# Patient Record
Sex: Female | Born: 1935 | Race: White | Hispanic: No | Smoking: Never smoker
Health system: Southern US, Community
[De-identification: ages and names within clinical notes are randomized; demographics above are authoritative.]

## PROBLEM LIST (undated history)

## (undated) DIAGNOSIS — I341 Nonrheumatic mitral (valve) prolapse: Secondary | ICD-10-CM

## (undated) DIAGNOSIS — F419 Anxiety disorder, unspecified: Secondary | ICD-10-CM

## (undated) DIAGNOSIS — G629 Polyneuropathy, unspecified: Secondary | ICD-10-CM

## (undated) DIAGNOSIS — R918 Other nonspecific abnormal finding of lung field: Secondary | ICD-10-CM

## (undated) DIAGNOSIS — K219 Gastro-esophageal reflux disease without esophagitis: Secondary | ICD-10-CM

## (undated) DIAGNOSIS — R002 Palpitations: Secondary | ICD-10-CM

## (undated) DIAGNOSIS — I10 Essential (primary) hypertension: Secondary | ICD-10-CM

## (undated) DIAGNOSIS — K649 Unspecified hemorrhoids: Secondary | ICD-10-CM

## (undated) DIAGNOSIS — M722 Plantar fascial fibromatosis: Secondary | ICD-10-CM

## (undated) DIAGNOSIS — M1990 Unspecified osteoarthritis, unspecified site: Secondary | ICD-10-CM

## (undated) DIAGNOSIS — R739 Hyperglycemia, unspecified: Secondary | ICD-10-CM

## (undated) DIAGNOSIS — Z8719 Personal history of other diseases of the digestive system: Secondary | ICD-10-CM

## (undated) DIAGNOSIS — J301 Allergic rhinitis due to pollen: Secondary | ICD-10-CM

## (undated) DIAGNOSIS — H9190 Unspecified hearing loss, unspecified ear: Secondary | ICD-10-CM

## (undated) DIAGNOSIS — G709 Myoneural disorder, unspecified: Secondary | ICD-10-CM

## (undated) HISTORY — PX: FOOT SURGERY: SHX648

## (undated) HISTORY — DX: Unspecified hemorrhoids: K64.9

## (undated) HISTORY — DX: Myoneural disorder, unspecified: G70.9

## (undated) HISTORY — PX: CATARACT EXTRACTION, BILATERAL: SHX1313

## (undated) HISTORY — DX: Personal history of other diseases of the digestive system: Z87.19

## (undated) HISTORY — DX: Gastro-esophageal reflux disease without esophagitis: K21.9

## (undated) HISTORY — DX: Essential (primary) hypertension: I10

## (undated) HISTORY — DX: Allergic rhinitis due to pollen: J30.1

## (undated) HISTORY — DX: Hyperglycemia, unspecified: R73.9

## (undated) HISTORY — DX: Unspecified osteoarthritis, unspecified site: M19.90

## (undated) HISTORY — DX: Plantar fascial fibromatosis: M72.2

## (undated) HISTORY — PX: VESICOVAGINAL FISTULA CLOSURE W/ TAH: SUR271

## (undated) HISTORY — DX: Palpitations: R00.2

## (undated) HISTORY — DX: Nonrheumatic mitral (valve) prolapse: I34.1

---
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
## 1996-03-31 HISTORY — PX: ABDOMINAL HYSTERECTOMY: SHX81

## 1998-07-26 ENCOUNTER — Other Ambulatory Visit: Admission: RE | Admit: 1998-07-26 | Discharge: 1998-07-26 | Payer: Self-pay | Admitting: Podiatry

## 1998-08-16 ENCOUNTER — Other Ambulatory Visit: Admission: RE | Admit: 1998-08-16 | Discharge: 1998-08-16 | Payer: Self-pay | Admitting: Podiatry

## 2002-12-08 ENCOUNTER — Ambulatory Visit (HOSPITAL_COMMUNITY): Admission: RE | Admit: 2002-12-08 | Discharge: 2002-12-08 | Payer: Self-pay | Admitting: *Deleted

## 2003-01-05 ENCOUNTER — Encounter: Payer: Self-pay | Admitting: *Deleted

## 2003-01-05 ENCOUNTER — Ambulatory Visit (HOSPITAL_COMMUNITY): Admission: RE | Admit: 2003-01-05 | Discharge: 2003-01-05 | Payer: Self-pay | Admitting: *Deleted

## 2004-02-06 ENCOUNTER — Ambulatory Visit: Payer: Self-pay | Admitting: *Deleted

## 2004-02-06 ENCOUNTER — Ambulatory Visit: Payer: Self-pay

## 2004-04-02 ENCOUNTER — Ambulatory Visit: Payer: Self-pay | Admitting: *Deleted

## 2004-04-23 ENCOUNTER — Ambulatory Visit: Payer: Self-pay | Admitting: *Deleted

## 2004-06-19 ENCOUNTER — Ambulatory Visit: Payer: Self-pay | Admitting: *Deleted

## 2004-07-03 ENCOUNTER — Ambulatory Visit: Payer: Self-pay | Admitting: *Deleted

## 2004-09-04 ENCOUNTER — Ambulatory Visit: Payer: Self-pay | Admitting: *Deleted

## 2004-11-15 ENCOUNTER — Emergency Department (HOSPITAL_COMMUNITY): Admission: EM | Admit: 2004-11-15 | Discharge: 2004-11-15 | Payer: Self-pay | Admitting: Emergency Medicine

## 2004-11-21 ENCOUNTER — Encounter: Admission: RE | Admit: 2004-11-21 | Discharge: 2004-11-21 | Payer: Self-pay | Admitting: Orthopaedic Surgery

## 2005-03-12 ENCOUNTER — Ambulatory Visit: Payer: Self-pay | Admitting: *Deleted

## 2005-03-13 ENCOUNTER — Ambulatory Visit: Payer: Self-pay | Admitting: *Deleted

## 2005-05-21 ENCOUNTER — Ambulatory Visit: Payer: Self-pay | Admitting: *Deleted

## 2005-09-16 ENCOUNTER — Ambulatory Visit: Payer: Self-pay | Admitting: *Deleted

## 2005-09-17 ENCOUNTER — Ambulatory Visit: Payer: Self-pay | Admitting: *Deleted

## 2006-04-20 ENCOUNTER — Ambulatory Visit: Payer: Self-pay | Admitting: *Deleted

## 2006-04-21 ENCOUNTER — Ambulatory Visit: Payer: Self-pay | Admitting: *Deleted

## 2006-04-21 LAB — CONVERTED CEMR LAB
AST: 19 units/L (ref 0–37)
Albumin: 3.7 g/dL (ref 3.5–5.2)
Alkaline Phosphatase: 60 units/L (ref 39–117)
CO2: 31 meq/L (ref 19–32)
Creatinine, Ser: 0.8 mg/dL (ref 0.4–1.2)
GFR calc Af Amer: 87 mL/min
Sodium: 141 meq/L (ref 135–145)
Total Bilirubin: 0.9 mg/dL (ref 0.3–1.2)
Triglycerides: 67 mg/dL (ref 0–149)
VLDL: 13 mg/dL (ref 0–40)

## 2006-07-08 ENCOUNTER — Ambulatory Visit: Payer: Self-pay | Admitting: Internal Medicine

## 2006-08-15 ENCOUNTER — Ambulatory Visit: Payer: Self-pay | Admitting: Family Medicine

## 2006-08-19 ENCOUNTER — Ambulatory Visit: Payer: Self-pay | Admitting: Internal Medicine

## 2006-08-25 ENCOUNTER — Ambulatory Visit: Payer: Self-pay | Admitting: Internal Medicine

## 2006-08-26 ENCOUNTER — Encounter: Admission: RE | Admit: 2006-08-26 | Discharge: 2006-08-26 | Payer: Self-pay | Admitting: Internal Medicine

## 2006-09-09 ENCOUNTER — Ambulatory Visit: Payer: Self-pay | Admitting: *Deleted

## 2006-09-10 ENCOUNTER — Ambulatory Visit: Payer: Self-pay | Admitting: *Deleted

## 2006-09-10 LAB — CONVERTED CEMR LAB
Albumin: 3.6 g/dL (ref 3.5–5.2)
Alkaline Phosphatase: 56 units/L (ref 39–117)
BUN: 17 mg/dL (ref 6–23)
GFR calc Af Amer: 121 mL/min
Potassium: 3.9 meq/L (ref 3.5–5.1)
Total CHOL/HDL Ratio: 3.3
Triglycerides: 111 mg/dL (ref 0–149)
VLDL: 22 mg/dL (ref 0–40)

## 2007-01-13 ENCOUNTER — Encounter: Payer: Self-pay | Admitting: *Deleted

## 2007-01-13 DIAGNOSIS — Z9889 Other specified postprocedural states: Secondary | ICD-10-CM | POA: Insufficient documentation

## 2007-01-13 DIAGNOSIS — Z9089 Acquired absence of other organs: Secondary | ICD-10-CM | POA: Insufficient documentation

## 2007-01-13 DIAGNOSIS — Z9079 Acquired absence of other genital organ(s): Secondary | ICD-10-CM | POA: Insufficient documentation

## 2007-01-13 DIAGNOSIS — E785 Hyperlipidemia, unspecified: Secondary | ICD-10-CM

## 2007-01-13 DIAGNOSIS — J301 Allergic rhinitis due to pollen: Secondary | ICD-10-CM | POA: Insufficient documentation

## 2007-01-13 DIAGNOSIS — Z9189 Other specified personal risk factors, not elsewhere classified: Secondary | ICD-10-CM

## 2007-01-13 DIAGNOSIS — Z8679 Personal history of other diseases of the circulatory system: Secondary | ICD-10-CM | POA: Insufficient documentation

## 2007-01-13 DIAGNOSIS — I10 Essential (primary) hypertension: Secondary | ICD-10-CM

## 2007-01-13 DIAGNOSIS — Z8719 Personal history of other diseases of the digestive system: Secondary | ICD-10-CM | POA: Insufficient documentation

## 2007-01-13 DIAGNOSIS — M129 Arthropathy, unspecified: Secondary | ICD-10-CM | POA: Insufficient documentation

## 2007-01-13 DIAGNOSIS — M722 Plantar fascial fibromatosis: Secondary | ICD-10-CM

## 2007-01-13 DIAGNOSIS — K219 Gastro-esophageal reflux disease without esophagitis: Secondary | ICD-10-CM

## 2007-01-19 ENCOUNTER — Emergency Department (HOSPITAL_COMMUNITY): Admission: EM | Admit: 2007-01-19 | Discharge: 2007-01-19 | Payer: Self-pay | Admitting: Emergency Medicine

## 2007-03-16 ENCOUNTER — Ambulatory Visit: Payer: Self-pay | Admitting: Internal Medicine

## 2007-03-16 DIAGNOSIS — S42209A Unspecified fracture of upper end of unspecified humerus, initial encounter for closed fracture: Secondary | ICD-10-CM | POA: Insufficient documentation

## 2007-09-16 ENCOUNTER — Ambulatory Visit: Payer: Self-pay | Admitting: Internal Medicine

## 2007-09-16 LAB — CONVERTED CEMR LAB
ALT: 21 units/L (ref 0–35)
Calcium: 9.5 mg/dL (ref 8.4–10.5)
Creatinine, Ser: 0.6 mg/dL (ref 0.4–1.2)
GFR calc non Af Amer: 100 mL/min
HDL: 59 mg/dL (ref >39.0)
LDL Cholesterol: 71 mg/dL (ref 0–99)
Total Bilirubin: 0.8 mg/dL (ref 0.3–1.2)
Total CHOL/HDL Ratio: 2.5
Triglycerides: 84 mg/dL (ref 0–149)

## 2007-09-17 ENCOUNTER — Encounter: Payer: Self-pay | Admitting: Internal Medicine

## 2007-09-17 ENCOUNTER — Telehealth (INDEPENDENT_AMBULATORY_CARE_PROVIDER_SITE_OTHER): Payer: Self-pay | Admitting: *Deleted

## 2007-10-06 ENCOUNTER — Telehealth: Payer: Self-pay | Admitting: Internal Medicine

## 2007-12-08 ENCOUNTER — Ambulatory Visit: Payer: Self-pay | Admitting: Internal Medicine

## 2007-12-08 DIAGNOSIS — S139XXA Sprain of joints and ligaments of unspecified parts of neck, initial encounter: Secondary | ICD-10-CM

## 2007-12-08 DIAGNOSIS — M5030 Other cervical disc degeneration, unspecified cervical region: Secondary | ICD-10-CM

## 2008-03-02 ENCOUNTER — Ambulatory Visit: Payer: Self-pay | Admitting: Internal Medicine

## 2008-03-02 LAB — CONVERTED CEMR LAB
CO2: 29 meq/L (ref 19–32)
GFR calc Af Amer: 121 mL/min
Glucose, Bld: 101 mg/dL — ABNORMAL HIGH (ref 70–99)
Potassium: 4.2 meq/L (ref 3.5–5.1)
Sodium: 136 meq/L (ref 135–145)

## 2008-03-09 ENCOUNTER — Telehealth: Payer: Self-pay | Admitting: Internal Medicine

## 2009-02-19 ENCOUNTER — Ambulatory Visit: Payer: Self-pay | Admitting: Internal Medicine

## 2009-02-19 LAB — CONVERTED CEMR LAB
ALT: 20 units/L (ref 0–35)
AST: 19 units/L (ref 0–37)
Albumin: 4.3 g/dL (ref 3.5–5.2)
CO2: 31 meq/L (ref 19–32)
Calcium: 9.2 mg/dL (ref 8.4–10.5)
Cholesterol: 151 mg/dL (ref 0–200)
GFR calc non Af Amer: 99.6 mL/min (ref >60)
HDL: 72 mg/dL (ref >39.00)
Sodium: 134 meq/L — ABNORMAL LOW (ref 135–145)
Total CHOL/HDL Ratio: 2
Total Protein: 7 g/dL (ref 6.0–8.3)
Triglycerides: 41 mg/dL (ref 0.0–149.0)

## 2009-08-23 ENCOUNTER — Encounter: Payer: Self-pay | Admitting: Internal Medicine

## 2009-09-04 ENCOUNTER — Ambulatory Visit: Payer: Self-pay | Admitting: Endocrinology

## 2009-09-04 DIAGNOSIS — N390 Urinary tract infection, site not specified: Secondary | ICD-10-CM

## 2009-09-04 LAB — CONVERTED CEMR LAB
Glucose, Urine, Semiquant: NEGATIVE
Ketones, urine, test strip: NEGATIVE
Urobilinogen, UA: 0.2
pH: 5

## 2010-02-14 ENCOUNTER — Ambulatory Visit: Payer: Self-pay | Admitting: Internal Medicine

## 2010-02-14 DIAGNOSIS — M25569 Pain in unspecified knee: Secondary | ICD-10-CM

## 2010-02-27 ENCOUNTER — Ambulatory Visit: Payer: Self-pay | Admitting: Internal Medicine

## 2010-03-04 ENCOUNTER — Telehealth: Payer: Self-pay | Admitting: Internal Medicine

## 2010-03-07 ENCOUNTER — Telehealth (INDEPENDENT_AMBULATORY_CARE_PROVIDER_SITE_OTHER): Payer: Self-pay | Admitting: *Deleted

## 2010-03-11 ENCOUNTER — Encounter: Payer: Self-pay | Admitting: Internal Medicine

## 2010-04-08 ENCOUNTER — Encounter: Payer: Self-pay | Admitting: Internal Medicine

## 2010-04-30 NOTE — Letter (Signed)
Summary: BP note from patient  BP note from patient   Imported By: Lester [place] 08/29/2009 10:34:04  _____________________________________________________________________  External Attachment:    Type:   Image     Comment:   External Document

## 2010-04-30 NOTE — Assessment & Plan Note (Signed)
Summary: severe pain in both knees/norins/lb   Vital Signs:  Patient profile:   77 year old female Height:      64 inches Weight:      172 pounds BMI:     29.63 Temp:     98.6 degrees F oral Pulse rate:   96 / minute Pulse rhythm:   regular Resp:     16 per minute BP sitting:   140 / 84  (left arm) Cuff size:   regular  Vitals Entered By: Lanier Prude, CMA(AAMA) (February 14, 2010 10:04 AM) CC: bilateral knee pain X 2 wks Is Patient Diabetic? No Comments pt is requesting Prednisone    Primary Care Ximenna Fonseca:  Norins  CC:  bilateral knee pain X 2 wks.  History of Present Illness: C/o B knee pain x 3 wks after exercising her legs for a while Asking for Prednisone Rx - it  helped her before  Current Medications (verified): 1)  Toprol Xl 100 Mg  Tb24 (Metoprolol Succinate) .... Take One Tablet Once Daily 2)  Hydrochlorothiazide 25 Mg  Tabs (Hydrochlorothiazide) .Marland Kitchen.. 1 By Mouth Once Daily 3)  Klor-Con M20 20 Meq  Tbcr (Potassium Chloride Crys Cr) .... Take 1 Tablet Once Daily 4)  Flax Seed Oil 1000 Mg  Caps (Flaxseed (Linseed)) .... Take One Tablet Once Daily 5)  Lipitor 40 Mg  Tabs (Atorvastatin Calcium) .... Take One Tablet Once Daily 6)  Centrum Silver   Tabs (Multiple Vitamins-Minerals) .... Take One Tablet Once Daily 7)  Unisom 25 Mg  Tabs (Doxylamine Succinate (Sleep)) .... Take One Tablet At Bedtime 8)  Prilosec Otc 20 Mg  Tbec (Omeprazole Magnesium) .... Take As Needed 9)  Nasacort Aq 55 Mcg/act  Aers (Triamcinolone Acetonide(Nasal)) .... Use As Needed 10)  Triflora Arthritis Gel .... Use As Needed 11)  Zetia 10 Mg Tabs (Ezetimibe) .... Take 1 Tablet By Mouth Once A Day  Allergies (verified): No Known Drug Allergies  Past History:  Past Medical History: Last updated: 01/13/2007 HIATAL HERNIA, HX OF (ICD-V12.79) MITRAL VALVE PROLAPSE, HX OF (ICD-V12.50) ARTHRITIS (ICD-716.90) HYPERLIPIDEMIA (ICD-272.4) PLANTAR FASCIITIS (ICD-728.71) HAY FEVER  (ICD-477.0) GERD (ICD-530.81) PALPITATIONS, HX OF (ICD-V12.50) HYPERTENSION (ICD-401.9)    Social History: Last updated: 03/16/2007 married 47 years- widowed 1996 1 son, 1 daughter 2 grandsons.  Lives in her own home, son lives with her. Tries to walk every day. Starting to get out more after ortho injury. I-ADLs. End-of-life issues: DNR and no heroic or extraordinary measures.  Review of Systems  The patient denies fever.    Physical Exam  General:  alert, well-developed, well-nourished, well-hydrated, and healthy-appearing.   Lungs:  normal respiratory effort, normal breath sounds, no crackles, and no wheezes.   Heart:  normal rate, regular rhythm, no murmur, and no JVD.   Abdomen:  soft and normal bowel sounds.   Msk:  Mild B knee joint tenderness, no joint swelling, no joint warmth, no redness over joints, and no joint deformities.   Neurologic:  alert & oriented X3, cranial nerves II-XII intact, and strength normal in all extremities.  Patient is a little stiff in her movements without tremor.    Impression & Recommendations:  Problem # 1:  KNEE PAIN (ICD-719.46) OA and MSK strain B Assessment New Take Prednisone 30mg  qd for 2 days, then 20 mg qd for 3 days, then 10mg  qd for 6 days, then stop. Take pc.   Problem # 2:  GERD (ICD-530.81) Assessment: Comment Only  Her updated medication list for  this problem includes:    Prilosec Otc 20 Mg Tbec (Omeprazole magnesium) .Marland Kitchen... Take as needed  Complete Medication List: 1)  Toprol Xl 100 Mg Tb24 (Metoprolol succinate) .... Take one tablet once daily 2)  Hydrochlorothiazide 25 Mg Tabs (Hydrochlorothiazide) .Marland Kitchen.. 1 by mouth once daily 3)  Klor-con M20 20 Meq Tbcr (Potassium chloride crys cr) .... Take 1 tablet once daily 4)  Flax Seed Oil 1000 Mg Caps (Flaxseed (linseed)) .... Take one tablet once daily 5)  Lipitor 40 Mg Tabs (Atorvastatin calcium) .... Take one tablet once daily 6)  Centrum Silver Tabs (Multiple  vitamins-minerals) .... Take one tablet once daily 7)  Unisom 25 Mg Tabs (Doxylamine succinate (sleep)) .... Take one tablet at bedtime 8)  Prilosec Otc 20 Mg Tbec (Omeprazole magnesium) .... Take as needed 9)  Nasacort Aq 55 Mcg/act Aers (Triamcinolone acetonide(nasal)) .... Use as needed 10)  Triflora Arthritis Gel  .... Use as needed 11)  Zetia 10 Mg Tabs (Ezetimibe) .... Take 1 tablet by mouth once a day 12)  Prednisone 10 Mg Tabs (Prednisone) .... Take 30mg  qd for 3 days, then 20 mg qd for 3 days, then 10mg  qd for 6 days, then stop. take pc.  Patient Instructions: 1)  Take Prednisone with Prilosec 2)  Please schedule a follow-up appointment in 2 weeks Dr Debby Bud. Prescriptions: PREDNISONE 10 MG TABS (PREDNISONE) Take 30mg  qd for 3 days, then 20 mg qd for 3 days, then 10mg  qd for 6 days, then stop. Take pc.  #21 x 0   Entered and Authorized by:   Tresa Garter MD   Signed by:   Tresa Garter MD on 02/14/2010   Method used:   [facility]        [facility] [address] [phone][facility] (retail)       [address]       [place], [place]  [address]       Ph: [phone] or [phone]       Fax: [phone]   RxID:   [id number]    Orders Added: 1)  Est. Patient Level III [26712]

## 2010-04-30 NOTE — Assessment & Plan Note (Signed)
Summary: UTI /NWS   MEN'S PT   Vital Signs:  Patient profile:   77 year old female Height:      64 inches (162.56 cm) Weight:      173.8 pounds (79 kg) BMI:     29.94 O2 Sat:      96 % on Room air Temp:     98.2 degrees F (36.78 degrees C) oral Pulse rate:   85 / minute BP sitting:   140 / 86  (left arm) Cuff size:   regular  Vitals Entered By: Orlan Leavens (September 04, 2009 1:32 PM)  O2 Flow:  Room air CC: ? UTI Is Patient Diabetic? No   Primary Provider:  Norins  CC:  ? UTI.  History of Present Illness: pt states few days of slight dysuria sensation at the urethra.  denies assoc low-back pain.  Current Medications (verified): 1)  Toprol Xl 100 Mg  Tb24 (Metoprolol Succinate) .... Take One Tablet Once Daily 2)  Hydrochlorothiazide 25 Mg  Tabs (Hydrochlorothiazide) .Marland Kitchen.. 1 By Mouth Once Daily 3)  Klor-Con M20 20 Meq  Tbcr (Potassium Chloride Crys Cr) .... Take 1 Tablet Once Daily 4)  Flax Seed Oil 1000 Mg  Caps (Flaxseed (Linseed)) .... Take One Tablet Once Daily 5)  Lipitor 40 Mg  Tabs (Atorvastatin Calcium) .... Take One Tablet Once Daily 6)  Centrum Silver   Tabs (Multiple Vitamins-Minerals) .... Take One Tablet Once Daily 7)  Unisom 25 Mg  Tabs (Doxylamine Succinate (Sleep)) .... Take One Tablet At Bedtime 8)  Prilosec Otc 20 Mg  Tbec (Omeprazole Magnesium) .... Take As Needed 9)  Nasacort Aq 55 Mcg/act  Aers (Triamcinolone Acetonide(Nasal)) .... Use As Needed 10)  Triflora Arthritis Gel .... Use As Needed 11)  Zetia 10 Mg Tabs (Ezetimibe) .... Take 1 Tablet By Mouth Once A Day  Allergies (verified): No Known Drug Allergies  Past History:  Past Medical History: Last updated: 01/13/2007 HIATAL HERNIA, HX OF (ICD-V12.79) MITRAL VALVE PROLAPSE, HX OF (ICD-V12.50) ARTHRITIS (ICD-716.90) HYPERLIPIDEMIA (ICD-272.4) PLANTAR FASCIITIS (ICD-728.71) HAY FEVER (ICD-477.0) GERD (ICD-530.81) PALPITATIONS, HX OF (ICD-V12.50) HYPERTENSION (ICD-401.9)    Review of  Systems  The patient denies fever.    Physical Exam  General:  no distress elderly, no distress Abdomen:  no suprapubic tenderness.   Impression & Recommendations:  Problem # 1:  UTI (ICD-599.0) Assessment New  Medications Added to Medication List This Visit: 1)  Ciprofloxacin Hcl 250 Mg Tabs (Ciprofloxacin hcl) .Marland Kitchen.. 1 tab two times a day  Other Orders: UA Dipstick w/o Micro (manual) (81191) T-Urine Culture (Spectrum Order) [id number]) Est. Patient Level III (08657)  Patient Instructions: 1)  cipro 250 mg two times a day 2)  return next week if not better Prescriptions: CIPROFLOXACIN HCL 250 MG TABS (CIPROFLOXACIN HCL) 1 tab two times a day  #14 x 0   Entered and Authorized by:   Minus Breeding MD   Signed by:   Minus Breeding MD on 09/04/2009   Method used:   [facility] to        [facility] [address] [phone]* (retail)       [address]       [place], [place]  [address]       Ph: [phone] or [phone]       Fax: [phone]   RxID:   [id number]   Laboratory Results   Urine Tests    Routine Urinalysis  Color: colorless Appearance: Clear Glucose: negative   (Normal Range: Negative) Bilirubin: negative   (Normal Range: Negative) Ketone: negative   (Normal Range: Negative) Spec. Gravity: <1.005   (Normal Range: 1.003-1.035) Blood: moderate   (Normal Range: Negative) pH: 5.0   (Normal Range: 5.0-8.0) Protein: negative   (Normal Range: Negative) Urobilinogen: 0.2   (Normal Range: 0-1) Nitrite: negative   (Normal Range: Negative) Leukocyte Esterace: large   (Normal Range: Negative)

## 2010-04-30 NOTE — Progress Notes (Signed)
  Phone Note Call from Patient   Summary of Call: Meloxicam has given little relief and pt would like referral to ortho.  Initial call taken by: Lamar Sprinkles, CMA,  March 04, 2010 3:12 PM  Follow-up for Phone Call        will refer to Dr. Madelon Lips. [facility] notified Follow-up by: Jacques Navy MD,  March 04, 2010 4:26 PM  Additional Follow-up for Phone Call Additional follow up Details #1::        Left vm that referral was in process Additional Follow-up by: Lamar Sprinkles, CMA,  March 04, 2010 5:55 PM

## 2010-04-30 NOTE — Assessment & Plan Note (Signed)
Summary: knee pain/cd   Vital Signs:  Patient profile:   77 year old female Height:      64 inches Weight:      172 pounds BMI:     29.63 O2 Sat:      98 % on Room air Temp:     98.2 degrees F oral Pulse rate:   80 / minute BP sitting:   132 / 80  (left arm) Cuff size:   regular  Vitals Entered By: Bill Salinas CMA (February 27, 2010 9:50 AM)  O2 Flow:  Room air CC: pt here with c/o pain and soreness in her left knee. Pt states symptoms were getting better but shortly after finished prednisone her symptoms came back/ ab   Primary Care Provider:  Norins  CC:  pt here with c/o pain and soreness in her left knee. Pt states symptoms were getting better but shortly after finished prednisone her symptoms came back/ ab.  History of Present Illness: Patient was seen by Dr. Roena Malady for a painful left knee. She was placed on a prednisone urst and taper. This did reduce the pain. she reports that having finished the prednisone she is having some recurrent pain. She also has swelling in the popliteal fossa. She is able to ambulate without difficulty.  Current Medications (verified): 1)  Toprol Xl 100 Mg  Tb24 (Metoprolol Succinate) .... Take One Tablet Once Daily 2)  Hydrochlorothiazide 25 Mg  Tabs (Hydrochlorothiazide) .Marland Kitchen.. 1 By Mouth Once Daily 3)  Klor-Con M20 20 Meq  Tbcr (Potassium Chloride Crys Cr) .... Take 1 Tablet Once Daily 4)  Flax Seed Oil 1000 Mg  Caps (Flaxseed (Linseed)) .... Take One Tablet Once Daily 5)  Lipitor 40 Mg  Tabs (Atorvastatin Calcium) .... Take One Tablet Once Daily 6)  Centrum Silver   Tabs (Multiple Vitamins-Minerals) .... Take One Tablet Once Daily 7)  Unisom 25 Mg  Tabs (Doxylamine Succinate (Sleep)) .... Take One Tablet At Bedtime 8)  Prilosec Otc 20 Mg  Tbec (Omeprazole Magnesium) .... Take As Needed 9)  Nasacort Aq 55 Mcg/act  Aers (Triamcinolone Acetonide(Nasal)) .... Use As Needed 10)  Triflora Arthritis Gel .... Use As Needed 11)  Zetia 10 Mg Tabs  (Ezetimibe) .... Take 1 Tablet By Mouth Once A Day 12)  Prednisone 10 Mg Tabs (Prednisone) .... Take 30mg  Qd For 3 Days, Then 20 Mg Qd For 3 Days, Then 10mg  Qd For 6 Days, Then Stop. Take Pc.  Allergies (verified): No Known Drug Allergies PMH-FH-SH reviewed-no changes except otherwise noted  Review of Systems       The patient complains of decreased hearing.  The patient denies weight loss, abdominal pain, muscle weakness, difficulty walking, and enlarged lymph nodes.    Physical Exam  General:  Well-developed,well-nourished,in no acute distress; alert,appropriate and cooperative throughout examination Eyes:  pupils equal and pupils round.   Lungs:  normal respiratory effort.   Heart:  normal rate and regular rhythm.   Msk:  left knee with normal ROM. No tenderness along the joint line. Mild crepitus noted. There is a soft bulge in the popliteal fossa that is not tender. Pulses:  2+ radial   Impression & Recommendations:  Problem # 1:  KNEE PAIN (ICD-719.46) Probable mild OA left knee now with a Baker's cyst on exam.   Plan - CareNotes handout on Baker's cyst.           may use meloxicam 15mg  once daily for pain  if worse - refer to ortho.  Her updated medication list for this problem includes:    Meloxicam 15 Mg Tabs (Meloxicam) .Marland Kitchen... 1 by mouth once daily as needed knee pain  Complete Medication List: 1)  Toprol Xl 100 Mg Tb24 (Metoprolol succinate) .... Take one tablet once daily 2)  Hydrochlorothiazide 25 Mg Tabs (Hydrochlorothiazide) .Marland Kitchen.. 1 by mouth once daily 3)  Klor-con M20 20 Meq Tbcr (Potassium chloride crys cr) .... Take 1 tablet once daily 4)  Flax Seed Oil 1000 Mg Caps (Flaxseed (linseed)) .... Take one tablet once daily 5)  Lipitor 40 Mg Tabs (Atorvastatin calcium) .... Take one tablet once daily 6)  Centrum Silver Tabs (Multiple vitamins-minerals) .... Take one tablet once daily 7)  Unisom 25 Mg Tabs (Doxylamine succinate (sleep)) .... Take one tablet  at bedtime 8)  Prilosec Otc 20 Mg Tbec (Omeprazole magnesium) .... Take as needed 9)  Nasacort Aq 55 Mcg/act Aers (Triamcinolone acetonide(nasal)) .... Use as needed 10)  Triflora Arthritis Gel  .... Use as needed 11)  Zetia 10 Mg Tabs (Ezetimibe) .... Take 1 tablet by mouth once a day 12)  Meloxicam 15 Mg Tabs (Meloxicam) .Marland Kitchen.. 1 by mouth once daily as needed knee pain  Patient Instructions: 1)  knee pain - mild arthritis with some accumulation of fluid in the bursa surrounding the joint -= Baker's cyst. Plan - may take meloxicam 15mg  once a day as needed for knee pain. IF the pain increases or the cyst enlarges will refer to orthopedics.  Prescriptions: MELOXICAM 15 MG TABS (MELOXICAM) 1 by mouth once daily as needed knee pain  #30 x 2   Entered and Authorized by:   Jacques Navy MD   Signed by:   Jacques Navy MD on 02/27/2010   Method used:   Electronically to        [facility] [address] [phone]* (retail)       [address]       [place], [place]  [address]       Ph: [phone] or [phone]       Fax: [phone]   RxID:   [id number]    Orders Added: 1)  Est. Patient Level III [51884]

## 2010-05-02 NOTE — Letter (Signed)
Summary: [facility]  [facility]   Imported By: Lennie Odor 03/18/2010 12:04:03  _____________________________________________________________________  External Attachment:    Type:   Image     Comment:   External Document

## 2010-05-02 NOTE — Progress Notes (Signed)
  Phone Note Call from Patient Call back at Home Phone [phone]   Caller: Son Reason for Call: Talk to Nurse Summary of Call: Requsting status on the referral to see orthopedic Initial call taken by: Orlan Leavens RMA,  March 07, 2010 9:18 AM  Follow-up for Phone Call        Appt Scheduled Phone Call Completed- [facility] -Dr. Kristeen Miss  dec 12,2011@10 :00 -pt husband  informed Shelbie Proctor  March 07, 2010 10:38 AM  Follow-up by: Shelbie Proctor,  March 07, 2010 10:37 AM

## 2010-05-02 NOTE — Letter (Signed)
Summary: [facility]  [facility]   Imported By: Sherian Rein 04/25/2010 11:13:40  _____________________________________________________________________  External Attachment:    Type:   Image     Comment:   External Document

## 2010-05-22 ENCOUNTER — Other Ambulatory Visit: Payer: Self-pay | Admitting: Orthopedic Surgery

## 2010-05-22 DIAGNOSIS — M25569 Pain in unspecified knee: Secondary | ICD-10-CM

## 2010-05-28 ENCOUNTER — Ambulatory Visit
Admission: RE | Admit: 2010-05-28 | Discharge: 2010-05-28 | Disposition: A | Discharge status: Home / Self Care | Payer: Medicare Other | Source: Ambulatory Visit | Attending: Orthopedic Surgery | Admitting: Orthopedic Surgery

## 2010-05-28 DIAGNOSIS — M25569 Pain in unspecified knee: Secondary | ICD-10-CM

## 2010-06-05 ENCOUNTER — Telehealth: Payer: Self-pay | Admitting: Internal Medicine

## 2010-06-11 ENCOUNTER — Ambulatory Visit (INDEPENDENT_AMBULATORY_CARE_PROVIDER_SITE_OTHER): Payer: Medicare Other | Admitting: Internal Medicine

## 2010-06-11 ENCOUNTER — Encounter: Payer: Self-pay | Admitting: Internal Medicine

## 2010-06-11 ENCOUNTER — Other Ambulatory Visit: Payer: Medicare Other

## 2010-06-11 ENCOUNTER — Other Ambulatory Visit: Payer: Self-pay | Admitting: Internal Medicine

## 2010-06-11 DIAGNOSIS — T887XXA Unspecified adverse effect of drug or medicament, initial encounter: Secondary | ICD-10-CM

## 2010-06-11 DIAGNOSIS — M25569 Pain in unspecified knee: Secondary | ICD-10-CM

## 2010-06-11 DIAGNOSIS — Z8679 Personal history of other diseases of the circulatory system: Secondary | ICD-10-CM

## 2010-06-11 DIAGNOSIS — I10 Essential (primary) hypertension: Secondary | ICD-10-CM

## 2010-06-11 LAB — CBC WITH DIFFERENTIAL/PLATELET
Eosinophils Relative: 3 % (ref 0.0–5.0)
HCT: 41.6 % (ref 36.0–46.0)
Hemoglobin: 14.2 g/dL (ref 12.0–15.0)
Lymphocytes Relative: 23 % (ref 12.0–46.0)
Lymphs Abs: 1.7 10*3/uL (ref 0.7–4.0)
Monocytes Relative: 9.7 % (ref 3.0–12.0)
Neutro Abs: 4.6 10*3/uL (ref 1.4–7.7)
WBC: 7.3 10*3/uL (ref 4.5–10.5)

## 2010-06-11 LAB — BASIC METABOLIC PANEL
Calcium: 9.5 mg/dL (ref 8.4–10.5)
GFR: 83.12 mL/min (ref >60.00)
Potassium: 4.3 mEq/L (ref 3.5–5.1)
Sodium: 134 mEq/L — ABNORMAL LOW (ref 135–145)

## 2010-06-11 NOTE — Progress Notes (Signed)
Summary: FYI Pain med  Phone Note Refill Request Call back at Home Phone [phone]   Refills Requested: Medication #1:  Hydrocodone-Acetaminophen   Supply Requested: 1 month   Notes: SIG: Take (1) Q6hrs as needed for knee pain #90x0 [facility] [phone]   Method Requested: Telephone to Pharmacy Initial call taken by: Burnard Leigh [facility]),  June 05, 2010 2:25 PM Caller: Son/Clyde [phone] Summary of Call: Caller (son) states that Pt is having great amount of knee pain and would like medication to be prescribed. Initial call taken by: Burnard Leigh [facility]),  June 05, 2010 2:12 PM  Follow-up for Phone Call        Per VO Dr Debby Bud, after reviewing Pt's chart; 3 options to relay to caller: 1)Make appt to see Ortho Surgeon for arthroscopic debridement; too soon for steroid injection at this time 2)Continue using Meloxicam for knee pain 3)Call in Rx for Hydrocodone-Acetaminophen 500-325mg  (1)Q6hrs Prn for knee pain #90x0 Follow-up by: Burnard Leigh [facility]),  June 05, 2010 2:15 PM  Additional Follow-up for Phone Call Additional follow up Details #1::        Informed Pt's son (and attempted to speak w/Pt via telephone but she could not understand due to hearing loss) of options given by Dr Debby Bud. Pt will try Hydrocodone-Acetaminophen ([facility] @[phone]) and F/U w/our office on status. Informed Pt's son of standard narcotic protocol/precautions and informed to purchase laxative and begin use to avoid constipation and/or bowel issues. Also reiterated to pharmacy to go over protocol/precautions when med is picked up.  patient's son had called: she is having terrible knee pain. Reviewed notes from Dr. Carola Frost - she needs arthroscopy to help relieve her knee pain and is advised to have this done.  For pain will Rx- hydrocodone/APAP 5/325 1 q6  Additional Follow-up by: Burnard Leigh [facility]),  June 05, 2010 2:23 PM      New/Updated Medications: HYDROCODONE-ACETAMINOPHEN 5-325 MG TABS (HYDROCODONE-ACETAMINOPHEN) 1 by mouth every six hours as needed for pain Prescriptions: HYDROCODONE-ACETAMINOPHEN 5-325 MG TABS (HYDROCODONE-ACETAMINOPHEN) 1 by mouth every six hours as needed for pain  #90 x 0   Entered by:   Burnard Leigh CMA(AAMA)   Authorized by:   Jacques Navy MD   Signed by:   Burnard Leigh CMA(AAMA) on 06/05/2010   Method used:   Telephoned to ...       [facility] [address] [phone]* (retail)       [address]       [place], [place]  [address]       Ph: [phone] or [phone]       Fax: [phone]   RxID:   [id number]

## 2010-06-18 NOTE — Assessment & Plan Note (Signed)
Summary: SURGICAL CLEARANCE /NWS   Vital Signs:  Patient profile:   77 year old female Height:      64 inches Weight:      161 pounds BMI:     27.74 O2 Sat:      97 % on Room air Temp:     98.5 degrees F oral Pulse rate:   77 / minute BP sitting:   158 / 88  (right arm) Cuff size:   regular  Vitals Entered By: Bill Salinas CMA (June 11, 2010 9:00 AM)  O2 Flow:  Room air CC: pt here for yearly and surgical clearence/ ab  Vision Screening:      Vision Comments: pt due for eye exam   Primary Care Provider:  Norins  CC:  pt here for yearly and surgical clearence/ ab.  History of Present Illness: [name] presents for preopeerative clearance. She has had chronic pain in the left knee and has had several steroid injections. Dr. Magdalene Patricia notes reviewed. She at this time is a candidate for arthroscopic surgery left knee. She does c/o pain in the knee and difficulty with walking, although she is not using a cane or other assist.  Her health has been generally good. She has had no acute medical illness or cardiac illness for 6months or more.   Preventive Screening-Counseling & Management  Alcohol-Tobacco     Alcohol drinks/day: 0     Smoking Status: never  Caffeine-Diet-Exercise     Caffeine use/day: 2 cups per day     Does Patient Exercise: no  Hep-HIV-STD-Contraception     Dental Visit-last 6 months yes     Sun Exposure-Excessive: no  Safety-Violence-Falls     Seat Belt Use: yes     Helmet Use: n/a     Firearms in the Home: no firearms in the home     Smoke Detectors: no     Violence in the Home: no risk noted     Sexual Abuse: no     Fall Risk: slight fall risk      Blood Transfusions:  no.    Current Medications (verified): 1)  Toprol Xl 100 Mg  Tb24 (Metoprolol Succinate) .... Take One Tablet Once Daily 2)  Hydrochlorothiazide 25 Mg  Tabs (Hydrochlorothiazide) .Marland Kitchen.. 1 By Mouth Once Daily 3)  Klor-Con M20 20 Meq  Tbcr (Potassium Chloride Crys Cr) ....  Take 1 Tablet Once Daily 4)  Flax Seed Oil 1000 Mg  Caps (Flaxseed (Linseed)) .... Take One Tablet Once Daily 5)  Lipitor 40 Mg  Tabs (Atorvastatin Calcium) .... Take One Tablet Once Daily 6)  Centrum Silver   Tabs (Multiple Vitamins-Minerals) .... Take One Tablet Once Daily 7)  Unisom 25 Mg  Tabs (Doxylamine Succinate (Sleep)) .... Take One Tablet At Bedtime 8)  Prilosec Otc 20 Mg  Tbec (Omeprazole Magnesium) .... Take As Needed 9)  Nasacort Aq 55 Mcg/act  Aers (Triamcinolone Acetonide(Nasal)) .... Use As Needed 10)  Triflora Arthritis Gel .... Use As Needed 11)  Zetia 10 Mg Tabs (Ezetimibe) .... Take 1 Tablet By Mouth Once A Day 12)  Meloxicam 15 Mg Tabs (Meloxicam) .Marland Kitchen.. 1 By Mouth Once Daily As Needed Knee Pain 13)  Hydrocodone-Acetaminophen 5-325 Mg Tabs (Hydrocodone-Acetaminophen) .Marland Kitchen.. 1 By Mouth Every Six Hours As Needed For Pain  Allergies (verified): No Known Drug Allergies  Past History:  Past Medical History: Last updated: 01/13/2007 HIATAL HERNIA, HX OF (ICD-V12.79) MITRAL VALVE PROLAPSE, HX OF (ICD-V12.50) ARTHRITIS (ICD-716.90) HYPERLIPIDEMIA (ICD-272.4) PLANTAR FASCIITIS (ICD-728.71)  HAY FEVER (ICD-477.0) GERD (ICD-530.81) PALPITATIONS, HX OF (ICD-V12.50) HYPERTENSION (ICD-401.9)    Past Surgical History: Last updated: 01/13/2007 BLADDER SUSPENSION, HX OF (ICD-V45.89) FOOT SURGERY, HX OF (ICD-V15.89) TONSILLECTOMY, HX OF (ICD-V45.79) HYSTERECTOMY, HX OF (ICD-V45.77)  Family History: Last updated: 03/16/2007 non-contributory in nanogenarian  Social History: Last updated: 03/16/2007 married 47 years- widowed 1996 1 son, 1 daughter 2 grandsons.  Lives in her own home, son lives with her. Tries to walk every day. Starting to get out more after ortho injury. I-ADLs. End-of-life issues: DNR and no heroic or extraordinary measures.  Social History: Caffeine use/day:  2 cups per day Does Patient Exercise:  no Dental Care w/in 6 mos.:  yes Sun  Exposure-Excessive:  no Seat Belt Use:  yes Fall Risk:  slight fall risk Blood Transfusions:  no  Review of Systems       The patient complains of difficulty walking.  The patient denies anorexia, fever, weight loss, weight gain, vision loss, decreased hearing, chest pain, syncope, dyspnea on exertion, prolonged cough, abdominal pain, severe indigestion/heartburn, incontinence, suspicious skin lesions, depression, abnormal bleeding, and enlarged lymph nodes.    Physical Exam  General:  A very pleasant white woman who looks younger than her stated age is HOH but is otherwise dong well Head:  Normocephalic and atraumatic without obvious abnormalities. No apparent alopecia or balding. Eyes:  vision grossly intact, pupils equal, and pupils round.   Ears:  External ear exam shows no significant lesions or deformities.  Otoscopic examination reveals clear canals, tympanic membranes are intact bilaterally without bulging, retraction, inflammation or discharge. She is very hard of hearing Nose:  no external deformity and no external erythema.   Mouth:  no oral lesions, throat is clear Neck:  supple and full ROM.   Breasts:  deferred Lungs:  normal respiratory effort, normal breath sounds, no crackles, and no wheezes.   Heart:  normal rate and regular rhythm.   Abdomen:  soft and non-tender.   Msk:  Left knee is enlarged but without erythema, effusion, heat. she does have pain with movement and mild crepitis. I did not perceive a click. Pulses:  2+ radial pulses Extremities:  No clubbing, cyanosis, edema, or deformity noted with normal full range of motion of all joints.   Neurologic:  alert & oriented X3, cranial nerves II-XII intact except for hearing loss, strength normal in all extremities, and gait normal.   Skin:  turgor normal, color normal, and no suspicious lesions.   Cervical Nodes:  no anterior cervical adenopathy and no posterior cervical adenopathy.   Psych:  Oriented X3, memory  intact for recent and remote, normally interactive, good eye contact, and not anxious appearing.     Impression & Recommendations:  Problem # 1:  MITRAL VALVE PROLAPSE, HX OF (ICD-V12.50) [name] generally appears fit. She does have a h/o MVP.  Plan - 2 D echo to assess MVP as well as LV function.  Problem # 2:  HYPERTENSION (ICD-401.9)  Her updated medication list for this problem includes:    Toprol Xl 100 Mg Tb24 (Metoprolol succinate) .Marland Kitchen... Take one tablet once daily    Hydrochlorothiazide 25 Mg Tabs (Hydrochlorothiazide) .Marland Kitchen... 1 by mouth once daily  Orders: TLB-CBC Platelet - w/Differential (85025-CBCD)  BP today: 158/88 Prior BP: 132/80 (02/27/2010)  Prior 10 Yr Risk Heart Disease: 9 % (03/02/2008)  Labs Reviewed: K+: 4.1 (02/19/2009) Creat: : 0.6 (02/19/2009)   Chol: 151 (02/19/2009)   HDL: 72.00 (02/19/2009)   LDL: 71 (02/19/2009)  TG: 41.0 (02/19/2009)  Good control of BP most of the time. She will continue her present medications.  Problem # 3:  KNEE PAIN (ICD-719.46) Chronic knee pain. she is not a candidate for TKR given her advanced age. However, she is a candidate for arthorscopic intervention. She has the risk of age but otherwise is medically stable.   As above will check 2 D echo to assess MVP as well as LV function.  If this is a normal study she is cleared for surgery and anesthesia. will not repeat labs but defer to usual preop labs.  Her updated medication list for this problem includes:    Meloxicam 15 Mg Tabs (Meloxicam) .Marland Kitchen... 1 by mouth once daily as needed knee pain    Hydrocodone-acetaminophen 5-325 Mg Tabs (Hydrocodone-acetaminophen) .Marland Kitchen... 1 by mouth every six hours as needed for pain  Complete Medication List: 1)  Toprol Xl 100 Mg Tb24 (Metoprolol succinate) .... Take one tablet once daily 2)  Hydrochlorothiazide 25 Mg Tabs (Hydrochlorothiazide) .Marland Kitchen.. 1 by mouth once daily 3)  Klor-con M20 20 Meq Tbcr (Potassium chloride crys cr) .... Take  1 tablet once daily 4)  Flax Seed Oil 1000 Mg Caps (Flaxseed (linseed)) .... Take one tablet once daily 5)  Lipitor 40 Mg Tabs (Atorvastatin calcium) .... Take one tablet once daily 6)  Centrum Silver Tabs (Multiple vitamins-minerals) .... Take one tablet once daily 7)  Unisom 25 Mg Tabs (Doxylamine succinate (sleep)) .... Take one tablet at bedtime 8)  Prilosec Otc 20 Mg Tbec (Omeprazole magnesium) .... Take as needed 9)  Nasacort Aq 55 Mcg/act Aers (Triamcinolone acetonide(nasal)) .... Use as needed 10)  Triflora Arthritis Gel  .... Use as needed 11)  Zetia 10 Mg Tabs (Ezetimibe) .... Take 1 tablet by mouth once a day 12)  Meloxicam 15 Mg Tabs (Meloxicam) .Marland Kitchen.. 1 by mouth once daily as needed knee pain 13)  Hydrocodone-acetaminophen 5-325 Mg Tabs (Hydrocodone-acetaminophen) .Marland Kitchen.. 1 by mouth every six hours as needed for pain  Other Orders: TLB-BMP (Basic Metabolic Panel-BMET) (80048-METABOL) Cardiology Referral (Cardiology)   Orders Added: 1)  TLB-BMP (Basic Metabolic Panel-BMET) [80048-METABOL] 2)  TLB-CBC Platelet - w/Differential [85025-CBCD] 3)  Cardiology Referral [Cardiology] 4)  Est. Patient Level IV [54098]

## 2010-06-21 ENCOUNTER — Other Ambulatory Visit (HOSPITAL_COMMUNITY): Payer: Self-pay | Admitting: Internal Medicine

## 2010-06-21 DIAGNOSIS — I341 Nonrheumatic mitral (valve) prolapse: Secondary | ICD-10-CM

## 2010-06-24 ENCOUNTER — Ambulatory Visit (HOSPITAL_COMMUNITY): Payer: Medicare Other | Attending: Internal Medicine | Admitting: Radiology

## 2010-06-24 DIAGNOSIS — Z01810 Encounter for preprocedural cardiovascular examination: Secondary | ICD-10-CM | POA: Insufficient documentation

## 2010-06-24 DIAGNOSIS — I341 Nonrheumatic mitral (valve) prolapse: Secondary | ICD-10-CM

## 2010-06-24 DIAGNOSIS — I059 Rheumatic mitral valve disease, unspecified: Secondary | ICD-10-CM | POA: Insufficient documentation

## 2010-06-24 DIAGNOSIS — I10 Essential (primary) hypertension: Secondary | ICD-10-CM | POA: Insufficient documentation

## 2010-06-24 DIAGNOSIS — E785 Hyperlipidemia, unspecified: Secondary | ICD-10-CM | POA: Insufficient documentation

## 2010-06-25 ENCOUNTER — Telehealth: Payer: Self-pay | Admitting: Internal Medicine

## 2010-06-25 NOTE — Telephone Encounter (Signed)
Please notify patient that her 2 D echo was normal: good heart function and no significant mitral valve disease.  Thanks

## 2010-06-25 NOTE — Telephone Encounter (Signed)
Informed pt of results.

## 2010-06-26 ENCOUNTER — Telehealth: Payer: Self-pay | Admitting: *Deleted

## 2010-06-26 NOTE — Telephone Encounter (Signed)
Gwyn with Dr Magdalene Patricia office called and wants to know if pt is surgically clear for surg. She had 2-D Echo on June 24, 2010. Please Advise thank you

## 2010-06-26 NOTE — Telephone Encounter (Signed)
Reviewed note for pre-op clearance. 2 d echo was done and was fine. She is cleared for surgery.

## 2010-06-27 NOTE — Telephone Encounter (Signed)
Informed [name] at Dr Magdalene Patricia office that 2-D echo was reviewed and pt is cleared for surg.

## 2010-07-08 ENCOUNTER — Telehealth: Payer: Self-pay | Admitting: *Deleted

## 2010-07-08 MED ORDER — HYDROCODONE-ACETAMINOPHEN 5-325 MG PO TABS: 1.0000 | ORAL_TABLET | Freq: Four times a day (QID) | ORAL | Status: DC | PRN

## 2010-07-08 NOTE — Telephone Encounter (Addendum)
Patient requesting refill of hydrocodone 5/325 q 6 hrs prn #90 - [facility]. OK?   - Also needs copy of recent labs.

## 2010-07-08 NOTE — Telephone Encounter (Signed)
Patient aware of RF, labs mailed to pt's address

## 2010-07-08 NOTE — Telephone Encounter (Signed)
OK for refill on hydrocodone for 1 month with 2 refill. Last labs from 3/13 - ok to provide a copy if she wants it.

## 2010-07-10 ENCOUNTER — Encounter (HOSPITAL_COMMUNITY)
Admission: RE | Admit: 2010-07-10 | Discharge: 2010-07-10 | Disposition: A | Discharge status: Home / Self Care | Payer: Medicare Other | Source: Ambulatory Visit | Attending: Orthopedic Surgery | Admitting: Orthopedic Surgery

## 2010-07-10 ENCOUNTER — Other Ambulatory Visit (HOSPITAL_COMMUNITY): Payer: Self-pay | Admitting: Orthopedic Surgery

## 2010-07-10 ENCOUNTER — Ambulatory Visit (HOSPITAL_COMMUNITY)
Admission: RE | Admit: 2010-07-10 | Discharge: 2010-07-10 | Disposition: A | Discharge status: Home / Self Care | Payer: Medicare Other | Source: Ambulatory Visit | Attending: Orthopedic Surgery | Admitting: Orthopedic Surgery

## 2010-07-10 DIAGNOSIS — S83289A Other tear of lateral meniscus, current injury, unspecified knee, initial encounter: Secondary | ICD-10-CM

## 2010-07-10 DIAGNOSIS — Z01818 Encounter for other preprocedural examination: Secondary | ICD-10-CM | POA: Insufficient documentation

## 2010-07-10 DIAGNOSIS — I517 Cardiomegaly: Secondary | ICD-10-CM | POA: Insufficient documentation

## 2010-07-10 DIAGNOSIS — Z01812 Encounter for preprocedural laboratory examination: Secondary | ICD-10-CM | POA: Insufficient documentation

## 2010-07-10 DIAGNOSIS — I10 Essential (primary) hypertension: Secondary | ICD-10-CM | POA: Insufficient documentation

## 2010-07-10 DIAGNOSIS — Z01810 Encounter for preprocedural cardiovascular examination: Secondary | ICD-10-CM | POA: Insufficient documentation

## 2010-07-10 LAB — CBC
HCT: 42.2 % (ref 36.0–46.0)
Hemoglobin: 14.4 g/dL (ref 12.0–15.0)
MCHC: 34.1 g/dL (ref 30.0–36.0)
MCV: 91.5 fL (ref 78.0–100.0)
RDW: 13 % (ref 11.5–15.5)
WBC: 8.6 10*3/uL (ref 4.0–10.5)

## 2010-07-10 LAB — BASIC METABOLIC PANEL
BUN: 18 mg/dL (ref 6–23)
CO2: 27 mEq/L (ref 19–32)
GFR calc non Af Amer: >60 mL/min (ref >60)
Glucose, Bld: 104 mg/dL — ABNORMAL HIGH (ref 70–99)
Potassium: 4.6 mEq/L (ref 3.5–5.1)
Sodium: 129 mEq/L — ABNORMAL LOW (ref 135–145)

## 2010-07-15 ENCOUNTER — Ambulatory Visit (HOSPITAL_COMMUNITY)
Admission: RE | Admit: 2010-07-15 | Discharge: 2010-07-15 | Disposition: A | Discharge status: Home / Self Care | Payer: Medicare Other | Source: Ambulatory Visit | Attending: Orthopedic Surgery | Admitting: Orthopedic Surgery

## 2010-07-15 DIAGNOSIS — I10 Essential (primary) hypertension: Secondary | ICD-10-CM | POA: Insufficient documentation

## 2010-07-15 DIAGNOSIS — M659 Unspecified synovitis and tenosynovitis, unspecified site: Secondary | ICD-10-CM | POA: Insufficient documentation

## 2010-07-15 DIAGNOSIS — H9190 Unspecified hearing loss, unspecified ear: Secondary | ICD-10-CM | POA: Insufficient documentation

## 2010-07-15 DIAGNOSIS — M23329 Other meniscus derangements, posterior horn of medial meniscus, unspecified knee: Secondary | ICD-10-CM | POA: Insufficient documentation

## 2010-07-15 DIAGNOSIS — M23302 Other meniscus derangements, unspecified lateral meniscus, unspecified knee: Secondary | ICD-10-CM | POA: Insufficient documentation

## 2010-07-15 DIAGNOSIS — M2240 Chondromalacia patellae, unspecified knee: Secondary | ICD-10-CM | POA: Insufficient documentation

## 2010-07-15 DIAGNOSIS — Z01812 Encounter for preprocedural laboratory examination: Secondary | ICD-10-CM | POA: Insufficient documentation

## 2010-07-15 HISTORY — PX: KNEE ARTHROSCOPY W/ MENISCECTOMY: SHX1879

## 2010-07-15 LAB — BASIC METABOLIC PANEL
BUN: 14 mg/dL (ref 6–23)
CO2: 28 mEq/L (ref 19–32)
Chloride: 100 mEq/L (ref 96–112)
Creatinine, Ser: 0.66 mg/dL (ref 0.4–1.2)
GFR calc Af Amer: >60 mL/min (ref >60)
GFR calc non Af Amer: >60 mL/min (ref >60)
Glucose, Bld: 109 mg/dL — ABNORMAL HIGH (ref 70–99)
Potassium: 4.1 mEq/L (ref 3.5–5.1)
Sodium: 134 mEq/L — ABNORMAL LOW (ref 135–145)

## 2010-07-30 ENCOUNTER — Telehealth: Payer: Self-pay | Admitting: *Deleted

## 2010-07-30 MED ORDER — ONDANSETRON HCL 4 MG PO TABS: 4.0000 mg | ORAL_TABLET | Freq: Every day | ORAL | Status: DC | PRN

## 2010-07-30 NOTE — Telephone Encounter (Signed)
zofran 4mg  po q4 prn listed and submitted to pharmacy

## 2010-07-30 NOTE — Telephone Encounter (Signed)
Patient requesting rx to help with nausea. She is has stopped the vicodin but still has nausea.

## 2010-07-30 NOTE — Telephone Encounter (Signed)
Patient/husband notified.

## 2010-08-02 NOTE — Op Note (Signed)
NAME:  [name], [name]             ACCOUNT NO.:  [id number]  MEDICAL RECORD NO.:  [id number]           PATIENT TYPE:  O  LOCATION:  [facility]                         FACILITY:  [facility]  PHYSICIAN:  Doralee Albino. Carola Frost, M.D. DATE OF BIRTH:  02-02-1916  DATE OF PROCEDURE:  07/15/2010 DATE OF DISCHARGE:  07/15/2010                              OPERATIVE REPORT   PREOPERATIVE DIAGNOSES: 1. Left knee medial and lateral meniscal tears. 2. Left knee synovitis. 3. Left knee chondromalacia, medial and lateral patellofemoral     compartments.  PROCEDURES: 1. Left knee arthroscopic partial medial and lateral meniscectomies. 2. Chondroplasty, medial patellofemoral compartment. 3. Extensive synovectomy.  SURGEON:  Doralee Albino. Carola Frost, MD  ASSISTANT:  None.  ANESTHESIA:  General.  COMPLICATIONS:  None.  TOTAL TOURNIQUET TIME:  32 minutes.  DISPOSITION:  To PACU.  CONDITION:  Stable.  BRIEF SUMMARY/INDICATIONS FOR PROCEDURE:  [name] is a very pleasant 77 year old female who has had progressive mechanical symptoms interfering with her activities of daily living.  She was deemed too elderly for a total knee arthroplasty and wished for less extreme interventions to reduce her mechanical symptoms, reduce her probability of falling from the locking, catching, and giving way that she was experiencing and to improve her daily activity level.  She understood the risks to include recurrence or progression of symptoms, failure to eliminate all her symptoms, need for further surgery, DVT, PE, heart attack, stroke, infection, and multiple others and she did wish to proceed.  She underwent preoperative evaluation by both her primary care physician and cardiologist including an additional echocardiogram which showed ejection fraction between 60-65%.  BRIEF SUMMARY OF PROCEDURE:  [name] was taken to operating room where general anesthesia was induced.  Her left lower extremity was prepped  and draped in usual sterile fashion.  A tourniquet was placed and not initially inflated during the procedure.  I began with arthroscopic evaluation, finding extensive synovitis in the anterior compartment of the knee such that the synovium clearly impinged.  There was very little working room in the patellofemoral joint.  This was established carefully with use of the arthroscopic shaver introduced through a medial working portal and then the suprapatellar pouch examined, removing some loose chondral flaps off the patella and patellar surface.  There were grade 3 changes that were extensive but fairly well preserved and stable over the trochlea and this area was shaved back to a stable surface.  The medial compartment was then examined, finding a degenerative meniscal tear involving almost the entire posterior horn as well as up to the mid body.  This was debrided with the basket and then shaver back to a stable base.  It could not be displaced into the joint.  Both the femoral and tibial surfaces were shaved arthroscopically as well along the chondral surface.  I then decided to debride some of the very excessive and prominent synovium along the medial edge of the patellofemoral joint where it was clearly impinging.  This resulted in some bleeding which was controlled with the use of the tourniquet as well as the arthroscopic wand for coagulation. I then examined the medial  compartment, finding an anterior and mid body tear that was quite substantial and this was debrided back with the shaver and arthroscopic basket as well.  Again, some more chondral shaving was performed of the femur and tibial surfaces where there were some grade 2 and 3 changes, but on the whole her knee was very well- preserved for a 77 year old.  The instruments were then removed from the knee and the knee injected with 40 mg of Kenalog given the extensive synovitis and also a 7 cubic centimeter mixture of 0.25%  Marcaine with epi and morphine.  Sterile gently compressive dressing was applied.  The tourniquet was deflated.  The patient was taken to the PACU in stable condition.  PROGNOSIS:  [name] will be weightbearing as tolerated with unrestricted range of motion.  I expect that this will relieve most of her biomechanical symptoms and improve her activities of daily living. I will plan to see her back at the office in 10 days for removal of sutures and further evaluation.     Doralee Albino. Carola Frost, M.D.     MHH/MEDQ  D:  07/15/2010  T:  07/16/2010  Job:  [id number]  Electronically Signed by Myrene Galas M.D. on 08/02/2010 07:57:39 AM

## 2010-08-08 ENCOUNTER — Ambulatory Visit: Payer: Medicare Other | Attending: Orthopedic Surgery | Admitting: Physical Therapy

## 2010-08-08 DIAGNOSIS — IMO0001 Reserved for inherently not codable concepts without codable children: Secondary | ICD-10-CM | POA: Insufficient documentation

## 2010-08-08 DIAGNOSIS — R262 Difficulty in walking, not elsewhere classified: Secondary | ICD-10-CM | POA: Insufficient documentation

## 2010-08-08 DIAGNOSIS — M25569 Pain in unspecified knee: Secondary | ICD-10-CM | POA: Insufficient documentation

## 2010-08-08 DIAGNOSIS — M25669 Stiffness of unspecified knee, not elsewhere classified: Secondary | ICD-10-CM | POA: Insufficient documentation

## 2010-08-10 ENCOUNTER — Other Ambulatory Visit: Payer: Self-pay | Admitting: Internal Medicine

## 2010-08-13 ENCOUNTER — Ambulatory Visit: Payer: Medicare Other | Admitting: Physical Therapy

## 2010-08-13 NOTE — Assessment & Plan Note (Signed)
[facility]                            CARDIOLOGY OFFICE NOTE   NAME:[name], [name]                    MRN:          [id number]  DATE:09/09/2006                            DOB:          Jan 09, 1916    [name] is a very pleasant 77 year old white female with a  history of significant hypertension, probable mitral valve prolapse and  a history of palpitations.  The patient has been getting along quite  well with no cardiac symptoms.   She had an echo in November, 2005 revealing a normal LV and EF.  She has  mild concentric LVH and trace mitral regurgitation with no prolapse.   The patient has no cardiac symptoms.  She is now under the care of Dr.  Debby Bud.   She is on Toprol XL 100, HCTZ 12.5, K-Dur 20, Lipitor 40, Zetia 10.   PHYSICAL EXAMINATION:  VITAL SIGNS:  Blood pressure 144/81, pulse 71,  normal sinus rhythm.  GENERAL APPEARANCE:  Patient appears much younger than her stated age.  NECK:  JVP is not elevated.  Carotid pulses are bilaterally equal  without bruits.  LUNGS:  Clear.  CARDIAC:  Normal.  ABDOMEN:  Normal.  EXTREMITIES:  Normal.   EKG is normal.   IMPRESSION:  As above, the patient continues to get along quite well.  We plan on checking her lipids and BMP, and she will follow up with Dr.  Debby Bud in November.  I have not made a definite return appointment for  cardiology but will leave this up to Dr. Debby Bud.     Cecil Cranker, MD, [facility]  Electronically Signed    EJL/MedQ  DD: 09/09/2006  DT: 09/09/2006  Job #: [id number]

## 2010-08-15 ENCOUNTER — Ambulatory Visit: Payer: Medicare Other | Admitting: Physical Therapy

## 2010-08-16 NOTE — Assessment & Plan Note (Signed)
[facility]                           PRIMARY CARE OFFICE NOTE   NAME:[name], [name]                    MRN:          [id number]  DATE:07/08/2006                            DOB:          July 06, 1915    [name] is an 77 year old woman who is referred through the  courtesy of Dr. Glennon Hamilton to establish for ongoing continuity primary  care.   CHIEF COMPLAINT:  The patient reports she has a 10 day history of a sore  neck.  She was seen at Urgent Care and diagnosed with sinus infection.  She had a C spine series done, which showed degenerative joint disease.  By her report, she was treated with a muscle relaxant, which did cause  her to have some residual dizziness.  She was not given any antibiotics.   PAST MEDICAL HISTORY:  1. Tonsillectomy at age 75.  2. Hysterectomy for uterine prolapse.  Also for AP bladder suspension.      No other surgeries reported.  3. Medical:  The patient had the usual childhood diseases.  She has a      history of arthritis.  4. GERD.  5. Hay fever with allergies.  6. Arrhythmia.  7. Hypertension.  8. Hyperlipidemia.  9. Plantar fasciitis on the left.  10.Urinary tract infection.   CURRENT MEDICATIONS:  1. Toprol 100 mg daily.  2. Hydrochlorothiazide 12.5 mg daily.  3. Potassium 20 mEq daily.  4. Flax seed oil.  5. Lipitor 40 mg daily.  6. Zetia 10 mg daily.  7. Centrum Silver.  8. Unasyn.   PHYSICIAN ROSTER:  Dr. Glennon Hamilton for cardiology.   FAMILY HISTORY:  Noncontributory in an 77 year old, but it is  significant for arthritis in her mother, hypertension, hyperlipidemia  does run in the family.   SOCIAL HISTORY:  The patient is a high Garment/textile technologist.  She was married  for 47 years and has been widowed for 12 years.  She has 1 daughter, 1  son.  She has twin grandsons.  Her son lives with her.  She has been a  full-time Proofreader.  She does walk on a daily basis.  She does remain  active.   END OF LIFE CARE:  The patient did clearly state that she does not want  to be resuscitated from a cardiac arrest, nor would she want heroic or  extraordinary measures, although she would want all treatment that is  reasonable and useful.   REVIEW OF SYSTEMS:  Negative for any constructional, cardiovascular,  respiratory, GI, or GU problems, and she does consider herself healthy  and feels well.   HABITS:  Tobacco, none.  Alcohol, none.  The patient does use a  seatbelt.  She does exercise at least 3 times a week.   PHYSICAL EXAM:  VITAL SIGNS:  Temperature was 97.2, blood pressure was  146/74, pulse 74, weight 182.  GENERAL APPEARANCE:  This is a well-nourished, well-developed woman  looking younger than her stated chronologic age.  Physical exam was deferred at this point to followup visit.   CHART REVIEW:  1. Stress Cardiolite study  January 10, 2003 read as a negative study      with a small inferobasal defect, which was not reversible, which      was thought to be secondary to attenuation artifact.  2. A 2D echo performed February 06, 2004, which revealed mild      concentric left ventricular hypertrophy with a normal left ejection      fraction.  Mitral inflow suggested impaired left ventricular      diastolic relaxation.  3. The patient had abdominal ultrasound with no evidence of aortic      aneurysm.   Most recent laboratory from April 21, 2006, ordered by Dr. Corinda Gubler,  revealed a glucose of 111, electrolytes were otherwise normal.  Kidney  function and liver functions were normal.  Cholesterol was 145,  triglyceride 67, HDL 54.9, LDL was 77.  The patient had a GFR that was  robust for her age at 72 mL per minute.  Last chest x-ray from January 05, 2003 was unremarkable.   ASSESSMENT AND PLAN:  1. Hyperlipidemia.  The patient is well-controlled on her present      medical regimen.  She will continue the same.  2. Cardiovascular.  The patient had a well-controlled blood  pressure      at this time on her present medication.  She will continue the      same.  The patient with probable mitral valve prolapse,      palpitations controlled by Toprol.  3. Health maintenance.  I have encouraged the patient to get a      mammogram.  She is not a candidate for colorectal cancer screening,      given her advanced age and excellent health.   The patient is asked to return to see me for a consolidation visit and  full physical exam in the next several weeks at her convenience.     Cynthia Gess Norins, MD  Electronically Signed    MEN/MedQ  DD: 07/11/2006  DT: 07/11/2006  Job #: [id number]   cc:   [name]

## 2010-08-16 NOTE — Assessment & Plan Note (Signed)
[facility]                            CARDIOLOGY OFFICE NOTE   NAME:[name], [name]                    MRN:          [id number]  DATE:04/20/2006                            DOB:          [birth year]    [name] is a very pleasant 77 year old white female with a history  of significant hypertension, probable mitral valve prolapse, and a  history of palpitations.  She has been feeling quite well with no recent  cardiac symptoms.  The most recent echo on February 06, 2004 revealed a  normal left ventricle and normal ejection fraction.  There was mild  concentric LVH.  The mitral valve revealed trace mitral regurgitation  with no evidence of prolapse.   Medications included Toprol XL 100, hydrochlorothiazide 12.5, K-Dur 40,  Zetia 10.   PHYSICAL EXAMINATION:  VITAL SIGNS:  Blood pressure 115/90, pulse 74,  normal sinus rhythm.  GENERAL APPEARANCE:  Normal.  NECK:  JVP not elevated.  Carotid pulses are palpable and equal without  bruits.  LUNGS:  Clear.  CARDIAC:  No murmur, click, or gallop.  ABDOMEN:  Normal.  EXTREMITIES:  Normal.   EKG within normal limits.   IMPRESSION:  Diagnoses as above.   The patient is getting along quite well.  Her former primary physician,  Dr. Lance Bosch, has moved to [facility], and she has requested primary care  physician in our practice.  I made her an appointment to see Dr. Debby Bud.  Hopefully, he can see her within the next few months.  Plan to obtain  lipid profile, LFTs, and BNP.  I have not given her a definite return  cardiology appointment but will leave this up to Dr. Debby Bud.   I am certainly pleased that her blood pressure is well controlled at  this time.     Cecil Cranker, MD, [facility]  Electronically Signed    EJL/MedQ  DD: 04/20/2006  DT: 04/20/2006  Job #: [id number]

## 2010-08-16 NOTE — Op Note (Signed)
   NAME:  [name], [name]                       ACCOUNT NO.:  [id number]   MEDICAL RECORD NO.:  [id number]                   PATIENT TYPE:  AMB   LOCATION:  ENDO                                 FACILITY:  [facility]   PHYSICIAN:  Georgiana Spinner, M.D.                 DATE OF BIRTH:  1915/09/18   DATE OF PROCEDURE:  12/08/2002  DATE OF DISCHARGE:                                 OPERATIVE REPORT   PROCEDURE:  Upper endoscopy.   INDICATIONS:  GERD.   ANESTHESIA:  Demerol 70 mg, Versed 7 mg.   DESCRIPTION OF PROCEDURE:  With the patient mildly sedated in the left  lateral decubitus position, the Olympus videoscopic endoscope was inserted  in the mouth, passed into the esophagus under direct vision, and advanced.  The esophagus appeared normal.  Advanced into the stomach through the distal  esophagus, and a hiatal hernia was noted and measured to be approximately 5  cm in height.  The hernia sac was photographed.  We advanced.  Fundus, body,  antrum, duodenal bulb, second portion of the duodenum all appeared normal.  From this point the endoscope was slowly withdrawn, taking circumferential  views of the duodenal mucosa until the endoscope had been pulled back into  the stomach, placed in retroflexion to view the stomach from below, and once  again the hiatal hernia was again visualized.  The endoscope was then  straightened and withdrawn, taking circumferential views of the remaining  gastric and esophageal mucosa.  The patient's vital signs and pulse oximetry  remained stable.  The patient tolerated the procedure well without apparent  complications.   FINDINGS:  A large hiatal hernia approximately 5 cm in size, otherwise  unremarkable examination.   IMPRESSION:  Probable cause of the patient's symptomatology related to the  hiatal hernia.   PLAN:  Will have the patient follow up with me as an outpatient and continue  present therapy.     Georgiana Spinner, M.D.    GMO/MEDQ  D:  12/08/2002  T:  12/09/2002  Job:  [id number]   cc:   Reinaldo Raddle. Lance Bosch, M.D.  [facility]  [address]  [place]  [place] [address]  Fax: [phone]

## 2010-08-20 ENCOUNTER — Ambulatory Visit: Payer: Medicare Other | Admitting: Physical Therapy

## 2010-08-21 ENCOUNTER — Ambulatory Visit: Payer: Medicare Other | Admitting: Physical Therapy

## 2010-08-21 ENCOUNTER — Encounter: Payer: Medicare Other | Admitting: Physical Therapy

## 2010-08-28 ENCOUNTER — Telehealth: Payer: Self-pay | Admitting: *Deleted

## 2010-08-28 NOTE — Telephone Encounter (Signed)
PA requested for: Ondansetron 4mg  Hcl tablet PA Form requested from [employer] @ [phone] [08/23/10] Form received, completed & faxed for Approval [08/27/10] Approval recvd & faxed to pharmacy @ [phone]  [facility] to inform Pt.

## 2010-09-03 ENCOUNTER — Encounter: Payer: Medicare Other | Admitting: Physical Therapy

## 2010-09-09 ENCOUNTER — Other Ambulatory Visit: Payer: Self-pay | Admitting: Dermatology

## 2010-09-10 ENCOUNTER — Encounter: Payer: Medicare Other | Admitting: Physical Therapy

## 2010-09-10 ENCOUNTER — Other Ambulatory Visit: Payer: Self-pay | Admitting: Internal Medicine

## 2010-09-12 ENCOUNTER — Encounter: Payer: Medicare Other | Admitting: Physical Therapy

## 2010-09-13 ENCOUNTER — Telehealth: Payer: Self-pay | Admitting: *Deleted

## 2010-09-13 NOTE — Telephone Encounter (Signed)
Called and spoke with clyde. Recommend Tylenol PM one tablet at bedtime.

## 2010-09-13 NOTE — Telephone Encounter (Signed)
Son (clyde) left msg mother is having problem sleeping at night. Requesting md to call in something to [facility]. Pls advise?

## 2010-11-04 ENCOUNTER — Other Ambulatory Visit: Payer: Self-pay | Admitting: Internal Medicine

## 2010-11-07 ENCOUNTER — Emergency Department (HOSPITAL_COMMUNITY): Payer: Medicare Other

## 2010-11-07 ENCOUNTER — Emergency Department (HOSPITAL_COMMUNITY)
Admission: EM | Admit: 2010-11-07 | Discharge: 2010-11-07 | Disposition: A | Discharge status: Home / Self Care | Payer: Medicare Other | Attending: Emergency Medicine | Admitting: Emergency Medicine

## 2010-11-07 DIAGNOSIS — Y92009 Unspecified place in unspecified non-institutional (private) residence as the place of occurrence of the external cause: Secondary | ICD-10-CM | POA: Insufficient documentation

## 2010-11-07 DIAGNOSIS — M25559 Pain in unspecified hip: Secondary | ICD-10-CM | POA: Insufficient documentation

## 2010-11-07 DIAGNOSIS — S79919A Unspecified injury of unspecified hip, initial encounter: Secondary | ICD-10-CM | POA: Insufficient documentation

## 2010-11-07 DIAGNOSIS — S7000XA Contusion of unspecified hip, initial encounter: Secondary | ICD-10-CM | POA: Insufficient documentation

## 2010-11-07 DIAGNOSIS — I10 Essential (primary) hypertension: Secondary | ICD-10-CM | POA: Insufficient documentation

## 2010-11-07 DIAGNOSIS — IMO0002 Reserved for concepts with insufficient information to code with codable children: Secondary | ICD-10-CM | POA: Insufficient documentation

## 2010-11-07 DIAGNOSIS — E785 Hyperlipidemia, unspecified: Secondary | ICD-10-CM | POA: Insufficient documentation

## 2010-11-15 ENCOUNTER — Emergency Department (HOSPITAL_COMMUNITY): Payer: Medicare Other

## 2010-11-15 ENCOUNTER — Inpatient Hospital Stay (HOSPITAL_COMMUNITY)
Admission: EM | Admit: 2010-11-15 | Discharge: 2010-11-17 | DRG: 641 | Disposition: A | Discharge status: Home Health | Payer: Medicare Other | Attending: Internal Medicine | Admitting: Internal Medicine

## 2010-11-15 DIAGNOSIS — I10 Essential (primary) hypertension: Secondary | ICD-10-CM | POA: Diagnosis present

## 2010-11-15 DIAGNOSIS — Z66 Do not resuscitate: Secondary | ICD-10-CM | POA: Diagnosis present

## 2010-11-15 DIAGNOSIS — H9190 Unspecified hearing loss, unspecified ear: Secondary | ICD-10-CM | POA: Diagnosis present

## 2010-11-15 DIAGNOSIS — E785 Hyperlipidemia, unspecified: Secondary | ICD-10-CM | POA: Diagnosis present

## 2010-11-15 DIAGNOSIS — E669 Obesity, unspecified: Secondary | ICD-10-CM | POA: Diagnosis present

## 2010-11-15 DIAGNOSIS — N390 Urinary tract infection, site not specified: Secondary | ICD-10-CM | POA: Diagnosis present

## 2010-11-15 DIAGNOSIS — E871 Hypo-osmolality and hyponatremia: Principal | ICD-10-CM | POA: Diagnosis present

## 2010-11-15 LAB — URINE MICROSCOPIC-ADD ON

## 2010-11-15 LAB — CBC
Platelets: 302 10*3/uL (ref 150–400)
RBC: 4.21 MIL/uL (ref 3.87–5.11)
WBC: 7 10*3/uL (ref 4.0–10.5)

## 2010-11-15 LAB — COMPREHENSIVE METABOLIC PANEL
ALT: 14 U/L (ref 0–35)
Albumin: 3.5 g/dL (ref 3.5–5.2)
Alkaline Phosphatase: 58 U/L (ref 39–117)
Chloride: 88 mEq/L — ABNORMAL LOW (ref 96–112)
Glucose, Bld: 123 mg/dL — ABNORMAL HIGH (ref 70–99)
Potassium: 4.1 mEq/L (ref 3.5–5.1)
Sodium: 121 mEq/L — ABNORMAL LOW (ref 135–145)
Total Bilirubin: 0.3 mg/dL (ref 0.3–1.2)
Total Protein: 6.7 g/dL (ref 6.0–8.3)

## 2010-11-15 LAB — BASIC METABOLIC PANEL
GFR calc Af Amer: >60 mL/min (ref >60)
GFR calc non Af Amer: >60 mL/min (ref >60)
Potassium: 3.6 mEq/L (ref 3.5–5.1)
Sodium: 127 mEq/L — ABNORMAL LOW (ref 135–145)

## 2010-11-15 LAB — DIFFERENTIAL
Basophils Relative: 0 % (ref 0–1)
Eosinophils Absolute: 0.1 10*3/uL (ref 0.0–0.7)
Neutrophils Relative %: 71 % (ref 43–77)

## 2010-11-15 LAB — URINALYSIS, ROUTINE W REFLEX MICROSCOPIC
Bilirubin Urine: NEGATIVE
Glucose, UA: NEGATIVE mg/dL
Ketones, ur: NEGATIVE mg/dL
pH: 7 (ref 5.0–8.0)

## 2010-11-15 LAB — PROTIME-INR: Prothrombin Time: 14 seconds (ref 11.6–15.2)

## 2010-11-15 LAB — APTT: aPTT: 34 seconds (ref 24–37)

## 2010-11-15 LAB — OSMOLALITY: Osmolality: 261 mOsm/kg — ABNORMAL LOW (ref 275–300)

## 2010-11-16 DIAGNOSIS — E871 Hypo-osmolality and hyponatremia: Secondary | ICD-10-CM

## 2010-11-16 DIAGNOSIS — I10 Essential (primary) hypertension: Secondary | ICD-10-CM

## 2010-11-16 DIAGNOSIS — F29 Unspecified psychosis not due to a substance or known physiological condition: Secondary | ICD-10-CM

## 2010-11-16 LAB — BASIC METABOLIC PANEL
BUN: 14 mg/dL (ref 6–23)
GFR calc Af Amer: >60 mL/min (ref >60)
GFR calc non Af Amer: >60 mL/min (ref >60)
Potassium: 3.7 mEq/L (ref 3.5–5.1)
Sodium: 130 mEq/L — ABNORMAL LOW (ref 135–145)

## 2010-11-16 LAB — URINE CULTURE: Culture  Setup Time: 201208171327

## 2010-11-17 LAB — BASIC METABOLIC PANEL
CO2: 27 mEq/L (ref 19–32)
Calcium: 9.1 mg/dL (ref 8.4–10.5)
Glucose, Bld: 102 mg/dL — ABNORMAL HIGH (ref 70–99)
Sodium: 128 mEq/L — ABNORMAL LOW (ref 135–145)

## 2010-11-19 ENCOUNTER — Telehealth: Payer: Self-pay | Admitting: *Deleted

## 2010-11-19 NOTE — Telephone Encounter (Signed)
Hospice RN called. Pt was just d/c'd from hospital and family is concerned about low sodium. RN wants to know if MD wants any labs drawn?

## 2010-11-19 NOTE — Telephone Encounter (Signed)
dishcarged from hospital on Sunday - sodium was 128. She was taken off HCTZ and told to liberalize salt intake. HH RN was to monitor BP and mental status. OK to have a Bmet late next week.

## 2010-11-25 NOTE — Telephone Encounter (Signed)
Spoke with [facility] health RN. Pt has appointment tomorrow with MD will Draw a BMET at office.

## 2010-11-26 ENCOUNTER — Ambulatory Visit (INDEPENDENT_AMBULATORY_CARE_PROVIDER_SITE_OTHER): Payer: Medicare Other | Admitting: Internal Medicine

## 2010-11-26 ENCOUNTER — Other Ambulatory Visit (INDEPENDENT_AMBULATORY_CARE_PROVIDER_SITE_OTHER): Payer: Medicare Other

## 2010-11-26 VITALS — BP 124/76 | HR 86 | Temp 98.0°F | Wt 165.0 lb

## 2010-11-26 DIAGNOSIS — I10 Essential (primary) hypertension: Secondary | ICD-10-CM

## 2010-11-26 DIAGNOSIS — E871 Hypo-osmolality and hyponatremia: Secondary | ICD-10-CM

## 2010-11-26 LAB — COMPREHENSIVE METABOLIC PANEL
Albumin: 4.2 g/dL (ref 3.5–5.2)
Alkaline Phosphatase: 51 U/L (ref 39–117)
BUN: 23 mg/dL (ref 6–23)
Calcium: 9.1 mg/dL (ref 8.4–10.5)
GFR: 87.35 mL/min (ref >60.00)
Glucose, Bld: 123 mg/dL — ABNORMAL HIGH (ref 70–99)
Potassium: 4.6 mEq/L (ref 3.5–5.1)

## 2010-11-26 MED ORDER — FUROSEMIDE 20 MG PO TABS: 20.0000 mg | ORAL_TABLET | Freq: Every day | ORAL | Status: DC

## 2010-11-26 NOTE — Discharge Summary (Signed)
NAMEKENNETHIA, [name]             ACCOUNT NO.:  [id number]  MEDICAL RECORD NO.:  [id number]  LOCATION:  4709                         FACILITY:  [facility]  PHYSICIAN:  Rosalyn Gess. Norins, MD  DATE OF BIRTH:  1915/07/24  DATE OF ADMISSION:  11/15/2010 DATE OF DISCHARGE:  11/17/2010                              DISCHARGE SUMMARY   ADMITTING DIAGNOSIS:  Hyponatremia.  DISCHARGE DIAGNOSIS:  Altered mental status resolved, hyponatremia stable, hypertension stable.  CONSULTANTS:  None.  PROCEDURES: 1. Two-view chest x-ray day of admission which showed cardiomegaly,     possible vascular congestion. 2. CT of the head without contrast which showed chronic microvascular     ischemia with no acute abnormality. 3. Diagnostic x-ray of knee 4 views which showed no acute osseous     abnormality. 4. Diagnostic films of right hip which showed a normal examination.  HISTORY OF PRESENT ILLNESS:  Ms. [name] is a 77 year old Caucasian woman very hard of hearing who lives independently with her son who was brought to the hospital because of mental status changes.  The patient had confusion and difficulty talking.  The patient does have a history of hypertension and had been taking hydrochlorothiazide and metoprolol. In addition, she had been seen recently by [facility] and started on ciprofloxacin for UTI.  Incidentally, the patient had a fall 2 weeks prior where she has complained of right hip pain and difficulty with walking.  She had been using cold packs.  The patient had no prior history of syncope.  No history of seizure.  She denied any chest pain or shortness of breath. She had no nausea, vomiting, or diarrhea.  The patient's ability to give history is limited by her deafness.  Admission evaluation did reveal the patient to have a sodium of 121.  It was presumed she had mental status changes secondary to hyponatremia and she was admitted for treatment. Please see the HPI and EMR  records for past medical history, family history, and social history.  PHYSICAL EXAMINATION AT ADMISSION:  VITAL SIGNS:  Significant for blood pressure 154/81, temperature 98, pulse 62. HEENT:  Unremarkable. NECK:  Supple. CHEST:  Clear. CARDIOVASCULAR:  Unrevealing. ABDOMEN:  Soft without tenderness. EXTREMITIES:  Without clubbing, cyanosis, or edema.  She had tenderness of the right hip.  X-RAY STUDIES:  As noted.  ADMISSION LABORATORY DATA:  White count of 8000, hemoglobin 12.9 grams, sodium 121, potassium 4.1, chloride 88, CO2 of 22, BUN 11, creatinine 0.47, glucose 123.  UA was negative.  HOSPITAL COURSE: 1. Hyponatremia.  The patient was put to fluid restriction.  She was     not given significant IV fluids.  On this regimen, the patient's     sodium improved to 130 on the 18th.  Her mental status was back to     baseline.  She had been taken off hydrochlorothiazide and Norvasc     had been substituted which she tolerated well with good blood     pressure control.  The patient had a followup BMET on the day of     discharge with a sodium of 128.  Although this remains slightly     depressed, her mental status is  normal.  She can be discharged home     with instructions to liberalize salt intake. 2. Hypertension well controlled on her new regimen and she will     continue the same avoiding diuretics. 3. UTI.  The patient had a negative urinalysis.  She will not need     additional antibiotics. 4. Orthopedic.  The patient had hip and knee films that were     unremarkable.  The patient was seen by physical therapy and she did     have some difficulties of limitations with walking and therefore     home health physical therapy was recommended.  The patient does not     live alone or unattended.  With the patient's mental status having     cleared with her sodium being improved, at this time she is ready     for discharge to home.  DISCHARGE EXAMINATION:  VITAL SIGNS:   Temperature 97.2, blood pressure 135/77, heart rate 64, respirations 18. GENERAL APPEARANCE:  This is an obese white female elderly, hard of hearing in no acute distress, very emotional and easily tearful. HEENT:  Normocephalic, atraumatic.  Conjunctivae and sclerae were clear. CHEST:  The patient is moving air well with no wheezes, no rales, no increased work of breathing. CARDIOVASCULAR:  2+ radial pulse.  Precordium was quiet.  Heart sounds were distant but regular. ABDOMEN:  Obese, soft with positive bowel sounds.  No guarding, rebound, or tenderness. GENITALIA AND RECTAL:  Deferred. EXTREMITIES:  The patient's knees were examined.  There was no erythema, no effusion.  No tenderness to palpation. NEURO:  The patient is awake, alert.  She is oriented to person, place, time, and context.  She is extremely hard of hearing.  Cranial nerves II- XII were intact.  The patient is moving all of her extremities in bed without assistance.  FINAL LABORATORY DATA:  Sodium of 128, potassium 3.6, chloride 94, CO2 of 27, BUN of 18, creatinine 0.5, glucose 102.  Urine culture was no growth.  Serum osmolality was slightly low at 261.  DISPOSITION:  The patient is to be discharged home.  She will have home health physical therapy for evaluation and treatment.  We will have home health nursing evaluate her twice weekly for 3 weeks to ensure compliance with medications, maintain normal mental status, and monitoring her vital signs.  The patient's condition at time of discharge dictation is stable, improved but with a guarded prognosis given her advanced age.     Rosalyn Gess Norins, MD     MEN/MEDQ  D:  11/17/2010  T:  11/17/2010  Job:  [id number]  Electronically Signed by Illene Regulus MD on 11/26/2010 04:58:27 PM

## 2010-11-26 NOTE — Patient Instructions (Signed)
Hyponatremia - will check lab today. Please continue to liberalize your sodium intake and restrict your fluid intake to 1.5 quarts a day.  Blood pressure:  BP Readings from Last 3 Encounters:  11/26/10 124/76  06/11/10 158/88  02/27/10 132/80  Good control - continue the Norvasc (amlodipine) daily  Leg cramps - continue the gabapentin at bedtime  In somnia - the gabapentin plus melatonin should do the job.  Swelling of the feet (edema) - will start furosemide to help with the swelling.   Leg Cramps Leg cramps that occur during exercise can be caused by poor circulation. However, muscle cramps that occur at rest or during the night are usually not due to any serious medical problem. Heat cramps may cause muscle spasms during hot weather.   CAUSE There is no clear cause for muscle cramps. However, dehydration may be a factor for those who exercise in the heat. Imbalances in the level of sodium, potassium, calcium or magnesium in the muscle tissue may also be a factor. TREATMENT  Make sure your diet has enough fluids and essential minerals for the muscle to work normally.   Avoid strenuous exercise for several days if you have been having frequent leg cramps.   Stretch and massage the cramped muscle for several minutes.   Some medicines may be helpful in some patients with night cramps. Only take over-the-counter or prescription medicines as directed by your caregiver.  SEEK IMMEDIATE MEDICAL CARE IF:  Your leg cramps become worse.   Your foot becomes cold, numb or blue.  Document Released: 04/24/2004 Document Re-Released: 06/13/2008 [facility] Patient Information 2011 [place], [place].Insomnia Insomnia is frequent trouble falling and/or staying asleep. Insomnia can be a long term problem or a short term problem. Both are common. Insomnia can be a short term problem when the wakefulness is related to a certain stress or worry. Long term insomnia is often related to ongoing stress during  waking hours and/or poor sleeping habits. Overtime, sleep deprivation itself can make the problem worse. Every little thing feels more severe because you are overtired and your ability to cope is decreased. SYMPTOMS  Not feeling rested in the morning.   Anxiety and restlessness at bedtime.   Difficulty falling and staying asleep.  CAUSES  Stress, anxiety, and depression.   Poor sleeping habits.   Distractions such as TV in the bedroom.   Naps close to bedtime.   Engaging in emotionally charged conversations before bed.   Technical reading before sleep.   Alcohol and other sedatives. They may make the problem worse. They can hurt normal sleep patterns and normal dream activity.   Stimulants such as caffeine for several hours prior to bedtime.   Pain syndromes and shortness of breath can cause insomnia.   Exercise late at night.   Changing time zones may cause sleeping problems (jet lag).  It is sometimes helpful to have someone observe your sleeping patterns. They should look for periods of not breathing during the night (sleep apnea). They should also look to see how long those periods last. If you live alone or observers are uncertain, you can also be observed at a sleep clinic where your sleep patterns will be professionally monitored. Sleep apnea requires a checkup and treatment. Give your caregivers your medical history. Give your caregivers observations your family has made about your sleep.   TREATMENT  Your caregiver may prescribe treatment for an underlying medical disorders. Your caregiver can give advice or help if you are using alcohol or  other drugs for self-medication. Treatment of underlying problems will usually eliminate insomnia problems.   Medications can be prescribed for short time use. They are generally not recommended for lengthy use.   Over-the-counter sleep medicines are not recommended for lengthy use. They can be habit forming.   You can promote  easier sleeping by making lifestyle changes such as:   Using relaxation techniques that help with breathing and reduce muscle tension.   Exercising earlier in the day.   Changing your diet and the time of your last meal. No night time snacks.   Establish a regular time to go to bed.   Counseling can help with stressful problems and worry.   Soothing music and white noise may be helpful if there are background noises you cannot remove.   Stop tedious detailed work at least one hour before bedtime.  HOME CARE INSTRUCTIONS  Keep a diary. Inform your caregiver about your progress. This includes any medication side effects. See your caregiver regularly. Take note of:   Times when you are asleep.   Times when you are awake during the night.      The quality of your sleep.   How you feel the next day.      This information will help your caregiver care for you.  Get out of bed if you are still awake after 15 minutes. Read or do some quiet activity. Keep the lights down. Wait until you feel sleepy and go back to bed.   Keep regular sleeping and waking hours. Avoid naps.   Exercise regularly.   Avoid distractions at bedtime. Distractions include watching television or engaging in any intense or detailed activity like attempting to balance the household checkbook.   Develop a bedtime ritual. Keep a familiar routine of bathing, brushing your teeth, climbing into bed at the same time each night, listening to soothing music. Routines increase the success of falling to sleep faster.   Use relaxation techniques. This can be using breathing and muscle tension release routines. It can also include visualizing peaceful scenes. You can also help control troubling or intruding thoughts by keeping your mind occupied with boring or repetitive thoughts like the old concept of counting sheep. You can make it more creative like imagining planting one beautiful flower after another in your backyard  garden.   During your day, work to eliminate stress. When this is not possible use some of the previous suggestions to help reduce the anxiety that accompanies stressful situations.  MAKE SURE YOU:    Understand these instructions.   Will watch your condition.   Will get help right away if you are not doing well or get worse.  Document Released: 03/14/2000 Document Re-Released: 02/28/2008 [facility] Patient Information 2011 [place], [place].

## 2010-11-27 ENCOUNTER — Telehealth: Payer: Self-pay | Admitting: Internal Medicine

## 2010-11-27 NOTE — Telephone Encounter (Signed)
Call patient (or her son) sodium is good at 133. Potassium and kidney function normal   Thanbks

## 2010-11-28 NOTE — Assessment & Plan Note (Signed)
BP Readings from Last 3 Encounters:  11/26/10 124/76  06/11/10 158/88  02/27/10 132/80   Blood pressure is well controlled on present medication: ACE-I stopped and norvas and furosemide started.  Plan - continue present medications.

## 2010-11-28 NOTE — Progress Notes (Signed)
  Subjective:    Patient ID: [name], female    DOB: February 24, 1916, 77 y.o.   MRN: [id number]  HPI [name] was recently hospitalized for hyponatremia. She was treated with fluid restriction and lasix. At discharge Na = 128. In the interval since being home she has been ok. She has questions about her medication and her BP. She has otherwise been at her baseline  I have reviewed the patient's medical history in detail and updated the computerized patient record.    Review of Systems System review is negative for any constitutional, cardiac, pulmonary, GI or neuro symptoms or complaints     Objective:   Physical Exam Vitals reviewed - stable Gen'; - eldelry white woman in no distress. Very HOH Lungs - CTAP Cor - RRR       Assessment & Plan:  1. Hyponatremia - no underlying cause found during hospital stay - all records reviewed.  Plan - f/u Bmet           Continued liberalized salt intake  Addendum Sodium - 133.

## 2010-11-28 NOTE — Telephone Encounter (Signed)
Left detailed VM for son

## 2010-12-09 ENCOUNTER — Telehealth: Payer: Self-pay | Admitting: *Deleted

## 2010-12-09 NOTE — Telephone Encounter (Signed)
Attempted to call patient, phone sounded like fax machine, will attempt later.

## 2010-12-09 NOTE — Telephone Encounter (Signed)
Female family member left VM - Pt c/o dizziness with BP med, she is req alt RX. Please advise.

## 2010-12-09 NOTE — Telephone Encounter (Signed)
Not sure it is medication related. Needs ov in several days, but she needs to keep a record of blood pressures

## 2010-12-10 NOTE — Telephone Encounter (Signed)
k

## 2010-12-10 NOTE — Telephone Encounter (Signed)
Spoke w/home health physical therapist. Pt stopped her BP meds 2 days ago b/c she thought it was causing dizziness. She has had symptoms x 2 weeks. BP today 160/100, Therapist advised pt to take her meds. After walk and meds BP was 148/88. Scheduled for OV Friday and advised pt to continue taking meds.

## 2010-12-11 ENCOUNTER — Encounter: Payer: Self-pay | Admitting: Internal Medicine

## 2010-12-13 ENCOUNTER — Ambulatory Visit (INDEPENDENT_AMBULATORY_CARE_PROVIDER_SITE_OTHER): Payer: Medicare Other | Admitting: Internal Medicine

## 2010-12-13 DIAGNOSIS — I10 Essential (primary) hypertension: Secondary | ICD-10-CM

## 2010-12-13 DIAGNOSIS — E871 Hypo-osmolality and hyponatremia: Secondary | ICD-10-CM

## 2010-12-13 DIAGNOSIS — J301 Allergic rhinitis due to pollen: Secondary | ICD-10-CM

## 2010-12-13 DIAGNOSIS — M722 Plantar fascial fibromatosis: Secondary | ICD-10-CM

## 2010-12-13 NOTE — Patient Instructions (Signed)
Blood pressure control - too dizzy from medication, by your report. The last medication added was amlodipine (norvasc). Plan - stop Amlodipine. Start Benicar 20 mg once a day for blood pressure. Continue the furosemide and metoprolol. If you tolerate this OK without dizziness we can then write a prescription or try a generic product that is similar.  For leg pain - you can take the hydrocodone. If this makes you feel bad or nauseated or too tired we can try a different pain medication. It is also ok to take Aleve 1 tablet twice a day to help with the pain  For runny nose - allergy, it is ok to continue with the claritin (generic is fine) or we can try another product, e.g. Fexofenadine 60 mg twice a day (also over the counter).

## 2010-12-16 NOTE — Progress Notes (Signed)
  Subjective:    Patient ID: [name], female    DOB: 02-17-1916, 77 y.o.   MRN: [id number]  HPI Presents w/ complaint of dizziness which she attributes to  BP medication. Communication is a challenge due to her deafness and poor attention. She has not had other symptoms.   She has been following a fluid restriction since her last hospitalization for hyponatremia. Last lab was oK>  I have reviewed the patient's medical history in detail and updated the computerized patient record.    Review of Systems System review is negative for any constitutional, cardiac, pulmonary, GI or neuro symptoms or complaints     Objective:   Physical Exam Vitals noted - good control BP Gen'l - Elderly white woman very HOH HEENT - C&S clear Cor - RRR Pul - normal respirations. Neuro - [facility]       Assessment & Plan:  Hyponatremia - last sodium 133 August 28th  Plan - continue fluid restriction.

## 2010-12-16 NOTE — Assessment & Plan Note (Signed)
BP well controlled but she c/o dizziness that she associates with medication, amlodipine being the last medication started.  Plan - d/c/ amlodipine           Start Benicar 20 mg daily           Continue furosemide and metoprolol           Return for BP check

## 2010-12-16 NOTE — Assessment & Plan Note (Signed)
Patient with continued foot pain.  Plan- ok to use hydrocodone/APAP. May supplement with Aleve - GI precautions given.

## 2010-12-16 NOTE — Assessment & Plan Note (Signed)
C/o rhinits   Plan - otc non-sedating antihistamine, e.g. fexofenadine 60 mg bid or tid

## 2010-12-18 ENCOUNTER — Telehealth: Payer: Self-pay | Admitting: *Deleted

## 2010-12-18 DIAGNOSIS — R262 Difficulty in walking, not elsewhere classified: Secondary | ICD-10-CM

## 2010-12-18 DIAGNOSIS — E871 Hypo-osmolality and hyponatremia: Secondary | ICD-10-CM

## 2010-12-18 DIAGNOSIS — R32 Unspecified urinary incontinence: Secondary | ICD-10-CM

## 2010-12-18 DIAGNOSIS — I10 Essential (primary) hypertension: Secondary | ICD-10-CM

## 2010-12-18 NOTE — Telephone Encounter (Signed)
Not sure. OV this wk w/any MD or next wk w?Dr Debby Bud Thx

## 2010-12-18 NOTE — Telephone Encounter (Signed)
Spoke w/son, See below copied and pasted from last OV notes. Pt continues to c/o dizziness, same as at last OV. Please advise.   Patient Instructions     Blood pressure control - too dizzy from medication, by your report. The last medication added was amlodipine (norvasc). Plan - stop Amlodipine. Start Benicar 20 mg once a day for blood pressure. Continue the furosemide and metoprolol. If you tolerate this OK without dizziness we can then write a prescription or try a generic product that is similar.

## 2010-12-19 NOTE — Telephone Encounter (Signed)
Scheduled for OV next Tuesday.

## 2010-12-24 ENCOUNTER — Ambulatory Visit (INDEPENDENT_AMBULATORY_CARE_PROVIDER_SITE_OTHER): Payer: Medicare Other | Admitting: Internal Medicine

## 2010-12-24 DIAGNOSIS — I10 Essential (primary) hypertension: Secondary | ICD-10-CM

## 2010-12-24 MED ORDER — LOSARTAN POTASSIUM 50 MG PO TABS: 50.0000 mg | ORAL_TABLET | Freq: Every day | ORAL | Status: DC

## 2010-12-24 MED ORDER — MECLIZINE HCL 25 MG PO TABS: 25.0000 mg | ORAL_TABLET | Freq: Three times a day (TID) | ORAL | Status: DC | PRN

## 2010-12-24 NOTE — Patient Instructions (Signed)
Dizziness sounds like inner ear -see below. Plan meclizine 1`2.5 (1/2 25 mg ) every 6 hours as needed. If this doesn't help your dizziness will refer to neurology.  Blood pressure - continue your present medications but will change benicar to generic losartan 50 mg  Vertigo (Labyrinthitis, Dizziness) You have vertigo. This is the feeling (sensation) that you are moving when you are not. If these attacks occur when you are at work, driving, or performing other difficult activities, you may be injured or injure someone else. It may seem as though the world is spinning around or that you are falling to the ground. Because your balance is upset, vertigo can cause you to feel sick to your stomach (nausea) and vomiting. Vertigo may be caused by an infection, related to drug toxicity, or simply a rapid change of position such as lying down or rolling over in bed. There are many different causes of Vertigo.   HOME CARE INSTRUCTIONS  Follow your caregiver's instructions.   Avoid:   Driving.   Operating heavy machinery.   Performing other tasks that would be dangerous to you or others during an attack.   Tell your caregiver if you notice that certain medications seem to be causing vertigo. Some of the medications used to treat attacks can actually aggravate them. Be aware of this and report to your caregiver should this occur.   If the problem you are having is positional, your caregiver may be able to give you instructions for movements and procedures that can help this.  SEEK IMMEDIATE MEDICAL CARE IF:  Medications do not relieve the attack or are making it worse.   You develop any of these changes:   You develop severe headaches.   No relief is obtained for nausea or vomiting or it becomes worse.   You develop visual changes.   A family member notices behavioral changes.   Your condition gets worse.  Document Released: 12/25/2004 Document Re-Released: 09/28/2006 [facility] Patient  Information 2011 [place], [place].

## 2010-12-25 NOTE — Assessment & Plan Note (Signed)
Patient doing well on ARB w/o diuretic.  Plan - Rx for losartan 50 mg qd.

## 2010-12-25 NOTE — Progress Notes (Signed)
  Subjective:    Patient ID: [name], female    DOB: 08/17/1915, 77 y.o.   MRN: [id number]  HPI [name] presents for reevaluation of dizziness. Her daughter is with her today. She has tried the benicar and tolerated it well but it has not helped her symptoms. She does admit to more of a balance problem. There is a mild positional component. She denies any other neurologic symptoms.  I have reviewed the patient's medical history in detail and updated the computerized patient record.    Review of Systems System review is negative for any constitutional, cardiac, pulmonary, GI or neuro symptoms or complaints other than as described in the HPI.     Objective:   Physical Exam Vitals noted - good BP control Gen'l - very elderly white woman who is very HOH HEENT - C&S clear Pulm - normal respirations. Cor - RRR Neuro - able to stand, needs minimal assist to ambulate. Cannot tandem gait.        Assessment & Plan:  Labyrinthitis - the patient's persistent symptoms and her hx c/w prolonged labyrinthitis. Evidence does not suggest a central etiology.   Plan - meclizine 12.5 mg q 6 prn.

## 2010-12-31 ENCOUNTER — Telehealth: Payer: Self-pay | Admitting: *Deleted

## 2010-12-31 NOTE — Telephone Encounter (Signed)
Daughter informed. Med list updated, she will call back with update

## 2010-12-31 NOTE — Telephone Encounter (Signed)
Daughter left VM - pt continues to c/o vertigo. Med given has not helped. She has been waking up at night with h/a's and dizziness has increased. She is req to change her BP med, convinced this is the cause of the problem. Please advise.

## 2010-12-31 NOTE — Telephone Encounter (Signed)
Stop BP medication-losartan. See if dizziness abates.

## 2011-01-02 ENCOUNTER — Telehealth: Payer: Self-pay | Admitting: *Deleted

## 2011-01-02 NOTE — Telephone Encounter (Signed)
Called pt in reg. To letter pt sent to MD. Dr Debby Bud states pt can come off BP medication and states if her dizziness continues he will repeat brain scan.

## 2011-01-03 NOTE — Telephone Encounter (Signed)
Son informed.

## 2011-01-06 ENCOUNTER — Telehealth: Payer: Self-pay | Admitting: *Deleted

## 2011-01-06 DIAGNOSIS — R42 Dizziness and giddiness: Secondary | ICD-10-CM

## 2011-01-06 NOTE — Telephone Encounter (Signed)
Son called. Pt stopped her BP meds as suggested due to dizziness. She is c/o increase in dizziness. Please advise.

## 2011-01-06 NOTE — Telephone Encounter (Signed)
Son informed.

## 2011-01-06 NOTE — Telephone Encounter (Signed)
1. Clearly not BP medication - she should resume medications - resuming the last stopped first 2. Needs MRI/MRI brain to r/o CNS causes of dizziness. Order to [facility]

## 2011-01-06 NOTE — H&P (Signed)
[name], [name]             ACCOUNT NO.:  [id number]  MEDICAL RECORD NO.:  [id number]  LOCATION:  [facility]                         FACILITY:  [facility]  PHYSICIAN:  Mauro Kaufmann, MD         DATE OF BIRTH:  [birth year]  DATE OF ADMISSION:  11/15/2010 DATE OF DISCHARGE:                             HISTORY & PHYSICAL   PRIMARY CARE PHYSICIAN:  Dr. Debby Bud.  CHIEF COMPLAINT:  Altered mental status.  PRESENT ILLNESS:  This is a 77 year old female who was brought to the hospital by her son because the patient developed mental status changes this morning.  The patient seems to be normal at that time she was confused and had trouble talking.  The patient has a history of hypertension and has been taking hydrochlorothiazide and metoprolol for that.  Incidentally, the patient also fell 2 weeks ago, at that time, the patient's son says that she had injured her right hip, since then she has been having pain in the right hip.  This morning the patient put ice on the hip and she forgot, and when the son asked her about the ice, the patient totally forgot about it and seemed to be confused.  There was no history of any seizure.  No history of any passing out episode. No chest pain or shortness of breath.  No nausea, vomiting or diarrhea. The patient seems to have returned to her baseline.  Due to the deafness the patient is a poor historian, also the patient's son also has deafness, but he was able to be provide history.  PAST MEDICAL HISTORY:  Significant for 1. Hypertension. 2. Hyperlipidemia. 3. History of palpitations.  SURGICAL HISTORY:  The patient had 1. Hysterectomy. 2. Knee surgery. 3. Tonsillectomy.  SOCIAL HISTORY:  The patient is nonsmoker, no history of alcohol abuse. No history of illicit drug abuse.  ALLERGIES:  No known drug allergies.  CURRENT MEDICATIONS:  The patient takes at home includes 1. Multivitamin 1 p.o. daily. 2. Metoprolol XL 100 mg p.o. daily. 3. Klor-Con  20 mEq p.o. daily. 4. Hydrochlorothiazide 25 mg p.o. daily.  REVIEW OF SYSTEMS:  As in the HPI.  Rest of the systems unobtainable at this time.  PHYSICAL EXAMINATION:  VITAL SIGNS:  The patient's blood pressure is 154/81, temperature 98.0, pulse 62, respirations 15. HEENT:  Head is normocephalic.  Eyes:  Extraocular muscles are intact. Oral mucosa is pink and moist. NECK:  Supple. CHEST:  Clear to auscultation bilaterally. HEART: S1 and S2 regular in rate and rhythm. ABDOMEN:  Soft, nontender.  No organomegaly. EXTREMITIES:  No cyanosis or clubbing or edema.  There is positive tenderness noted in the right hip but no erythema noted.  IMAGING STUDIES IN THE HOSPITAL:  Shows chest x-ray on August 17th showed cardiomegaly and possible mild pulmonary vascular congestion.  CT head with contrast showed chronic microvascular ischemia.  No acute abnormality.  PERTINENT LABS:  WBC 8.0, hemoglobin 12.9, hematocrit 36.8, platelet count of 302.  Sodium 121, potassium is 4.1, chloride is 88, CO2 22, BUN 11, creatinine 0.47.  Glucose 123.  Urinalysis is negative.  ASSESSMENT: 1. Altered mental status. 2. Hyponatremia. 3. Hypertension. 4. Recently treated urinary tract  infection.  PLAN: 1. Altered mental status.  The patient's altered mental status seems     to be resolved, most likely it is secondary to the hyponatremia,     but the patient also had a UTI treated and she was started on Cipro     by the [facility] and the patient's son says that she has     been taking for last 5 days.  At this time, UA is negative.  We     will repeat the urine culture and sensitivity to make sure there is     no infection. 2. Hyponatremia.  The patient has been on hydrochlorothiazide which     appears to be appropriate for causing the hyponatremia at this     time.  I am going to hold the hydrochlorothiazide.  We will obtain     the urine and serum osmolality.  We will put the patient on fluid      restriction for 1200 mL per day and we will repeat the BMET q.8 h.     As the patient's mental status has come to baseline, I do not think     the patient require any 3% normal saline solution at this time. 3. Hypertension.  Again, the patient has been on metoprolol and     hydrochlorothiazide.  I am going to discontinue hydrochlorothiazide     and start the patient on amlodipine 10 mg p.o. daily. 4. Questionable UTI, as above, we will check the urine analysis and     culture sensitivity.  At this time, I am not going to start the     patient on antibiotics at this time.  Her white count is normal.     She is afebrile and UA is negative. 5. DVT prophylaxis.  The patient will be given Lovenox for DVT     prophylaxis. 6. Code status:  I discussed with the patient's son, patient is a DNR.     Mauro Kaufmann, MD     GL/MEDQ  D:  11/15/2010  T:  11/15/2010  Job:  [id number]  Electronically Signed by Mauro Kaufmann  on 01/06/2011 03:58:08 PM

## 2011-01-11 ENCOUNTER — Other Ambulatory Visit: Payer: Self-pay | Admitting: Internal Medicine

## 2011-01-11 ENCOUNTER — Ambulatory Visit
Admission: RE | Admit: 2011-01-11 | Discharge: 2011-01-11 | Disposition: A | Discharge status: Home / Self Care | Payer: Medicare Other | Source: Ambulatory Visit | Attending: Internal Medicine | Admitting: Internal Medicine

## 2011-01-11 DIAGNOSIS — R42 Dizziness and giddiness: Secondary | ICD-10-CM

## 2011-01-12 ENCOUNTER — Encounter: Payer: Self-pay | Admitting: Internal Medicine

## 2011-01-17 ENCOUNTER — Telehealth: Payer: Self-pay

## 2011-01-17 ENCOUNTER — Encounter: Payer: Self-pay | Admitting: Neurology

## 2011-01-17 DIAGNOSIS — R42 Dizziness and giddiness: Secondary | ICD-10-CM

## 2011-01-17 NOTE — Telephone Encounter (Signed)
Patient son called c/o continued dizziness and nausea. Son was advised per MD to continued zofran and referral for Neurology will be sent.

## 2011-01-31 ENCOUNTER — Ambulatory Visit (INDEPENDENT_AMBULATORY_CARE_PROVIDER_SITE_OTHER): Payer: Medicare Other | Admitting: Neurology

## 2011-01-31 ENCOUNTER — Encounter: Payer: Self-pay | Admitting: Neurology

## 2011-01-31 VITALS — BP 144/90 | HR 84 | Wt 167.0 lb

## 2011-01-31 DIAGNOSIS — R42 Dizziness and giddiness: Secondary | ICD-10-CM

## 2011-01-31 NOTE — Progress Notes (Signed)
Dear Dr. Debby Bud,  Thank you for having me see [name] in consultation today at [facility] for her problem with dizziness.  As you may recall, she is a 77 y.o. year old female with a history of hypertension, hyperlipidemia who presents with a history of what feels to her like lightheadedness since after an admission for symptomatic hyponatremia resulting in confusion.  At that time her blood pressure medications were changed and she has complained of lightheadedness since.  This lightheadedness does not bother her in the am., but instead only bothers her after she takes her bp medications.  It typically remits in the later afternoon or evening.  It does not bother her when she lies down or when she is sitting.  It is not only when she stands up suddenly.  Interestingly, she says that she develops dizziness when she eats, particularly spicy foods.  There is no spinning.  Meclizine was attempted but was not successful.  An MRI/MRA of her brain revealed WMD and a stenotic right M1.  Apparently she had her BP medications dropped for several days, but this did not help the dizziness.  Past Medical History  Diagnosis Date  . History of hiatal hernia   . Mitral valve prolapse   . Arthritis   . Hyperlipemia   . Plantar fasciitis   . Hay fever   . GERD (gastroesophageal reflux disease)   . Palpitations   . Hypertension     Past Surgical History  Procedure Date  . Baldder suspension   . Foot surgery   . Tonsillectomy   . Abdominal hysterectomy     History   Social History  . Marital Status: Widowed    Spouse Name: N/A    Number of Children: N/A  . Years of Education: N/A   Social History Main Topics  . Smoking status: Never Smoker   . Smokeless tobacco: Never Used  . Alcohol Use: None  . Drug Use: None  . Sexually Active: None   Other Topics Concern  . None   Social History Narrative   Married 47 years - widowed [phone] son, 1 daughter 2 grandsonsLives in her own  home, son lives with her. Tries to walk every day. Starting to get out more after ortho injury. I- ADLS.End-of-life issues. DNR and no heroic or extraordinary measures.    Family History not significant for neurologic disease   ROS:  13 systems were reviewed and are notable for bilateral knee pain as well as chronic nasal congestion.  All other review of systems are unremarkable.   Examination:  Filed Vitals:   01/31/11 1314  BP: 144/90  Pulse: 84  Weight: 167 lb (75.751 kg)    No significant bp change going from lying to standing.  In general, well appearing older woman in NAD, who is very hard of hearing.  Cardiovascular: The patient has a generally RRR although does have a few premature beats and no carotid bruits.  Fundoscopy:  Compromised by constricted pupils.  Mental status:   The patient is oriented to person, place and time. Recent and remote memory are intact. Attention span and concentration are normal. Language including repetition, naming, following commands are intact. Fund of knowledge of current and historical events, as well as vocabulary are normal.  Cranial Nerves: Pupils are equally round and reactive to light. Visual fields full to confrontation. Extraocular movements are intact without nystagmus. Facial sensation and muscles of mastication are intact. Muscles of facial expression are symmetric. Bilateral  finger run not detectable bilaterally. Tongue protrusion, uvula, palate midline.  Shoulder shrug intact  Motor:  The patient has normal bulk and tone, no pronator drift.  Mild postural tremor.  5/5 strength  Reflexes:   Biceps  Triceps Brachioradialis Knee Ankle  Right 2+  2+  2+   2+ 0  Left  2+  2+  2+   2+ 0  Toes down  Coordination:  Normal finger to nose.    Sensation is decreased to temperature and vibration in feet.  Gait and Station is antalgic and mildly unsteady but not clearly ataxic.   MRI brain was reviewed and revealed moderated  WMD.  Clear stenosis of the right M1.  I reviewed Dr. Debby Bud notes and clearly the dizziness seemed to start after her hospitalization in August concomitant with a change in blood pressure medications and the introduction of Lasix and the switch from HCTZ, multiple medication changes did not seem to help the dizziness, but I did not see an attempt to stop the Lasix.  Impression/Recs: The combination of the diurnal variation, with the onset after the morning medications are taken, the improvement in the afternoon, symptoms of postprandial hypotension worse with lunch than with dinner make me almost certain this is medication related possibly contributed to by poor autonomic tone secondary to aging.    If possible I would attempt a trial of stopping all BP meds and the Lasix for at least two weeks to see if it makes a difference. It appears that the Lasix is the only med that has not been altered since she left hospital so this may be the culprit even though it is less likely to lower blood pressure.  I do not think her MRA findings are at all related to her symptoms.   She can follow up with me on a PRN basis.  However, I do not think her dizziness is due to a primary neurologic problem.   Thank you for having [place] see [name] in consultation.  Feel free to contact me with any questions.  Cynthia Raider Modesto Charon, MD [facility], [place] [address] [place], [place] [address] Phone: [phone] Fax: [phone].

## 2011-02-11 ENCOUNTER — Other Ambulatory Visit: Payer: Self-pay | Admitting: Gastroenterology

## 2011-03-04 ENCOUNTER — Ambulatory Visit (INDEPENDENT_AMBULATORY_CARE_PROVIDER_SITE_OTHER): Payer: Medicare Other | Admitting: Internal Medicine

## 2011-03-04 ENCOUNTER — Encounter: Payer: Self-pay | Admitting: Internal Medicine

## 2011-03-04 VITALS — BP 132/80 | HR 91 | Temp 98.4°F | Resp 14 | Wt 165.2 lb

## 2011-03-04 DIAGNOSIS — I10 Essential (primary) hypertension: Secondary | ICD-10-CM

## 2011-03-04 DIAGNOSIS — Z23 Encounter for immunization: Secondary | ICD-10-CM

## 2011-03-04 DIAGNOSIS — K219 Gastro-esophageal reflux disease without esophagitis: Secondary | ICD-10-CM

## 2011-03-04 DIAGNOSIS — R42 Dizziness and giddiness: Secondary | ICD-10-CM

## 2011-03-04 NOTE — Progress Notes (Signed)
  Subjective:    Patient ID: [name], female    DOB: [birth year], 77 y.o.   MRN: [id number]  HPI Mrs. Fullam has been evaluated for dizziness. She has seen Dr. Modesto Charon who could not make a neurologic diagnosis. Dr. Modesto Charon suggested a drug holiday to see if it is the blood pressure medication but she did not heed this advice. She has seen Dr. Dulce Sellar, [facility], who declines EGD but she has been scheduled for Upper GI x-ray series. She continues to have dizziness which she thinks is related to BP medication - but she is taking the medication. BP today is 132/80. Yogurt seems to have helped some. hShe has no new c/o.   I have reviewed the patient's medical history in detail and updated the computerized patient record.    Review of Systems System review is negative for any constitutional, cardiac, pulmonary, GI or neuro symptoms or complaints other than as described in the HPI.     Objective:   Physical Exam Vitals noted - BP well controlled Gen'l - well preserved white woman looking younger than her age of 76. Very HOH HEENT - C&S clear Cor - RRR Pulm - normal respirations. Neuro - HOH, non-focal       Assessment & Plan:

## 2011-03-04 NOTE — Patient Instructions (Signed)
Blood pressure - good control at today's visit. Continue present medication but you may take a drug holiday if you want to see if the medication is causing dizziness.  Stomach issues - per Dr. Dulce Sellar. Have a copy of the x-ray report sent to me. OK to take the prilosec.   Allergy - ok to try the nasonex nasal spray: 2 (two) sprays to each nostril once a day. If this helps we can call in a prescription for you

## 2011-03-05 ENCOUNTER — Other Ambulatory Visit: Payer: Self-pay | Admitting: Internal Medicine

## 2011-03-05 DIAGNOSIS — R42 Dizziness and giddiness: Secondary | ICD-10-CM | POA: Insufficient documentation

## 2011-03-05 NOTE — Assessment & Plan Note (Signed)
Chronic dysequilibrium/dizziness. Thorough evaluation has been unrevealing. No response to meclizine. Her symptoms are not debilitating  Plan - no further work-up at this time.

## 2011-03-05 NOTE — Assessment & Plan Note (Signed)
BP Readings from Last 3 Encounters:  03/04/11 132/80  01/31/11 144/90  12/24/10 128/80   Encouraged her to continue her medications. A drug holiday to further evaluate dizziness will be ok if she chooses to do this.

## 2011-03-05 NOTE — Assessment & Plan Note (Signed)
Question of relationship between GI and dizziness. She also has some discomfort associated with eating. She is seeing Dr. Dulce Sellar and is scheduled for UGI series this week as part of her evaluation.  Plan - per Dr. Dulce Sellar

## 2011-03-07 ENCOUNTER — Ambulatory Visit
Admission: RE | Admit: 2011-03-07 | Discharge: 2011-03-07 | Disposition: A | Discharge status: Home / Self Care | Payer: Medicare Other | Source: Ambulatory Visit | Attending: Gastroenterology | Admitting: Gastroenterology

## 2011-03-07 ENCOUNTER — Other Ambulatory Visit: Payer: Self-pay | Admitting: Gastroenterology

## 2011-03-20 ENCOUNTER — Other Ambulatory Visit: Payer: Self-pay | Admitting: *Deleted

## 2011-03-20 MED ORDER — MOMETASONE FUROATE 50 MCG/ACT NA SUSP: 2.0000 | Freq: Every day | NASAL | Status: DC

## 2011-03-28 ENCOUNTER — Institutional Professional Consult (permissible substitution): Payer: Medicare Other | Admitting: Internal Medicine

## 2011-04-07 ENCOUNTER — Telehealth: Payer: Self-pay | Admitting: *Deleted

## 2011-04-07 MED ORDER — POTASSIUM CHLORIDE CRYS ER 20 MEQ PO TBCR: 40.0000 meq | EXTENDED_RELEASE_TABLET | Freq: Every day | ORAL | Status: DC

## 2011-04-07 NOTE — Telephone Encounter (Signed)
rx for klor con sent to pharmacy...04/07/11@11 :29am/LMB

## 2011-04-15 ENCOUNTER — Emergency Department (HOSPITAL_COMMUNITY)
Admission: EM | Admit: 2011-04-15 | Discharge: 2011-04-16 | Disposition: A | Discharge status: Home / Self Care | Payer: Medicare Other | Attending: Emergency Medicine | Admitting: Emergency Medicine

## 2011-04-15 ENCOUNTER — Emergency Department (HOSPITAL_COMMUNITY): Payer: Medicare Other

## 2011-04-15 ENCOUNTER — Encounter (HOSPITAL_COMMUNITY): Payer: Self-pay | Admitting: *Deleted

## 2011-04-15 ENCOUNTER — Other Ambulatory Visit: Payer: Self-pay

## 2011-04-15 DIAGNOSIS — F29 Unspecified psychosis not due to a substance or known physiological condition: Secondary | ICD-10-CM | POA: Insufficient documentation

## 2011-04-15 DIAGNOSIS — E785 Hyperlipidemia, unspecified: Secondary | ICD-10-CM | POA: Insufficient documentation

## 2011-04-15 DIAGNOSIS — N390 Urinary tract infection, site not specified: Secondary | ICD-10-CM | POA: Insufficient documentation

## 2011-04-15 DIAGNOSIS — I10 Essential (primary) hypertension: Secondary | ICD-10-CM | POA: Insufficient documentation

## 2011-04-15 DIAGNOSIS — K219 Gastro-esophageal reflux disease without esophagitis: Secondary | ICD-10-CM | POA: Insufficient documentation

## 2011-04-15 DIAGNOSIS — M129 Arthropathy, unspecified: Secondary | ICD-10-CM | POA: Insufficient documentation

## 2011-04-15 DIAGNOSIS — R42 Dizziness and giddiness: Secondary | ICD-10-CM | POA: Insufficient documentation

## 2011-04-15 DIAGNOSIS — Z79899 Other long term (current) drug therapy: Secondary | ICD-10-CM | POA: Insufficient documentation

## 2011-04-15 LAB — CBC
MCH: 31.2 pg (ref 26.0–34.0)
MCHC: 33.9 g/dL (ref 30.0–36.0)
MCV: 92.1 fL (ref 78.0–100.0)
Platelets: 226 10*3/uL (ref 150–400)
RBC: 4.45 MIL/uL (ref 3.87–5.11)
RDW: 13.3 % (ref 11.5–15.5)

## 2011-04-15 LAB — POCT I-STAT, CHEM 8
BUN: 18 mg/dL (ref 6–23)
Calcium, Ion: 1.2 mmol/L (ref 1.12–1.32)
Hemoglobin: 14.3 g/dL (ref 12.0–15.0)
Sodium: 138 mEq/L (ref 135–145)
TCO2: 28 mmol/L (ref 0–100)

## 2011-04-15 LAB — POCT I-STAT TROPONIN I: Troponin i, poc: 0 ng/mL (ref 0.00–0.08)

## 2011-04-15 NOTE — ED Notes (Signed)
Her bp has been running high for several dasy and she woke up from a nap this afternoon and she was confused her bp was checked and it was high

## 2011-04-15 NOTE — ED Notes (Signed)
Patient arrives due to elevated BP and stated that she took a nap today at 3pm and woke up at 6pm and was confused.  Thought it was morning time.  Upon arrival to the room, amb from triage with her cane, she was alert and oriented

## 2011-04-15 NOTE — ED Provider Notes (Addendum)
History     CSN: [id number]  Arrival date & time 04/15/11  2142   First MD Initiated Contact with Patient 04/15/11 2323      Chief Complaint  Patient presents with  . Hypertension   family states pressure has been elevated over the past several days. Apparently woke up this afternoon and family states " was confused."   Blood pressure noted in triage to be 185/87. Patient seems to be back at her baseline at this time. Does not remember being confused. Acting normally. She denies any headache, slurred speech or blurred vision. Denies any focal, motor weakness. She states she is "been dizzy since last summer." Patient denies chest pain, denies dysuria. She denies any fever, chest pain or cough. Denies any distal edema. She states her blood pressure medications were recently adjusted by her primary physician. Patient does have chronic back pain, which is unchanged.  She denies any symptoms suggestive of ataxia. Denies any vertigo. She's had no visual or speech changes.  (Consider location/radiation/quality/duration/timing/severity/associated sxs/prior treatment) HPI  Past Medical History  Diagnosis Date  . History of hiatal hernia   . Mitral valve prolapse   . Arthritis   . Hyperlipemia   . Plantar fasciitis   . Hay fever   . GERD (gastroesophageal reflux disease)   . Palpitations   . Hypertension     Past Surgical History  Procedure Date  . Baldder suspension   . Foot surgery   . Tonsillectomy   . Abdominal hysterectomy     History reviewed. No pertinent family history.  History  Substance Use Topics  . Smoking status: Never Smoker   . Smokeless tobacco: Never Used  . Alcohol Use: No    OB History    Grav Para Term Preterm Abortions TAB SAB Ect Mult Living                  Review of Systems  All other systems reviewed and are negative.    Allergies  Aleve  Home Medications   Current Outpatient Rx  Name Route Sig Dispense Refill  . PRELIEF PO Oral Take 1  tablet by mouth daily as needed. For acidic foods    . CRANBERRY 140-100-3 MG-MG-UNIT PO CAPS Oral Take 1 capsule by mouth 4 (four) times daily.    . FUROSEMIDE 20 MG PO TABS Oral Take 1 tablet (20 mg total) by mouth daily. 30 tablet 11  . MELATONIN 3 MG PO TABS Oral Take 1 tablet by mouth at bedtime as needed. For sleep    . METOPROLOL SUCCINATE ER 100 MG PO TB24 Oral Take 100 mg by mouth daily.    . MOMETASONE FUROATE 50 MCG/ACT NA SUSP Nasal Place 2 sprays into the nose daily. 17 g 12  . OMEPRAZOLE 20 MG PO CPDR Oral Take 20 mg by mouth daily.      Marland Kitchen POTASSIUM CHLORIDE CRYS ER 20 MEQ PO TBCR Oral Take 2 tablets (40 mEq total) by mouth daily. 60 tablet 6    BP 169/71  Pulse 68  Temp(Src) 97.9 F (36.6 C) (Oral)  Resp 18  SpO2 96%  Physical Exam  Nursing note and vitals reviewed. Constitutional: She is oriented to person, place, and time. She appears well-developed and well-nourished.  HENT:  Head: Normocephalic and atraumatic.  Eyes: Conjunctivae and EOM are normal. Pupils are equal, round, and reactive to light.  Neck: Neck supple.       No carotid bruits appreciated  Cardiovascular: Normal rate and  regular rhythm.  Exam reveals no gallop and no friction rub.   No murmur heard. Pulmonary/Chest: Breath sounds normal. She has no wheezes. She has no rales. She exhibits no tenderness.  Abdominal: Soft. Bowel sounds are normal. She exhibits no distension. There is no tenderness. There is no rebound and no guarding.  Musculoskeletal: Normal range of motion.  Neurological: She is alert and oriented to person, place, and time. She displays normal reflexes. No cranial nerve deficit. She exhibits normal muscle tone. Coordination normal.       Awake alert, oriented, no pronator drift. Grip strength equal, symmetric. Gait was examined, and is normal.  Skin: Skin is warm and dry. No rash noted.  Psychiatric: She has a normal mood and affect.    ED Course  Procedures (including critical  care time)  Labs Reviewed  URINALYSIS, ROUTINE W REFLEX MICROSCOPIC - Abnormal; Notable for the following:    APPearance CLOUDY (*)    Leukocytes, UA TRACE (*)    All other components within normal limits  POCT I-STAT, CHEM 8 - Abnormal; Notable for the following:    Glucose, Bld 115 (*)    All other components within normal limits  CBC  POCT I-STAT TROPONIN I  URINE MICROSCOPIC-ADD ON  I-STAT TROPONIN I  I-STAT, CHEM 8   Ct Head Wo Contrast  04/16/2011  *RADIOLOGY REPORT*  Clinical Data: Confusion  CT HEAD WITHOUT CONTRAST  Technique:  Contiguous axial images were obtained from the base of the skull through the vertex without contrast.  Comparison: 11/15/2010  Findings: Stable mild atrophy and microvascular ischemic changes in the periventricular white matter.  Remote left basal ganglia lacunar type infarct, image 11.  No acute intracranial hemorrhage, definite acute infarction, mass lesion, midline shift, herniation, hydrocephalus, or extra-axial fluid collection.  Gray-white matter differentiation maintained.  Cisterns patent.  Cerebellar atrophy as well.  Atherosclerosis of the intracranial vessels.  Mastoids and sinuses clear.  IMPRESSION: Stable atrophy and microvascular changes.  No acute finding.  Original Report Authenticated By: Judie Petit. Ruel Favors, M.D.     No diagnosis found.    MDM  Pt is seen and examined;  Initial history and physical completed.  Will follow.         Date: 04/15/2011  Rate: 76  Rhythm: normal sinus rhythm  QRS Axis: normal  Intervals: normal  ST/T Wave abnormalities: nonspecific ST/T changes  Conduction Disutrbances:none  Narrative Interpretation:   Old EKG Reviewed: changes noted   Results for orders placed during the hospital encounter of 04/15/11  URINALYSIS, ROUTINE W REFLEX MICROSCOPIC      Component Value Range   Color, Urine YELLOW  YELLOW    APPearance CLOUDY (*) CLEAR    Specific Gravity, Urine 1.008  1.005 - 1.030    pH 6.0  5.0  - 8.0    Glucose, UA NEGATIVE  NEGATIVE (mg/dL)   Hgb urine dipstick NEGATIVE  NEGATIVE    Bilirubin Urine NEGATIVE  NEGATIVE    Ketones, ur NEGATIVE  NEGATIVE (mg/dL)   Protein, ur NEGATIVE  NEGATIVE (mg/dL)   Urobilinogen, UA 0.2  0.0 - 1.0 (mg/dL)   Nitrite NEGATIVE  NEGATIVE    Leukocytes, UA TRACE (*) NEGATIVE   CBC      Component Value Range   WBC 6.8  4.0 - 10.5 (K/uL)   RBC 4.45  3.87 - 5.11 (MIL/uL)   Hemoglobin 13.9  12.0 - 15.0 (g/dL)   HCT 47.8  29.5 - 62.1 (%)   MCV 92.1  78.0 - 100.0 (fL)   MCH 31.2  26.0 - 34.0 (pg)   MCHC 33.9  30.0 - 36.0 (g/dL)   RDW 16.1  09.6 - 04.5 (%)   Platelets 226  150 - 400 (K/uL)  POCT I-STAT, CHEM 8      Component Value Range   Sodium 138  135 - 145 (mEq/L)   Potassium 4.3  3.5 - 5.1 (mEq/L)   Chloride 103  96 - 112 (mEq/L)   BUN 18  6 - 23 (mg/dL)   Creatinine, Ser 4.09  0.50 - 1.10 (mg/dL)   Glucose, Bld 811 (*) 70 - 99 (mg/dL)   Calcium, Ion 9.14  1.12 - 1.32 (mmol/L)   TCO2 28  0 - 100 (mmol/L)   Hemoglobin 14.3  12.0 - 15.0 (g/dL)   HCT 78.2  95.6 - 21.3 (%)  POCT I-STAT TROPONIN I      Component Value Range   Troponin i, poc 0.00  0.00 - 0.08 (ng/mL)   Comment 3           URINE MICROSCOPIC-ADD ON      Component Value Range   Squamous Epithelial / LPF RARE  RARE    WBC, UA 0-2  <3 (WBC/hpf)   Bacteria, UA RARE  RARE    No results found.  1:38 AM  Patient is feeling much better. Last blood pressure 141/59. This is without any intervention. Urine is cloudy with trace leukocytes, likely mild UTI. Will treat for this. Just waiting for chest x-ray and anticipate probable discharge. We'll continue to follow   3:24 AM Patient has been ambulatory in the ED. Her blood pressures have been stable and slightly elevated. Patient will be stable for discharge at this time. Was counseled regarding close following with the primary care physician. She was told to continue her normal blood pressure medications. She'll be treated for a  UTI and was told to get any rest and fluids. We will contact her family to come back for a period. There is no criteria for admission. At this time. Patient remains jovial, laughing, no acute distress  Walking around ED with no difficulty. She is requesting something to drink. Awaiting rides to pick her up. Patient states she will see her primary Dr. this week.  Niara Bunker A. Patrica Duel, MD 04/16/11 0865  Lorelle Gibbs. Patrica Duel, MD 04/16/11 7846  Lorelle Gibbs. Patrica Duel, MD 04/16/11 [phone]

## 2011-04-16 ENCOUNTER — Emergency Department (HOSPITAL_COMMUNITY): Payer: Medicare Other

## 2011-04-16 LAB — URINALYSIS, ROUTINE W REFLEX MICROSCOPIC
Bilirubin Urine: NEGATIVE
Hgb urine dipstick: NEGATIVE
Ketones, ur: NEGATIVE mg/dL
Nitrite: NEGATIVE
Protein, ur: NEGATIVE mg/dL
Urobilinogen, UA: 0.2 mg/dL (ref 0.0–1.0)

## 2011-04-16 LAB — URINE MICROSCOPIC-ADD ON

## 2011-04-16 MED ORDER — SULFAMETHOXAZOLE-TRIMETHOPRIM 400-80 MG PO TABS: 1.0000 | ORAL_TABLET | Freq: Two times a day (BID) | ORAL | Status: AC

## 2011-04-16 NOTE — ED Notes (Signed)
Call placed to son no answer

## 2011-04-17 ENCOUNTER — Telehealth: Payer: Self-pay

## 2011-04-17 NOTE — Telephone Encounter (Signed)
[facility] Triage Call Report Triage Record Num: [id number] Operator: Lesli Albee Patient Name: [name] Call Date & Time: 04/15/2011 8:36:03PM Patient Phone: [phone] PCP: Patient Gender: Female PCP Fax : Patient DOB: [birth year] Practice Name: [facility] Reason for Call: Caller: Clyde/Other; PCP: Illene Regulus; CB#: [phone]; Call Reason: BP Elevated- 180/100; Sx Onset: 04/15/2011; Sx Notes: ; Afebrile; Wt: ; Guideline Used: ; Disp:; Appt Scheduled?: Pt was confused at 6:30 this evening. She woke up that way. BP is 180/100. Pt denies any h/a; nosebleeds; pt denies any chest pain. Rn advised ED. Protocol(s) Used: Hypertension, Diagnosed or Suspected Recommended Outcome per Protocol: See Provider within 4 hours Reason for Outcome: Systolic blood pressure of more than 180 mmHg OR diastolic blood pressure of more than 120 mmHg Care Advice: ~ HEALTH PROMOTION / MAINTENANCE ~ CAUTIONS Medication Advice: - Discontinue all nonprescription and alternative medications, especially stimulants, until evaluated by provider. - Take prescribed medications as directed, following label instructions for the medication. - Do not change medications or dosing regimen until provider is consulted. - Know possible side effects of medication and what to do if they occur. - Tell provider all prescription, nonprescription or alternative medications that you take ~ 04/15/2011 8:44:20PM Page 1 of 1 [email or web address]

## 2011-05-27 ENCOUNTER — Ambulatory Visit (INDEPENDENT_AMBULATORY_CARE_PROVIDER_SITE_OTHER): Payer: Medicare Other | Admitting: Internal Medicine

## 2011-05-27 ENCOUNTER — Encounter: Payer: Self-pay | Admitting: Internal Medicine

## 2011-05-27 VITALS — BP 122/82 | HR 78 | Temp 99.0°F | Resp 14 | Wt 171.5 lb

## 2011-05-27 DIAGNOSIS — M545 Low back pain, unspecified: Secondary | ICD-10-CM

## 2011-05-27 DIAGNOSIS — N390 Urinary tract infection, site not specified: Secondary | ICD-10-CM

## 2011-05-27 DIAGNOSIS — K648 Other hemorrhoids: Secondary | ICD-10-CM

## 2011-05-27 LAB — POCT URINALYSIS DIPSTICK
Blood, UA: NEGATIVE
Glucose, UA: NEGATIVE
Nitrite, UA: NEGATIVE

## 2011-05-27 MED ORDER — HYDROCORTISONE ACETATE 25 MG RE SUPP: 25.0000 mg | Freq: Two times a day (BID) | RECTAL | Status: AC

## 2011-05-27 NOTE — Patient Instructions (Signed)
See handwritten instructions: Not much evidence of an infection - if you continue to have fever or if you have burning with urination call and we can call in a Rx for you.  Blood pressure looks good today.  Hemorrhoids - see instructions.  Back - normal exam. Take tylenol as instructed and stretch every day.

## 2011-05-28 NOTE — Progress Notes (Signed)
  Subjective:    Patient ID: [name], female    DOB: 1916-03-05, 78 y.o.   MRN: [id number]  HPI [name] presents for follow -up. She was seen in the [facility] ED Jan 15 for multiple complaints. Note, labs reviewed. She was treated for a UTI although the final report on the U/a was negative. She still has a low grade fever and is concerned for persistent infection. She complains of chronic back pain that is not disabling but does require that she be careful about lifting and doing work like vacuuming. She has some questions about her bowels - they are regular but will have an initial hard consistency. She is concerned about internal hemorrhoids and occasional blood spotting.   PMH, FamHx and SocHx reviewed for any changes and relevance.    Review of Systems System review is negative for any constitutional, cardiac, pulmonary, GI or neuro symptoms or complaints other than as described in the HPI.     Objective:   Physical Exam Filed Vitals:   05/27/11 1528  BP: 122/82  Pulse: 78  Temp: 99 F (37.2 C)  Resp: 14   Gen'l - WNWD elderly white woman in no distress Pulm - normal respirations Cor - 2+ radial pulse Back exam: normal stand; normal flex to greater than 80 degrees; normal gait; normal toe/heel walk; normal step up to exam table; normal SLR sitting; normal DTRs at the patellar tendons; no  CVA tenderness; able to move supine to sitting without assistance.  Urine dipstick shows positive for leukocytes otherwise normal.           Assessment & Plan:  1. UTI - no evidence of persistent infection thus no need for additional antibiotics  2. Back pain - exam is non-focal with no radicular findings Plan - APAP [phone] mg tid           Daily back stretches/exercise - pamphlet provided  3. Hemorrhoids, internal Plan - moist heat application to anus           Anusol HC suppository bid

## 2011-08-11 ENCOUNTER — Encounter: Payer: Self-pay | Admitting: Internal Medicine

## 2011-08-11 ENCOUNTER — Other Ambulatory Visit: Payer: Self-pay | Admitting: Internal Medicine

## 2011-08-11 ENCOUNTER — Ambulatory Visit (INDEPENDENT_AMBULATORY_CARE_PROVIDER_SITE_OTHER): Payer: Medicare Other | Admitting: Internal Medicine

## 2011-08-11 VITALS — BP 126/70 | HR 94 | Temp 97.5°F | Wt 173.5 lb

## 2011-08-11 DIAGNOSIS — G2581 Restless legs syndrome: Secondary | ICD-10-CM

## 2011-08-11 DIAGNOSIS — G4700 Insomnia, unspecified: Secondary | ICD-10-CM

## 2011-08-11 DIAGNOSIS — H9190 Unspecified hearing loss, unspecified ear: Secondary | ICD-10-CM | POA: Insufficient documentation

## 2011-08-11 MED ORDER — TEMAZEPAM 15 MG PO CAPS: 15.0000 mg | ORAL_CAPSULE | Freq: Every evening | ORAL | Status: DC | PRN

## 2011-08-11 NOTE — Progress Notes (Signed)
  Subjective:    Patient ID: [name], female    DOB: 27-Jan-1916, 78 y.o.   MRN: [id number]  HPI  complains of insomnia Complicated by RLS No relief with OTC meds Requests temazepam as son takes same and pt has tried with good results/good sleep  Past Medical History  Diagnosis Date  . History of hiatal hernia   . Mitral valve prolapse   . Arthritis   . Hyperlipemia   . Plantar fasciitis   . Hay fever   . GERD (gastroesophageal reflux disease)   . Palpitations   . Hypertension       Review of Systems  Respiratory: Negative for cough and shortness of breath.   Musculoskeletal: Negative for joint swelling.  Neurological: Negative for light-headedness and headaches.       Objective:   Physical Exam BP 126/70  Pulse 94  Temp(Src) 97.5 F (36.4 C) (Oral)  Wt 173 lb 8 oz (78.699 kg)  SpO2 96% Gen: NAD - son at side - Pt very HOH Lungs: CTA B CV: RRR Neuro: AAOx4, CN2-12 symmetrically intact, gait normal with cane assist  Lab Results  Component Value Date   WBC 6.8 04/15/2011   HGB 14.3 04/15/2011   HCT 42.0 04/15/2011   PLT 226 04/15/2011   GLUCOSE 115* 04/15/2011   CHOL 151 02/19/2009   TRIG 41.0 02/19/2009   HDL 72.00 02/19/2009   LDLCALC 71 02/19/2009   ALT 18 11/26/2010   AST 21 11/26/2010   NA 138 04/15/2011   K 4.3 04/15/2011   CL 103 04/15/2011   CREATININE 0.80 04/15/2011   BUN 18 04/15/2011   CO2 24 11/26/2010   INR 1.06 11/15/2010       Assessment & Plan:  Chronic insomnia RLS  No relief with OTC meds or Ambein Ok for restoril per PCP - rx done this AM Reviewed same with pt

## 2011-08-11 NOTE — Patient Instructions (Signed)
It was good to see you today. Start the temazepam for sleep - Your prescription(s) have been submitted to your pharmacy. Please take as directed and contact our office if you believe you are having problem(s) with the medication(s).

## 2011-08-15 ENCOUNTER — Telehealth: Payer: Self-pay

## 2011-08-15 NOTE — Telephone Encounter (Signed)
Pt's son called requesting a referral to [facility] hearing clinic for audiology testing.

## 2011-08-18 NOTE — Telephone Encounter (Signed)
Ok Thx 

## 2011-09-03 ENCOUNTER — Other Ambulatory Visit: Payer: Self-pay | Admitting: Internal Medicine

## 2011-09-03 NOTE — Telephone Encounter (Signed)
Mrs Mausolf saw Dr Felicity Coyer on May 13 and was given a RX for Temazepam for sleep.  The son says it was written to give every other day.  He states she will be out by Tues.

## 2011-09-05 ENCOUNTER — Other Ambulatory Visit: Payer: Self-pay | Admitting: *Deleted

## 2011-09-05 MED ORDER — TEMAZEPAM 15 MG PO CAPS: 15.0000 mg | ORAL_CAPSULE | Freq: Every evening | ORAL | Status: DC | PRN

## 2011-09-05 NOTE — Telephone Encounter (Signed)
Received letter from patient to Dr. Debby Bud to have medication refill. Rx phoned to [facility] pharmacy. Patient son notified

## 2011-09-19 ENCOUNTER — Ambulatory Visit: Payer: Medicare Other | Admitting: Internal Medicine

## 2011-09-22 ENCOUNTER — Ambulatory Visit (INDEPENDENT_AMBULATORY_CARE_PROVIDER_SITE_OTHER): Payer: Medicare Other | Admitting: Family Medicine

## 2011-09-22 VITALS — BP 148/74 | HR 76 | Temp 98.1°F | Resp 16 | Ht 67.5 in | Wt 170.0 lb

## 2011-09-22 DIAGNOSIS — N390 Urinary tract infection, site not specified: Secondary | ICD-10-CM

## 2011-09-22 DIAGNOSIS — H9190 Unspecified hearing loss, unspecified ear: Secondary | ICD-10-CM

## 2011-09-22 DIAGNOSIS — R300 Dysuria: Secondary | ICD-10-CM

## 2011-09-22 LAB — POCT URINALYSIS DIPSTICK
Glucose, UA: NEGATIVE
Spec Grav, UA: >=1.03

## 2011-09-22 LAB — POCT UA - MICROSCOPIC ONLY: Mucus, UA: NEGATIVE

## 2011-09-22 MED ORDER — CIPROFLOXACIN HCL 250 MG PO TABS: 250.0000 mg | ORAL_TABLET | Freq: Two times a day (BID) | ORAL | Status: AC

## 2011-09-22 MED ORDER — CIPROFLOXACIN HCL 500 MG PO TABS: 500.0000 mg | ORAL_TABLET | Freq: Two times a day (BID) | ORAL | Status: DC

## 2011-09-22 NOTE — Patient Instructions (Signed)
Your should receive a call or letter about your lab results within the next week to 10 days.  Return to the clinic or go to the nearest emergency room if any of your symptoms worsen or new symptoms occur.  Urinary Tract Infection Infections of the urinary tract can start in several places. A bladder infection (cystitis), a kidney infection (pyelonephritis), and a prostate infection (prostatitis) are different types of urinary tract infections (UTIs). They usually get better if treated with medicines (antibiotics) that kill germs. Take all the medicine until it is gone. You or your child may feel better in a few days, but TAKE ALL MEDICINE or the infection may not respond and may become more difficult to treat. HOME CARE INSTRUCTIONS   Drink enough water and fluids to keep the urine clear or pale yellow. Cranberry juice is especially recommended, in addition to large amounts of water.   Avoid caffeine, tea, and carbonated beverages. They tend to irritate the bladder.   Alcohol may irritate the prostate.   Only take over-the-counter or prescription medicines for pain, discomfort, or fever as directed by your caregiver.  To prevent further infections:  Empty the bladder often. Avoid holding urine for long periods of time.   After a bowel movement, women should cleanse from front to back. Use each tissue only once.   Empty the bladder before and after sexual intercourse.  FINDING OUT THE RESULTS OF YOUR TEST Not all test results are available during your visit. If your or your child's test results are not back during the visit, make an appointment with your caregiver to find out the results. Do not assume everything is normal if you have not heard from your caregiver or the medical facility. It is important for you to follow up on all test results. SEEK MEDICAL CARE IF:   There is back pain.   Your baby is older than 3 months with a rectal temperature of 100.5 F (38.1 C) or higher for more  than 1 day.   Your or your child's problems (symptoms) are no better in 3 days. Return sooner if you or your child is getting worse.  SEEK IMMEDIATE MEDICAL CARE IF:   There is severe back pain or lower abdominal pain.   You or your child develops chills.   You have a fever.   Your baby is older than 3 months with a rectal temperature of 102 F (38.9 C) or higher.   Your baby is 3 months old or younger with a rectal temperature of 100.4 F (38 C) or higher.   There is nausea or vomiting.   There is continued burning or discomfort with urination.  MAKE SURE YOU:   Understand these instructions.   Will watch your condition.   Will get help right away if you are not doing well or get worse.  Document Released: 12/25/2004 Document Revised: 03/06/2011 Document Reviewed: 07/30/2006 [employer] Patient Information 2012 [employer]. 

## 2011-09-22 NOTE — Progress Notes (Signed)
  Subjective:    Patient ID: [name], female    DOB: 1915/08/17, 78 y.o.   MRN: [id number]  HPI [name] is a 78 y.o. female  Dysuria, urinary frequency - past 2-3 days. Sweating at times, doesn't think drank enough liquid before this started.   Primary provider - [facility].   Tx: cranberry pills.  Noise in ears - has hearing aids - has appt tomorrow with hearing aid center tomorrow.     Review of Systems  Constitutional: Negative for fever and chills.  Gastrointestinal: Negative for nausea, vomiting and abdominal pain.  Genitourinary: Positive for frequency.       Objective:   Physical Exam  Constitutional: She appears well-developed and well-nourished.  HENT:  Head: Normocephalic and atraumatic.  Cardiovascular: Normal rate and regular rhythm.   Pulmonary/Chest: Effort normal.  Abdominal: Soft. Bowel sounds are normal. She exhibits no distension. There is no tenderness. There is no rebound and no CVA tenderness.  Skin: Skin is warm and dry. No rash noted.  Psychiatric: She has a normal mood and affect. Her behavior is normal.   Hard of hearing - had to speak loudly and write instructions/questions - understanding expressed.    Results for orders placed in visit on 09/22/11  POCT UA - MICROSCOPIC ONLY      Component Value Range   WBC, Ur, HPF, POC 20-25     RBC, urine, microscopic 12-16     Bacteria, U Microscopic 1+     Mucus, UA neg     Epithelial cells, urine per micros 0-1     Crystals, Ur, HPF, POC neg     Casts, Ur, LPF, POC neg     Yeast, UA neg    POCT URINALYSIS DIPSTICK      Component Value Range   Color, UA yellow     Clarity, UA cloudy     Glucose, UA neg     Bilirubin, UA neg     Ketones, UA trace     Spec Grav, UA >=1.030     Blood, UA small     pH, UA 5.5     Protein, UA 100     Urobilinogen, UA 0.2     Nitrite, UA neg     Leukocytes, UA small (1+)     Creatinine 0.49 in Jan 2013.      Assessment & Plan:  [name]  is a 78 y.o. female 1. Dysuria  POCT UA - Microscopic Only, POCT urinalysis dipstick  2. UTI (lower urinary tract infection)  Urine culture   Start cipro 250mg  BID for 7 days.  Creatinine ok in January.  Check urine culture. RTC/ER preacutions.  Understanding expressed with written instructions, and speaking loudly.    Follow up with audiologist tomorrow.

## 2011-09-24 LAB — URINE CULTURE
Colony Count: NO GROWTH
Organism ID, Bacteria: NO GROWTH

## 2011-10-20 ENCOUNTER — Telehealth: Payer: Self-pay | Admitting: Internal Medicine

## 2011-10-20 ENCOUNTER — Ambulatory Visit (INDEPENDENT_AMBULATORY_CARE_PROVIDER_SITE_OTHER): Payer: Medicare Other | Admitting: Internal Medicine

## 2011-10-20 ENCOUNTER — Encounter: Payer: Self-pay | Admitting: Internal Medicine

## 2011-10-20 VITALS — BP 162/98 | HR 78 | Temp 98.8°F | Resp 16 | Wt 165.0 lb

## 2011-10-20 DIAGNOSIS — H9319 Tinnitus, unspecified ear: Secondary | ICD-10-CM

## 2011-10-20 DIAGNOSIS — I10 Essential (primary) hypertension: Secondary | ICD-10-CM

## 2011-10-20 NOTE — Progress Notes (Signed)
Subjective:    Patient ID: [name], female    DOB: Dec 17, 1915, 78 y.o.   MRN: [id number]  HPI [name] presents for a problem with worsening tinnitus. She has been refitted with new hearing aids and after adjustment she found that everything was too loud. Ever since she has had persistent noise/tinnitus, over the past several days. She has been taking a sleeping pill for this.  Chart reviewed: she has had intermittently elevated blood pressures over the past two years.   Past Medical History  Diagnosis Date  . History of hiatal hernia   . Mitral valve prolapse   . Arthritis   . Hyperlipemia   . Plantar fasciitis   . Hay fever   . GERD (gastroesophageal reflux disease)   . Palpitations   . Hypertension    Past Surgical History  Procedure Date  . Baldder suspension   . Foot surgery   . Tonsillectomy   . Abdominal hysterectomy    No family history on file. History   Social History  . Marital Status: Widowed    Spouse Name: N/A    Number of Children: N/A  . Years of Education: N/A   Occupational History  . Not on file.   Social History Main Topics  . Smoking status: Never Smoker   . Smokeless tobacco: Never Used  . Alcohol Use: No  . Drug Use: Not on file  . Sexually Active: Not on file   Other Topics Concern  . Not on file   Social History Narrative   Married 47 years - widowed [phone] son, 1 daughter 2 grandsonsLives in her own home, son lives with her. Tries to walk every day. Starting to get out more after ortho injury. I- ADLS.End-of-life issues. DNR and no heroic or extraordinary measures.    Current Outpatient Prescriptions on File Prior to Visit  Medication Sig Dispense Refill  . Calcium Glycerophosphate (PRELIEF PO) Take 1 tablet by mouth daily as needed. For acidic foods      . Cranberry 140-100-3 MG-MG-UNIT CAPS Take 1 capsule by mouth 4 (four) times daily.      . furosemide (LASIX) 20 MG tablet Take 1 tablet (20 mg total) by mouth  daily.  30 tablet  11  . losartan (COZAAR) 50 MG tablet Take 50 mg by mouth daily.      . metoprolol succinate (TOPROL-XL) 100 MG 24 hr tablet Take 100 mg by mouth daily.      . mometasone (NASONEX) 50 MCG/ACT nasal spray Place 2 sprays into the nose daily.  17 g  12  . omeprazole (PRILOSEC) 20 MG capsule Take 20 mg by mouth daily.        . potassium chloride SA (KLOR-CON M20) 20 MEQ tablet Take 2 tablets (40 mEq total) by mouth daily.  60 tablet  6  . temazepam (RESTORIL) 15 MG capsule Take 1 capsule (15 mg total) by mouth at bedtime as needed for sleep.  30 capsule  2  . DISCONTD: losartan (COZAAR) 50 MG tablet Take 1 tablet (50 mg total) by mouth daily.  30 tablet  11  . DISCONTD: metoprolol succinate (TOPROL-XL) 100 MG 24 hr tablet TAKE 1 TABLET EVERY DAY  30 tablet  5      Review of Systems System review is negative for any constitutional, cardiac, pulmonary, GI or neuro symptoms or complaints other than as described in the HPI.     Objective:   Physical Exam Filed Vitals:   10/20/11  1640  BP: 162/98  Pulse: 78  Temp: 98.8 F (37.1 C)  Resp: 16   gen'l- elderly but well preserved white woman in no distress HEENT_ deferred to recent exam by audiologist - no wax or abnormality Cor- 2+ radial pulse, RRR Pulm - good breath sounds w/o wheeze Neuro - HOH!!! Otherwise non focal exam.        Assessment & Plan:

## 2011-10-20 NOTE — Telephone Encounter (Signed)
Caller: [name]; PCP: Illene Regulus; CB#: [phone]; ; ; Call regarding Ringing  in Ears;  onset around 7-813 of ringing in ears which is getting worse, no pain or balance problems  All emergent sxs per Ear Symptoms protocols R/O except for severe, persistent tinnitus and not previously evlauated  Appt made for 7-22 at 1630 with Dr Barnetta Chapel

## 2011-10-20 NOTE — Assessment & Plan Note (Signed)
BP Readings from Last 3 Encounters:  10/20/11 162/98  09/22/11 148/74  08/11/11 126/70   Variable BP.   Plan No change in medication at this time  Monitor BP at home.

## 2011-10-20 NOTE — Assessment & Plan Note (Signed)
New onset July '13: a variety of sounds, loud enough to disturb sleep.  Plan White sound generator  ENT consult to r/o underlying cause.

## 2011-10-20 NOTE — Patient Instructions (Addendum)
Tinnitus - noise in the ears. Plan - ENT evaluation to rule out any ear injury that can be treated.  Blood pressure - you have a history of variable blood pressure. Plan Monitor BP at home. If the systolic BP (top number) is consistently above 150 let me know and we will change your medication.   Tinnitus Sounds you hear in your ears and coming from within the ear is called tinnitus. This can be a symptom of many ear disorders. It is often associated with hearing loss.   Tinnitus can be seen with:  Infections.   Ear blockages such as wax buildup.   Meniere's disease.   Ear damage.   Inherited.   Occupational causes.  While irritating, it is not usually a threat to health. When the cause of the tinnitus is wax, infection in the middle ear, or foreign body it is easily treated. Hearing loss will usually be reversible.   TREATMENT   When treating the underlying cause does not get rid of tinnitus, it may be necessary to get rid of the unwanted sound by covering it up with more pleasant background noises. This may include music, the radio etc. There are tinnitus maskers which can be worn which produce background noise to cover up the tinnitus. Avoid all medications which tend to make tinnitus worse such as alcohol, caffeine, aspirin, and nicotine. There are many soothing background tapes such as rain, ocean, thunderstorms, etc. These soothing sounds help with sleeping or resting. Keep all follow-up appointments and referrals. This is important to identify the cause of the problem. It also helps avoid complications, impaired hearing, disability, or chronic pain. Document Released: 03/17/2005 Document Revised: 03/06/2011 Document Reviewed: 11/03/2007 [facility] Patient Information 2012 [place], [place].

## 2011-10-29 ENCOUNTER — Encounter (HOSPITAL_COMMUNITY): Payer: Self-pay | Admitting: *Deleted

## 2011-10-29 ENCOUNTER — Emergency Department (HOSPITAL_COMMUNITY)
Admission: EM | Admit: 2011-10-29 | Discharge: 2011-10-29 | Disposition: A | Discharge status: Home / Self Care | Payer: Medicare Other | Attending: Emergency Medicine | Admitting: Emergency Medicine

## 2011-10-29 DIAGNOSIS — I059 Rheumatic mitral valve disease, unspecified: Secondary | ICD-10-CM | POA: Insufficient documentation

## 2011-10-29 DIAGNOSIS — K219 Gastro-esophageal reflux disease without esophagitis: Secondary | ICD-10-CM | POA: Insufficient documentation

## 2011-10-29 DIAGNOSIS — I10 Essential (primary) hypertension: Secondary | ICD-10-CM | POA: Insufficient documentation

## 2011-10-29 DIAGNOSIS — M129 Arthropathy, unspecified: Secondary | ICD-10-CM | POA: Insufficient documentation

## 2011-10-29 DIAGNOSIS — E785 Hyperlipidemia, unspecified: Secondary | ICD-10-CM | POA: Insufficient documentation

## 2011-10-29 DIAGNOSIS — M25569 Pain in unspecified knee: Secondary | ICD-10-CM | POA: Insufficient documentation

## 2011-10-29 MED ORDER — PREDNISONE 10 MG PO TABS: 20.0000 mg | ORAL_TABLET | Freq: Every day | ORAL | Status: DC

## 2011-10-29 NOTE — ED Notes (Signed)
Pt came to ED because she can't get into see Dr Carola Frost until next week. States Dr Carola Frost has done cortisone injections for her knee pain in the past but her PMD suggested pt have another type of treatment. Pt comes to ED with suggestion written on note for PMD. No injury. States she wants to go ahead and try it.

## 2011-10-29 NOTE — ED Provider Notes (Signed)
History    This chart was scribed for Toy Baker, MD, MD by Smitty Pluck. The patient was seen in room TR05C and the patient's care was started at 4:54PM.   CSN: [id number]  Arrival date & time 10/29/11  1429   None     Chief Complaint  Patient presents with  . Knee Pain    (Consider location/radiation/quality/duration/timing/severity/associated sxs/prior treatment) Patient is a 78 y.o. female presenting with knee pain. The history is provided by the patient.  Knee Pain Pertinent negatives include no shortness of breath.   [name] is a 78 y.o. female who presents to the Emergency Department complaining of chronic left knee pain for more than a year. Pt has appointment with Dr. Carola Frost 1 week for cortisone injection but wants relief before appointment. Denies recent injury to left knee. Pain is aggravated by walking. Pt has taken tylenol without relief. Denies radiation.    Past Medical History  Diagnosis Date  . History of hiatal hernia   . Mitral valve prolapse   . Arthritis   . Hyperlipemia   . Plantar fasciitis   . Hay fever   . GERD (gastroesophageal reflux disease)   . Palpitations   . Hypertension     Past Surgical History  Procedure Date  . Baldder suspension   . Foot surgery   . Tonsillectomy   . Abdominal hysterectomy     History reviewed. No pertinent family history.  History  Substance Use Topics  . Smoking status: Never Smoker   . Smokeless tobacco: Never Used  . Alcohol Use: No    OB History    Grav Para Term Preterm Abortions TAB SAB Ect Mult Living                  Review of Systems  Constitutional: Negative for fever and chills.  Respiratory: Negative for shortness of breath.   Gastrointestinal: Negative for nausea and vomiting.  Musculoskeletal: Positive for joint swelling.  Neurological: Negative for weakness.  All other systems reviewed and are negative.    Allergies  Aleve  Home Medications   Current Outpatient  Rx  Name Route Sig Dispense Refill  . PRELIEF PO Oral Take 1 tablet by mouth daily as needed. For acidic foods    . CRANBERRY 140-100-3 MG-MG-UNIT PO CAPS Oral Take 1 capsule by mouth 4 (four) times daily.    . FUROSEMIDE 20 MG PO TABS Oral Take 1 tablet (20 mg total) by mouth daily. 30 tablet 11  . LOSARTAN POTASSIUM 50 MG PO TABS Oral Take 50 mg by mouth daily.    Marland Kitchen METOPROLOL SUCCINATE ER 100 MG PO TB24 Oral Take 100 mg by mouth daily.    . MOMETASONE FUROATE 50 MCG/ACT NA SUSP Nasal Place 2 sprays into the nose daily. 17 g 12  . OMEPRAZOLE 20 MG PO CPDR Oral Take 20 mg by mouth daily.      Marland Kitchen POTASSIUM CHLORIDE CRYS ER 20 MEQ PO TBCR Oral Take 2 tablets (40 mEq total) by mouth daily. 60 tablet 6  . TEMAZEPAM 15 MG PO CAPS Oral Take 1 capsule (15 mg total) by mouth at bedtime as needed for sleep. 30 capsule 2    BP 171/89  Pulse 87  Temp 98.2 F (36.8 C) (Oral)  Resp 16  SpO2 95%  Physical Exam  Nursing note and vitals reviewed. Constitutional: She is oriented to person, place, and time. She appears well-developed and well-nourished. No distress.  HENT:  Head:  Normocephalic and atraumatic.  Eyes: Conjunctivae are normal.  Neck: Normal range of motion. Neck supple.  Pulmonary/Chest: Effort normal. No respiratory distress.  Musculoskeletal:       Full rom of left knee  No patellar effusion of left knee Skin of left knee is intact Skin of left knee is not warm   Neurological: She is alert and oriented to person, place, and time.  Skin: Skin is warm and dry.  Psychiatric: She has a normal mood and affect. Her behavior is normal.    ED Course  Procedures (including critical care time) DIAGNOSTIC STUDIES: Oxygen Saturation is 95% on room air, normal by my interpretation.    COORDINATION OF CARE: 5:03PM EDP discusses pt ED treatment with pt     Labs Reviewed - No data to display No results found.   No diagnosis found.    MDM  Pt given rx for cortisone  I  personally performed the services described in this documentation, which was scribed in my presence. The recorded information has been reviewed and considered.        Toy Baker, MD 10/29/11 [phone]

## 2011-11-04 ENCOUNTER — Telehealth: Payer: Self-pay | Admitting: Internal Medicine

## 2011-11-04 NOTE — Telephone Encounter (Signed)
She said Dr Debby Bud told her to call if her blood pressure went up over 150  It has been ranging from 155-178/90's   She denies any chest pain or shortness of breath, no headache.  She said her doctor wanted to know if her blood pressure went up and then she hung up  She was very difficult to communicate with due to hearing.  She saw MD on 10-20-11

## 2011-11-05 MED ORDER — FUROSEMIDE 40 MG PO TABS: 40.0000 mg | ORAL_TABLET | Freq: Every day | ORAL | Status: DC

## 2011-11-05 NOTE — Telephone Encounter (Signed)
Call patient: increase lasix to 40 mg daily (2 x 20 mg until gone). Please change med record. Call back in 5-7 days with BP readings.

## 2011-11-05 NOTE — Telephone Encounter (Signed)
Spoke with patient son [place]. Notified of medication change in lasix to increase to total of 40mg  daily. Changed on medication record.

## 2011-11-14 ENCOUNTER — Other Ambulatory Visit: Payer: Self-pay | Admitting: Internal Medicine

## 2011-11-18 ENCOUNTER — Other Ambulatory Visit (INDEPENDENT_AMBULATORY_CARE_PROVIDER_SITE_OTHER): Payer: Medicare Other

## 2011-11-18 ENCOUNTER — Encounter: Payer: Self-pay | Admitting: Internal Medicine

## 2011-11-18 ENCOUNTER — Ambulatory Visit (INDEPENDENT_AMBULATORY_CARE_PROVIDER_SITE_OTHER): Payer: Medicare Other | Admitting: Internal Medicine

## 2011-11-18 VITALS — BP 134/80 | HR 67 | Temp 98.2°F | Resp 16 | Wt 163.0 lb

## 2011-11-18 DIAGNOSIS — R7309 Other abnormal glucose: Secondary | ICD-10-CM

## 2011-11-18 DIAGNOSIS — R209 Unspecified disturbances of skin sensation: Secondary | ICD-10-CM

## 2011-11-18 DIAGNOSIS — R202 Paresthesia of skin: Secondary | ICD-10-CM

## 2011-11-18 DIAGNOSIS — R739 Hyperglycemia, unspecified: Secondary | ICD-10-CM

## 2011-11-18 LAB — VITAMIN B12: Vitamin B-12: 789 pg/mL (ref 211–911)

## 2011-11-18 LAB — HEMOGLOBIN A1C: Hgb A1c MFr Bld: 6 % (ref 4.6–6.5)

## 2011-11-18 LAB — TSH: TSH: 1.92 u[IU]/mL (ref 0.35–5.50)

## 2011-11-18 MED ORDER — GABAPENTIN 100 MG PO CAPS: 100.0000 mg | ORAL_CAPSULE | Freq: Three times a day (TID) | ORAL | Status: DC

## 2011-11-18 NOTE — Progress Notes (Signed)
  Subjective:    Patient ID: [name], female    DOB: 07-Nov-1915, 78 y.o.   MRN: [id number]  HPI [name] presents for a burning pain in her feet to the degree that she has trouble walking. No injury, no sores, no h/o DM.  PMH, FamHx and SocHx reviewed for any changes and relevance. meds - reviewed   Review of Systems System review is negative for any constitutional, cardiac, pulmonary, GI or neuro symptoms or complaints other than as described in the HPI.     Objective:   Physical Exam Filed Vitals:   11/18/11 1144  BP: 134/80  Pulse: 67  Temp: 98.2 F (36.8 C)  Resp: 16   Gen'l- very well preserved nonogenarian. Cor- RRR, no peripheral edema Pulm - normal respirations Ext - feet without deformity, injury, swelling Neuro - foot sensation normal to light touch.      Assessment & Plan:  Foot pain - suspect idiopathic peripheral neuropathy  Plan Lab: B12, TSH  Rx: gabapentin starting at 100 mg qHS advancing slowly to 100 mg TID if needed.   Addendum: B12 normal at 789, TSH normal 1.92, A1C normal 6%

## 2011-11-18 NOTE — Patient Instructions (Addendum)
Burning feet and pain with weight bearing - Plan Lab: B12, Thyroid labs and a check for diabetes  Medication: gabapentin 100 mg at bedtime. If after 3 days there is still pain increase to 100 mg twice a day. If after 3 days still having pain increase to three times a day.    Peripheral Neuropathy Peripheral neuropathy is a common disorder of your nerves resulting from damage. CAUSES   This disorder may be caused by a disease of the nerves or illness. Many neuropathies have well known causes such as:  Diabetes. This is one of the most common causes.     Uremia.    AIDS.    Nutritional deficiencies.     Other causes include mechanical pressures. These may be from:     Compression.    Injury.    Contusions or bruises.     Fracture or dislocated bones.     Pressure involving the nerves close to the surface. Nerves such as the ulnar, or radial can be injured by prolonged use of crutches.  Other injuries may come from:  Tumor.     Hemorrhage or bleeding into a nerve.     Exposure to cold or radiation.     Certain medicines or toxic substances (rare).     Vascular or collagen disorders such as:     Atherosclerosis.    Systemic lupus erythematosus.     Scleroderma.    Sarcoidosis.    Rheumatoid arthritis.     Polyarteritis nodosa.     A large number of cases are of unknown cause.  SYMPTOMS   Common problems include:  Weakness.     Numbness.    Abnormal sensations (paresthesia) such as:     Burning.    Tickling.    Pricking.    Tingling.    Pain in the arms, hands, legs and/or feet.  TREATMENT   Therapy for this disorder differs depending on the cause. It may vary from medical treatment with medications or physical therapy among others.    For example, therapy for this disorder caused by diabetes involves control of the diabetes.     In cases where a tumor or ruptured disc is the cause, therapy may involve surgery. This would be to remove the tumor  or to repair the ruptured disc.     In entrapment or compression neuropathy, treatment may consist of splinting or surgical decompression of the ulnar or median nerves. A common example of entrapment neuropathy is carpal tunnel syndrome. This has become more common because of the increasing use of computers.     Peroneal and radial compression neuropathies may require avoidance of pressure.     Physical therapy and/or splints may be useful in preventing contractures. This is a condition in which shortened muscles around joints cause abnormal and sometimes painful positioning of the joints.  Document Released: 03/07/2002 Document Revised: 11/27/2010 Document Reviewed: 03/17/2005 [facility] Patient Information 2012 [place], [place].

## 2011-11-24 ENCOUNTER — Encounter: Payer: Self-pay | Admitting: Internal Medicine

## 2011-11-25 ENCOUNTER — Telehealth: Payer: Self-pay | Admitting: Internal Medicine

## 2011-11-25 NOTE — Telephone Encounter (Signed)
Caller: Kallee/Patient; Phone: [phone]; Reason for Call: Calling for lab results.

## 2011-11-25 NOTE — Telephone Encounter (Signed)
Pt advised of result and letter

## 2011-11-29 ENCOUNTER — Other Ambulatory Visit: Payer: Self-pay | Admitting: Internal Medicine

## 2011-12-02 ENCOUNTER — Telehealth: Payer: Self-pay | Admitting: Internal Medicine

## 2011-12-02 NOTE — Telephone Encounter (Signed)
Left message on answer machine of call.No answer

## 2011-12-02 NOTE — Telephone Encounter (Signed)
The pt is hoping to speak with the nurse at your earliest convenience.  Her call back number is [phone]  Thanks!

## 2011-12-03 ENCOUNTER — Ambulatory Visit: Payer: Medicare Other | Admitting: Sports Medicine

## 2011-12-09 ENCOUNTER — Other Ambulatory Visit: Payer: Self-pay | Admitting: Internal Medicine

## 2011-12-09 ENCOUNTER — Ambulatory Visit (INDEPENDENT_AMBULATORY_CARE_PROVIDER_SITE_OTHER): Payer: Medicare Other | Admitting: Sports Medicine

## 2011-12-09 ENCOUNTER — Encounter: Payer: Self-pay | Admitting: Sports Medicine

## 2011-12-09 VITALS — BP 130/86 | HR 87 | Ht 67.5 in | Wt 163.0 lb

## 2011-12-09 DIAGNOSIS — M25569 Pain in unspecified knee: Secondary | ICD-10-CM

## 2011-12-09 DIAGNOSIS — M1710 Unilateral primary osteoarthritis, unspecified knee: Secondary | ICD-10-CM

## 2011-12-09 MED ORDER — SODIUM HYALURONATE (VISCOSUP) 25 MG/2.5ML IX SOLN
25.0000 mg | Freq: Once | INTRA_ARTICULAR | Status: AC
  Administered 2011-12-09: 25 mg via INTRA_ARTICULAR

## 2011-12-09 NOTE — Progress Notes (Signed)
  [facility]  Patient name: [name] MRN [id number]  Date of birth: Jun 16, 1915  CC & HPI:  [name] is a 78 y.o. female presenting today for Supartz injection via referral from Dr Carola Frost and her PCP Dr.  Debby Bud.  Pt has long standing L knee DJD and underwent an knee arthroscopy in April 2012 by Dr Carola Frost.  She has had repetitive corticosteroid injections and receives between 2 and 6 weeks of relief with each of these injections.  Her L knee pain is diffuse and described as a deep ache.  Denies knee clicking, locking, buckling.  Denies any recent falls.  ROS:  Per HPI  Pertinent History Reviewed:  Medical & Surgical Hx:  Reviewed: Significant for HTN, HLD Medications: Reviewed & Updated - see associated section Social History: Reviewed - Significant for non smoker  Objective Findings:  Vitals:  Filed Vitals:   12/09/11 1008  BP: 130/86  Pulse: 87    PE: GENERAL:  Elderly (appearance less than actual age) female. In no discomfort; no respiratory distress. Hard of hearing. KNEE: R knee, no patellar grind; L knee, hypertrophied joint with crepitation and patellar grind, no joint line tenderness,negative A/P drawer, negative McMurray's,  Post surgical port scars over anterolateral and anteromedial areas. Walks with a slight limp.  CHART REVIEWED: Tricompartmental degenerative changes of the Left knee noted on X-ray on 11/15/2010.  Dr. Carola Frost performed L knee arthroscopy do to medial and lateral meniscal tears, with medial lateral meniscectomies, chondroplasty of the medial patellofemoral compartment, and extensive synovectomy.  This was performed 07/15/2010.    Assessment & Plan:

## 2011-12-09 NOTE — Assessment & Plan Note (Deleted)
Suppartz 1/3 provided today.  After consent was obtained, using aseptic technique the L knee was prepped and the joint was entered using 3cc of 1% Lidocaine to numb the tract and to ensure joint localization.  The Suppartz injection was then administered.  The procedure was well tolerated and the patient reported improvement in her pain prior to leaving  Bioflex knee sleeve was attempted to be fit.  However, pt declined the sleeve.

## 2011-12-10 ENCOUNTER — Other Ambulatory Visit: Payer: Self-pay | Admitting: General Practice

## 2011-12-10 DIAGNOSIS — M1710 Unilateral primary osteoarthritis, unspecified knee: Secondary | ICD-10-CM | POA: Insufficient documentation

## 2011-12-10 DIAGNOSIS — R202 Paresthesia of skin: Secondary | ICD-10-CM

## 2011-12-10 MED ORDER — FUROSEMIDE 40 MG PO TABS: 80.0000 mg | ORAL_TABLET | Freq: Every day | ORAL | Status: DC

## 2011-12-10 MED ORDER — FUROSEMIDE 80 MG PO TABS: 80.0000 mg | ORAL_TABLET | Freq: Every day | ORAL | Status: DC

## 2011-12-10 NOTE — Assessment & Plan Note (Signed)
Please see knee pain section.

## 2011-12-10 NOTE — Assessment & Plan Note (Addendum)
Since that time patient has received multiple cortisone injections with only short interval of moderate  improvement in her symptoms.  Dr. Carola Frost and Dr. Debby Bud feel the patient may benefit from Supartz injections.  We will proceed with that today.  Suppartz 1 of 3 provided today.  After consent was obtained, using aseptic technique the L knee was prepped and the joint was entered using 3cc of 1% Lidocaine to numb the tract and to ensure joint localization.Anteriolateral approach.  The Supartz injection was then administered.  The procedure was well tolerated and the patient reported improvement in her pain prior to leaving. Return to clinic in 1 week for second injection  Bioflex knee sleeve was attempted to be fit.  However, pt declined the sleeve.  Patient will follow up next week in the week following for her second and third injections.

## 2011-12-16 ENCOUNTER — Ambulatory Visit (INDEPENDENT_AMBULATORY_CARE_PROVIDER_SITE_OTHER): Payer: Medicare Other | Admitting: Sports Medicine

## 2011-12-16 VITALS — BP 120/70 | Ht 67.0 in | Wt 163.0 lb

## 2011-12-16 DIAGNOSIS — M1710 Unilateral primary osteoarthritis, unspecified knee: Secondary | ICD-10-CM

## 2011-12-16 MED ORDER — SODIUM HYALURONATE (VISCOSUP) 25 MG/2.5ML IX SOLN
2.5000 mg | INTRA_ARTICULAR | Status: DC
  Administered 2011-12-16: 2.5 mg via INTRA_ARTICULAR

## 2011-12-16 NOTE — Progress Notes (Signed)
  [facility]  Patient name: [name] MRN [id number]  Date of birth: 1915-09-27  SUPARTZ INJECTION   [name] is a 78 y.o. female presenting today for Follow-Up Supartz Injection.  she has radiographic evidence of Osteoarthritis/Degenerative Joint Disease of the knee.  she has undergone corticosteroid injection previously without sufficient relief from pain.   The risks, benefits, and expected outcomes of the injection were reviewed and she wishes to undergo a therapeutic trial of Supartz.   The knee was prepped in a clean fashion the knee was injected from a inferior Lateral, bent knee approach with a 22g syringe using 5cc of marcaine.  The Supartz suspension was then injected.  A bandaid was applied to the area.  This procedure was well tolerated and there were no complications.  Today's injection was her second.  The patient is to return in 1 week for repeat injection.  Plan for trial of 3 total injections for now.

## 2011-12-21 ENCOUNTER — Encounter (HOSPITAL_COMMUNITY): Payer: Self-pay | Admitting: *Deleted

## 2011-12-21 ENCOUNTER — Emergency Department (HOSPITAL_COMMUNITY)
Admission: EM | Admit: 2011-12-21 | Discharge: 2011-12-21 | Disposition: A | Discharge status: Home / Self Care | Payer: Medicare Other | Attending: Emergency Medicine | Admitting: Emergency Medicine

## 2011-12-21 DIAGNOSIS — Z888 Allergy status to other drugs, medicaments and biological substances status: Secondary | ICD-10-CM | POA: Insufficient documentation

## 2011-12-21 DIAGNOSIS — K219 Gastro-esophageal reflux disease without esophagitis: Secondary | ICD-10-CM | POA: Insufficient documentation

## 2011-12-21 DIAGNOSIS — I10 Essential (primary) hypertension: Secondary | ICD-10-CM | POA: Insufficient documentation

## 2011-12-21 DIAGNOSIS — R110 Nausea: Secondary | ICD-10-CM | POA: Insufficient documentation

## 2011-12-21 LAB — URINALYSIS, ROUTINE W REFLEX MICROSCOPIC
Bilirubin Urine: NEGATIVE
Ketones, ur: NEGATIVE mg/dL
Nitrite: NEGATIVE
Protein, ur: NEGATIVE mg/dL
Urobilinogen, UA: 0.2 mg/dL (ref 0.0–1.0)
pH: 5.5 (ref 5.0–8.0)

## 2011-12-21 MED ORDER — ONDANSETRON 4 MG PO TBDP: ORAL_TABLET | ORAL | Status: DC

## 2011-12-21 NOTE — ED Notes (Signed)
The patient is AOx4 and comfortable with her discharge instructions. 

## 2011-12-21 NOTE — ED Notes (Signed)
To ED for eval of urinary freq and burning for the past 2 days. Alert and oriented.

## 2011-12-21 NOTE — ED Notes (Signed)
Per the MD, the patient's urinary frequency was determined to be caused by her ingestion of prescription diuretics.

## 2011-12-21 NOTE — ED Provider Notes (Signed)
History     CSN: [id number]  Arrival date & time 12/21/11  Paulo Fruit   First MD Initiated Contact with Patient 12/21/11 2018      Chief Complaint  Patient presents with  . Urinary Frequency    (Consider location/radiation/quality/duration/timing/severity/associated sxs/prior treatment) The history is provided by the patient.   patient is a 78 year old, female, who presents emergency department complaining of nausea but no vomiting earlier today, which has improved.  She also has frequency of urination, and a little bit of diarrhea.  She denies pain anywhere.  She has not had a fever, chills, cough, or shortness of breath.  She denies dysuria.  Past Medical History  Diagnosis Date  . History of hiatal hernia   . Mitral valve prolapse   . Arthritis   . Hyperlipemia   . Plantar fasciitis   . Hay fever   . GERD (gastroesophageal reflux disease)   . Palpitations   . Hypertension     Past Surgical History  Procedure Date  . Baldder suspension   . Foot surgery   . Tonsillectomy   . Abdominal hysterectomy     History reviewed. No pertinent family history.  History  Substance Use Topics  . Smoking status: Never Smoker   . Smokeless tobacco: Never Used  . Alcohol Use: No    OB History    Grav Para Term Preterm Abortions TAB SAB Ect Mult Living                  Review of Systems  Unable to perform ROS Constitutional: Negative for fever, chills and diaphoresis.  HENT: Negative for neck pain.   Respiratory: Negative for cough, chest tightness and shortness of breath.   Cardiovascular: Negative for chest pain.  Gastrointestinal: Positive for nausea and diarrhea. Negative for vomiting and abdominal pain.  Genitourinary: Positive for frequency. Negative for dysuria.  Neurological: Negative for headaches.  Psychiatric/Behavioral: Negative for confusion.  All other systems reviewed and are negative.    Allergies  Aleve  Home Medications   Current Outpatient Rx  Name  Route Sig Dispense Refill  . PRELIEF PO Oral Take 1 tablet by mouth daily as needed. For acidic foods    . CRANBERRY 140-100-3 MG-MG-UNIT PO CAPS Oral Take 1 capsule by mouth 4 (four) times daily.    . FUROSEMIDE 80 MG PO TABS Oral Take 80 mg by mouth daily.    Marland Kitchen GABAPENTIN 100 MG PO CAPS Oral Take 100 mg by mouth 3 (three) times daily. Take one capsule at bedtime. For continued pain increase to three times a day.    Marland Kitchen LOSARTAN POTASSIUM 50 MG PO TABS Oral Take 50 mg by mouth daily.    Marland Kitchen METOPROLOL SUCCINATE ER 100 MG PO TB24 Oral Take 100 mg by mouth daily.    . MOMETASONE FUROATE 50 MCG/ACT NA SUSP Nasal Place 2 sprays into the nose daily. 17 g 12  . OMEPRAZOLE 20 MG PO CPDR Oral Take 20 mg by mouth daily.      Marland Kitchen POTASSIUM CHLORIDE CRYS ER 20 MEQ PO TBCR Oral Take 40 mEq by mouth 2 (two) times daily.    Marland Kitchen PREDNISONE 10 MG PO TABS Oral Take 20 mg by mouth daily.      BP 159/76  Pulse 74  Temp 98 F (36.7 C) (Oral)  Resp 16  SpO2 98%  Physical Exam  Nursing note and vitals reviewed. Constitutional: She is oriented to person, place, and time. She appears well-developed and well-nourished.  No distress.  HENT:  Head: Normocephalic and atraumatic.  Eyes: Conjunctivae normal and EOM are normal.  Neck: Normal range of motion. Neck supple.  Cardiovascular: Normal rate, regular rhythm and intact distal pulses.   Murmur heard. Pulmonary/Chest: Effort normal and breath sounds normal.  Abdominal: Soft. Bowel sounds are normal.  Musculoskeletal: Normal range of motion.  Neurological: She is alert and oriented to person, place, and time.  Skin: Skin is warm and dry.  Psychiatric: She has a normal mood and affect. Thought content normal.    ED Course  Procedures (including critical care time) nausea, diarrhea and frequency.  No vomiting, or dysuria.  No fever.  No pain anywhere.  We will check a urinalysis.  No other tests are indicated.  Labs Reviewed  URINALYSIS, ROUTINE W REFLEX  MICROSCOPIC - Abnormal; Notable for the following:    Leukocytes, UA SMALL (*)     All other components within normal limits  URINE MICROSCOPIC-ADD ON   No results found.   No diagnosis found.    MDM  Nausea No evidence of urinary tract infection, or other systemic illness.        Cheri Guppy, MD 12/21/11 2038

## 2011-12-23 ENCOUNTER — Ambulatory Visit (INDEPENDENT_AMBULATORY_CARE_PROVIDER_SITE_OTHER): Payer: Medicare Other | Admitting: Sports Medicine

## 2011-12-23 VITALS — BP 108/60 | Ht 67.0 in | Wt 165.0 lb

## 2011-12-23 DIAGNOSIS — M1710 Unilateral primary osteoarthritis, unspecified knee: Secondary | ICD-10-CM

## 2011-12-23 NOTE — Progress Notes (Signed)
  [facility]  Patient name: [name] MRN [id number]  Date of birth: [birth year]  SUPARTZ INJECTION   [name] is a 78 y.o. female presenting today for Follow-Up Supartz Injection.  she has radiographic evidence of Osteoarthritis/Degenerative Joint Disease of the knee.  she has undergone corticosteroid injection previously without sufficient relief from pain.   The risks, benefits, and expected outcomes of the injection were reviewed and she wishes to undergo a therapeutic trial of Supartz.   The knee was prepped in a clean fashion the knee was injected from a inferior Lateral, bent knee approach with a 22g syringe using 5cc of marcaine.  The Supartz suspension was then injected.  A bandaid was applied to the area.  This procedure was well tolerated and there were no complications.  Today's injection was her third.  The patient is to return in 1 month for follow up evaluation and consideration of 4th and 5th injections if improvement but not resolution of her pain.

## 2012-01-12 ENCOUNTER — Other Ambulatory Visit: Payer: Self-pay | Admitting: Internal Medicine

## 2012-01-20 ENCOUNTER — Ambulatory Visit (INDEPENDENT_AMBULATORY_CARE_PROVIDER_SITE_OTHER): Payer: Medicare Other | Admitting: Sports Medicine

## 2012-01-20 VITALS — BP 144/72 | Ht 67.0 in | Wt 163.0 lb

## 2012-01-20 DIAGNOSIS — M1710 Unilateral primary osteoarthritis, unspecified knee: Secondary | ICD-10-CM

## 2012-01-20 NOTE — Progress Notes (Signed)
  Subjective:    Patient ID: [name], female    DOB: 07/05/1915, 78 y.o.   MRN: [id number]  HPI Patient comes in today for followup on left knee osteoarthritis. She is status post 3 injections of Supartz. Today she is feeling pretty good. She feels like the injections have helped. Pain has not resolved but is tolerable.    Review of Systems     Objective:   Physical Exam Well-developed, well-nourished. No acute distress. Left knee: Range of motion 0-120. No effusion. No soft tissue swelling. Slight tenderness to palpation along the medial joint line but a negative McMurray's. 2+ patellofemoral crepitus.Neurovascularly intact distally. Walks with the assistance of a cane.       Assessment & Plan:  1. Left knee osteoarthritis  At this point in time we will wait on her fourth and fifth Supartz injections. I would be happy to repeat the entire series down the road if needed. Obviously these injections and not curative but the patient is not a candidate for total knee arthroplasty given her advanced age. Followup when necessary.

## 2012-02-11 ENCOUNTER — Ambulatory Visit (INDEPENDENT_AMBULATORY_CARE_PROVIDER_SITE_OTHER): Payer: Medicare Other | Admitting: Sports Medicine

## 2012-02-11 ENCOUNTER — Encounter: Payer: Self-pay | Admitting: Sports Medicine

## 2012-02-11 ENCOUNTER — Other Ambulatory Visit: Payer: Self-pay | Admitting: Internal Medicine

## 2012-02-11 VITALS — BP 162/84 | HR 74 | Ht 67.0 in | Wt 165.0 lb

## 2012-02-11 DIAGNOSIS — M1710 Unilateral primary osteoarthritis, unspecified knee: Secondary | ICD-10-CM

## 2012-02-11 DIAGNOSIS — M1712 Unilateral primary osteoarthritis, left knee: Secondary | ICD-10-CM

## 2012-02-11 NOTE — Progress Notes (Signed)
  Subjective:    Patient ID: ONEISHA AMMONS, female    DOB: Apr 09, 1915, 78 y.o.   MRN: [id number]  HPI Patient comes in today requesting a fourth Supartz injection into the left knee. She completed an initial 3 shot series a few weeks ago but is still having some intermittent left knee pain. She would like to go ahead and complete the series. She denies any trauma in the interim. He is here today with her son.    Review of Systems     Objective:   Physical Exam Well-developed, well-nourished. No acute distress. Awake alert and oriented x3  Left knee: Full range of motion. 1+ boggy synovitis. Mild tenderness along the medial joint line but a negative McMurray's. 1-2+ patellofemoral crepitus with active extension. Knee is stable to ligamentous exam. She is neurovascularly intact distally. Walks with a slight limp.       Assessment & Plan:  Left knee pain secondary to osteoarthritis  Patient's left knee is injected today with her fourth Supartz injection. After consent was obtained, the knee was draped and prepped in a sterile fashion. An anterior lateral approach was utilized. 3 cc of Xylocaine were injected using a 22-gauge needle into the intra-articular space. This was followed by one vial of Supartz injected through the same needle. Patient tolerated the procedure without difficulty.  She will return to the office next week for her fifth and final injection. Call with questions or concerns in the interim.

## 2012-02-16 ENCOUNTER — Ambulatory Visit: Payer: Medicare Other | Admitting: Sports Medicine

## 2012-02-18 ENCOUNTER — Ambulatory Visit (INDEPENDENT_AMBULATORY_CARE_PROVIDER_SITE_OTHER): Payer: Medicare Other | Admitting: Sports Medicine

## 2012-02-18 ENCOUNTER — Encounter: Payer: Self-pay | Admitting: Sports Medicine

## 2012-02-18 VITALS — BP 149/78 | HR 81 | Ht 67.0 in | Wt 165.0 lb

## 2012-02-18 DIAGNOSIS — M1710 Unilateral primary osteoarthritis, unspecified knee: Secondary | ICD-10-CM

## 2012-02-18 DIAGNOSIS — IMO0002 Reserved for concepts with insufficient information to code with codable children: Secondary | ICD-10-CM

## 2012-02-18 NOTE — Progress Notes (Signed)
  Subjective:    Patient ID: [name], female    DOB: [birth year], 78 y.o.   MRN: [id number]  HPI Patient comes in today for her fifth Supartz injection into her left knee. She is without complaint today. Her birthday is in 2 days and she will be 78 years old.    Review of Systems     Objective:   Physical Exam Left knee exam is unchanged from previous visits.       Assessment & Plan:  1. Left knee pain secondary to DJD  Left knee was injected with her fifth and final Supartz injection. Anterior lateral approach was utilized. Patient's skin was draped and prepped in a sterile fashion. 3 cc of Xylocaine were injected using a 22-gauge 1-1/2 inch needle. Upon entering the knee joint one vial of Supartz was injected. Patient tolerated this without any difficulty. Procedure was done after consent was obtained.  If patient gets good benefit from these injections we can repeat the entire 5 shot series starting again on 06/07/2012. Followup when necessary.

## 2012-03-02 ENCOUNTER — Other Ambulatory Visit: Payer: Self-pay | Admitting: Internal Medicine

## 2012-04-18 ENCOUNTER — Ambulatory Visit (INDEPENDENT_AMBULATORY_CARE_PROVIDER_SITE_OTHER): Payer: Medicare Other | Admitting: Family Medicine

## 2012-04-18 VITALS — BP 107/67 | HR 93 | Temp 99.0°F | Resp 16 | Ht 67.0 in | Wt 171.0 lb

## 2012-04-18 DIAGNOSIS — N390 Urinary tract infection, site not specified: Secondary | ICD-10-CM

## 2012-04-18 DIAGNOSIS — R350 Frequency of micturition: Secondary | ICD-10-CM

## 2012-04-18 LAB — POCT UA - MICROSCOPIC ONLY
Casts, Ur, LPF, POC: NEGATIVE
Crystals, Ur, HPF, POC: NEGATIVE
Mucus, UA: NEGATIVE
Yeast, UA: NEGATIVE

## 2012-04-18 LAB — POCT URINALYSIS DIPSTICK
Bilirubin, UA: NEGATIVE
Glucose, UA: NEGATIVE
Ketones, UA: NEGATIVE
Nitrite, UA: NEGATIVE
Protein, UA: 100
Spec Grav, UA: 1.015
Urobilinogen, UA: 0.2
pH, UA: 7.5

## 2012-04-18 MED ORDER — CIPROFLOXACIN HCL 500 MG PO TABS: 500.0000 mg | ORAL_TABLET | Freq: Two times a day (BID) | ORAL | Status: DC

## 2012-04-18 NOTE — Patient Instructions (Addendum)
Urinary Tract Infection Urinary tract infections (UTIs) can develop anywhere along your urinary tract. Your urinary tract is your body's drainage system for removing wastes and extra water. Your urinary tract includes two kidneys, two ureters, a bladder, and a urethra. Your kidneys are a pair of bean-shaped organs. Each kidney is about the size of your fist. They are located below your ribs, one on each side of your spine. CAUSES Infections are caused by microbes, which are microscopic organisms, including fungi, viruses, and bacteria. These organisms are so small that they can only be seen through a microscope. Bacteria are the microbes that most commonly cause UTIs. SYMPTOMS  Symptoms of UTIs may vary by age and gender of the patient and by the location of the infection. Symptoms in young women typically include a frequent and intense urge to urinate and a painful, burning feeling in the bladder or urethra during urination. Older women and men are more likely to be tired, shaky, and weak and have muscle aches and abdominal pain. A fever may mean the infection is in your kidneys. Other symptoms of a kidney infection include pain in your back or sides below the ribs, nausea, and vomiting. DIAGNOSIS To diagnose a UTI, your caregiver will ask you about your symptoms. Your caregiver also will ask to provide a urine sample. The urine sample will be tested for bacteria and white blood cells. White blood cells are made by your body to help fight infection. TREATMENT  Typically, UTIs can be treated with medication. Because most UTIs are caused by a bacterial infection, they usually can be treated with the use of antibiotics. The choice of antibiotic and length of treatment depend on your symptoms and the type of bacteria causing your infection. HOME CARE INSTRUCTIONS  If you were prescribed antibiotics, take them exactly as your caregiver instructs you. Finish the medication even if you feel better after you  have only taken some of the medication.  Drink enough water and fluids to keep your urine clear or pale yellow.  Avoid caffeine, tea, and carbonated beverages. They tend to irritate your bladder.  Empty your bladder often. Avoid holding urine for long periods of time.  Empty your bladder before and after sexual intercourse.  After a bowel movement, women should cleanse from front to back. Use each tissue only once. SEEK MEDICAL CARE IF:   You have back pain.  You develop a fever.  Your symptoms do not begin to resolve within 3 days. SEEK IMMEDIATE MEDICAL CARE IF:   You have severe back pain or lower abdominal pain.  You develop chills.  You have nausea or vomiting.  You have continued burning or discomfort with urination. MAKE SURE YOU:   Understand these instructions.  Will watch your condition.  Will get help right away if you are not doing well or get worse. Document Released: 12/25/2004 Document Revised: 09/16/2011 Document Reviewed: 04/25/2011 [employer] Patient Information 2013 [employer].  

## 2012-04-18 NOTE — Progress Notes (Signed)
Patient ID: [name] MRN: [id number], DOB: 03/23/1916, 79 y.o. Date of Encounter: 04/18/2012, 8:42 AM  Primary Physician: Illene Regulus, MD  Chief Complaint:  Chief Complaint  Patient presents with  . Urinary Tract Infection    yesterday    HPI: 79 y.o. year old female presents with 2 day history of dysuria, urgency, and frequency. Last UTI was over a year ago No hematuria Pt defecated while waiting for son to return to car when shopping 2 days ago, then noted vaginal burning.  Tried cranberry juice and topical baking soda after cleaning up No sick contacts, recent antibiotics, or recent travels.   No vaginal discharge, back pain, fever  Past Medical History  Diagnosis Date  . History of hiatal hernia   . Mitral valve prolapse   . Arthritis   . Hyperlipemia   . Plantar fasciitis   . Hay fever   . GERD (gastroesophageal reflux disease)   . Palpitations   . Hypertension      Home Meds: Prior to Admission medications   Medication Sig Start Date End Date Taking? Authorizing Provider  Calcium Glycerophosphate (PRELIEF PO) Take 1 tablet by mouth daily as needed. For acidic foods   Yes Historical Provider, MD  Cranberry 140-100-3 MG-MG-UNIT CAPS Take 1 capsule by mouth 4 (four) times daily.   Yes Historical Provider, MD  furosemide (LASIX) 80 MG tablet Take 80 mg by mouth daily. 12/10/11 12/09/12 Yes Jacques Navy, MD  gabapentin (NEURONTIN) 100 MG capsule Take 100 mg by mouth 3 (three) times daily. Take one capsule at bedtime. For continued pain increase to three times a day.  11/18/11 11/17/12 Yes Jacques Navy, MD  losartan (COZAAR) 50 MG tablet Take 50 mg by mouth daily.   Yes Historical Provider, MD  metoprolol succinate (TOPROL-XL) 100 MG 24 hr tablet TAKE 1 TABLET EVERY DAY 02/11/12  Yes Jacques Navy, MD  mometasone (NASONEX) 50 MCG/ACT nasal spray Place 2 sprays into the nose daily. 03/20/11 05/18/12 Yes Jacques Navy, MD  omeprazole (PRILOSEC) 20 MG  capsule Take 20 mg by mouth daily.     Yes Historical Provider, MD  potassium chloride SA (K-DUR,KLOR-CON) 20 MEQ tablet Take 40 mEq by mouth 2 (two) times daily.   Yes Historical Provider, MD    Allergies:  Allergies  Allergen Reactions  . Aleve (Naproxen Sodium) Swelling    Of feet    History   Social History  . Marital Status: Widowed    Spouse Name: N/A    Number of Children: N/A  . Years of Education: N/A   Occupational History  . Not on file.   Social History Main Topics  . Smoking status: Never Smoker   . Smokeless tobacco: Never Used  . Alcohol Use: No  . Drug Use: Not on file  . Sexually Active: Not on file   Other Topics Concern  . Not on file   Social History Narrative   Married 47 years - widowed [phone] son, 1 daughter 2 grandsonsLives in her own home, son lives with her. Tries to walk every day. Starting to get out more after ortho injury. I- ADLS.End-of-life issues. DNR and no heroic or extraordinary measures.     Review of Systems: Constitutional: negative for chills, fever, night sweats or weight changes Cardiovascular: negative for chest pain or palpitations Respiratory: negative for hemoptysis, wheezing, or shortness of breath Abdominal: negative for abdominal pain, nausea, vomiting or diarrhea Dermatological: negative for rash Neurologic: negative  for headache   Physical Exam: Blood pressure 107/67, pulse 93, temperature 99 F (37.2 C), resp. rate 16, height 5\' 7"  (1.702 m), weight 171 lb (77.565 kg), SpO2 98.00%., Body mass index is 26.78 kg/(m^2). General: Well developed, well nourished, in no acute distress. Head: Normocephalic, atraumatic, eyes without discharge, sclera non-icteric, nares are congested. Bilateral auditory canals clear, TM's are without perforation, pearly grey with reflective cone of light bilaterally. Serous effusion bilaterally behind TM's. Maxillary sinus TTP. Oral cavity moist, dentition normal. Posterior pharynx with post  nasal drip and mild erythema. No peritonsillar abscess or tonsillar exudate. Neck: Supple. No thyromegaly. Full ROM. No lymphadenopathy. Lungs: Coarse breath sounds bilaterally without Clear bilaterally to auscultation without wheezes, rales, or rhonchi. Breathing is unlabored.  Heart: RRR with S1 S2. No murmurs, rubs, or gallops appreciated. Abdomen: Soft, non-tender, non-distended with normoactive bowel sounds. No hepatosplenomegaly. No rebound/guarding. No obvious abdominal masses. McBurney's, Rovsing's, Iliopsoas, and table jar all negative. Msk:  Strength and tone normal for age. Extremities: No clubbing or cyanosis. No edema. Neuro: Alert and oriented X 3. Moves all extremities spontaneously. CNII-XII grossly in tact. Psych:  Responds to questions appropriately with a normal affect.   Results for orders placed in visit on 04/18/12  POCT URINALYSIS DIPSTICK      Component Value Range   Color, UA yellow     Clarity, UA hazy     Glucose, UA neg     Bilirubin, UA neg     Ketones, UA neg     Spec Grav, UA 1.015     Blood, UA small     pH, UA 7.5     Protein, UA 100     Urobilinogen, UA 0.2     Nitrite, UA neg     Leukocytes, UA moderate (2+)    POCT UA - MICROSCOPIC ONLY      Component Value Range   WBC, Ur, HPF, POC TNTC     RBC, urine, microscopic 3-6     Bacteria, U Microscopic small     Mucus, UA neg     Epithelial cells, urine per micros 2-4     Crystals, Ur, HPF, POC neg     Casts, Ur, LPF, POC neg     Yeast, UA neg       ASSESSMENT AND PLAN:  79 y.o. year old female with UTI 1. Urinary frequency  POCT urinalysis dipstick, POCT UA - Microscopic Only, ciprofloxacin (CIPRO) 500 MG tablet  2. UTI (lower urinary tract infection)  ciprofloxacin (CIPRO) 500 MG tablet, Urine culture    - -Mucinex -Tylenol/Motrin prn -Rest/fluids -RTC precautions -RTC 3-5 days if no improvement  Signed, Elvina Sidle, MD 04/18/2012 8:42 AM

## 2012-04-21 LAB — URINE CULTURE: Colony Count: 70000

## 2012-05-06 ENCOUNTER — Encounter: Payer: Self-pay | Admitting: Internal Medicine

## 2012-05-06 ENCOUNTER — Ambulatory Visit (INDEPENDENT_AMBULATORY_CARE_PROVIDER_SITE_OTHER): Payer: Medicare Other | Admitting: Internal Medicine

## 2012-05-06 ENCOUNTER — Other Ambulatory Visit (INDEPENDENT_AMBULATORY_CARE_PROVIDER_SITE_OTHER): Payer: Medicare Other

## 2012-05-06 VITALS — BP 138/84 | HR 90 | Temp 98.7°F | Resp 12 | Wt 171.0 lb

## 2012-05-06 DIAGNOSIS — E785 Hyperlipidemia, unspecified: Secondary | ICD-10-CM

## 2012-05-06 DIAGNOSIS — I10 Essential (primary) hypertension: Secondary | ICD-10-CM

## 2012-05-06 DIAGNOSIS — N390 Urinary tract infection, site not specified: Secondary | ICD-10-CM

## 2012-05-06 LAB — LIPID PANEL
Cholesterol: 276 mg/dL — ABNORMAL HIGH (ref 0–200)
Triglycerides: 156 mg/dL — ABNORMAL HIGH (ref 0.0–149.0)

## 2012-05-06 LAB — POCT URINALYSIS DIPSTICK
Bilirubin, UA: NEGATIVE
Glucose, UA: NEGATIVE
Leukocytes, UA: NEGATIVE
Nitrite, UA: NEGATIVE
Urobilinogen, UA: 0.2
pH, UA: 6.5

## 2012-05-06 LAB — COMPREHENSIVE METABOLIC PANEL
CO2: 30 mEq/L (ref 19–32)
Creatinine, Ser: 0.9 mg/dL (ref 0.4–1.2)
GFR: 60.39 mL/min (ref >60.00)
Glucose, Bld: 107 mg/dL — ABNORMAL HIGH (ref 70–99)
Total Bilirubin: 0.8 mg/dL (ref 0.3–1.2)
Total Protein: 7.3 g/dL (ref 6.0–8.3)

## 2012-05-06 MED ORDER — FUROSEMIDE 40 MG PO TABS: 40.0000 mg | ORAL_TABLET | Freq: Every day | ORAL | Status: DC

## 2012-05-06 NOTE — Progress Notes (Signed)
Subjective:    Patient ID: [name], female    DOB: Jul 10, 1915, 79 y.o.   MRN: [id number]  HPI Mrs. Bibbins presents for follow up. She has been started on lasix for BP control. She feels that she is having undue urinary frequency along with incontinence. Frequency is about 4 times a day. Nocturia x 2.  She has had BP problems, had hyponatremia in the past. Reveiwed last echo - EF 60%  Had stool incontinence and possible UTI from contamination. Was seen at Urgent care - Jan 19th record reviewed. U/A w/ WBC TNTC. Treated with cipro 500 mg bid. Symptoms have resolved.  Past Medical History  Diagnosis Date  . History of hiatal hernia   . Mitral valve prolapse   . Arthritis   . Hyperlipemia   . Plantar fasciitis   . Hay fever   . GERD (gastroesophageal reflux disease)   . Palpitations   . Hypertension    Past Surgical History  Procedure Date  . Baldder suspension   . Foot surgery   . Tonsillectomy   . Abdominal hysterectomy    History reviewed. No pertinent family history. History   Social History  . Marital Status: Widowed    Spouse Name: N/A    Number of Children: N/A  . Years of Education: N/A   Occupational History  . Not on file.   Social History Main Topics  . Smoking status: Never Smoker   . Smokeless tobacco: Never Used  . Alcohol Use: No  . Drug Use: Not on file  . Sexually Active: Not on file   Other Topics Concern  . Not on file   Social History Narrative   Married 47 years - widowed [phone] son, 1 daughter 2 grandsonsLives in her own home, son lives with her. Tries to walk every day. Starting to get out more after ortho injury. I- ADLS.End-of-life issues. DNR and no heroic or extraordinary measures.    Current Outpatient Prescriptions on File Prior to Visit  Medication Sig Dispense Refill  . Calcium Glycerophosphate (PRELIEF PO) Take 1 tablet by mouth daily as needed. For acidic foods      . Cranberry 140-100-3 MG-MG-UNIT CAPS Take 1 capsule  by mouth 4 (four) times daily.      . furosemide (LASIX) 80 MG tablet Take 80 mg by mouth daily.      Marland Kitchen gabapentin (NEURONTIN) 100 MG capsule Take 100 mg by mouth 3 (three) times daily. Take one capsule at bedtime. For continued pain increase to three times a day.       . losartan (COZAAR) 50 MG tablet Take 50 mg by mouth daily.      . metoprolol succinate (TOPROL-XL) 100 MG 24 hr tablet TAKE 1 TABLET EVERY DAY  30 tablet  5  . mometasone (NASONEX) 50 MCG/ACT nasal spray Place 2 sprays into the nose daily.  17 g  12  . omeprazole (PRILOSEC) 20 MG capsule Take 20 mg by mouth daily.        . potassium chloride SA (K-DUR,KLOR-CON) 20 MEQ tablet Take 40 mEq by mouth 2 (two) times daily.      . ciprofloxacin (CIPRO) 500 MG tablet Take 1 tablet (500 mg total) by mouth 2 (two) times daily.  10 tablet  0       Review of Systems System review is negative for any constitutional, cardiac, pulmonary, GI or neuro symptoms or complaints other than as described in the HPI.     Objective:  Physical Exam Filed Vitals:   05/06/12 1308  BP: 138/84  Pulse: 90  Temp: 98.7 F (37.1 C)  Resp: 12   Gen'l - WNWD elderly white woman looking younger than age. HEENT- normal Cor- RRR PUlm - lungs CTAP Neuro - HOH, otherwise normal       Assessment & Plan:  UTI -  Recent uti diagnosed at Urgent care - record reviewed. She completed course of antibiotics. No symptoms at this time  Follow up dip U/A - negative. Problem resolved.

## 2012-05-06 NOTE — Patient Instructions (Addendum)
1. Blood pressure - good control on present medication. Lungs are clear Plan  Continue present medication but reduce lasix to 40 mg once a day  2. Urinary frequency - may be the lasix but could also be overactive bladder.  Plan If the urinary frequency isn't better on less lasix we can try medicine for overactive bladder  3. Low sodium - problem in the past. Plan Follow up lab today  4. Urinary track infection - test today is normal

## 2012-05-08 NOTE — Assessment & Plan Note (Signed)
[name] c/o excessive micturition accompanied by urgency with occasional incontinence. Her BP has been well controlled. Chart reviewed: prior 2 D echo with normal ejection fraction; hyponatremia resolved.  Plan Continue current medications but reduce furosemide to 40 mg daily.  Follow up BP at home - call for SBP consistently greater than 150 (new ACA guidelines liberalizing control for the elderly)

## 2012-05-09 ENCOUNTER — Encounter: Payer: Self-pay | Admitting: Internal Medicine

## 2012-05-12 ENCOUNTER — Telehealth: Payer: Self-pay | Admitting: Internal Medicine

## 2012-05-12 NOTE — Telephone Encounter (Signed)
Patient Information:  Caller Name: [name]  Phone: [phone]  Patient: [name], Hinson  Gender: Female  DOB: 02-Nov-1915  Age: 79 Years  PCP: Illene Regulus (Adults only)  Office Follow Up:  Does the office need to follow up with this patient?: Yes  Instructions For The Office: Patient given Diflucan 150mg  x1 po.  Due to weather.  patient will need appt to be seen if no better  RN Note:  [facility] [phone] request for medication   Diflucan 150mg  x 1 po  Symptoms  Reason For Call & Symptoms: Patient states she has a yeast infection.  Onset 05/09/12.   Treatment at home Clotromazole Ointment OTC cream once a night .   She states she used as directed and felt she was getting better but now having some vaginal burning . No discharge. No itching /+burning.  Recently completed Cipro for UTI  Reviewed Health History In EMR: Yes  Reviewed Medications In EMR: Yes  Reviewed Allergies In EMR: Yes  Reviewed Surgeries / Procedures: No  Date of Onset of Symptoms: 05/09/2012  Treatments Tried: OTC Vaginal cream/ Clotrimazole  Treatments Tried Worked: No  Guideline(s) Used:  Vaginal Discharge  Disposition Per Guideline:   See Within 3 Days in Office  Reason For Disposition Reached:   Symptoms of a yeast infection" (i.e., itchy, white discharge, not bad smelling) and not improved > 3 days following Care Advice  Advice Given:  Genital Hygiene:   Keep your genital area clean. Wash daily.  Keep your genital area dry. Wear cotton underwear or underwear with a cotton crotch.  Do not use feminine hygiene products.  Call Back If:  There is no improvement after treating yourself for a vaginal yeast infection  You become worse.  Verifed allerigies, medication and history per EPIc

## 2012-05-17 ENCOUNTER — Encounter: Payer: Self-pay | Admitting: Internal Medicine

## 2012-05-17 ENCOUNTER — Ambulatory Visit (INDEPENDENT_AMBULATORY_CARE_PROVIDER_SITE_OTHER): Payer: Medicare Other | Admitting: Internal Medicine

## 2012-05-17 ENCOUNTER — Telehealth: Payer: Self-pay | Admitting: Internal Medicine

## 2012-05-17 VITALS — BP 120/78 | HR 83 | Temp 98.1°F | Resp 10 | Wt 170.0 lb

## 2012-05-17 DIAGNOSIS — N760 Acute vaginitis: Secondary | ICD-10-CM

## 2012-05-17 MED ORDER — FLUCONAZOLE 100 MG PO TABS: 100.0000 mg | ORAL_TABLET | Freq: Every day | ORAL | Status: DC

## 2012-05-17 NOTE — Patient Instructions (Addendum)
1. Post-antibiotic yeast infection with burning and itching. You probable were not treated long enough.  Plan -  Diflucan (fluconazole) 100 mg once a day for 10-days.   2. Urinary incontinence - may be a question of over active bladder.  Plan Trial of Myrbetriq - a medication with very few side effects ( aside from wallet shock). This will help make a diagnosis if it helps  If myrbetriq does not help you may have other bladder function problems for which you may need to see a urologist.

## 2012-05-17 NOTE — Telephone Encounter (Signed)
Patient Information:  Caller Name: Teran  Phone: [phone]  Patient: [name], [name]  Gender: Female  DOB: July 08, 1915  Age: 79 Years  PCP: Illene Regulus (Adults only)  Office Follow Up:  Does the office need to follow up with this patient?: No  Instructions For The Office: N/A   Symptoms  Reason For Call & Symptoms: Pt is calling back. She was treated via phone last week for vaginal burning with Diflucan since the office was closed d/t bad weather. Pts sx are unresolved at this time. Pt is hard of hearing/phone triage very challenging.  Reviewed Health History In EMR: Yes  Reviewed Medications In EMR: Yes  Reviewed Allergies In EMR: Yes  Reviewed Surgeries / Procedures: Yes  Date of Onset of Symptoms: 05/12/2012  Treatments Tried: Diflucan 150mg  x 1  Treatments Tried Worked: No  Guideline(s) Used:  Vaginal Discharge  Disposition Per Guideline:   See Within 3 Days in Office  Reason For Disposition Reached:   Symptoms of a yeast infection" (i.e., itchy, white discharge, not bad smelling) and not improved > 3 days following Care Advice  Advice Given:  N/A  Appointment Scheduled:  05/17/2012 11:30:00 Appointment Scheduled Provider:  Illene Regulus (Adults only)

## 2012-05-18 NOTE — Progress Notes (Signed)
  Subjective:    Patient ID: [name], female    DOB: 04-08-1915, 79 y.o.   MRN: [id number]  HPI Mrs. Hittle was recently seen and diagnosed with UTI treated with antibiotics. Subsequently she called [facility] with c/o vaginal itching and discomfort. She was treated by [facility] with Diflucan Stat. Her symptoms initially improved but have recurred. She denies any discharge, dysuria, fever or abdominal pain.  PMH, FamHx and SocHx reviewed for any changes and relevance.  Current Outpatient Prescriptions on File Prior to Visit  Medication Sig Dispense Refill  . Calcium Glycerophosphate (PRELIEF PO) Take 1 tablet by mouth daily as needed. For acidic foods      . Cranberry 140-100-3 MG-MG-UNIT CAPS Take 1 capsule by mouth 4 (four) times daily.      . furosemide (LASIX) 40 MG tablet Take 1 tablet (40 mg total) by mouth daily.  30 tablet  11  . gabapentin (NEURONTIN) 100 MG capsule Take 100 mg by mouth 3 (three) times daily. Take one capsule at bedtime. For continued pain increase to three times a day.       . losartan (COZAAR) 50 MG tablet Take 50 mg by mouth daily.      . metoprolol succinate (TOPROL-XL) 100 MG 24 hr tablet TAKE 1 TABLET EVERY DAY  30 tablet  5  . mometasone (NASONEX) 50 MCG/ACT nasal spray Place 2 sprays into the nose daily.  17 g  12  . omeprazole (PRILOSEC) 20 MG capsule Take 20 mg by mouth daily.        . potassium chloride SA (K-DUR,KLOR-CON) 20 MEQ tablet Take 40 mEq by mouth 2 (two) times daily.       No current facility-administered medications on file prior to visit.      Review of Systems System review is negative for any constitutional, cardiac, pulmonary, GI or neuro symptoms or complaints other than as described in the HPI.    Objective:   Physical Exam Filed Vitals:   05/17/12 1129  BP: 120/78  Pulse: 83  Temp: 98.1 F (36.7 C)  Resp: 10   Gen'l- very elderly white woman in no distress Cor- RRR Pulm - normal respirations Neuro - very HOH otherwise  normal       Assessment & Plan:  Vaginitis - post antibiotic  Plan - diflucan 100 mg qD x 10 days

## 2012-05-21 ENCOUNTER — Other Ambulatory Visit: Payer: Self-pay | Admitting: Internal Medicine

## 2012-05-21 ENCOUNTER — Ambulatory Visit (INDEPENDENT_AMBULATORY_CARE_PROVIDER_SITE_OTHER): Payer: Medicare Other | Admitting: Family Medicine

## 2012-05-21 VITALS — BP 138/80 | HR 97 | Temp 97.9°F | Resp 18 | Ht 65.5 in | Wt 170.4 lb

## 2012-05-21 DIAGNOSIS — M1990 Unspecified osteoarthritis, unspecified site: Secondary | ICD-10-CM

## 2012-05-21 DIAGNOSIS — M25519 Pain in unspecified shoulder: Secondary | ICD-10-CM

## 2012-05-21 DIAGNOSIS — M129 Arthropathy, unspecified: Secondary | ICD-10-CM

## 2012-05-21 DIAGNOSIS — M542 Cervicalgia: Secondary | ICD-10-CM

## 2012-05-21 MED ORDER — PREDNISONE 10 MG PO TABS: ORAL_TABLET | ORAL | Status: DC

## 2012-05-21 NOTE — Patient Instructions (Addendum)
Take the prednisone as directed  Take Tylenol for pain. However if it is not doing the job you may can take tramadol for severe pain.  Try not to lay in bed watching television too much.  Continue your yeast medication as directed by your primary care physician.  Return if worse.

## 2012-05-21 NOTE — Progress Notes (Signed)
Subjective: 79 year old lady who is here because of her bad neck. For the last few days has been hurting her a lot. She has had problems with this periodically, but the last time was clear for years ago. She says it always takes having a course of prednisone two help improve the neck. Apparently her other concern was that she had recently had a yeast infection. She took one Diflucan which did not do the job. Her primary care gave her a ten-day course which she is on now. That causes some nausea. She gets up and around a lot, walks with a cane, but has been lying in her bed watching television a lot. She wonders whether that is causing some of her neck pain and I agreed with her that it probably is.  Objective: Fully alert and oriented but deaf lady in no major distress. Neck has very decreased range of motion. There is essentially no side to side tilt. Flexion extension and rotation are very limited. Motor is normal in the upper extremities. She has some tenderness in the anterior aspect of the right shoulder. She does have a history of fracturing her right shoulder a few years back.  Assessment: Cervical arthritis and pain and shoulder pain Deafness  Plan: Prednisone Tramadol  Return if worse.

## 2012-05-22 ENCOUNTER — Encounter (HOSPITAL_COMMUNITY): Payer: Self-pay | Admitting: Emergency Medicine

## 2012-05-22 ENCOUNTER — Other Ambulatory Visit: Payer: Self-pay | Admitting: Internal Medicine

## 2012-05-22 ENCOUNTER — Emergency Department (HOSPITAL_COMMUNITY)
Admission: EM | Admit: 2012-05-22 | Discharge: 2012-05-22 | Disposition: A | Discharge status: Home / Self Care | Payer: Medicare Other | Attending: Emergency Medicine | Admitting: Emergency Medicine

## 2012-05-22 DIAGNOSIS — Z8679 Personal history of other diseases of the circulatory system: Secondary | ICD-10-CM | POA: Insufficient documentation

## 2012-05-22 DIAGNOSIS — R3915 Urgency of urination: Secondary | ICD-10-CM | POA: Insufficient documentation

## 2012-05-22 DIAGNOSIS — K219 Gastro-esophageal reflux disease without esophagitis: Secondary | ICD-10-CM | POA: Insufficient documentation

## 2012-05-22 DIAGNOSIS — I10 Essential (primary) hypertension: Secondary | ICD-10-CM | POA: Insufficient documentation

## 2012-05-22 DIAGNOSIS — M129 Arthropathy, unspecified: Secondary | ICD-10-CM | POA: Insufficient documentation

## 2012-05-22 DIAGNOSIS — Z8739 Personal history of other diseases of the musculoskeletal system and connective tissue: Secondary | ICD-10-CM | POA: Insufficient documentation

## 2012-05-22 DIAGNOSIS — Z8639 Personal history of other endocrine, nutritional and metabolic disease: Secondary | ICD-10-CM | POA: Insufficient documentation

## 2012-05-22 DIAGNOSIS — Z862 Personal history of diseases of the blood and blood-forming organs and certain disorders involving the immune mechanism: Secondary | ICD-10-CM | POA: Insufficient documentation

## 2012-05-22 DIAGNOSIS — R350 Frequency of micturition: Secondary | ICD-10-CM | POA: Insufficient documentation

## 2012-05-22 DIAGNOSIS — H9190 Unspecified hearing loss, unspecified ear: Secondary | ICD-10-CM | POA: Insufficient documentation

## 2012-05-22 DIAGNOSIS — N952 Postmenopausal atrophic vaginitis: Secondary | ICD-10-CM | POA: Insufficient documentation

## 2012-05-22 DIAGNOSIS — Z8719 Personal history of other diseases of the digestive system: Secondary | ICD-10-CM | POA: Insufficient documentation

## 2012-05-22 DIAGNOSIS — Z8709 Personal history of other diseases of the respiratory system: Secondary | ICD-10-CM | POA: Insufficient documentation

## 2012-05-22 DIAGNOSIS — Z79899 Other long term (current) drug therapy: Secondary | ICD-10-CM | POA: Insufficient documentation

## 2012-05-22 DIAGNOSIS — Z792 Long term (current) use of antibiotics: Secondary | ICD-10-CM | POA: Insufficient documentation

## 2012-05-22 LAB — URINALYSIS, ROUTINE W REFLEX MICROSCOPIC
Bilirubin Urine: NEGATIVE
Hgb urine dipstick: NEGATIVE
Ketones, ur: NEGATIVE mg/dL
Protein, ur: NEGATIVE mg/dL
Urobilinogen, UA: 0.2 mg/dL (ref 0.0–1.0)

## 2012-05-22 LAB — POCT I-STAT, CHEM 8
BUN: 31 mg/dL — ABNORMAL HIGH (ref 6–23)
Chloride: 103 mEq/L (ref 96–112)
Creatinine, Ser: 1 mg/dL (ref 0.50–1.10)
Potassium: 4 mEq/L (ref 3.5–5.1)
Sodium: 137 mEq/L (ref 135–145)

## 2012-05-22 LAB — URINE MICROSCOPIC-ADD ON

## 2012-05-22 NOTE — ED Notes (Signed)
PT. REPORTS DYSURIA WITH VAGINAL ITCHING ONSET LAST WEEK , STATES HISTORY OF YEAST INFECTION .

## 2012-05-22 NOTE — ED Provider Notes (Signed)
Medical screening examination/treatment/procedure(s) were performed by non-physician practitioner and as supervising physician I was immediately available for consultation/collaboration.  Sunnie Nielsen, MD 05/22/12 [id number]

## 2012-05-22 NOTE — ED Provider Notes (Signed)
History     CSN: [id number]  Arrival date & time 05/22/12  0002   First MD Initiated Contact with Patient 05/22/12 0139      Chief Complaint  Patient presents with  . Dysuria    (Consider location/radiation/quality/duration/timing/severity/associated sxs/prior treatment) HPI Comments: 78 yo female presents with dysuria x 5 days. The patient has difficulty hearing, but is able to give a history. She describes a burning pain with urination and increased urgency. She has been followed with her PCP and was prescribed an antibiotic for presumed cystitis of which this is the fifth day of treatment. She says the burning is worsening and also describes vaginal itching. She contacted her PCP again and was prescribed 2 pills presumably for a yeast infection. She does not know the names of the medications she has been prescribed nor the diagnoses. She denies any fever, chills, flank pain, abdominal pain, polyuria, vaginal discharge, nausea, and vomiting.   Patient is a 79 y.o. female presenting with dysuria. The history is provided by the patient.  Dysuria  Associated symptoms include frequency and urgency. Pertinent negatives include no chills and no flank pain.    Past Medical History  Diagnosis Date  . History of hiatal hernia   . Mitral valve prolapse   . Arthritis   . Hyperlipemia   . Plantar fasciitis   . Hay fever   . GERD (gastroesophageal reflux disease)   . Palpitations   . Hypertension     Past Surgical History  Procedure Laterality Date  . Baldder suspension    . Foot surgery    . Tonsillectomy    . Abdominal hysterectomy      No family history on file.  History  Substance Use Topics  . Smoking status: Never Smoker   . Smokeless tobacco: Never Used  . Alcohol Use: No    OB History   Grav Para Term Preterm Abortions TAB SAB Ect Mult Living                  Review of Systems  Constitutional: Negative for fever and chills.  Endocrine: Negative for polyuria.   Genitourinary: Positive for dysuria, urgency and frequency. Negative for flank pain, vaginal discharge and difficulty urinating.  All other systems reviewed and are negative.    Allergies  Aleve  Home Medications   Current Outpatient Rx  Name  Route  Sig  Dispense  Refill  . Calcium Glycerophosphate (PRELIEF PO)   Oral   Take 1 tablet by mouth daily as needed. For acidic foods         . Cranberry 140-100-3 MG-MG-UNIT CAPS   Oral   Take 1 capsule by mouth 4 (four) times daily.         . fluconazole (DIFLUCAN) 100 MG tablet   Oral   Take 1 tablet (100 mg total) by mouth daily.   10 tablet   0   . furosemide (LASIX) 40 MG tablet   Oral   Take 1 tablet (40 mg total) by mouth daily.   30 tablet   11   . gabapentin (NEURONTIN) 100 MG capsule   Oral   Take 100 mg by mouth 3 (three) times daily as needed. Take one capsule at bedtime. For continued pain increase to three times a day.         . losartan (COZAAR) 50 MG tablet   Oral   Take 50 mg by mouth daily.         Marland Kitchen  metoprolol succinate (TOPROL-XL) 100 MG 24 hr tablet      TAKE 1 TABLET EVERY DAY   30 tablet   5   . mometasone (NASONEX) 50 MCG/ACT nasal spray   Nasal   Place 2 sprays into the nose daily as needed (nasal congestion).         Marland Kitchen omeprazole (PRILOSEC) 20 MG capsule   Oral   Take 20 mg by mouth daily.           . potassium chloride SA (K-DUR,KLOR-CON) 20 MEQ tablet   Oral   Take 40 mEq by mouth 2 (two) times daily.         . predniSONE (DELTASONE) 10 MG tablet      Take 4 daily today, then 3 on Saturday, and 2 on Sunday, and 1 on Monday   10 tablet   0   . EXPIRED: mometasone (NASONEX) 50 MCG/ACT nasal spray   Nasal   Place 2 sprays into the nose daily.   17 g   12     BP 139/80  Pulse 103  Temp(Src) 98.1 F (36.7 C) (Oral)  Resp 16  SpO2 96%  Physical Exam  Nursing note and vitals reviewed. Constitutional: She is oriented to person, place, and time. She appears  well-developed and well-nourished. No distress.  HENT:  Head: Normocephalic and atraumatic.  Eyes: Conjunctivae and EOM are normal.  Neck: Normal range of motion.  Pulmonary/Chest: Effort normal.  Abdominal: Soft. Bowel sounds are normal. She exhibits no distension and no mass. There is no tenderness. There is no rebound and no guarding.  Genitourinary: No vaginal discharge found.  Erythematous vulvar region, decreased vaginal discharge, labia tenderness to palpation.     Musculoskeletal: Normal range of motion.  Neurological: She is alert and oriented to person, place, and time.  Skin: Skin is warm and dry. No rash noted. She is not diaphoretic.  Psychiatric: She has a normal mood and affect. Her behavior is normal.    ED Course  Procedures (including critical care time)  Labs Reviewed  URINALYSIS, ROUTINE W REFLEX MICROSCOPIC - Abnormal; Notable for the following:    APPearance CLOUDY (*)    Leukocytes, UA SMALL (*)    All other components within normal limits  URINE MICROSCOPIC-ADD ON - Abnormal; Notable for the following:    Squamous Epithelial / LPF FEW (*)    All other components within normal limits  POCT I-STAT, CHEM 8 - Abnormal; Notable for the following:    BUN 31 (*)    Glucose, Bld 188 (*)    Calcium, Ion 1.12 (*)    All other components within normal limits   No results found.   No diagnosis found.    MDM  Pelvic exam consistent w atrophic vaginitis.  Pt counseled on conservative home care vs estrogen therapies. Advised to discuss in more depth with her PCP. UA does not appear infectious, although pt has been taking abx. Advised to continue taking medications as prescribed by PCP. Strict return precautions discussed         Jaci Carrel, PA-C 05/22/12 1610

## 2012-05-25 ENCOUNTER — Telehealth: Payer: Self-pay | Admitting: Internal Medicine

## 2012-05-25 NOTE — Telephone Encounter (Signed)
Patient Information:  Caller Name: [name]  Phone: [phone]  Patient: [name], [name]  Gender: Female  DOB: 29-Apr-1915  Age: 79 Years  PCP: [name] (Adults only)  Office Follow Up:  Does the office need to follow up with this patient?: No  Instructions For The Office: N/A  RN Note:  Seen in ED 05/22/12 for presumed vaginal yeast infection.  States ruled out infection, but told had vaginal dryness and to follow up with PCP 05/25/12.  Using KY jelly with little relief.  Per vulvar symptoms protocol, emergent symptoms denied; advised appt within 24 hours.  Appt scheduled first available with Dr. Debby Bud 05/26/12 1130.  krs/can  Symptoms  Reason For Call & Symptoms: vaginal dryness; calling for follow up appt  Reviewed Health History In EMR: Yes  Reviewed Medications In EMR: Yes  Reviewed Allergies In EMR: Yes  Reviewed Surgeries / Procedures: Yes  Date of Onset of Symptoms: 05/04/2012  Guideline(s) Used:  Itching - Localized  Vulvar Symptoms  Disposition Per Guideline:   See Today or Tomorrow in Office  Reason For Disposition Reached:   Moderate-Severe itching (i.e., interferes with school, work, or sleep)  Advice Given:  N/A  Appointment Scheduled:  05/26/2012 11:30:00 Appointment Scheduled Provider:  Illene [name] (Adults only)

## 2012-05-26 ENCOUNTER — Encounter: Payer: Self-pay | Admitting: Internal Medicine

## 2012-05-26 ENCOUNTER — Ambulatory Visit (INDEPENDENT_AMBULATORY_CARE_PROVIDER_SITE_OTHER): Payer: Medicare Other | Admitting: Internal Medicine

## 2012-05-26 VITALS — BP 118/70 | HR 82 | Temp 97.7°F | Resp 12 | Wt 170.0 lb

## 2012-05-26 DIAGNOSIS — N952 Postmenopausal atrophic vaginitis: Secondary | ICD-10-CM

## 2012-05-26 MED ORDER — ESTROGENS, CONJUGATED 0.625 MG/GM VA CREA: TOPICAL_CREAM | Freq: Every day | VAGINAL | Status: DC

## 2012-05-26 NOTE — Patient Instructions (Addendum)
1. Atrophic vaginitis - very common in older women. Plan - Premarin cream: 1 g (1 vaginal applicator) daily for 14 days and then 2 times a week. IT can take several weeks to see improvement.  2. Blood sugar: You were alarmed by a serum glucose of 188. Chart reviewed: A1C august '13   6%  Which is normal and is the gold standard for diagnosis. Serial serum glucose readings:  102   123   119   107   188. No evidence to suggest diabetes   Continue all your other medications.

## 2012-05-26 NOTE — Progress Notes (Signed)
  Subjective:    Patient ID: [name], female    DOB: March 17, 1916, 79 y.o.   MRN: [id number]  HPI [name] has been seen several times for dysuria. She did go to the ED - had a pelvic exam and was diagnosed with Atrophic vaginitis. She presents for Rx for estrogen cream.  She was alarmed by a serum glucose of 188. Chart reviewed: A1C august '13 6%. Serial serum glucose readings:  102   123   119   107   188. No evidence to suggest diabetes  PMH, FamHx and SocHx reviewed for any changes and relevance.  Current Outpatient Prescriptions on File Prior to Visit  Medication Sig Dispense Refill  . Calcium Glycerophosphate (PRELIEF PO) Take 1 tablet by mouth daily as needed. For acidic foods      . Cranberry 140-100-3 MG-MG-UNIT CAPS Take 1 capsule by mouth 4 (four) times daily.      . fluconazole (DIFLUCAN) 100 MG tablet Take 1 tablet (100 mg total) by mouth daily.  10 tablet  0  . furosemide (LASIX) 40 MG tablet Take 1 tablet (40 mg total) by mouth daily.  30 tablet  11  . gabapentin (NEURONTIN) 100 MG capsule Take 100 mg by mouth 3 (three) times daily as needed. Take one capsule at bedtime. For continued pain increase to three times a day.      . gabapentin (NEURONTIN) 100 MG capsule TAKE ONE CAPSULE BY MOUTH AT BEDTIME FOR CONTINUED PAIN INCREASE TO 1 CAP THREE TIMES DAILY  90 capsule  3  . losartan (COZAAR) 50 MG tablet Take 50 mg by mouth daily.      . metoprolol succinate (TOPROL-XL) 100 MG 24 hr tablet TAKE 1 TABLET EVERY DAY  30 tablet  5  . mometasone (NASONEX) 50 MCG/ACT nasal spray Place 2 sprays into the nose daily as needed (nasal congestion).      . NASONEX 50 MCG/ACT nasal spray PLACE 2 SPRAYS INTO THE NOSE DAILY.  17 g  3  . omeprazole (PRILOSEC) 20 MG capsule Take 20 mg by mouth daily.        . potassium chloride SA (K-DUR,KLOR-CON) 20 MEQ tablet Take 40 mEq by mouth 2 (two) times daily.      . predniSONE (DELTASONE) 10 MG tablet Take 4 daily today, then 3 on Saturday,  and 2 on Sunday, and 1 on Monday  10 tablet  0   No current facility-administered medications on file prior to visit.     Review of Systems System review is negative for any constitutional, cardiac, pulmonary, GI or neuro symptoms or complaints other than as described in the HPI.     Objective:   Physical Exam Filed Vitals:   05/26/12 1140  BP: 118/70  Pulse: 82  Temp: 97.7 F (36.5 C)  Resp: 12   Gen'l- eldery white woman in no distress Cor- RRR Pulm - normal Neuro - HOH, non-focal       Assessment & Plan:

## 2012-05-29 DIAGNOSIS — N9489 Other specified conditions associated with female genital organs and menstrual cycle: Secondary | ICD-10-CM | POA: Insufficient documentation

## 2012-05-29 NOTE — Assessment & Plan Note (Signed)
Symptomatic atrophic vaginitis.  Plan Premarin cream vaginal applicator twice a week after initial 14 days.

## 2012-06-24 ENCOUNTER — Telehealth: Payer: Self-pay | Admitting: Internal Medicine

## 2012-06-24 ENCOUNTER — Ambulatory Visit (INDEPENDENT_AMBULATORY_CARE_PROVIDER_SITE_OTHER): Payer: Medicare Other | Admitting: Internal Medicine

## 2012-06-24 ENCOUNTER — Encounter: Payer: Self-pay | Admitting: Internal Medicine

## 2012-06-24 VITALS — BP 148/86 | HR 76 | Temp 97.6°F | Resp 12 | Ht 67.0 in | Wt 170.0 lb

## 2012-06-24 DIAGNOSIS — M549 Dorsalgia, unspecified: Secondary | ICD-10-CM

## 2012-06-24 DIAGNOSIS — M542 Cervicalgia: Secondary | ICD-10-CM | POA: Insufficient documentation

## 2012-06-24 MED ORDER — IBUPROFEN 600 MG PO TABS: 600.0000 mg | ORAL_TABLET | Freq: Three times a day (TID) | ORAL | Status: DC | PRN

## 2012-06-24 NOTE — Telephone Encounter (Signed)
Patient Information:  Caller Name: Tisa  Phone: [phone]  Patient: [name], [name]  Gender: Female  DOB: [birth year]  Age: 79 Years  PCP: Illene Regulus (Adults only)  Office Follow Up:  Does the office need to follow up with this patient?: No  Instructions For The Office: N/A  RN Note:  Symptoms began after starting otc pain medication, Ibuprofen BID. Is taking with food.  No vomiting.  Advised to see MD per standing orders for caller requesting medication for nausea and appointments are available.  Symptoms  Reason For Call & Symptoms: Nausea; adking for medication.  Reports lots of back and neck pain related to arthritis relieved with 1 Motrin BID.  Reviewed Health History In EMR: Yes  Reviewed Medications In EMR: Yes  Reviewed Allergies In EMR: Yes  Reviewed Surgeries / Procedures: Yes  Date of Onset of Symptoms: 06/23/2012  Guideline(s) Used:  No Protocol Available - Sick Adult  Disposition Per Guideline:   See Today in Office  Reason For Disposition Reached:   Nursing judgment  Advice Given:  N/A  Patient Will Follow Care Advice:  YES  Appointment Scheduled:  06/24/2012 16:30:00 Appointment Scheduled Provider:  Illene Regulus (Adults only)

## 2012-06-24 NOTE — Telephone Encounter (Signed)
Addendum; triaged per Nausea/Vomiting guideline.  Advised to see MD for symptoms began after starting otc medication since office is open per MD standing orders for caller requesting Rx for nausea. Appointment scheduled for 1630 06/24/12 with Dr Debby Bud.

## 2012-06-24 NOTE — Progress Notes (Signed)
Subjective:    Patient ID: [name], female    DOB: [birth year], 79 y.o.   MRN: [id number]  HPI [name] complains of a flare of neck pain that is really bothersome. She has also complained of back pain. She denies any radiation to the legs, no incontinence. She does get some relief from ibuprofen. Denies any UE paresthesia.  She reports a history of neck pain in the past and had x-rays 10 years ago. She was treated before with prednisone that gave her some relief.  Past Medical History  Diagnosis Date  . History of hiatal hernia   . Mitral valve prolapse   . Arthritis   . Hyperlipemia   . Plantar fasciitis   . Hay fever   . GERD (gastroesophageal reflux disease)   . Palpitations   . Hypertension    Past Surgical History  Procedure Laterality Date  . Baldder suspension    . Foot surgery    . Tonsillectomy    . Abdominal hysterectomy     History reviewed. No pertinent family history. History   Social History  . Marital Status: Widowed    Spouse Name: N/A    Number of Children: N/A  . Years of Education: N/A   Occupational History  . Not on file.   Social History Main Topics  . Smoking status: Never Smoker   . Smokeless tobacco: Never Used  . Alcohol Use: No  . Drug Use: No  . Sexually Active: Not on file   Other Topics Concern  . Not on file   Social History Narrative   Married 47 years - widowed 1996   1 son, 1 daughter 2 grandsons   Lives in her own home, son lives with her. Tries to walk every day. Starting to get out more after ortho injury. I- ADLS.   End-of-life issues. DNR and no heroic or extraordinary measures.    Current Outpatient Prescriptions on File Prior to Visit  Medication Sig Dispense Refill  . Calcium Glycerophosphate (PRELIEF PO) Take 1 tablet by mouth daily as needed. For acidic foods      . conjugated estrogens (PREMARIN) vaginal cream Place vaginally daily.  42.5 g  12  . Cranberry 140-100-3 MG-MG-UNIT CAPS Take 1  capsule by mouth 4 (four) times daily.      . fluconazole (DIFLUCAN) 100 MG tablet Take 1 tablet (100 mg total) by mouth daily.  10 tablet  0  . furosemide (LASIX) 40 MG tablet Take 1 tablet (40 mg total) by mouth daily.  30 tablet  11  . gabapentin (NEURONTIN) 100 MG capsule Take 100 mg by mouth 3 (three) times daily as needed. Take one capsule at bedtime. For continued pain increase to three times a day.      . losartan (COZAAR) 50 MG tablet Take 50 mg by mouth daily.      . metoprolol succinate (TOPROL-XL) 100 MG 24 hr tablet TAKE 1 TABLET EVERY DAY  30 tablet  5  . mometasone (NASONEX) 50 MCG/ACT nasal spray Place 2 sprays into the nose daily as needed (nasal congestion).      . NASONEX 50 MCG/ACT nasal spray PLACE 2 SPRAYS INTO THE NOSE DAILY.  17 g  3  . omeprazole (PRILOSEC) 20 MG capsule Take 20 mg by mouth daily.        . potassium chloride SA (K-DUR,KLOR-CON) 20 MEQ tablet Take 40 mEq by mouth 2 (two) times daily.       No current  facility-administered medications on file prior to visit.     Review of Systems System review is negative for any constitutional, cardiac, pulmonary, GI or neuro symptoms or complaints other than as described in the HPI.      Objective:   Physical Exam Filed Vitals:   06/24/12 1651  BP: 148/86  Pulse: 76  Temp: 97.6 F (36.4 C)  Resp: 12   Gen'l - WNWD white woman , looks good for 89 HEENT- C&S clear Cor - 2+ radial pulse Pulm - normal respirations MSK - normal ROM neck. Normal shoulder shrug. Normal grip strength and UE strength. Normal DTRs Back - able to stand w/o assist. Able to flex. Able to ambulate.        Assessment & Plan:  1. Neck pain - on exam there is no evidence of a herniated disk or any nerve compression. There is good ROM and strength.  Plan  - return for repeat neck x-rays  Take ibuprofen 600 mg three times a day.  Range of motion exercise daily  Heat can help along with use of a rub, e.g. BenGay, or  Aspercreme  2.Back pain - does not seem to be too serious.  Plan Aleve will help  Heat will help  Regular stretching will help

## 2012-06-24 NOTE — Assessment & Plan Note (Signed)
Neck pain - on exam there is no evidence of a herniated disk or any nerve compression. There is good ROM and strength.  Plan  - return for repeat neck x-rays  Take Aleve (naproxen sodium) 2 tablets in AM and 2 tablets in PM  Range of motion exercise daily  Heat can help along with use of a rub, e.g. BenGay, or Aspercreme

## 2012-06-24 NOTE — Patient Instructions (Addendum)
1. Neck pain - on exam there is no evidence of a herniated disk or any nerve compression. There is good ROM and strength.  Plan  - return for repeat neck x-rays  Take ibuprofen 600 mg three times a day.  Range of motion exercise daily  Heat can help along with use of a rub, e.g. BenGay, or Aspercreme  2.Back pain - does not seem to be too serious.  Plan Aleve will help  Heat will help  Regular stretching will help

## 2012-06-28 ENCOUNTER — Ambulatory Visit (INDEPENDENT_AMBULATORY_CARE_PROVIDER_SITE_OTHER)
Admission: RE | Admit: 2012-06-28 | Discharge: 2012-06-28 | Disposition: A | Discharge status: Home / Self Care | Payer: Medicare Other | Source: Ambulatory Visit | Attending: Internal Medicine | Admitting: Internal Medicine

## 2012-06-28 DIAGNOSIS — M542 Cervicalgia: Secondary | ICD-10-CM

## 2012-07-08 ENCOUNTER — Other Ambulatory Visit: Payer: Self-pay | Admitting: Internal Medicine

## 2012-07-26 ENCOUNTER — Ambulatory Visit (INDEPENDENT_AMBULATORY_CARE_PROVIDER_SITE_OTHER): Payer: Medicare Other | Admitting: Sports Medicine

## 2012-07-26 ENCOUNTER — Encounter: Payer: Self-pay | Admitting: Sports Medicine

## 2012-07-26 VITALS — BP 164/69 | HR 78 | Ht 67.0 in | Wt 170.0 lb

## 2012-07-26 DIAGNOSIS — M1712 Unilateral primary osteoarthritis, left knee: Secondary | ICD-10-CM

## 2012-07-26 DIAGNOSIS — M25569 Pain in unspecified knee: Secondary | ICD-10-CM

## 2012-07-26 DIAGNOSIS — IMO0002 Reserved for concepts with insufficient information to code with codable children: Secondary | ICD-10-CM

## 2012-07-26 DIAGNOSIS — M1710 Unilateral primary osteoarthritis, unspecified knee: Secondary | ICD-10-CM

## 2012-07-26 DIAGNOSIS — M25562 Pain in left knee: Secondary | ICD-10-CM

## 2012-07-26 MED ORDER — METHYLPREDNISOLONE ACETATE 40 MG/ML IJ SUSP
40.0000 mg | Freq: Once | INTRAMUSCULAR | Status: AC
  Administered 2012-07-26: 40 mg via INTRA_ARTICULAR

## 2012-07-27 NOTE — Progress Notes (Signed)
  Subjective:    Patient ID: [name], female    DOB: 1915/08/18, 79 y.o.   MRN: [id number]  HPI Patient comes in today at the request of her primary care physician Dr. Arthur Holms. She has a history of left knee DJD in is received both cortisone and Visco supplementation injections in the past. She was complaining of some returning knee pain ring her last office visit with her PCP and he recommended that she return to the office today for an injection. Her pain is intermittent. For the most part it is tolerable. No recent trauma or injury. No significant swelling. Pain is most noticeable with prolonged standing and walking. Minimal pain at rest.  Interim medical history and current medications are reviewed    Review of Systems     Objective:   Physical Exam Well-developed, well-nourished. No acute distress. Vital signs are reviewed.  Left knee: Full range of motion. 1+ boggy synovitis. No effusion. Slight tenderness along the medial joint line but not markedly. Negative McMurray's. 1+ patellofemoral crepitus. Good joint stability. Walking with a minimal limp.  No new x-rays were obtained. Prior x-rays show mild tricompartmental DJD.       Assessment & Plan:  1. Left knee pain secondary to mild DJD  Patient symptoms are not severe enough at this point in time to warrant another round of Visco supplementation. Instead, we will inject her with a cortisone injection. She will followup when necessary.  Consent obtained and verified. Time-out conducted. Noted no overlying erythema, induration, or other signs of local infection. Skin prepped in a sterile fashion. Topical analgesic spray: Ethyl chloride. Joint: left knee, anterior lateral approach Needle: 25g 1 1/2 inch Completed without difficulty. Meds: 3cc 1% xylocaine, 1cc (40mg ) depo medrol Advised to call if fevers/chills, erythema, induration, drainage, or persistent bleeding.

## 2012-08-04 ENCOUNTER — Ambulatory Visit (INDEPENDENT_AMBULATORY_CARE_PROVIDER_SITE_OTHER): Payer: Medicare Other | Admitting: Podiatry

## 2012-08-04 ENCOUNTER — Encounter: Payer: Self-pay | Admitting: Podiatry

## 2012-08-04 DIAGNOSIS — B351 Tinea unguium: Secondary | ICD-10-CM

## 2012-08-04 DIAGNOSIS — M25579 Pain in unspecified ankle and joints of unspecified foot: Secondary | ICD-10-CM

## 2012-08-04 NOTE — Progress Notes (Signed)
Subjective: 79 y.o. year old female patient presents complaining of painful nails. Patient requests toe nails, corns and calluses trimmed. Stated that her feet feel numb. Patient is currently on Neurontin prescribed by PCP.   Patient Summary List & History reviewed for allergies, medications, medical problems and surgical history.  Review of Systems - General ROS: negative for - chills, fever, hot flashes, night sweats, sleep disturbance, weight gain or weight loss Ophthalmic ROS: negative ENT ROS: negative Allergy and Immunology ROS: negative Breast ROS: negative for breast lumps Respiratory ROS: no cough, shortness of breath, or wheezing Cardiovascular ROS: no chest pain or dyspnea on exertion Gastrointestinal ROS: no abdominal pain, change in bowel habits, or black or bloody stools Musculoskeletal ROS: Has arthritis in back, arms and shoulders. Neurological ROS: no TIA or stroke symptoms  Objective: Dermatologic:  Positive for porokeratotic lesion sub 5 right.  All nails are hypertrophic and elongated. Cryptotic nail both great toe medial border symptomatic.  Vascular: Dorsalis pedis arteries are not palpable on both. Posterior tibial pulses are palpable on both.  Orthopedic:  Rectus foot with mild digital contracture right foot.  Neurologic:  Decreased sensory perception to Monofilament sensory testing bilateral. Normal on Vibratory and ankle DTR bilateral.  Assessment: Dystrophic mycotic nails x 10. Ingrown nail both great toes. Porokeratosis sub 5 right. Peripheral neuropathy.   Treatment: All mycotic nails, corns, calluses debrided.  Return in 3 months or as needed.

## 2012-08-07 ENCOUNTER — Ambulatory Visit (INDEPENDENT_AMBULATORY_CARE_PROVIDER_SITE_OTHER): Payer: Medicare Other | Admitting: Family Medicine

## 2012-08-07 VITALS — BP 150/74 | HR 71 | Temp 97.9°F | Resp 18 | Wt 170.0 lb

## 2012-08-07 DIAGNOSIS — R300 Dysuria: Secondary | ICD-10-CM

## 2012-08-07 DIAGNOSIS — N390 Urinary tract infection, site not specified: Secondary | ICD-10-CM

## 2012-08-07 LAB — POCT URINALYSIS DIPSTICK
Bilirubin, UA: NEGATIVE
Glucose, UA: NEGATIVE
Ketones, UA: NEGATIVE
Nitrite, UA: NEGATIVE
Spec Grav, UA: 1.015
Urobilinogen, UA: 0.2
pH, UA: 7.5

## 2012-08-07 LAB — POCT UA - MICROSCOPIC ONLY
Casts, Ur, LPF, POC: NEGATIVE
Crystals, Ur, HPF, POC: NEGATIVE
Mucus, UA: POSITIVE
Yeast, UA: NEGATIVE

## 2012-08-07 MED ORDER — SULFAMETHOXAZOLE-TRIMETHOPRIM 800-160 MG PO TABS: 1.0000 | ORAL_TABLET | Freq: Two times a day (BID) | ORAL | Status: DC

## 2012-08-07 NOTE — Progress Notes (Signed)
Patient ID: [name] MRN: [id number], DOB: September 11, 1915, 79 y.o. Date of Encounter: 08/07/2012, 9:30 AM  Primary Physician: Illene Regulus, MD  Chief Complaint:  Chief Complaint  Patient presents with  . Urinary Tract Infection    HPI: 79 y.o. year old female presents with 1 day history of dysuria, urgency, and frequency. Last UTI was several months ago in January.  She is taking premarin to reduce the incidence of UTI No hematuria  No sick contacts, recent antibiotics, or recent travels.   No vaginal discharge, back pain, fever  Past Medical History  Diagnosis Date  . History of hiatal hernia   . Mitral valve prolapse   . Arthritis   . Hyperlipemia   . Plantar fasciitis   . Hay fever   . GERD (gastroesophageal reflux disease)   . Palpitations   . Hypertension   . Neuromuscular disorder      Home Meds: Prior to Admission medications   Medication Sig Start Date End Date Taking? Authorizing Provider  Calcium Glycerophosphate (PRELIEF PO) Take 1 tablet by mouth daily as needed. For acidic foods   Yes Historical Provider, MD  conjugated estrogens (PREMARIN) vaginal cream Place vaginally daily. 05/26/12  Yes Jacques Navy, MD  Cranberry 140-100-3 MG-MG-UNIT CAPS Take 1 capsule by mouth 4 (four) times daily.   Yes Historical Provider, MD  fluconazole (DIFLUCAN) 100 MG tablet Take 1 tablet (100 mg total) by mouth daily. 05/17/12  Yes Jacques Navy, MD  furosemide (LASIX) 40 MG tablet Take 1 tablet (40 mg total) by mouth daily. 05/06/12 05/06/13 Yes Jacques Navy, MD  gabapentin (NEURONTIN) 100 MG capsule Take 100 mg by mouth 3 (three) times daily as needed. Take one capsule at bedtime. For continued pain increase to three times a day. 11/18/11 11/17/12 Yes Jacques Navy, MD  ibuprofen (ADVIL,MOTRIN) 600 MG tablet Take 1 tablet (600 mg total) by mouth every 8 (eight) hours as needed for pain. 06/24/12  Yes Rosalyn Gess Norins, MD  KLOR-CON M20 20 MEQ tablet TAKE 2  TABLETS (40 MEQ TOTAL) BY MOUTH DAILY. 07/08/12  Yes Jacques Navy, MD  losartan (COZAAR) 50 MG tablet Take 50 mg by mouth daily.   Yes Historical Provider, MD  metoprolol succinate (TOPROL-XL) 100 MG 24 hr tablet TAKE 1 TABLET EVERY DAY 02/11/12  Yes Jacques Navy, MD  mometasone (NASONEX) 50 MCG/ACT nasal spray Place 2 sprays into the nose daily as needed (nasal congestion).   Yes Historical Provider, MD  NASONEX 50 MCG/ACT nasal spray PLACE 2 SPRAYS INTO THE NOSE DAILY. 05/21/12  Yes Jacques Navy, MD  omeprazole (PRILOSEC) 20 MG capsule Take 20 mg by mouth daily.     Yes Historical Provider, MD    Allergies:  Allergies  Allergen Reactions  . Aleve (Naproxen Sodium) Swelling    Legs and feet    History   Social History  . Marital Status: Widowed    Spouse Name: N/A    Number of Children: N/A  . Years of Education: N/A   Occupational History  . Not on file.   Social History Main Topics  . Smoking status: Never Smoker   . Smokeless tobacco: Never Used  . Alcohol Use: No  . Drug Use: No  . Sexually Active: Not on file   Other Topics Concern  . Not on file   Social History Narrative   Married 47 years - widowed 1996   1 son, 1 daughter 2 grandsons  Lives in her own home, son lives with her. Tries to walk every day. Starting to get out more after ortho injury. I- ADLS.   End-of-life issues. DNR and no heroic or extraordinary measures.     Review of Systems: Constitutional: negative for chills, fever, night sweats or weight changes Cardiovascular: negative for chest pain or palpitations Respiratory: negative for hemoptysis, wheezing, or shortness of breath Abdominal: negative for abdominal pain, nausea, vomiting or diarrhea Dermatological: negative for rash Neurologic: negative for headache   Physical Exam: Blood pressure 150/74, pulse 71, temperature 97.9 F (36.6 C), temperature source Oral, resp. rate 18, weight 170 lb (77.111 kg)., Body mass index is  26.62 kg/(m^2). General: Well developed, well nourished, in no acute distress. Head: Normocephalic, atraumatic, eyes without discharge, sclera non-icteric, nares are congested. Bilateral auditory canals clear, TM's are without perforation, pearly grey with reflective cone of light bilaterally. Serous effusion bilaterally behind TM's. Maxillary sinus TTP. Oral cavity moist, dentition normal. Posterior pharynx with post nasal drip and mild erythema. No peritonsillar abscess or tonsillar exudate. Neck: Supple. No thyromegaly. Full ROM. No lymphadenopathy. Lungs: Coarse breath sounds bilaterally without Clear bilaterally to auscultation without wheezes, rales, or rhonchi. Breathing is unlabored.  Heart: RRR with S1 S2. No murmurs, rubs, or gallops appreciated. Abdomen: Soft, non-tender, non-distended with normoactive bowel sounds. No hepatosplenomegaly. No rebound/guarding. No obvious abdominal masses. McBurney's, Rovsing's, Iliopsoas, and table jar all negative. Msk:  Strength and tone normal for age. Extremities: No clubbing or cyanosis. No edema. Neuro: Alert and oriented X 3. Moves all extremities spontaneously. CNII-XII grossly in tact. Psych:  Responds to questions appropriately with a normal affect.   Labs: Results for orders placed during the hospital encounter of 05/22/12  URINALYSIS, ROUTINE W REFLEX MICROSCOPIC      Result Value Range   Color, Urine YELLOW  YELLOW   APPearance CLOUDY (*) CLEAR   Specific Gravity, Urine 1.020  1.005 - 1.030   pH 6.0  5.0 - 8.0   Glucose, UA NEGATIVE  NEGATIVE mg/dL   Hgb urine dipstick NEGATIVE  NEGATIVE   Bilirubin Urine NEGATIVE  NEGATIVE   Ketones, ur NEGATIVE  NEGATIVE mg/dL   Protein, ur NEGATIVE  NEGATIVE mg/dL   Urobilinogen, UA 0.2  0.0 - 1.0 mg/dL   Nitrite NEGATIVE  NEGATIVE   Leukocytes, UA SMALL (*) NEGATIVE  URINE MICROSCOPIC-ADD ON      Result Value Range   Squamous Epithelial / LPF FEW (*) RARE   WBC, UA 0-2  <3 WBC/hpf   RBC /  HPF 0-2  <3 RBC/hpf   Bacteria, UA RARE  RARE  POCT I-STAT, CHEM 8      Result Value Range   Sodium 137  135 - 145 mEq/L   Potassium 4.0  3.5 - 5.1 mEq/L   Chloride 103  96 - 112 mEq/L   BUN 31 (*) 6 - 23 mg/dL   Creatinine, Ser 4.09  0.50 - 1.10 mg/dL   Glucose, Bld 811 (*) 70 - 99 mg/dL   Calcium, Ion 9.14 (*) 1.13 - 1.30 mmol/L   TCO2 26  0 - 100 mmol/L   Hemoglobin 12.9  12.0 - 15.0 g/dL   HCT 78.2  95.6 - 21.3 %      ASSESSMENT AND PLAN:  79 y.o. year old female with recurrent UTI Dysuria - Plan: POCT urinalysis dipstick, POCT UA - Microscopic Only, sulfamethoxazole-trimethoprim (BACTRIM DS,SEPTRA DS) 800-160 MG per tablet, Urine culture   - -Mucinex -Tylenol/Motrin prn -Rest/fluids -RTC  precautions -RTC 3-5 days if no improvement  Signed, Elvina Sidle, MD 08/07/2012 9:30 AM

## 2012-08-07 NOTE — Patient Instructions (Addendum)
Urinary Tract Infection Urinary tract infections (UTIs) can develop anywhere along your urinary tract. Your urinary tract is your body's drainage system for removing wastes and extra water. Your urinary tract includes two kidneys, two ureters, a bladder, and a urethra. Your kidneys are a pair of bean-shaped organs. Each kidney is about the size of your fist. They are located below your ribs, one on each side of your spine. CAUSES Infections are caused by microbes, which are microscopic organisms, including fungi, viruses, and bacteria. These organisms are so small that they can only be seen through a microscope. Bacteria are the microbes that most commonly cause UTIs. SYMPTOMS  Symptoms of UTIs may vary by age and gender of the patient and by the location of the infection. Symptoms in young women typically include a frequent and intense urge to urinate and a painful, burning feeling in the bladder or urethra during urination. Older women and men are more likely to be tired, shaky, and weak and have muscle aches and abdominal pain. A fever may mean the infection is in your kidneys. Other symptoms of a kidney infection include pain in your back or sides below the ribs, nausea, and vomiting. DIAGNOSIS To diagnose a UTI, your caregiver will ask you about your symptoms. Your caregiver also will ask to provide a urine sample. The urine sample will be tested for bacteria and white blood cells. White blood cells are made by your body to help fight infection. TREATMENT  Typically, UTIs can be treated with medication. Because most UTIs are caused by a bacterial infection, they usually can be treated with the use of antibiotics. The choice of antibiotic and length of treatment depend on your symptoms and the type of bacteria causing your infection. HOME CARE INSTRUCTIONS  If you were prescribed antibiotics, take them exactly as your caregiver instructs you. Finish the medication even if you feel better after you  have only taken some of the medication.  Drink enough water and fluids to keep your urine clear or pale yellow.  Avoid caffeine, tea, and carbonated beverages. They tend to irritate your bladder.  Empty your bladder often. Avoid holding urine for long periods of time.  Empty your bladder before and after sexual intercourse.  After a bowel movement, women should cleanse from front to back. Use each tissue only once. SEEK MEDICAL CARE IF:   You have back pain.  You develop a fever.  Your symptoms do not begin to resolve within 3 days. SEEK IMMEDIATE MEDICAL CARE IF:   You have severe back pain or lower abdominal pain.  You develop chills.  You have nausea or vomiting.  You have continued burning or discomfort with urination. MAKE SURE YOU:   Understand these instructions.  Will watch your condition.  Will get help right away if you are not doing well or get worse. Document Released: 12/25/2004 Document Revised: 09/16/2011 Document Reviewed: 04/25/2011 [employer] Patient Information 2013 [employer].  

## 2012-08-08 LAB — URINE CULTURE: Colony Count: 40000

## 2012-08-13 ENCOUNTER — Other Ambulatory Visit (INDEPENDENT_AMBULATORY_CARE_PROVIDER_SITE_OTHER): Payer: Medicare Other

## 2012-08-13 ENCOUNTER — Ambulatory Visit (INDEPENDENT_AMBULATORY_CARE_PROVIDER_SITE_OTHER): Payer: Medicare Other | Admitting: Internal Medicine

## 2012-08-13 ENCOUNTER — Encounter: Payer: Self-pay | Admitting: Internal Medicine

## 2012-08-13 VITALS — BP 150/96 | HR 77 | Temp 97.5°F | Wt 168.4 lb

## 2012-08-13 DIAGNOSIS — R109 Unspecified abdominal pain: Secondary | ICD-10-CM | POA: Insufficient documentation

## 2012-08-13 DIAGNOSIS — R350 Frequency of micturition: Secondary | ICD-10-CM | POA: Insufficient documentation

## 2012-08-13 DIAGNOSIS — I10 Essential (primary) hypertension: Secondary | ICD-10-CM

## 2012-08-13 DIAGNOSIS — N952 Postmenopausal atrophic vaginitis: Secondary | ICD-10-CM

## 2012-08-13 LAB — URINALYSIS, ROUTINE W REFLEX MICROSCOPIC
Bilirubin Urine: NEGATIVE
Leukocytes, UA: NEGATIVE
Nitrite: NEGATIVE
Specific Gravity, Urine: <=1.005 (ref 1.000–1.030)
Urobilinogen, UA: 0.2 (ref 0.0–1.0)
pH: 6 (ref 5.0–8.0)

## 2012-08-13 MED ORDER — CEPHALEXIN 500 MG PO CAPS: 500.0000 mg | ORAL_CAPSULE | Freq: Four times a day (QID) | ORAL | Status: DC

## 2012-08-13 NOTE — Patient Instructions (Signed)
OK to take a Different Antibiotic as it seems you may have the Bladder infection come back Your specimen will be sent to the Lab, but results wont be available until early next wk Please continue all other medications as before, and refills have been done if requested.

## 2012-08-13 NOTE — Progress Notes (Signed)
Subjective:    Patient ID: [name], female    DOB: Jun 29, 1915, 79 y.o.   MRN: [id number]  HPI    Here with burning with urination for 2 days and urinary freq, worried that her premarin vag cream is not worrking since she just finished tx for UTI recent with 5 days septra.  Has low mid abd pain, no fever, no n/v, flank pain, chills or hematuria.  Hx difficult, very severe HOH. No vag d/c or vag bleeding. Pt denies chest pain, increased sob or doe, wheezing, orthopnea, PND, increased LE swelling, palpitations, dizziness or syncope.  Past Medical History  Diagnosis Date  . History of hiatal hernia   . Mitral valve prolapse   . Arthritis   . Hyperlipemia   . Plantar fasciitis   . Hay fever   . GERD (gastroesophageal reflux disease)   . Palpitations   . Hypertension   . Neuromuscular disorder    Past Surgical History  Procedure Laterality Date  . Baldder suspension    . Foot surgery    . Tonsillectomy    . Abdominal hysterectomy      reports that she has never smoked. She has never used smokeless tobacco. She reports that she does not drink alcohol or use illicit drugs. family history is not on file. Allergies  Allergen Reactions  . Aleve (Naproxen Sodium) Swelling    Legs and feet   Current Outpatient Prescriptions on File Prior to Visit  Medication Sig Dispense Refill  . Calcium Glycerophosphate (PRELIEF PO) Take 1 tablet by mouth daily as needed. For acidic foods      . conjugated estrogens (PREMARIN) vaginal cream Place vaginally daily.  42.5 g  12  . Cranberry 140-100-3 MG-MG-UNIT CAPS Take 1 capsule by mouth 4 (four) times daily.      . fluconazole (DIFLUCAN) 100 MG tablet Take 1 tablet (100 mg total) by mouth daily.  10 tablet  0  . furosemide (LASIX) 40 MG tablet Take 1 tablet (40 mg total) by mouth daily.  30 tablet  11  . gabapentin (NEURONTIN) 100 MG capsule Take 100 mg by mouth 3 (three) times daily as needed. Take one capsule at bedtime. For continued pain  increase to three times a day.      . ibuprofen (ADVIL,MOTRIN) 600 MG tablet Take 1 tablet (600 mg total) by mouth every 8 (eight) hours as needed for pain.  30 tablet  5  . KLOR-CON M20 20 MEQ tablet TAKE 2 TABLETS (40 MEQ TOTAL) BY MOUTH DAILY.  60 tablet  6  . losartan (COZAAR) 50 MG tablet Take 50 mg by mouth daily.      . metoprolol succinate (TOPROL-XL) 100 MG 24 hr tablet TAKE 1 TABLET EVERY DAY  30 tablet  5  . mometasone (NASONEX) 50 MCG/ACT nasal spray Place 2 sprays into the nose daily as needed (nasal congestion).      . NASONEX 50 MCG/ACT nasal spray PLACE 2 SPRAYS INTO THE NOSE DAILY.  17 g  3  . omeprazole (PRILOSEC) 20 MG capsule Take 20 mg by mouth daily.         No current facility-administered medications on file prior to visit.   Review of Systems  Constitutional: Negative for unexpected weight change, or unusual diaphoresis  HENT: Negative for tinnitus.   Eyes: Negative for photophobia and visual disturbance.  Respiratory: Negative for choking and stridor.   Gastrointestinal: Negative for vomiting and blood in stool.  Genitourinary: Negative for hematuria  and decreased urine volume.  Musculoskeletal: Negative for acute joint swelling Skin: Negative for color change and wound.  Neurological: Negative for tremors and numbness other than noted  Psychiatric/Behavioral: Negative for decreased concentration or  hyperactivity.       Objective:   Physical Exam BP 150/96  Pulse 77  Temp(Src) 97.5 F (36.4 C) (Oral)  Wt 168 lb 6.4 oz (76.386 kg)  BMI 26.37 kg/m2  SpO2 98% VS noted, not ill Constitutional: Pt appears well-developed and well-nourished.  HENT: Head: NCAT.  Right Ear: External ear normal.  Left Ear: External ear normal.  Eyes: Conjunctivae and EOM are normal. Pupils are equal, round, and reactive to light.  Neck: Normal range of motion. Neck supple.  Cardiovascular: Normal rate and regular rhythm.   Pulmonary/Chest: Effort normal and breath sounds  normal. - no rales or wheezing  Abd:  Soft, NT, non-distended, + BS except mild low mid abd tender, no guarding or rebound Neurological: Pt is alert. Not confused  Skin: Skin is warm. No erythema.  Psychiatric: Pt behavior is normal. Thought content normal.     Assessment & Plan:

## 2012-08-14 NOTE — Assessment & Plan Note (Signed)
Consider tx for OAB if cx neg

## 2012-08-14 NOTE — Assessment & Plan Note (Signed)
Suspect recurrent uti, for urine study, Mild to mod, for antibx course,  to f/u any worsening symptoms or concerns

## 2012-08-14 NOTE — Assessment & Plan Note (Signed)
Pt reassrued, ok to cont prem vag cream asd

## 2012-08-14 NOTE — Assessment & Plan Note (Signed)
Mild persistent elv but stable overall by history and exam, recent data reviewed with pt, and pt to continue medical treatment as before,  to f/u any worsening symptoms or concerns BP Readings from Last 3 Encounters:  08/13/12 150/96  08/07/12 150/74  07/26/12 164/69

## 2012-08-15 LAB — URINE CULTURE: Organism ID, Bacteria: NO GROWTH

## 2012-08-16 ENCOUNTER — Encounter: Payer: Self-pay | Admitting: Internal Medicine

## 2012-08-18 ENCOUNTER — Telehealth: Payer: Self-pay | Admitting: Internal Medicine

## 2012-08-18 NOTE — Telephone Encounter (Signed)
The pt's eye doctor (Dr.Johnson) called and was concerned because she came in and reported hallucinations.  He sent her for a retinal scan, but wanted her primary care to follow up on this issue.  I called the patient and left a message for her to call back as soon as possible to be seen in the office.

## 2012-08-20 ENCOUNTER — Other Ambulatory Visit (INDEPENDENT_AMBULATORY_CARE_PROVIDER_SITE_OTHER): Payer: Medicare Other

## 2012-08-20 ENCOUNTER — Encounter: Payer: Self-pay | Admitting: Internal Medicine

## 2012-08-20 ENCOUNTER — Ambulatory Visit: Payer: Medicare Other | Admitting: Internal Medicine

## 2012-08-20 ENCOUNTER — Ambulatory Visit (INDEPENDENT_AMBULATORY_CARE_PROVIDER_SITE_OTHER): Payer: Medicare Other | Admitting: Internal Medicine

## 2012-08-20 VITALS — BP 134/90 | HR 96 | Temp 98.4°F | Wt 166.0 lb

## 2012-08-20 DIAGNOSIS — N898 Other specified noninflammatory disorders of vagina: Secondary | ICD-10-CM

## 2012-08-20 DIAGNOSIS — R300 Dysuria: Secondary | ICD-10-CM

## 2012-08-20 DIAGNOSIS — R443 Hallucinations, unspecified: Secondary | ICD-10-CM | POA: Insufficient documentation

## 2012-08-20 LAB — URINALYSIS, ROUTINE W REFLEX MICROSCOPIC
Bilirubin Urine: NEGATIVE
Hgb urine dipstick: NEGATIVE
Total Protein, Urine: NEGATIVE
Urine Glucose: NEGATIVE
Urobilinogen, UA: 0.2 (ref 0.0–1.0)

## 2012-08-20 MED ORDER — FLUCONAZOLE 100 MG PO TABS: 100.0000 mg | ORAL_TABLET | Freq: Every day | ORAL | Status: DC

## 2012-08-20 NOTE — Patient Instructions (Signed)
OK to finish the antibiotic that you have left Please take all new medication as prescribed - the vaginal yeast infection medication OK to continue the Gabapentin, since this is unlikely to have caused the hallucination you mentioned today Please continue all other medications as before Please go to the LAB in the Basement (turn left off the elevator) for the tests to be done today - just the urine test today You will be contacted by phone if any changes need to be made immediately.  Otherwise, you will receive a letter about your results with an explanation  Please remember to sign up for My Chart if you have not done so, as this will be important to you in the future with finding out test results, communicating by private email, and scheduling acute appointments online when needed.

## 2012-08-20 NOTE — Progress Notes (Signed)
Subjective:    Patient ID: [name], female    DOB: [birth year], 79 y.o.   MRN: [id number]  HPI  Here with son, pt severe HOH and very difficult historian with many references of unrelated issues and events from years ago, at least mod nervous, with what comes down to what seems to be onset yeast infection like vaginal d/c, whitish, thickened after too5 days antibx for ? UTI.  Also incidentally with dysuria onset this AM again similar to last visit with some nausea and lower abd discomfort, without fever, vomiting, chills, flank pain.  Also mentions one episode of what she realizes was hallucination seeing persons in  The bedroom who werent there, ? May have had previous but not clear, she thinks may be due to gabapentin since this was on the medication side effect list, and is not sure she wants to continue this. Past Medical History  Diagnosis Date  . History of hiatal hernia   . Mitral valve prolapse   . Arthritis   . Hyperlipemia   . Plantar fasciitis   . Hay fever   . GERD (gastroesophageal reflux disease)   . Palpitations   . Hypertension   . Neuromuscular disorder    Past Surgical History  Procedure Laterality Date  . Baldder suspension    . Foot surgery    . Tonsillectomy    . Abdominal hysterectomy      reports that she has never smoked. She has never used smokeless tobacco. She reports that she does not drink alcohol or use illicit drugs. family history is not on file. Allergies  Allergen Reactions  . Aleve (Naproxen Sodium) Swelling    Legs and feet   Current Outpatient Prescriptions on File Prior to Visit  Medication Sig Dispense Refill  . Calcium Glycerophosphate (PRELIEF PO) Take 1 tablet by mouth daily as needed. For acidic foods      . cephALEXin (KEFLEX) 500 MG capsule Take 1 capsule (500 mg total) by mouth 4 (four) times daily.  40 capsule  0  . conjugated estrogens (PREMARIN) vaginal cream Place vaginally daily.  42.5 g  12  . Cranberry 140-100-3  MG-MG-UNIT CAPS Take 1 capsule by mouth 4 (four) times daily.      . furosemide (LASIX) 40 MG tablet Take 1 tablet (40 mg total) by mouth daily.  30 tablet  11  . gabapentin (NEURONTIN) 100 MG capsule Take 100 mg by mouth 3 (three) times daily as needed. Take one capsule at bedtime. For continued pain increase to three times a day.      . ibuprofen (ADVIL,MOTRIN) 600 MG tablet Take 1 tablet (600 mg total) by mouth every 8 (eight) hours as needed for pain.  30 tablet  5  . KLOR-CON M20 20 MEQ tablet TAKE 2 TABLETS (40 MEQ TOTAL) BY MOUTH DAILY.  60 tablet  6  . losartan (COZAAR) 50 MG tablet Take 50 mg by mouth daily.      . metoprolol succinate (TOPROL-XL) 100 MG 24 hr tablet TAKE 1 TABLET EVERY DAY  30 tablet  5  . mometasone (NASONEX) 50 MCG/ACT nasal spray Place 2 sprays into the nose daily as needed (nasal congestion).      . NASONEX 50 MCG/ACT nasal spray PLACE 2 SPRAYS INTO THE NOSE DAILY.  17 g  3  . omeprazole (PRILOSEC) 20 MG capsule Take 20 mg by mouth daily.         No current facility-administered medications on file prior to visit.  Review of Systems essentially unable as pt would not answer questions directly, much tangential    Objective:   Physical Exam BP 134/90  Pulse 96  Temp(Src) 98.4 F (36.9 C) (Oral)  Wt 166 lb (75.297 kg)  BMI 25.99 kg/m2  SpO2 95% VS noted, fatigued, frail, very nervous, talking loudly and fast with much repeating HENT: Head: NCAT.  Right Ear: External ear normal.  Left Ear: External ear normal.  Eyes: Conjunctivae and EOM are normal. Pupils are equal, round, and reactive to light.  Neck: Normal range of motion. Neck supple.  Cardiovascular: Normal rate and regular rhythm.   Pulmonary/Chest: Effort normal and breath sounds without rales or wheezing.  Abd:  Soft,  non-distended, + BS, mild low mid abd tender Neurological: Pt is alert. Not confused , walks with cane Skin: Skin is warm. No erythema. No rash, GYN: deferred per pt Psychiatric:  2+ nervous    Assessment & Plan:

## 2012-08-21 LAB — URINE CULTURE
Colony Count: NO GROWTH
Organism ID, Bacteria: NO GROWTH

## 2012-08-21 NOTE — Assessment & Plan Note (Signed)
Etiology unclear, declines labs, I think likely ok to cont the gabapentin but she may not do this,  to f/u any worsening symptoms or concerns

## 2012-08-21 NOTE — Assessment & Plan Note (Signed)
Likely yeast, for diflucan asd,  to f/u any worsening symptoms or concerns  

## 2012-08-21 NOTE — Assessment & Plan Note (Signed)
Recurrent apaprently, ? Cystitis/urethritis - Mild to mod, for completing antibx,  to f/u any worsening symptoms or concerns

## 2012-08-22 ENCOUNTER — Other Ambulatory Visit: Payer: Self-pay | Admitting: Internal Medicine

## 2012-08-24 ENCOUNTER — Other Ambulatory Visit: Payer: Self-pay | Admitting: Internal Medicine

## 2012-08-25 ENCOUNTER — Emergency Department (HOSPITAL_COMMUNITY): Payer: Medicare Other

## 2012-08-25 ENCOUNTER — Inpatient Hospital Stay (HOSPITAL_COMMUNITY): Payer: Medicare Other

## 2012-08-25 ENCOUNTER — Inpatient Hospital Stay (HOSPITAL_COMMUNITY)
Admission: EM | Admit: 2012-08-25 | Discharge: 2012-08-30 | DRG: 470 | Disposition: A | Discharge status: Skilled Nursing Facility | Payer: Medicare Other | Attending: Internal Medicine | Admitting: Internal Medicine

## 2012-08-25 ENCOUNTER — Ambulatory Visit: Payer: Medicare Other | Admitting: Internal Medicine

## 2012-08-25 ENCOUNTER — Encounter (HOSPITAL_COMMUNITY): Payer: Self-pay | Admitting: *Deleted

## 2012-08-25 DIAGNOSIS — Z7901 Long term (current) use of anticoagulants: Secondary | ICD-10-CM

## 2012-08-25 DIAGNOSIS — M25561 Pain in right knee: Secondary | ICD-10-CM

## 2012-08-25 DIAGNOSIS — S72143A Displaced intertrochanteric fracture of unspecified femur, initial encounter for closed fracture: Principal | ICD-10-CM | POA: Diagnosis present

## 2012-08-25 DIAGNOSIS — S52601A Unspecified fracture of lower end of right ulna, initial encounter for closed fracture: Secondary | ICD-10-CM

## 2012-08-25 DIAGNOSIS — Z79899 Other long term (current) drug therapy: Secondary | ICD-10-CM

## 2012-08-25 DIAGNOSIS — K219 Gastro-esophageal reflux disease without esophagitis: Secondary | ICD-10-CM

## 2012-08-25 DIAGNOSIS — I10 Essential (primary) hypertension: Secondary | ICD-10-CM | POA: Diagnosis present

## 2012-08-25 DIAGNOSIS — R300 Dysuria: Secondary | ICD-10-CM

## 2012-08-25 DIAGNOSIS — C3412 Malignant neoplasm of upper lobe, left bronchus or lung: Secondary | ICD-10-CM

## 2012-08-25 DIAGNOSIS — H9190 Unspecified hearing loss, unspecified ear: Secondary | ICD-10-CM | POA: Diagnosis present

## 2012-08-25 DIAGNOSIS — R222 Localized swelling, mass and lump, trunk: Secondary | ICD-10-CM | POA: Diagnosis present

## 2012-08-25 DIAGNOSIS — S72001A Fracture of unspecified part of neck of right femur, initial encounter for closed fracture: Secondary | ICD-10-CM

## 2012-08-25 DIAGNOSIS — Y92009 Unspecified place in unspecified non-institutional (private) residence as the place of occurrence of the external cause: Secondary | ICD-10-CM

## 2012-08-25 DIAGNOSIS — E785 Hyperlipidemia, unspecified: Secondary | ICD-10-CM | POA: Diagnosis present

## 2012-08-25 DIAGNOSIS — M129 Arthropathy, unspecified: Secondary | ICD-10-CM | POA: Diagnosis present

## 2012-08-25 DIAGNOSIS — R339 Retention of urine, unspecified: Secondary | ICD-10-CM | POA: Diagnosis not present

## 2012-08-25 DIAGNOSIS — S52509A Unspecified fracture of the lower end of unspecified radius, initial encounter for closed fracture: Secondary | ICD-10-CM | POA: Diagnosis present

## 2012-08-25 DIAGNOSIS — M722 Plantar fascial fibromatosis: Secondary | ICD-10-CM | POA: Diagnosis present

## 2012-08-25 DIAGNOSIS — S52609A Unspecified fracture of lower end of unspecified ulna, initial encounter for closed fracture: Secondary | ICD-10-CM | POA: Diagnosis present

## 2012-08-25 DIAGNOSIS — Z66 Do not resuscitate: Secondary | ICD-10-CM | POA: Diagnosis present

## 2012-08-25 DIAGNOSIS — W1809XA Striking against other object with subsequent fall, initial encounter: Secondary | ICD-10-CM | POA: Diagnosis present

## 2012-08-25 DIAGNOSIS — N952 Postmenopausal atrophic vaginitis: Secondary | ICD-10-CM | POA: Diagnosis present

## 2012-08-25 DIAGNOSIS — E871 Hypo-osmolality and hyponatremia: Secondary | ICD-10-CM

## 2012-08-25 HISTORY — DX: Personal history of other diseases of the digestive system: Z87.19

## 2012-08-25 HISTORY — DX: Unspecified hearing loss, unspecified ear: H91.90

## 2012-08-25 LAB — URINALYSIS, ROUTINE W REFLEX MICROSCOPIC
Bilirubin Urine: NEGATIVE
Leukocytes, UA: NEGATIVE
Nitrite: NEGATIVE
Specific Gravity, Urine: 1.01 (ref 1.005–1.030)
Urobilinogen, UA: 0.2 mg/dL (ref 0.0–1.0)

## 2012-08-25 LAB — BASIC METABOLIC PANEL
BUN: 26 mg/dL — ABNORMAL HIGH (ref 6–23)
CO2: 26 mEq/L (ref 19–32)
Calcium: 9.2 mg/dL (ref 8.4–10.5)
Creatinine, Ser: 0.87 mg/dL (ref 0.50–1.10)
GFR calc non Af Amer: 55 mL/min — ABNORMAL LOW (ref >90)
Glucose, Bld: 140 mg/dL — ABNORMAL HIGH (ref 70–99)

## 2012-08-25 LAB — TYPE AND SCREEN

## 2012-08-25 LAB — CBC WITH DIFFERENTIAL/PLATELET
Eosinophils Absolute: 0.1 10*3/uL (ref 0.0–0.7)
Eosinophils Relative: 1 % (ref 0–5)
HCT: 37.2 % (ref 36.0–46.0)
Lymphocytes Relative: 14 % (ref 12–46)
Lymphs Abs: 1.5 10*3/uL (ref 0.7–4.0)
MCH: 31.8 pg (ref 26.0–34.0)
MCV: 91.6 fL (ref 78.0–100.0)
Monocytes Absolute: 0.7 10*3/uL (ref 0.1–1.0)
RBC: 4.06 MIL/uL (ref 3.87–5.11)
WBC: 10.9 10*3/uL — ABNORMAL HIGH (ref 4.0–10.5)

## 2012-08-25 LAB — ABO/RH: ABO/RH(D): A POS

## 2012-08-25 MED ORDER — DOCUSATE SODIUM 100 MG PO CAPS
100.0000 mg | ORAL_CAPSULE | Freq: Two times a day (BID) | ORAL | Status: DC
Start: 2012-08-25 — End: 2012-08-30
  Administered 2012-08-25 – 2012-08-30 (×9): 100 mg via ORAL
  Filled 2012-08-25 (×11): qty 1

## 2012-08-25 MED ORDER — GABAPENTIN 100 MG PO CAPS
100.0000 mg | ORAL_CAPSULE | Freq: Two times a day (BID) | ORAL | Status: DC | PRN
  Filled 2012-08-25: qty 1

## 2012-08-25 MED ORDER — ONDANSETRON HCL 4 MG/2ML IJ SOLN
4.0000 mg | Freq: Four times a day (QID) | INTRAMUSCULAR | Status: DC | PRN
  Administered 2012-08-25: 4 mg via INTRAVENOUS

## 2012-08-25 MED ORDER — MORPHINE SULFATE 2 MG/ML IJ SOLN
INTRAMUSCULAR | Status: AC
  Filled 2012-08-25: qty 1

## 2012-08-25 MED ORDER — IOHEXOL 300 MG/ML  SOLN
80.0000 mL | Freq: Once | INTRAMUSCULAR | Status: AC | PRN
  Administered 2012-08-25: 80 mL via INTRAVENOUS

## 2012-08-25 MED ORDER — PANTOPRAZOLE SODIUM 40 MG PO TBEC
40.0000 mg | DELAYED_RELEASE_TABLET | Freq: Every day | ORAL | Status: DC
  Administered 2012-08-27 – 2012-08-30 (×4): 40 mg via ORAL
  Filled 2012-08-25 (×5): qty 1

## 2012-08-25 MED ORDER — MORPHINE SULFATE 2 MG/ML IJ SOLN
0.5000 mg | INTRAMUSCULAR | Status: DC | PRN
  Administered 2012-08-25 – 2012-08-26 (×3): 0.5 mg via INTRAVENOUS
  Filled 2012-08-25 (×2): qty 1

## 2012-08-25 MED ORDER — GABAPENTIN 100 MG PO CAPS
100.0000 mg | ORAL_CAPSULE | Freq: Every day | ORAL | Status: DC
  Administered 2012-08-25 – 2012-08-29 (×5): 100 mg via ORAL
  Filled 2012-08-25 (×6): qty 1

## 2012-08-25 MED ORDER — MORPHINE SULFATE 4 MG/ML IJ SOLN
4.0000 mg | Freq: Once | INTRAMUSCULAR | Status: AC
  Administered 2012-08-25: 4 mg via INTRAVENOUS
  Filled 2012-08-25: qty 1

## 2012-08-25 MED ORDER — LOSARTAN POTASSIUM 50 MG PO TABS
50.0000 mg | ORAL_TABLET | Freq: Every day | ORAL | Status: DC
  Administered 2012-08-27 – 2012-08-30 (×4): 50 mg via ORAL
  Filled 2012-08-25 (×6): qty 1

## 2012-08-25 MED ORDER — HYDROCODONE-ACETAMINOPHEN 5-325 MG PO TABS
1.0000 | ORAL_TABLET | Freq: Four times a day (QID) | ORAL | Status: DC | PRN
  Administered 2012-08-25 – 2012-08-27 (×4): 1 via ORAL
  Administered 2012-08-27: 2 via ORAL
  Administered 2012-08-28 – 2012-08-29 (×4): 1 via ORAL
  Filled 2012-08-25 (×7): qty 1
  Filled 2012-08-25: qty 2
  Filled 2012-08-25 (×2): qty 1

## 2012-08-25 MED ORDER — ENOXAPARIN SODIUM 40 MG/0.4ML ~~LOC~~ SOLN
40.0000 mg | SUBCUTANEOUS | Status: DC
  Administered 2012-08-26: 40 mg via SUBCUTANEOUS
  Filled 2012-08-25 (×2): qty 0.4

## 2012-08-25 MED ORDER — ONDANSETRON HCL 4 MG/2ML IJ SOLN
INTRAMUSCULAR | Status: AC
  Filled 2012-08-25: qty 2

## 2012-08-25 MED ORDER — SENNA 8.6 MG PO TABS
1.0000 | ORAL_TABLET | Freq: Two times a day (BID) | ORAL | Status: DC
  Administered 2012-08-25 – 2012-08-30 (×9): 8.6 mg via ORAL
  Filled 2012-08-25 (×11): qty 1

## 2012-08-25 MED ORDER — SODIUM CHLORIDE 0.9 % IV SOLN
INTRAVENOUS | Status: AC
  Administered 2012-08-25: 18:00:00 via INTRAVENOUS

## 2012-08-25 MED ORDER — METOPROLOL SUCCINATE ER 100 MG PO TB24
100.0000 mg | ORAL_TABLET | Freq: Every day | ORAL | Status: DC
  Administered 2012-08-26 – 2012-08-30 (×5): 100 mg via ORAL
  Filled 2012-08-25 (×5): qty 1

## 2012-08-25 NOTE — H&P (Signed)
[facility] History and Physical  [name] ZOX:[id number] DOB: 1916/03/07 DOA: 08/25/2012   PCP: Illene Regulus, MD   Chief Complaint: mechanical fall with hip pain  HPI:  79 year old female with a history of hypertension, vaginitis, and GERD, presented to the emergency department after the patient experienced a fall. The patient's son was rushing to leave the kitchen this morning and missed a step falling backward onto the patient whom was behind him. The patient got stuck between a cabinet and complained of right-sided hip pain. There was no loss of consciousness. The patient has been in her usual state of health prior to today's fall. She normally uses a walker to assist with ambulation. The patient denies any fevers, chills, chest discomfort, dizziness, shortness of breath, nausea, vomiting, diarrhea, abdominal pain, dysuria, hematuria.  In the emergency department, chest x-ray revealed chronic bronchitic changes. Right hip x-ray revealed a displaced right femoral neck fracture. Right wrist x-ray revealed a displaced angulated distal ulnar metaphyseal fracture. BMP showed mild hyponatremia with a sodium of 132. CBC was essentially unremarkable. Urinalysis on the 23rd showed 0-2 WBCs. EKG was sinus rhythm without ST-T wave change. Assessment/Plan: Right femoral neck fracture -Ortho, Dr. Dion Saucier has been consulted -patient is medically stable for surgery today -Pain control -PT/OT after surgery Right wrist fracture -Management per Dr. Dion Saucier Hypertension -Continue losartan metoprolol succinate Apical lung mass -CT chest with contrast to clarify mass noted on CT of the cervical spine GERD -Continue Protonix Vaginitis -Patient finished a five-day course of fluconazole Hyponatremia -Mild -May be due to the patient's furosemide use -Discontinue furosemide and monitor        Past Medical History  Diagnosis Date  . History of hiatal hernia   . Mitral valve  prolapse   . Arthritis   . Hyperlipemia   . Plantar fasciitis   . Hay fever   . GERD (gastroesophageal reflux disease)   . Palpitations   . Hypertension   . Neuromuscular disorder    Past Surgical History  Procedure Laterality Date  . Baldder suspension    . Foot surgery    . Tonsillectomy    . Abdominal hysterectomy     Social History:  reports that she has never smoked. She has never used smokeless tobacco. She reports that she does not drink alcohol or use illicit drugs.   No family history on file.   Allergies  Allergen Reactions  . Aleve (Naproxen Sodium) Swelling    Legs and feet      Prior to Admission medications   Medication Sig Start Date End Date Taking? Authorizing Provider  Calcium Glycerophosphate (PRELIEF PO) Take 1 tablet by mouth daily as needed. For acidic foods    Historical Provider, MD  cephALEXin (KEFLEX) 500 MG capsule Take 1 capsule (500 mg total) by mouth 4 (four) times daily. 08/13/12   Corwin Levins, MD  conjugated estrogens (PREMARIN) vaginal cream Place vaginally daily. 05/26/12   Jacques Navy, MD  Cranberry 140-100-3 MG-MG-UNIT CAPS Take 1 capsule by mouth 4 (four) times daily.    Historical Provider, MD  fluconazole (DIFLUCAN) 100 MG tablet Take 1 tablet (100 mg total) by mouth daily. 08/20/12   Corwin Levins, MD  furosemide (LASIX) 40 MG tablet Take 1 tablet (40 mg total) by mouth daily. 05/06/12 05/06/13  Jacques Navy, MD  gabapentin (NEURONTIN) 100 MG capsule Take 100 mg by mouth 3 (three) times daily as needed. Take one capsule at bedtime. For continued pain increase to  three times a day. 11/18/11 11/17/12  Jacques Navy, MD  ibuprofen (ADVIL,MOTRIN) 600 MG tablet Take 1 tablet (600 mg total) by mouth every 8 (eight) hours as needed for pain. 06/24/12   Jacques Navy, MD  KLOR-CON M20 20 MEQ tablet TAKE 2 TABLETS (40 MEQ TOTAL) BY MOUTH DAILY. 07/08/12   Jacques Navy, MD  losartan (COZAAR) 50 MG tablet Take 50 mg by mouth daily.     Historical Provider, MD  metoprolol succinate (TOPROL-XL) 100 MG 24 hr tablet TAKE 1 TABLET EVERY DAY 02/11/12   Jacques Navy, MD  metoprolol succinate (TOPROL-XL) 100 MG 24 hr tablet TAKE 1 TABLET EVERY DAY 08/22/12   Jacques Navy, MD  mometasone (NASONEX) 50 MCG/ACT nasal spray Place 2 sprays into the nose daily as needed (nasal congestion).    Historical Provider, MD  NASONEX 50 MCG/ACT nasal spray PLACE 2 SPRAYS INTO THE NOSE DAILY. 05/21/12   Jacques Navy, MD  omeprazole (PRILOSEC) 20 MG capsule Take 20 mg by mouth daily.      Historical Provider, MD    Review of Systems:  Constitutional:  No weight loss, night sweats, Fevers, chills, fatigue.  Head&Eyes: No headache.  No vision loss.  ENT:  No Difficulty swallowing,Tooth/dental problems,Sore throat,   Cardio-vascular:  No chest pain, Orthopnea, PND, swelling in lower extremities,  dizziness, palpitations  GI:  No  abdominal pain, nausea, vomiting, diarrhea, loss of appetite, hematochezia,  Resp:  No shortness of breath with exertion or at rest. No cough. No coughing up of blood  Skin:  no rash or lesions.  GU:  no dysuria, change in color of urine, no urgency or frequency. No flank pain.  Musculoskeletal:  Complains of right hip pain and right wrist pain.  Neurologic: No headache, no dysesthesia, no focal weakness, no vision loss. No syncope  Physical Exam: Filed Vitals:   08/25/12 1115 08/25/12 1234 08/25/12 1235 08/25/12 1245  BP: 117/72 146/73  143/63  Pulse: 55  67 67  Resp: 20     SpO2: 97%  96% 94%   General:  A&O x 3, NAD, nontoxic, pleasant/cooperative Head/Eye: No conjunctival hemorrhage, no icterus, [place]/AT, No nystagmus ENT:  No icterus,  No thrush, good dentition, no pharyngeal exudate Neck:  No masses, no lymphadenpathy, no bruits CV:  RRR, no rub, no gallop, no S3 Lung:  CTAB, good air movement, no wheeze, no rhonchi Abdomen: soft/NT, +BS, nondistended, no peritoneal signs Ext: No cyanosis,  No rashes, No petechiae, No lymphangitis, No edema   Labs on Admission:  Basic Metabolic Panel:  Recent Labs Lab 08/25/12 1130  NA 132*  K 4.3  CL 94*  CO2 26  GLUCOSE 140*  BUN 26*  CREATININE 0.87  CALCIUM 9.2   Liver Function Tests: No results found for this basename: AST, ALT, ALKPHOS, BILITOT, PROT, ALBUMIN,  in the last 168 hours No results found for this basename: LIPASE, AMYLASE,  in the last 168 hours No results found for this basename: AMMONIA,  in the last 168 hours CBC:  Recent Labs Lab 08/25/12 1130  WBC 10.9*  NEUTROABS 8.6*  HGB 12.9  HCT 37.2  MCV 91.6  PLT 239   Cardiac Enzymes: No results found for this basename: CKTOTAL, CKMB, CKMBINDEX, TROPONINI,  in the last 168 hours BNP: No components found with this basename: POCBNP,  CBG: No results found for this basename: GLUCAP,  in the last 168 hours  Radiological Exams on Admission: Dg Chest 1  View  08/25/2012   *RADIOLOGY REPORT*  Clinical Data: Right hip and wrist injury post fall  CHEST - 1 VIEW  Comparison: 04/15/2011  Findings: Enlargement of cardiac silhouette. Tortuous aorta. Pulmonary vascularity normal. Chronic bronchitic changes and accentuation of perihilar markings little changed from previous study. No definite acute infiltrate, pleural effusion or pneumothorax. Bones appear diffusely demineralized with old post-traumatic deformity of the proximal right humerus.  IMPRESSION: Minimal enlargement of cardiac silhouette. Chronic bronchitic changes. Osteoporosis suspected.   Original Report Authenticated By: Ulyses Southward, M.D.   Dg Wrist Complete Right  08/25/2012   *RADIOLOGY REPORT*  Clinical Data: Right wrist injury, pain and deformity post fall  RIGHT WRIST - COMPLETE 3+ VIEW  Comparison: None  Findings: Diffuse osseous demineralization. Joint spaces preserved. Displaced distal right ulnar metaphyseal fracture with apex volar angulation. Transverse metaphyseal fracture distal right radius, with  mild apex volar angulation and dorsal displacement. No definite intra-articular extension at either fracture. Scattered chondrocalcinosis. No additional fracture or dislocation seen.  IMPRESSION: Displaced and angulated distal radial ulnar metaphyseal fractures as above.   Original Report Authenticated By: Ulyses Southward, M.D.   Dg Hip Complete Right  08/25/2012   *RADIOLOGY REPORT*  Clinical Data: Right hip pain post fall, injury  RIGHT HIP - COMPLETE 2+ VIEW  Comparison: 11/15/2010  Findings: Osseous demineralization. Symmetric hip and SI joints. Displaced right femoral neck fracture. No dislocation. No additional fracture or dislocation identified.  IMPRESSION: Displaced right femoral neck fracture. Suspect osteoporosis.   Original Report Authenticated By: Ulyses Southward, M.D.   Ct Head Wo Contrast  08/25/2012   *RADIOLOGY REPORT*  Clinical Data:  Fall with injury.  CT HEAD WITHOUT CONTRAST CT CERVICAL SPINE WITHOUT CONTRAST  Technique:  Multidetector CT imaging of the head and cervical spine was performed following the standard protocol without intravenous contrast.  Multiplanar CT image reconstructions of the cervical spine were also generated.  Comparison:  CT of the head dated 04/16/2011.  CT HEAD  Findings: Stable atrophy and mild small vessel disease. The brain demonstrates no evidence of hemorrhage, infarction, edema, mass effect, extra-axial fluid collection, hydrocephalus or mass lesion. The skull is unremarkable.  IMPRESSION: No acute findings.  Stable atrophy and mild small vessel disease.  CT CERVICAL SPINE  Findings: The cervical spine demonstrates normal alignment and no evidence of fracture or subluxation.  Mild degenerative changes are present throughout the cervical spine.  No soft tissue swelling or fluid collection is identified.  There is incidental detection of a spiculated mass like opacity at the left lung apex measuring approximately 2.3 x 4.1 cm.  Based on appearance, this is highly  worrisome for malignancy.  A mass like infiltrate could also conceivably have a similar appearance. Further characterization with CT of the chest is warranted.  IMPRESSION:  1.  No evidence of acute cervical injury. 2.  Left apical lung mass measuring up to 4 cm in maximal diameter and worrisome for malignancy.  Recommend further characterization with CT of the chest.   Original Report Authenticated By: Irish Lack, M.D.   Ct Cervical Spine Wo Contrast  08/25/2012   *RADIOLOGY REPORT*  Clinical Data:  Fall with injury.  CT HEAD WITHOUT CONTRAST CT CERVICAL SPINE WITHOUT CONTRAST  Technique:  Multidetector CT imaging of the head and cervical spine was performed following the standard protocol without intravenous contrast.  Multiplanar CT image reconstructions of the cervical spine were also generated.  Comparison:  CT of the head dated 04/16/2011.  CT HEAD  Findings: Stable  atrophy and mild small vessel disease. The brain demonstrates no evidence of hemorrhage, infarction, edema, mass effect, extra-axial fluid collection, hydrocephalus or mass lesion. The skull is unremarkable.  IMPRESSION: No acute findings.  Stable atrophy and mild small vessel disease.  CT CERVICAL SPINE  Findings: The cervical spine demonstrates normal alignment and no evidence of fracture or subluxation.  Mild degenerative changes are present throughout the cervical spine.  No soft tissue swelling or fluid collection is identified.  There is incidental detection of a spiculated mass like opacity at the left lung apex measuring approximately 2.3 x 4.1 cm.  Based on appearance, this is highly worrisome for malignancy.  A mass like infiltrate could also conceivably have a similar appearance. Further characterization with CT of the chest is warranted.  IMPRESSION:  1.  No evidence of acute cervical injury. 2.  Left apical lung mass measuring up to 4 cm in maximal diameter and worrisome for malignancy.  Recommend further characterization with  CT of the chest.   Original Report Authenticated By: Irish Lack, M.D.    EKG: Independently reviewed. Sinus rhythm, no ST-T wave change    Time spent:70 minutes Code Status:   DNR  Family Communication:   Family at bedside   Britiany Silbernagel, DO  [facility] Pager [phone]  If 7PM-7AM, please contact night-coverage [email or web address] Password [facility] 08/25/2012, 2:27 PM

## 2012-08-25 NOTE — Progress Notes (Signed)
Several phone calls made to determine the day and time of surgery with Ortho services. Dr. Charlann Boxer was discovered to be the on-call ortho doctor and was contacted approximately at 17:30 for any further instructions. Matt Babish PA-C returned the page from [facility] OR and stated to keep the patient NPO- they would come to [facility] once finished at [facility]. Orders received to hold the Lovenox for this evening. The daughter present at the bedside and updated with the plans. Will continue to monitor. Arnoldo Morale RN

## 2012-08-25 NOTE — ED Provider Notes (Signed)
History     CSN: [id number]  Arrival date & time 08/25/12  1049   First MD Initiated Contact with Patient 08/25/12 1052      Chief Complaint  Patient presents with  . Fall    (Consider location/radiation/quality/duration/timing/severity/associated sxs/prior treatment) The history is provided by the patient.  [name] is a 79 y.o. female history of hypertension, hyperlipidemia here presenting with fall. She said that her son slipped and fell on her and she fell and land on the kitchen floor this morning. She tried to stretch her right hand L. Fell on the right hand and right hip. She was unable to get up afterwards. She doesn't remember if she had any head injury or not. Denies syncope.    Past Medical History  Diagnosis Date  . History of hiatal hernia   . Mitral valve prolapse   . Arthritis   . Hyperlipemia   . Plantar fasciitis   . Hay fever   . GERD (gastroesophageal reflux disease)   . Palpitations   . Hypertension   . Neuromuscular disorder     Past Surgical History  Procedure Laterality Date  . Baldder suspension    . Foot surgery    . Tonsillectomy    . Abdominal hysterectomy      No family history on file.  History  Substance Use Topics  . Smoking status: Never Smoker   . Smokeless tobacco: Never Used  . Alcohol Use: No    OB History   Grav Para Term Preterm Abortions TAB SAB Ect Mult Living                  Review of Systems  Musculoskeletal:       R wrist and hip pain   All other systems reviewed and are negative.    Allergies  Aleve  Home Medications   Current Outpatient Rx  Name  Route  Sig  Dispense  Refill  . Calcium Glycerophosphate (PRELIEF PO)   Oral   Take 1 tablet by mouth daily as needed. For acidic foods         . cephALEXin (KEFLEX) 500 MG capsule   Oral   Take 1 capsule (500 mg total) by mouth 4 (four) times daily.   40 capsule   0   . conjugated estrogens (PREMARIN) vaginal cream   Vaginal   Place  vaginally daily.   42.5 g   12   . Cranberry 140-100-3 MG-MG-UNIT CAPS   Oral   Take 1 capsule by mouth 4 (four) times daily.         . fluconazole (DIFLUCAN) 100 MG tablet   Oral   Take 1 tablet (100 mg total) by mouth daily.   7 tablet   0   . furosemide (LASIX) 40 MG tablet   Oral   Take 1 tablet (40 mg total) by mouth daily.   30 tablet   11   . gabapentin (NEURONTIN) 100 MG capsule   Oral   Take 100 mg by mouth 3 (three) times daily as needed. Take one capsule at bedtime. For continued pain increase to three times a day.         . ibuprofen (ADVIL,MOTRIN) 600 MG tablet   Oral   Take 1 tablet (600 mg total) by mouth every 8 (eight) hours as needed for pain.   30 tablet   5   . KLOR-CON M20 20 MEQ tablet      TAKE 2 TABLETS (  40 MEQ TOTAL) BY MOUTH DAILY.   60 tablet   6   . losartan (COZAAR) 50 MG tablet   Oral   Take 50 mg by mouth daily.         . metoprolol succinate (TOPROL-XL) 100 MG 24 hr tablet      TAKE 1 TABLET EVERY DAY   30 tablet   5   . metoprolol succinate (TOPROL-XL) 100 MG 24 hr tablet      TAKE 1 TABLET EVERY DAY   30 tablet   5   . mometasone (NASONEX) 50 MCG/ACT nasal spray   Nasal   Place 2 sprays into the nose daily as needed (nasal congestion).         . NASONEX 50 MCG/ACT nasal spray      PLACE 2 SPRAYS INTO THE NOSE DAILY.   17 g   3   . omeprazole (PRILOSEC) 20 MG capsule   Oral   Take 20 mg by mouth daily.             BP 143/63  Pulse 67  Resp 20  SpO2 94%  Physical Exam  Nursing note and vitals reviewed. Constitutional:  Chronically ill, uncomfortable   HENT:  Head: Normocephalic and atraumatic.  Mouth/Throat: Oropharynx is clear and moist.  Eyes: Conjunctivae are normal. Pupils are equal, round, and reactive to light.  Neck: Normal range of motion. Neck supple.  Cardiovascular: Normal rate, regular rhythm and normal heart sounds.   Pulmonary/Chest: Effort normal and breath sounds normal. No  respiratory distress. She has no wheezes. She has no rales.  Abdominal: Soft. Bowel sounds are normal. She exhibits no distension. There is no tenderness. There is no rebound.  Musculoskeletal:  R wrist with obvious deformity. 2+ pulses, able to move fingers. Unable to range R hip. 2+ pulses R leg able to move R knee and ankles. Pelvis stable. No chest tenderness   Neurological: She is alert.  Nl sensation throughout   Skin: Skin is warm and dry.  Psychiatric: She has a normal mood and affect. Her behavior is normal. Judgment and thought content normal.    ED Course  Procedures (including critical care time)  Labs Reviewed  CBC WITH DIFFERENTIAL - Abnormal; Notable for the following:    WBC 10.9 (*)    Neutrophils Relative % 79 (*)    Neutro Abs 8.6 (*)    All other components within normal limits  BASIC METABOLIC PANEL - Abnormal; Notable for the following:    Sodium 132 (*)    Chloride 94 (*)    Glucose, Bld 140 (*)    BUN 26 (*)    GFR calc non Af Amer 55 (*)    GFR calc Af Amer 64 (*)    All other components within normal limits  PROTIME-INR  URINALYSIS, ROUTINE W REFLEX MICROSCOPIC  TYPE AND SCREEN  ABO/RH   Dg Chest 1 View  08/25/2012   *RADIOLOGY REPORT*  Clinical Data: Right hip and wrist injury post fall  CHEST - 1 VIEW  Comparison: 04/15/2011  Findings: Enlargement of cardiac silhouette. Tortuous aorta. Pulmonary vascularity normal. Chronic bronchitic changes and accentuation of perihilar markings little changed from previous study. No definite acute infiltrate, pleural effusion or pneumothorax. Bones appear diffusely demineralized with old post-traumatic deformity of the proximal right humerus.  IMPRESSION: Minimal enlargement of cardiac silhouette. Chronic bronchitic changes. Osteoporosis suspected.   Original Report Authenticated By: Ulyses Southward, M.D.   Dg Wrist Complete Right  08/25/2012   *  RADIOLOGY REPORT*  Clinical Data: Right wrist injury, pain and deformity post  fall  RIGHT WRIST - COMPLETE 3+ VIEW  Comparison: None  Findings: Diffuse osseous demineralization. Joint spaces preserved. Displaced distal right ulnar metaphyseal fracture with apex volar angulation. Transverse metaphyseal fracture distal right radius, with mild apex volar angulation and dorsal displacement. No definite intra-articular extension at either fracture. Scattered chondrocalcinosis. No additional fracture or dislocation seen.  IMPRESSION: Displaced and angulated distal radial ulnar metaphyseal fractures as above.   Original Report Authenticated By: Ulyses Southward, M.D.   Dg Hip Complete Right  08/25/2012   *RADIOLOGY REPORT*  Clinical Data: Right hip pain post fall, injury  RIGHT HIP - COMPLETE 2+ VIEW  Comparison: 11/15/2010  Findings: Osseous demineralization. Symmetric hip and SI joints. Displaced right femoral neck fracture. No dislocation. No additional fracture or dislocation identified.  IMPRESSION: Displaced right femoral neck fracture. Suspect osteoporosis.   Original Report Authenticated By: Ulyses Southward, M.D.   Ct Head Wo Contrast  08/25/2012   *RADIOLOGY REPORT*  Clinical Data:  Fall with injury.  CT HEAD WITHOUT CONTRAST CT CERVICAL SPINE WITHOUT CONTRAST  Technique:  Multidetector CT imaging of the head and cervical spine was performed following the standard protocol without intravenous contrast.  Multiplanar CT image reconstructions of the cervical spine were also generated.  Comparison:  CT of the head dated 04/16/2011.  CT HEAD  Findings: Stable atrophy and mild small vessel disease. The brain demonstrates no evidence of hemorrhage, infarction, edema, mass effect, extra-axial fluid collection, hydrocephalus or mass lesion. The skull is unremarkable.  IMPRESSION: No acute findings.  Stable atrophy and mild small vessel disease.  CT CERVICAL SPINE  Findings: The cervical spine demonstrates normal alignment and no evidence of fracture or subluxation.  Mild degenerative changes are present  throughout the cervical spine.  No soft tissue swelling or fluid collection is identified.  There is incidental detection of a spiculated mass like opacity at the left lung apex measuring approximately 2.3 x 4.1 cm.  Based on appearance, this is highly worrisome for malignancy.  A mass like infiltrate could also conceivably have a similar appearance. Further characterization with CT of the chest is warranted.  IMPRESSION:  1.  No evidence of acute cervical injury. 2.  Left apical lung mass measuring up to 4 cm in maximal diameter and worrisome for malignancy.  Recommend further characterization with CT of the chest.   Original Report Authenticated By: Irish Lack, M.D.   Ct Cervical Spine Wo Contrast  08/25/2012   *RADIOLOGY REPORT*  Clinical Data:  Fall with injury.  CT HEAD WITHOUT CONTRAST CT CERVICAL SPINE WITHOUT CONTRAST  Technique:  Multidetector CT imaging of the head and cervical spine was performed following the standard protocol without intravenous contrast.  Multiplanar CT image reconstructions of the cervical spine were also generated.  Comparison:  CT of the head dated 04/16/2011.  CT HEAD  Findings: Stable atrophy and mild small vessel disease. The brain demonstrates no evidence of hemorrhage, infarction, edema, mass effect, extra-axial fluid collection, hydrocephalus or mass lesion. The skull is unremarkable.  IMPRESSION: No acute findings.  Stable atrophy and mild small vessel disease.  CT CERVICAL SPINE  Findings: The cervical spine demonstrates normal alignment and no evidence of fracture or subluxation.  Mild degenerative changes are present throughout the cervical spine.  No soft tissue swelling or fluid collection is identified.  There is incidental detection of a spiculated mass like opacity at the left lung apex measuring approximately 2.3 x 4.1  cm.  Based on appearance, this is highly worrisome for malignancy.  A mass like infiltrate could also conceivably have a similar appearance.  Further characterization with CT of the chest is warranted.  IMPRESSION:  1.  No evidence of acute cervical injury. 2.  Left apical lung mass measuring up to 4 cm in maximal diameter and worrisome for malignancy.  Recommend further characterization with CT of the chest.   Original Report Authenticated By: Irish Lack, M.D.     No diagnosis found.   Date: 08/25/2012  Rate: 62  Rhythm: normal sinus rhythm  QRS Axis: normal  Intervals: normal  ST/T Wave abnormalities: normal  Conduction Disutrbances:none  Narrative Interpretation:   Old EKG Reviewed: unchanged    MDM  EISA CONAWAY is a 79 y.o. female here with s/p fall. Need to r/o R hip and R wrist fracture. Will get preop labs.   1:24 PM Xray showed R femoral neck fracture and R colles fracture. CT head/neck unremarkable. She has been f/u with sports medicine. I called Dr. Dion Saucier in the group and they said that patient hasn't seen them in awhile and wants on call ortho to operate on her. I called Dr. Charlann Boxer, who will see her. I called Dr. Arbutus Leas, who will admit her on med/surg.        Richardean Canal, MD 08/25/12 1326

## 2012-08-25 NOTE — Progress Notes (Signed)
Orthopedic Tech Progress Note Patient Details:  [name] [birth year] [id number]  Ortho Devices Type of Ortho Device: Sugartong splint Ortho Device/Splint Interventions: Application   Shawnie Pons 08/25/2012, 3:06 PM

## 2012-08-25 NOTE — Progress Notes (Signed)
Orthopedic Tech Progress Note Patient Details:  [name] December 16, 1915 [id number] Unable to use properly Patient ID: [name], female   DOB: October 18, 1915, 79 y.o.   MRN: [id number]   [name] 08/25/2012, 4:34 PM

## 2012-08-25 NOTE — ED Notes (Addendum)
Paged Ortho  

## 2012-08-25 NOTE — ED Notes (Signed)
MD at bedside. 

## 2012-08-25 NOTE — ED Notes (Signed)
Per EMS- pt was knocked down and landed on kitchen floor. Pt has noted deformity to rt wrist and shortening and rotation to rt hip. Pt was diaphoretic with EMS. Denies LOC. Pt received of fentanyl and 4 mg of zofran.

## 2012-08-26 ENCOUNTER — Encounter (HOSPITAL_COMMUNITY)
Admission: EM | Disposition: A | Discharge status: Skilled Nursing Facility | Payer: Self-pay | Source: Home / Self Care | Attending: Internal Medicine

## 2012-08-26 ENCOUNTER — Inpatient Hospital Stay (HOSPITAL_COMMUNITY): Payer: Medicare Other | Admitting: Certified Registered"

## 2012-08-26 ENCOUNTER — Encounter (HOSPITAL_COMMUNITY): Payer: Self-pay | Admitting: Certified Registered"

## 2012-08-26 ENCOUNTER — Inpatient Hospital Stay (HOSPITAL_COMMUNITY): Payer: Medicare Other

## 2012-08-26 DIAGNOSIS — IMO0002 Reserved for concepts with insufficient information to code with codable children: Secondary | ICD-10-CM

## 2012-08-26 DIAGNOSIS — S72009A Fracture of unspecified part of neck of unspecified femur, initial encounter for closed fracture: Secondary | ICD-10-CM

## 2012-08-26 HISTORY — PX: HIP ARTHROPLASTY: SHX981

## 2012-08-26 LAB — SURGICAL PCR SCREEN
MRSA, PCR: NEGATIVE
Staphylococcus aureus: NEGATIVE

## 2012-08-26 SURGERY — HEMIARTHROPLASTY, HIP, DIRECT ANTERIOR APPROACH, FOR FRACTURE
Anesthesia: General | Site: Hip | Laterality: Right | Wound class: Clean

## 2012-08-26 MED ORDER — ENOXAPARIN SODIUM 40 MG/0.4ML ~~LOC~~ SOLN
40.0000 mg | SUBCUTANEOUS | Status: DC
  Administered 2012-08-27 – 2012-08-29 (×3): 40 mg via SUBCUTANEOUS
  Filled 2012-08-26 (×4): qty 0.4

## 2012-08-26 MED ORDER — FLEET ENEMA 7-19 GM/118ML RE ENEM: 1.0000 | ENEMA | Freq: Once | RECTAL | Status: AC | PRN

## 2012-08-26 MED ORDER — METOCLOPRAMIDE HCL 5 MG PO TABS
5.0000 mg | ORAL_TABLET | Freq: Three times a day (TID) | ORAL | Status: DC | PRN
  Filled 2012-08-26: qty 2

## 2012-08-26 MED ORDER — PROPOFOL 10 MG/ML IV BOLUS
INTRAVENOUS | Status: DC | PRN
  Administered 2012-08-26: 100 mg via INTRAVENOUS

## 2012-08-26 MED ORDER — LIDOCAINE HCL (CARDIAC) 20 MG/ML IV SOLN
INTRAVENOUS | Status: DC | PRN
  Administered 2012-08-26: 40 mg via INTRAVENOUS

## 2012-08-26 MED ORDER — DEXTROSE 5 % IV SOLN
500.0000 mg | Freq: Four times a day (QID) | INTRAVENOUS | Status: DC | PRN
  Filled 2012-08-26: qty 5

## 2012-08-26 MED ORDER — ONDANSETRON HCL 4 MG/2ML IJ SOLN: 4.0000 mg | Freq: Four times a day (QID) | INTRAMUSCULAR | Status: DC | PRN

## 2012-08-26 MED ORDER — BISACODYL 10 MG RE SUPP
10.0000 mg | Freq: Every day | RECTAL | Status: DC | PRN
  Administered 2012-08-27 – 2012-08-30 (×2): 10 mg via RECTAL
  Filled 2012-08-26 (×2): qty 1

## 2012-08-26 MED ORDER — MENTHOL 3 MG MT LOZG: 1.0000 | LOZENGE | OROMUCOSAL | Status: DC | PRN

## 2012-08-26 MED ORDER — ONDANSETRON HCL 4 MG/2ML IJ SOLN
4.0000 mg | Freq: Once | INTRAMUSCULAR | Status: AC | PRN
  Administered 2012-08-26: 4 mg via INTRAVENOUS

## 2012-08-26 MED ORDER — FERROUS SULFATE 325 (65 FE) MG PO TABS
325.0000 mg | ORAL_TABLET | Freq: Three times a day (TID) | ORAL | Status: DC
  Administered 2012-08-27 – 2012-08-30 (×10): 325 mg via ORAL
  Filled 2012-08-26 (×14): qty 1

## 2012-08-26 MED ORDER — FENTANYL CITRATE 0.05 MG/ML IJ SOLN
INTRAMUSCULAR | Status: DC | PRN
  Administered 2012-08-26: 100 ug via INTRAVENOUS
  Administered 2012-08-26: 150 ug via INTRAVENOUS

## 2012-08-26 MED ORDER — GLYCOPYRROLATE 0.2 MG/ML IJ SOLN
INTRAMUSCULAR | Status: DC | PRN
  Administered 2012-08-26: .4 mg via INTRAVENOUS

## 2012-08-26 MED ORDER — FENTANYL CITRATE 0.05 MG/ML IJ SOLN: 25.0000 ug | INTRAMUSCULAR | Status: DC | PRN

## 2012-08-26 MED ORDER — SODIUM CHLORIDE 0.9 % IR SOLN
Status: DC | PRN
  Administered 2012-08-26: 1000 mL

## 2012-08-26 MED ORDER — LACTATED RINGERS IV SOLN
INTRAVENOUS | Status: DC
  Administered 2012-08-26: 12:00:00 via INTRAVENOUS

## 2012-08-26 MED ORDER — METOCLOPRAMIDE HCL 5 MG/ML IJ SOLN: 5.0000 mg | Freq: Three times a day (TID) | INTRAMUSCULAR | Status: DC | PRN

## 2012-08-26 MED ORDER — ONDANSETRON HCL 4 MG PO TABS: 4.0000 mg | ORAL_TABLET | Freq: Four times a day (QID) | ORAL | Status: DC | PRN

## 2012-08-26 MED ORDER — MORPHINE SULFATE 2 MG/ML IJ SOLN
0.5000 mg | INTRAMUSCULAR | Status: DC | PRN
  Administered 2012-08-26: 1 mg via INTRAVENOUS
  Filled 2012-08-26: qty 1

## 2012-08-26 MED ORDER — SODIUM CHLORIDE 0.9 % IV SOLN
10.0000 mg | INTRAVENOUS | Status: DC | PRN
  Administered 2012-08-26: 10 ug/min via INTRAVENOUS
  Administered 2012-08-26: 5 ug/min via INTRAVENOUS

## 2012-08-26 MED ORDER — LACTATED RINGERS IV SOLN
INTRAVENOUS | Status: DC | PRN
  Administered 2012-08-26: 12:00:00 via INTRAVENOUS

## 2012-08-26 MED ORDER — PHENOL 1.4 % MT LIQD: 1.0000 | OROMUCOSAL | Status: DC | PRN

## 2012-08-26 MED ORDER — ROCURONIUM BROMIDE 100 MG/10ML IV SOLN
INTRAVENOUS | Status: DC | PRN
Start: 2012-08-26 — End: 2012-08-26
  Administered 2012-08-26: 40 mg via INTRAVENOUS

## 2012-08-26 MED ORDER — NEOSTIGMINE METHYLSULFATE 1 MG/ML IJ SOLN
INTRAMUSCULAR | Status: DC | PRN
  Administered 2012-08-26: 3 mg via INTRAVENOUS

## 2012-08-26 MED ORDER — METHOCARBAMOL 500 MG PO TABS
500.0000 mg | ORAL_TABLET | Freq: Four times a day (QID) | ORAL | Status: DC | PRN
  Administered 2012-08-28: 500 mg via ORAL
  Filled 2012-08-26: qty 1

## 2012-08-26 MED ORDER — ONDANSETRON HCL 4 MG/2ML IJ SOLN
INTRAMUSCULAR | Status: AC
  Filled 2012-08-26: qty 2

## 2012-08-26 MED ORDER — CEFAZOLIN SODIUM 1-5 GM-% IV SOLN
INTRAVENOUS | Status: AC
  Filled 2012-08-26: qty 100

## 2012-08-26 MED ORDER — ONDANSETRON HCL 4 MG/2ML IJ SOLN
INTRAMUSCULAR | Status: DC | PRN
  Administered 2012-08-26: 4 mg via INTRAVENOUS

## 2012-08-26 MED ORDER — POLYETHYLENE GLYCOL 3350 17 G PO PACK
17.0000 g | PACK | Freq: Every day | ORAL | Status: DC | PRN
  Administered 2012-08-27 – 2012-08-28 (×2): 17 g via ORAL
  Filled 2012-08-26 (×2): qty 1

## 2012-08-26 MED ORDER — CEFAZOLIN SODIUM-DEXTROSE 2-3 GM-% IV SOLR
2.0000 g | Freq: Four times a day (QID) | INTRAVENOUS | Status: AC
  Administered 2012-08-26 – 2012-08-27 (×2): 2 g via INTRAVENOUS
  Filled 2012-08-26 (×2): qty 50

## 2012-08-26 MED ORDER — CEFAZOLIN SODIUM-DEXTROSE 2-3 GM-% IV SOLR
INTRAVENOUS | Status: DC | PRN
  Administered 2012-08-26: 2 g via INTRAVENOUS

## 2012-08-26 MED ORDER — SODIUM CHLORIDE 0.9 % IV SOLN
INTRAVENOUS | Status: DC
  Administered 2012-08-26 – 2012-08-29 (×4): via INTRAVENOUS
  Filled 2012-08-26 (×8): qty 1000

## 2012-08-26 SURGICAL SUPPLY — 50 items
BLADE SAW SAG 73X25 THK (BLADE) ×1
BLADE SAW SGTL 73X25 THK (BLADE) ×2 IMPLANT
BRUSH FEMORAL CANAL (MISCELLANEOUS) IMPLANT
CLOTH BEACON ORANGE TIMEOUT ST (SAFETY) ×3 IMPLANT
COVER SURGICAL LIGHT HANDLE (MISCELLANEOUS) ×3 IMPLANT
DRAPE INCISE IOBAN 85X60 (DRAPES) ×3 IMPLANT
DRAPE ORTHO SPLIT 77X108 STRL (DRAPES) ×6
DRAPE SURG ORHT 6 SPLT 77X108 (DRAPES) ×4 IMPLANT
DRAPE U-SHAPE 47X51 STRL (DRAPES) ×3 IMPLANT
DRSG AQUACEL AG ADV 3.5X10 (GAUZE/BANDAGES/DRESSINGS) ×2 IMPLANT
DRSG MEPILEX BORDER 4X8 (GAUZE/BANDAGES/DRESSINGS) ×3 IMPLANT
DURAPREP 26ML APPLICATOR (WOUND CARE) ×3 IMPLANT
ELECT REM PT RETURN 9FT ADLT (ELECTROSURGICAL) ×3
ELECTRODE REM PT RTRN 9FT ADLT (ELECTROSURGICAL) ×2 IMPLANT
EVACUATOR 1/8 PVC DRAIN (DRAIN) ×3 IMPLANT
FACESHIELD LNG OPTICON STERILE (SAFETY) ×6 IMPLANT
GLOVE BIOGEL PI IND STRL 7.5 (GLOVE) ×2 IMPLANT
GLOVE BIOGEL PI IND STRL 8 (GLOVE) ×4 IMPLANT
GLOVE BIOGEL PI INDICATOR 7.5 (GLOVE) ×1
GLOVE BIOGEL PI INDICATOR 8 (GLOVE) ×2
GLOVE ECLIPSE 8.0 STRL XLNG CF (GLOVE) ×3 IMPLANT
GLOVE ORTHO TXT STRL SZ7.5 (GLOVE) ×3 IMPLANT
GLOVE SURG ORTHO 8.0 STRL STRW (GLOVE) ×3 IMPLANT
GOWN STRL NON-REIN LRG LVL3 (GOWN DISPOSABLE) ×9 IMPLANT
GOWN STRL REIN 3XL XLG LVL4 (GOWN DISPOSABLE) ×3 IMPLANT
HANDPIECE INTERPULSE COAX TIP (DISPOSABLE)
IMMOBILIZER KNEE 22 UNIV (SOFTGOODS) ×3 IMPLANT
KIT BASIN OR (CUSTOM PROCEDURE TRAY) ×3 IMPLANT
KIT ROOM TURNOVER OR (KITS) ×3 IMPLANT
MANIFOLD NEPTUNE II (INSTRUMENTS) ×3 IMPLANT
NS IRRIG 1000ML POUR BTL (IV SOLUTION) ×3 IMPLANT
PACK TOTAL JOINT (CUSTOM PROCEDURE TRAY) ×3 IMPLANT
PAD ARMBOARD 7.5X6 YLW CONV (MISCELLANEOUS) ×6 IMPLANT
PRESSURIZER FEMORAL UNIV (MISCELLANEOUS) IMPLANT
SET HNDPC FAN SPRY TIP SCT (DISPOSABLE) IMPLANT
SPECIMEN JAR MEDIUM (MISCELLANEOUS) ×3 IMPLANT
SPONGE LAP 4X18 X RAY DECT (DISPOSABLE) ×6 IMPLANT
STAPLER VISISTAT (STAPLE) ×3 IMPLANT
STAPLER VISISTAT 35W (STAPLE) IMPLANT
STRIP CLOSURE SKIN 1/2X4 (GAUZE/BANDAGES/DRESSINGS) ×3 IMPLANT
SUT MNCRL AB 4-0 PS2 18 (SUTURE) IMPLANT
SUT VIC AB 1 CT1 27 (SUTURE)
SUT VIC AB 1 CT1 27XBRD ANBCTR (SUTURE) IMPLANT
SUT VIC AB 2-0 CT1 27 (SUTURE)
SUT VIC AB 2-0 CT1 TAPERPNT 27 (SUTURE) IMPLANT
TOWEL OR 17X24 6PK STRL BLUE (TOWEL DISPOSABLE) ×3 IMPLANT
TOWEL OR 17X26 10 PK STRL BLUE (TOWEL DISPOSABLE) ×3 IMPLANT
TOWER CARTRIDGE SMART MIX (DISPOSABLE) IMPLANT
TRAY FOLEY CATH 14FR (SET/KITS/TRAYS/PACK) IMPLANT
WATER STERILE IRR 1000ML POUR (IV SOLUTION) ×12 IMPLANT

## 2012-08-26 NOTE — Anesthesia Preprocedure Evaluation (Signed)
Anesthesia Evaluation  Patient identified by MRN, date of birth, ID band Patient awake    Reviewed: Allergy & Precautions, H&P , NPO status , Patient's Chart, lab work & pertinent test results  Airway Mallampati: II  Neck ROM: full    Dental   Pulmonary          Cardiovascular hypertension,     Neuro/Psych  Neuromuscular disease    GI/Hepatic hiatal hernia, GERD-  ,  Endo/Other    Renal/GU      Musculoskeletal  (+) Arthritis -,   Abdominal   Peds  Hematology   Anesthesia Other Findings   Reproductive/Obstetrics                           Anesthesia Physical Anesthesia Plan  ASA: III  Anesthesia Plan: General   Post-op Pain Management:    Induction: Intravenous  Airway Management Planned: Oral ETT  Additional Equipment:   Intra-op Plan:   Post-operative Plan: Extubation in OR  Informed Consent: I have reviewed the patients History and Physical, chart, labs and discussed the procedure including the risks, benefits and alternatives for the proposed anesthesia with the patient or authorized representative who has indicated his/her understanding and acceptance.     Plan Discussed with: CRNA, Anesthesiologist and Surgeon  Anesthesia Plan Comments:         Anesthesia Quick Evaluation

## 2012-08-26 NOTE — Consult Note (Signed)
Reason for Consult: 1. Right distal radius fracture, 2. Right femoral neck fracture Referring Physician: Bettyjo, [name] is an 79 y.o. female.  HPI: 79 yo female with a history of hypertension, vaginitis, and GERD, presented to the emergency department after the patient experienced a fall. The patient's son was rushing to leave the kitchen this morning and missed a step falling backward onto the patient whom was behind him. The patient got stuck between a cabinet and complained of right-sided hip pain. There was no loss of consciousness. The patient has been in her usual state of health prior to today's fall. She normally uses a walker to assist with ambulation. The patient denies any fevers, chills, chest discomfort, dizziness, shortness of breath, nausea, vomiting, diarrhea, abdominal pain, dysuria, hematuria.    Past Medical History  Diagnosis Date  . History of hiatal hernia   . Mitral valve prolapse   . Plantar fasciitis   . Hay fever   . GERD (gastroesophageal reflux disease)   . Palpitations   . Hypertension   . Neuromuscular disorder   . Deafness     "very HOH; even w/hearing aides we have to communicate via writing things down for her to read" (08/25/2012)  . H/O hiatal hernia   . Arthritis     "a little all over" (08/25/2012)    Past Surgical History  Procedure Laterality Date  . Foot surgery Bilateral ~ 2000    "pinched nerve on feet" (08/25/2012)  . Vesicovaginal fistula closure w/ tah    . Abdominal hysterectomy  1998  . Tonsillectomy and adenoidectomy  1940's  . Cesarean section  1952  . Knee arthroscopy w/ meniscectomy Left 07/15/2010    partial medial and lateral meniscectomies.Hattie Perch 07/15/2010 (08/25/2012)    History reviewed. No pertinent family history.  Social History:  reports that she has never smoked. She has never used smokeless tobacco. She reports that she does not drink alcohol or use illicit drugs.  Allergies:  Allergies  Allergen  Reactions  . Aleve (Naproxen Sodium) Swelling    Legs and feet    Medications:  I have reviewed the patient's current medications. Scheduled: . docusate sodium  100 mg Oral BID  . enoxaparin (LOVENOX) injection  40 mg Subcutaneous Q24H  . gabapentin  100 mg Oral QHS  . losartan  50 mg Oral Daily  . metoprolol succinate  100 mg Oral Daily  . pantoprazole  40 mg Oral Daily  . senna  1 tablet Oral BID    Results for orders placed during the hospital encounter of 08/25/12 (from the past 24 hour(s))  CBC WITH DIFFERENTIAL     Status: Abnormal   Collection Time    08/25/12 11:30 AM      Result Value Range   WBC 10.9 (*) 4.0 - 10.5 K/uL   RBC 4.06  3.87 - 5.11 MIL/uL   Hemoglobin 12.9  12.0 - 15.0 g/dL   HCT 16.1  09.6 - 04.5 %   MCV 91.6  78.0 - 100.0 fL   MCH 31.8  26.0 - 34.0 pg   MCHC 34.7  30.0 - 36.0 g/dL   RDW 40.9  81.1 - 91.4 %   Platelets 239  150 - 400 K/uL   Neutrophils Relative % 79 (*) 43 - 77 %   Neutro Abs 8.6 (*) 1.7 - 7.7 K/uL   Lymphocytes Relative 14  12 - 46 %   Lymphs Abs 1.5  0.7 - 4.0 K/uL  Monocytes Relative 6  3 - 12 %   Monocytes Absolute 0.7  0.1 - 1.0 K/uL   Eosinophils Relative 1  0 - 5 %   Eosinophils Absolute 0.1  0.0 - 0.7 K/uL   Basophils Relative 0  0 - 1 %   Basophils Absolute 0.0  0.0 - 0.1 K/uL  BASIC METABOLIC PANEL     Status: Abnormal   Collection Time    08/25/12 11:30 AM      Result Value Range   Sodium 132 (*) 135 - 145 mEq/L   Potassium 4.3  3.5 - 5.1 mEq/L   Chloride 94 (*) 96 - 112 mEq/L   CO2 26  19 - 32 mEq/L   Glucose, Bld 140 (*) 70 - 99 mg/dL   BUN 26 (*) 6 - 23 mg/dL   Creatinine, Ser 9.62  0.50 - 1.10 mg/dL   Calcium 9.2  8.4 - 95.2 mg/dL   GFR calc non Af Amer 55 (*) >90 mL/min   GFR calc Af Amer 64 (*) >90 mL/min  PROTIME-INR     Status: None   Collection Time    08/25/12 11:30 AM      Result Value Range   Prothrombin Time 12.9  11.6 - 15.2 seconds   INR 0.98  0.00 - 1.49  TYPE AND SCREEN     Status: None    Collection Time    08/25/12 11:30 AM      Result Value Range   ABO/RH(D) A POS     Antibody Screen NEG     Sample Expiration 08/28/2012    ABO/RH     Status: None   Collection Time    08/25/12 11:30 AM      Result Value Range   ABO/RH(D) A POS    URINALYSIS, ROUTINE W REFLEX MICROSCOPIC     Status: None   Collection Time    08/25/12  1:38 PM      Result Value Range   Color, Urine YELLOW  YELLOW   APPearance CLEAR  CLEAR   Specific Gravity, Urine 1.010  1.005 - 1.030   pH 6.0  5.0 - 8.0   Glucose, UA NEGATIVE  NEGATIVE mg/dL   Hgb urine dipstick NEGATIVE  NEGATIVE   Bilirubin Urine NEGATIVE  NEGATIVE   Ketones, ur NEGATIVE  NEGATIVE mg/dL   Protein, ur NEGATIVE  NEGATIVE mg/dL   Urobilinogen, UA 0.2  0.0 - 1.0 mg/dL   Nitrite NEGATIVE  NEGATIVE   Leukocytes, UA NEGATIVE  NEGATIVE    X-ray: Clinical Data: Right hip pain post fall, injury   RIGHT HIP - COMPLETE 2+ VIEW  Comparison: 11/15/2010   Findings:  Osseous demineralization.  Symmetric hip and SI joints.  Displaced right femoral neck fracture.  No dislocation.  No additional fracture or dislocation identified.  I MPRESSION:  Displaced right femoral neck fracture.  Suspect osteoporosis.   Original Report Authenticated By: Ulyses Southward, M.D.  ROS: Per medical admission, extremity pain, loss of hearing  Blood pressure 127/65, pulse 83, temperature 98.9 F (37.2 C), resp. rate 18, SpO2 95.00%.  Exam: Seen in room last night Right wrist splinted, brisk capillary refill  Right hip pain with movement NVI RLE  General medical exam including reviewing chest exam reviewed  No left upper or lower extremity problems  Assessment/Plan: Right displaced femoral neck fracture Right distal radius fracture  Plan is to go to OR for right hip hemiarthroplasty and to possibly better reduce wrist and place in short  arm splint - I reviewed the wrist films with Dr. Melvyn Novas who will follow up with her in the  office NPO Consent ordered  Nathaneal Sommers D 08/26/2012, 10:20 AM

## 2012-08-26 NOTE — Progress Notes (Signed)
INITIAL NUTRITION ASSESSMENT  DOCUMENTATION CODES Per approved criteria  -Not Applicable   INTERVENTION: 1. Offer Ensure Complete po BID, each supplement provides 350 kcal and 13 grams of protein, once diet advanced.   NUTRITION DIAGNOSIS: Inadequate oral intake related to pain as evidenced by pt/son report.   Goal: Pt to meet >/= 90% of their estimated nutrition needs.   Monitor:  PO intake, supplement acceptance, weight trend  Reason for Assessment: Hip Fx Consult  79 y.o. female  Admitting Dx: Mechanical fall with hip pain   ASSESSMENT: Pt very hard of hearing. Communicated with pt by writing on dry erase board. Per pt she feels that she has lost a couple of pounds recently due to back pain. Pt likes ensure and would like to drink once diet advanced.   Nutrition Focused Physical Exam:  Subcutaneous Fat:  Orbital Region: WNL Upper Arm Region: WNL Thoracic and Lumbar Region: WNL  Muscle:  Temple Region: WNL Clavicle Bone Region: WNL Clavicle and Acromion Bone Region: WNL Scapular Bone Region: WNL Dorsal Hand: WNL Patellar Region: WNL Anterior Thigh Region: WNL Posterior Calf Region: WNL  Edema: not present   Height: Ht Readings from Last 1 Encounters:  07/26/12 5\' 7"  (1.702 m)    Weight: Wt Readings from Last 1 Encounters:  08/20/12 166 lb (75.297 kg)    Ideal Body Weight: 61.3 kg  % Ideal Body Weight: 123%  Wt Readings from Last 10 Encounters:  08/20/12 166 lb (75.297 kg)  08/13/12 168 lb 6.4 oz (76.386 kg)  08/07/12 170 lb (77.111 kg)  07/26/12 170 lb (77.111 kg)  06/24/12 170 lb (77.111 kg)  05/26/12 170 lb (77.111 kg)  05/21/12 170 lb 6.4 oz (77.293 kg)  05/17/12 170 lb (77.111 kg)  05/06/12 171 lb 0.6 oz (77.583 kg)  04/18/12 171 lb (77.565 kg)    Usual Body Weight: 170 lb  % Usual Body Weight: 98%  BMI:  26 - overweight  Estimated Nutritional Needs: Kcal: 1400-1550 Protein: 70-80 grams Fluid: > 1.5 L/day  Skin: no issues  noted  Diet Order: NPO  EDUCATION NEEDS: -No education needs identified at this time   Intake/Output Summary (Last 24 hours) at 08/26/12 1011 Last data filed at 08/26/12 0645  Gross per 24 hour  Intake    150 ml  Output   1000 ml  Net   -850 ml    Last BM: PTA   Labs:   Recent Labs Lab 08/25/12 1130  NA 132*  K 4.3  CL 94*  CO2 26  BUN 26*  CREATININE 0.87  CALCIUM 9.2  GLUCOSE 140*    CBG (last 3)  No results found for this basename: GLUCAP,  in the last 72 hours  Scheduled Meds: . docusate sodium  100 mg Oral BID  . enoxaparin (LOVENOX) injection  40 mg Subcutaneous Q24H  . gabapentin  100 mg Oral QHS  . losartan  50 mg Oral Daily  . metoprolol succinate  100 mg Oral Daily  . pantoprazole  40 mg Oral Daily  . senna  1 tablet Oral BID    Continuous Infusions:   Past Medical History  Diagnosis Date  . History of hiatal hernia   . Mitral valve prolapse   . Plantar fasciitis   . Hay fever   . GERD (gastroesophageal reflux disease)   . Palpitations   . Hypertension   . Neuromuscular disorder   . Deafness     "very HOH; even w/hearing aides we have to  communicate via writing things down for her to read" (08/25/2012)  . H/O hiatal hernia   . Arthritis     "a little all over" (08/25/2012)    Past Surgical History  Procedure Laterality Date  . Foot surgery Bilateral ~ 2000    "pinched nerve on feet" (08/25/2012)  . Vesicovaginal fistula closure w/ tah    . Abdominal hysterectomy  1998  . Tonsillectomy and adenoidectomy  1940's  . Cesarean section  1952  . Knee arthroscopy w/ meniscectomy Left 07/15/2010    partial medial and lateral meniscectomies.Hattie Perch 07/15/2010 (08/25/2012)    Kendell Bane RD, LDN, CNSC [phone] Pager [phone] After Hours Pager

## 2012-08-26 NOTE — Progress Notes (Signed)
Subjective: Admitted with displaced trochanteric hip fracture and wrist fracture. S/p hip surgery this PM. Easily awakened. Just a little confused - to be expected this soon after surgery. Denies any significant pain.  Objective: Lab:  Recent Labs  08/25/12 1130  WBC 10.9*  NEUTROABS 8.6*  HGB 12.9  HCT 37.2  MCV 91.6  PLT 239    Recent Labs  08/25/12 1130  NA 132*  K 4.3  CL 94*  GLUCOSE 140*  BUN 26*  CREATININE 0.87  CALCIUM 9.2    Imaging:  Scheduled Meds: .  ceFAZolin (ANCEF) IV  2 g Intravenous Q6H  . docusate sodium  100 mg Oral BID  . [START ON 08/27/2012] enoxaparin (LOVENOX) injection  40 mg Subcutaneous Q24H  . ferrous sulfate  325 mg Oral TID PC  . gabapentin  100 mg Oral QHS  . losartan  50 mg Oral Daily  . metoprolol succinate  100 mg Oral Daily  . ondansetron      . pantoprazole  40 mg Oral Daily  . senna  1 tablet Oral BID   Continuous Infusions: . lactated ringers 50 mL/hr at 08/26/12 1151  . sodium chloride 0.9 % 1,000 mL with potassium chloride 10 mEq infusion 50 mL/hr at 08/26/12 1750   PRN Meds:.bisacodyl, gabapentin, HYDROcodone-acetaminophen, menthol-cetylpyridinium, methocarbamol (ROBAXIN) IV, methocarbamol, metoCLOPramide (REGLAN) injection, metoCLOPramide, morphine injection, ondansetron, ondansetron (ZOFRAN) IV, ondansetron, phenol, polyethylene glycol, sodium phosphate   Physical Exam: Filed Vitals:   08/26/12 1600  BP: 147/68  Pulse: 72  Temp: 98.3 F (36.8 C)  Resp: 18    Intake/Output Summary (Last 24 hours) at 08/26/12 1916 Last data filed at 08/26/12 1405  Gross per 24 hour  Intake    150 ml  Output   1300 ml  Net  -1150 ml   Cor - RRR Pulm - normal respirations Neuro - HOH, seems to be at her baseline      Assessment/Plan: 1. Ortho - post op #0. Seems comfortable. Op report pending. Plan - per ortho. Will certainly be a candidate for SNF for full recovery given age and limitations  2. HTN -  stable   [facility] (o) [phone]; (c) [phone] Call-grp - [facility]  Tele: [phone]  08/26/2012, 7:15 PM

## 2012-08-26 NOTE — Progress Notes (Signed)
Holding note:  Patient admitted after a fall with fractured wrist and hip. CT chest reviewed. Labs reviewed  Plan 1. Ortho - per Dr. Dion Saucier - cleared for surgery by Dr. Arbutus Leas. Patient was I-ADLS prior to fall   2 Pulmonary - no evidence oncxr of active infection, no fever, WBC normal range.      Mass noted.                      Will see patient this PM post op      Further work up  Lung mass after she has stabilized post-op

## 2012-08-26 NOTE — Preoperative (Signed)
Beta Blockers   Reason not to administer Beta Blockers:Not Applicable 

## 2012-08-26 NOTE — Anesthesia Procedure Notes (Signed)
Procedure Name: Intubation Date/Time: 08/26/2012 1:24 PM Performed by: Armandina Gemma Pre-anesthesia Checklist: Patient identified, Timeout performed, Emergency Drugs available, Suction available and Patient being monitored Patient Re-evaluated:Patient Re-evaluated prior to inductionOxygen Delivery Method: Circle system utilized Preoxygenation: Pre-oxygenation with 100% oxygen Intubation Type: IV induction Ventilation: Mask ventilation without difficulty Laryngoscope Size: Miller and 2 Grade View: Grade III Tube type: Oral Tube size: 6.5 mm Number of attempts: 2 Airway Equipment and Method: Stylet Placement Confirmation: ETT inserted through vocal cords under direct vision,  breath sounds checked- equal and bilateral and positive ETCO2 Secured at: 22 cm Tube secured with: Tape Dental Injury: Teeth and Oropharynx as per pre-operative assessment  Comments: IV induction Massagee- DL X 1 Am CRNA-with 7.5 ETT Esophageal intubation ( anterior only view tip of arytenoids, small chin and very limited neck and jaw mobility- removed- Massagee DL X 1 with 6.5 ETT- + ETCO2 + BS BILAT, - atraumatic on both attempts- teeth and mouth as preop

## 2012-08-26 NOTE — Anesthesia Postprocedure Evaluation (Signed)
Anesthesia Post Note  Patient: [name]  Procedure(s) Performed: Procedure(s) (LRB): ARTHROPLASTY BIPOLAR HIP (Right)  Anesthesia type: General  Patient location: PACU  Post pain: Pain level controlled and Adequate analgesia  Post assessment: Post-op Vital signs reviewed, Patient's Cardiovascular Status Stable, Respiratory Function Stable, Patent Airway and Pain level controlled  Last Vitals:  Filed Vitals:   08/26/12 1530  BP: 157/85  Pulse: 66  Temp:   Resp: 19    Post vital signs: Reviewed and stable  Level of consciousness: awake, alert  and oriented  Complications: No apparent anesthesia complications

## 2012-08-26 NOTE — Transfer of Care (Signed)
Immediate Anesthesia Transfer of Care Note  Patient: [name]  Procedure(s) Performed: Procedure(s): ARTHROPLASTY BIPOLAR HIP (Right)  Patient Location: PACU  Anesthesia Type:General  Level of Consciousness: awake, alert  and oriented  Airway & Oxygen Therapy: Patient Spontanous Breathing and Patient connected to nasal cannula oxygen  Post-op Assessment: Report given to PACU RN and Post -op Vital signs reviewed and stable  Post vital signs: Reviewed and stable  Complications: No apparent anesthesia complications

## 2012-08-27 ENCOUNTER — Encounter (HOSPITAL_COMMUNITY): Payer: Self-pay | Admitting: Orthopedic Surgery

## 2012-08-27 DIAGNOSIS — H9190 Unspecified hearing loss, unspecified ear: Secondary | ICD-10-CM

## 2012-08-27 DIAGNOSIS — E871 Hypo-osmolality and hyponatremia: Secondary | ICD-10-CM

## 2012-08-27 DIAGNOSIS — C3410 Malignant neoplasm of upper lobe, unspecified bronchus or lung: Secondary | ICD-10-CM

## 2012-08-27 DIAGNOSIS — I10 Essential (primary) hypertension: Secondary | ICD-10-CM

## 2012-08-27 LAB — BASIC METABOLIC PANEL
CO2: 22 mEq/L (ref 19–32)
Chloride: 96 mEq/L (ref 96–112)
Creatinine, Ser: 0.66 mg/dL (ref 0.50–1.10)
Potassium: 4 mEq/L (ref 3.5–5.1)
Sodium: 129 mEq/L — ABNORMAL LOW (ref 135–145)

## 2012-08-27 LAB — CBC
MCV: 92.2 fL (ref 78.0–100.0)
Platelets: 167 10*3/uL (ref 150–400)
RBC: 3.35 MIL/uL — ABNORMAL LOW (ref 3.87–5.11)
WBC: 11.5 10*3/uL — ABNORMAL HIGH (ref 4.0–10.5)

## 2012-08-27 MED ORDER — HYDROCODONE-ACETAMINOPHEN 5-325 MG PO TABS: 1.0000 | ORAL_TABLET | Freq: Four times a day (QID) | ORAL | Status: DC | PRN

## 2012-08-27 MED ORDER — ENOXAPARIN SODIUM 40 MG/0.4ML ~~LOC~~ SOLN: 40.0000 mg | SUBCUTANEOUS | Status: DC

## 2012-08-27 NOTE — Evaluation (Signed)
Occupational Therapy Evaluation Patient Details Name: [name] MRN: [id number] DOB: [birth year] Today's Date: 08/27/2012 Time: 4098-1191 OT Time Calculation (min): 34 min  OT Assessment / Plan / Recommendation Clinical Impression    Pt. underwent R bipolar hip for hip fx and has a right distal radius fx. from her son accidentally falling on her. She presents with below problem list and is HOH, needing a dry erase board for communication. She will benefit from  Acute OT to increase independence prior to d/c to next venue. Recommend SNF for d/c.    OT Assessment  Patient needs continued OT Services    Follow Up Recommendations  SNF    Barriers to Discharge      Equipment Recommendations  Other (comment) (defer to SNF)    Recommendations for Other Services    Frequency  Min 2X/week    Precautions / Restrictions Precautions Precautions: Fall Restrictions Weight Bearing Restrictions: Yes RUE Weight Bearing: Non weight bearing (NWB through wrist) RLE Weight Bearing: Weight bearing as tolerated   Pertinent Vitals/Pain Denied pain at beginning of session. Pain with movement. Repositioned.     ADL  Eating/Feeding: Independent Where Assessed - Eating/Feeding: Chair Grooming: Minimal assistance Where Assessed - Grooming: Supported sitting Upper Body Bathing: Moderate assistance Where Assessed - Upper Body Bathing: Supported sitting Lower Body Bathing: +2 Total assistance Lower Body Bathing: Patient Percentage: 10% Where Assessed - Lower Body Bathing: Supported sit to stand Upper Body Dressing: Moderate assistance Where Assessed - Upper Body Dressing: Supported sitting Lower Body Dressing: +2 Total assistance Lower Body Dressing: Patient Percentage: 10% Where Assessed - Lower Body Dressing: Supported sit to stand Toilet Transfer: Simulated;+2 Total assistance Toilet Transfer: Patient Percentage: 40% Statistician Method: Surveyor, minerals: Other  (comment) (from bed to recliner chair) Tub/Shower Transfer Method: Not assessed Equipment Used: Gait belt Transfers/Ambulation Related to ADLs: +2 total A ( 40%) for transfers. ADL Comments: Pt limited today due to pain. Transferred from bed to chair. Used a dry erase board to communicate. Pt overall +2 total A for LB ADLs and Mod A for UB ADLs.    OT Diagnosis: Acute pain  OT Problem List: Decreased strength;Decreased activity tolerance;Impaired balance (sitting and/or standing);Decreased knowledge of use of DME or AE;Decreased knowledge of precautions;Impaired UE functional use;Pain;Decreased safety awareness OT Treatment Interventions: Self-care/ADL training;DME and/or AE instruction;Therapeutic activities;Patient/family education;Balance training   OT Goals Acute Rehab OT Goals OT Goal Formulation: With patient/family Time For Goal Achievement: 09/03/12 Potential to Achieve Goals: Good ADL Goals Pt Will Perform Grooming: Standing at sink;Supported;with min assist ADL Goal: Grooming - Progress: Goal set today Pt Will Perform Upper Body Bathing: with supervision;Sitting, chair ADL Goal: Upper Body Bathing - Progress: Goal set today Pt Will Perform Lower Body Bathing: with min assist;Sit to stand from chair ADL Goal: Lower Body Bathing - Progress: Goal set today Pt Will Perform Upper Body Dressing: with supervision;Sitting, chair;Sitting, bed ADL Goal: Upper Body Dressing - Progress: Goal set today Pt Will Perform Lower Body Dressing: with min assist;Sit to stand from bed;Sit to stand from chair ADL Goal: Lower Body Dressing - Progress: Goal set today Pt Will Transfer to Toilet: with min assist;Ambulation;with DME ADL Goal: Toilet Transfer - Progress: Goal set today Pt Will Perform Toileting - Clothing Manipulation: with min assist;Standing ADL Goal: Toileting - Clothing Manipulation - Progress: Goal set today Pt Will Perform Toileting - Hygiene: with min assist;Sit to stand from  3-in-1/toilet;Sitting on 3-in-1 or toilet ADL Goal: Toileting - Hygiene -  Progress: Goal set today Miscellaneous OT Goals Miscellaneous OT Goal #1: Pt will perform bed mobility with Min A as precursor for EOB ADL retraining. OT Goal: Miscellaneous Goal #1 - Progress: Goal set today  Visit Information  Last OT Received On: 08/27/12 Assistance Needed: +2 PT/OT Co-Evaluation/Treatment: Yes    Subjective Data      Prior Functioning     Home Living Lives With: Son Available Help at Discharge: Skilled Nursing Facility Prior Function Level of Independence: Independent with assistive device(s);Needs assistance (independent with cane pta, assist for heavy housekeeping) Needs Assistance:  (help for heavy housekeeping and shoppping) Able to Take Stairs?: Yes Driving: Yes Vocation: Retired Comments: Daughter, Titus Dubin, states pt.s son lives with pt. and he accidently fell and knocked pt. down.  She reports he has bipolar disorder and is currently manic.  She wants staff to know this because he will be up here some with his mom. Communication Communication: HOH;Other (comment) (use dry erase board to communicate directions) Dominant Hand: Right         Vision/Perception     Cognition  Cognition Arousal/Alertness: Awake/alert Behavior During Therapy: WFL for tasks assessed/performed Overall Cognitive Status: Within Functional Limits for tasks assessed    Extremity/Trunk Assessment Right Upper Extremity Assessment RUE ROM/Strength/Tone: Unable to fully assess;Due to precautions Left Upper Extremity Assessment LUE ROM/Strength/Tone: [facility] for tasks assessed     Mobility Bed Mobility Bed Mobility: Supine to Sit;Sitting - Scoot to Edge of Bed;Sit to Supine Supine to Sit: 1: +2 Total assist Supine to Sit: Patient Percentage: 10% Sitting - Scoot to Edge of Bed: 1: +2 Total assist Sitting - Scoot to Edge of Bed: Patient Percentage: 10% Sit to Supine: Not Tested (comment) Sit to Supine:  Patient Percentage: 10% Details for Bed Mobility Assistance: written directions given to pt. on dry erase board prior to tasks.  She is fearful even with written directions and needs 2 assist and use of bed pad for transition Transfers Transfers: Sit to Stand;Stand to Sit Sit to Stand: 1: +2 Total assist;From bed Sit to Stand: Patient Percentage: 40% Stand to Sit: 1: +2 Total assist;To chair/3-in-1 Stand to Sit: Patient Percentage: 40% Details for Transfer Assistance: written directions provided prior to transfer.  Pt. needs full assist of 2 but able to initiate sit to stand and pivoting.     Exercise     Balance     End of Session OT - End of Session Equipment Utilized During Treatment: Gait belt;Other (comment) (shoulder sling) Activity Tolerance: Patient limited by pain Patient left: in chair;with family/visitor present  GO     Earlie Raveling OTR/L [phone] 08/27/2012, 3:38 PM

## 2012-08-27 NOTE — Progress Notes (Signed)
   Subjective: 1 Day Post-Op Procedure(s) (LRB): ARTHROPLASTY BIPOLAR HIP (Right)   Patient reports pain as mild, pain well controlled. No events throughout the night. No changes in condition.  Objective:   VITALS:   Filed Vitals:   08/27/12 1300  BP: 153/68  Pulse: 97  Temp: 98.5 F (36.9 C)  Resp: 16    Neurovascular intact Dorsiflexion/Plantar flexion intact Incision: dressing C/D/I No cellulitis present Compartment soft  LABS  Recent Labs  08/25/12 1130 08/27/12 0425  HGB 12.9 10.6*  HCT 37.2 30.9*  WBC 10.9* 11.5*  PLT 239 167     Recent Labs  08/25/12 1130 08/27/12 0425  NA 132* 129*  K 4.3 4.0  BUN 26* 13  CREATININE 0.87 0.66  GLUCOSE 140* 131*     Assessment/Plan: 1 Day Post-Op Procedure(s) (LRB): ARTHROPLASTY BIPOLAR HIP (Right) Up with therapy Discharge to SNF eventually, when medically ready Orthopaedically stable 50% WB right leg with a walker that has a arm rest. Norco for pain, Rx written Lovenox for anticoagulation, Rx written Follow up with Dr. Charlann Boxer in 2 weeks, regarding right hip fracture. Follow up with Dr, Melvyn Novas in 1 weeks, regarding right wrist fracture.  Contact information:  [facility] [address] [place] [address] [phone]        Anastasio Auerbach. Alecsander Hattabaugh   PAC  08/27/2012, 4:47 PM

## 2012-08-27 NOTE — Progress Notes (Signed)
Patient ID: [name], female   DOB: 1916-02-13, 79 y.o.   MRN: [id number]   Request rec'd for L apical lung mass biopsy  Reviewed CT scan with Dr Deanne Coffer Rec: PET  Dr Debby Bud informed and agreeable He will order PET  Plan for possible bx next week

## 2012-08-27 NOTE — Progress Notes (Signed)
PT PROGRESS 08/27/12 1356  PT Visit Information  Last PT Received On 08/27/12  Assistance Needed +2  PT Time Calculation  PT Start Time 1312  PT Stop Time 1323  PT Time Calculation (min) 11 min  Precautions  Precautions Fall  Restrictions  Weight Bearing Restrictions Yes  RUE Weight Bearing NWB  RLE Weight Bearing WBAT  Cognition  Arousal/Alertness Awake/alert  Behavior During Therapy WFL for tasks assessed/performed  Overall Cognitive Status Within Functional Limits for tasks assessed  Bed Mobility  Bed Mobility Sit to Supine  Sit to Supine 1: +2 Total assist  Sit to Supine: Patient Percentage 10%  Details for Bed Mobility Assistance assist or 2  to manage upper and lower body  Transfers  Transfers Sit to Stand;Stand to Sit;Stand Pivot Transfers  Sit to Stand 1: +2 Total assist;From chair/3-in-1  Sit to Stand: Patient Percentage 40%  Stand to Sit 1: +2 Total assist;To bed  Stand to Sit: Patient Percentage 40%  Stand Pivot Transfers 1: +2 Total assist  Stand Pivot Transfers: Patient Percentage 40%  Details for Transfer Assistance pt. more familiar with process and needed only one note on dry erase board to prepare her to complete transfer  Ambulation/Gait  Ambulation/Gait Assistance Not tested (comment)  PT - End of Session  Equipment Utilized During Treatment Gait belt  Activity Tolerance Patient limited by pain;Patient limited by fatigue;Other (comment)  Patient left in bed;with call bell/phone within reach;with bed alarm set;with family/visitor present  Nurse Communication Mobility status;Need for lift equipment;Weight bearing status  PT - Assessment/Plan  Comments on Treatment Session Pt. becoming more familiar with process of transfers and was not as anxious, however still needs 2 assist .  PT Plan Discharge plan remains appropriate;Frequency remains appropriate  PT Frequency Min 6X/week  Follow Up Recommendations SNF;Supervision/Assistance - 24 hour;Supervision for  mobility/OOB  PT equipment None recommended by PT  Acute Rehab PT Goals  Pt will go Sit to Supine/Side with min assist  PT Goal: Sit to Supine/Side - Progress Progressing toward goal  Pt will Transfer Bed to Chair/Chair to Bed with min assist  PT Transfer Goal: Bed to Chair/Chair to Bed - Progress Progressing toward goal  PT General Charges  $$ ACUTE PT VISIT 1 Procedure  PT Treatments  $Therapeutic Activity 8-22 mins  Weldon Picking PT Acute Rehab Services [phone] Bee[place] [phone]

## 2012-08-27 NOTE — Progress Notes (Signed)
Subjective: Easily awakened. IN no pain. Oriented. Understands her situation  Objective: Lab:  Recent Labs  08/25/12 1130 08/27/12 0425  WBC 10.9* 11.5*  NEUTROABS 8.6*  --   HGB 12.9 10.6*  HCT 37.2 30.9*  MCV 91.6 92.2  PLT 239 167    Recent Labs  08/25/12 1130 08/27/12 0425  NA 132* 129*  K 4.3 4.0  CL 94* 96  GLUCOSE 140* 131*  BUN 26* 13  CREATININE 0.87 0.66  CALCIUM 9.2 8.4    Imaging: CT chest 5/28: IMPRESSION:  1. Left apical lung mass. Differential includes bronchogenic  carcinoma versus focus of pulmonary infection. Consider  percutaneous biopsy versus antibiotic therapy and re-imaging if the  patient has clinical findings consistent with pulmonary infection.  2. No evidence of mediastinal nodal metastasis.  3. Enlarged pulmonary arteries consistent with pulmonary  hypertension.  Scheduled Meds: . docusate sodium  100 mg Oral BID  . enoxaparin (LOVENOX) injection  40 mg Subcutaneous Q24H  . ferrous sulfate  325 mg Oral TID PC  . gabapentin  100 mg Oral QHS  . losartan  50 mg Oral Daily  . metoprolol succinate  100 mg Oral Daily  . pantoprazole  40 mg Oral Daily  . senna  1 tablet Oral BID   Continuous Infusions: . lactated ringers 50 mL/hr at 08/26/12 1151  . sodium chloride 0.9 % 1,000 mL with potassium chloride 10 mEq infusion 50 mL/hr at 08/26/12 1750   PRN Meds:.bisacodyl, gabapentin, HYDROcodone-acetaminophen, menthol-cetylpyridinium, methocarbamol (ROBAXIN) IV, methocarbamol, metoCLOPramide (REGLAN) injection, metoCLOPramide, morphine injection, ondansetron, ondansetron (ZOFRAN) IV, ondansetron, phenol, polyethylene glycol   Physical Exam: Filed Vitals:   08/27/12 0800  BP: 139/51  Pulse:   Temp: 98.7  Resp: 16   gen'l - heavyset elderly woman in no distress HEENT- C&S clear Cor- 2+ radial, RRR Pulm - normal respirations, no rales or wheezes Abd- soft, BS + Ext - - right wrist in brace/dressing; right hip dressing looks  ok     Assessment/Plan: 1. Ortho - POD #1 right hip repair. Mild drop in Hgb. Plan  Per Ortho re: PT/OT  Will need Rehab/SNF  2. HTN - adequately controlled.  3. F/E/N - mild hyponatremia Plan - gentle hydration with NS  F/u Bmet in AM  4. ID - prior to hospitalization was treated with antibiotic and diflucan. No evidence of respiratory infection at this time  5. Pul - left apical mass. She does not have an apparent infection. She is a non-smoker. Plan IR consult to set up percuatenous biopsy next week.    [facility] (o) [phone]; (c) [phone] Call-grp - [facility]  Tele: [phone]  08/27/2012, 9:43 AM
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
## 2012-08-27 NOTE — Clinical Social Work Psychosocial (Signed)
Clinical Social Work Department BRIEF PSYCHOSOCIAL ASSESSMENT 08/27/2012  Patient:  [name], [name]     Account Number:  [id number]     Admit date:  08/25/2012  Clinical Social Worker:  Read Drivers  Date/Time:  08/27/2012 02:55 PM  Referred by:  Physician  Date Referred:  08/27/2012 Referred for  SNF Placement   Other Referral:   none   Interview type:  Other - See comment Other interview type:   Daughter Venita Sheffield and son [name]    PSYCHOSOCIAL DATA Living Status:  ALONE Admitted from facility:   Level of care:   Primary support name:  Gladys Primary support relationship to patient:  CHILD, ADULT Degree of support available:   adequate    CURRENT CONCERNS Current Concerns  Post-Acute Placement   Other Concerns:   none    SOCIAL WORK ASSESSMENT / PLAN CSW spoke to daughter, Venita Sheffield and son, [name] re: pt post acute placement.  Pt will need SNF per PT/OT recommendation.  Pt and family is agreeable to SNF search. Pt family is unaware of SNF procedures or facilities. Family will be provided a SNF list for reference.  CSW requested family relay a first choice and a second choice to CSW by Monday to start preparing for d/c.  Per RN, pt is scheduled to have tests run here at [facility] on Monday and will not d/c until results are in.   Assessment/plan status:  Information/Referral to [facility] Other assessment/ plan:   none   Information/referral to community resources:   SNF    PATIENT'S/FAMILY'S RESPONSE TO PLAN OF CARE: Pt and family was receptive and compliant with PT/OT recommendations of SNF prior to pt going home.  Pt and family was appreciative of CSW support and assistance.       Vickii Penna, LCSWA [phone]  Clinical Social Work

## 2012-08-28 DIAGNOSIS — N952 Postmenopausal atrophic vaginitis: Secondary | ICD-10-CM

## 2012-08-28 LAB — BASIC METABOLIC PANEL
Chloride: 99 mEq/L (ref 96–112)
Creatinine, Ser: 0.8 mg/dL (ref 0.50–1.10)
GFR calc Af Amer: 71 mL/min — ABNORMAL LOW (ref >90)
Potassium: 3.9 mEq/L (ref 3.5–5.1)
Sodium: 132 mEq/L — ABNORMAL LOW (ref 135–145)

## 2012-08-28 LAB — CBC
Platelets: 146 10*3/uL — ABNORMAL LOW (ref 150–400)
RDW: 14 % (ref 11.5–15.5)
WBC: 11.5 10*3/uL — ABNORMAL HIGH (ref 4.0–10.5)

## 2012-08-28 MED ORDER — ESTRADIOL 0.1 MG/GM VA CREA
1.0000 | TOPICAL_CREAM | Freq: Every day | VAGINAL | Status: DC
  Administered 2012-08-28 – 2012-08-29 (×2): 1 via VAGINAL
  Filled 2012-08-28: qty 42.5

## 2012-08-28 MED ORDER — TAMSULOSIN HCL 0.4 MG PO CAPS
0.4000 mg | ORAL_CAPSULE | Freq: Every day | ORAL | Status: DC
  Administered 2012-08-28 – 2012-08-30 (×3): 0.4 mg via ORAL
  Filled 2012-08-28 (×3): qty 1
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
## 2012-08-28 NOTE — Progress Notes (Signed)
Orthopedic Tech Progress Note Patient Details:  [name] April 08, 1915 [id number] Patient family refused ohf. Ortho Devices Type of Ortho Device: Sugartong splint Ortho Device/Splint Interventions: Application   Jennye Moccasin 08/28/2012, 4:18 PM

## 2012-08-28 NOTE — Care Management Note (Signed)
CARE MANAGEMENT NOTE 08/28/2012  Patient:  [name], [name]   Account Number:  [id number]  Date Initiated:  08/28/2012  Documentation initiated by:  Vance Peper  Subjective/Objective Assessment:   79 yr old female s/p right hip hemiarthropasty. Patient lives home with brother as care provider.     Action/Plan:   Patient is for shortterm rehab at [facility].Social worker is aware.   Anticipated DC Date:  08/30/2012   Anticipated DC Plan:  SKILLED NURSING FACILITY  In-house referral  Clinical Social Worker      DC Planning Services  CM consult      Choice offered to / List presented to:             Status of service:  Completed, signed off Medicare Important Message given?   (If response is "NO", the following Medicare IM given date fields will be blank) Date Medicare IM given:   Date Additional Medicare IM given:    Discharge Disposition:  SKILLED NURSING FACILITY  Per UR Regulation:    If discussed at Long Length of Stay Meetings, dates discussed:    Comments:

## 2012-08-28 NOTE — Progress Notes (Addendum)
CSW met with patient & daughter, [name] (h#: [phone] c#: [phone]) re: discharge planning. CSW provided list of facilities for [place] counties as daughter requested either [facility] in [place] or "a facility closer to [employer] CSW will follow-up with bed offers when available.   Clinical Social Work Department CLINICAL SOCIAL WORK PLACEMENT NOTE 08/28/2012  Patient:  [name], [name]  Account Number:  [id number] Admit date:  08/25/2012  Clinical Social Worker:  Orpah Greek  Date/time:  08/28/2012 03:07 PM  Clinical Social Work is seeking post-discharge placement for this patient at the following level of care:   SKILLED NURSING   (*CSW will update this form in Epic as items are completed)   08/28/2012  Patient/family provided with [facility] Department of Clinical Social Work's list of facilities offering this level of care within the geographic area requested by the patient (or if unable, by the patient's family).  08/28/2012  Patient/family informed of their freedom to choose among providers that offer the needed level of care, that participate in Medicare, Medicaid or managed care program needed by the patient, have an available bed and are willing to accept the patient.  08/28/2012  Patient/family informed of [facility]' ownership interest in [facility], as well as of the fact that they are under no obligation to receive care at this facility.  PASARR submitted to EDS on 08/28/2012 PASARR number received from EDS on 08/28/2012  FL2 transmitted to all facilities in geographic area requested by pt/family on  08/28/2012 FL2 transmitted to all facilities within larger geographic area on   Patient informed that his/her managed care company has contracts with or will negotiate with  certain facilities, including the following:     Patient/family informed of bed offers received:  08/30/2012 Patient chooses bed at [facility] Physician  recommends and patient chooses bed at    Patient to be transferred to  on  08/30/2012 Patient to be transferred to facility by [facility]  The following physician request were entered in Epic:   Additional Comments:   Unice Bailey, LCSW [facility] Clinical Social Worker cell #: [phone] (weekend coverage)

## 2012-08-28 NOTE — Progress Notes (Signed)
Physical Therapy Treatment Patient Details Name: [name] MRN: [id number] DOB: 09-10-1915 Today's Date: 08/28/2012 Time: 5284-1324 PT Time Calculation (min): 26 min  PT Assessment / Plan / Recommendation Comments on Treatment Session  Patient able to perform a little better today with transfer. Needed a little less assistance today and appeared at more ease with transfer    Follow Up Recommendations  SNF;Supervision/Assistance - 24 hour;Supervision for mobility/OOB     Does the patient have the potential to tolerate intense rehabilitation     Barriers to Discharge        Equipment Recommendations       Recommendations for Other Services    Frequency Min 6X/week   Plan Discharge plan remains appropriate;Frequency remains appropriate    Precautions / Restrictions Precautions Precautions: Fall Restrictions RUE Weight Bearing: Weight bear through elbow only RLE Weight Bearing: Weight bearing as tolerated   Pertinent Vitals/Pain     Mobility  Bed Mobility Supine to Sit: 1: +2 Total assist Supine to Sit: Patient Percentage: 10% Sitting - Scoot to Edge of Bed: Patient Percentage: 10% Details for Bed Mobility Assistance: written directions given to pt. on dry erase board prior to tasks.  Able to transistion with pad and A for trunk support and LEs Transfers Sit to Stand: 1: +2 Total assist;From bed;From chair/3-in-1 Sit to Stand: Patient Percentage: 50% Stand to Sit: 1: +2 Total assist;To chair/3-in-1 Stand to Sit: Patient Percentage: 50% Stand Pivot Transfers: 1: +2 Total assist Stand Pivot Transfers: Patient Percentage: 50% Details for Transfer Assistance: written directions provided prior to transfer.  Pt. needs full assist of 2 but able to initiate sit to stand and pivoting.    Exercises     PT Diagnosis:    PT Problem List:   PT Treatment Interventions:     PT Goals Acute Rehab PT Goals PT Goal: Supine/Side to Sit - Progress: Progressing toward goal PT  Goal: Sit to Stand - Progress: Progressing toward goal PT Goal: Stand to Sit - Progress: Progressing toward goal PT Transfer Goal: Bed to Chair/Chair to Bed - Progress: Progressing toward goal  Visit Information  Last PT Received On: 08/28/12    Subjective Data      Cognition  Cognition Arousal/Alertness: Awake/alert Behavior During Therapy: WFL for tasks assessed/performed Overall Cognitive Status: Within Functional Limits for tasks assessed    Balance  Static Sitting Balance Static Sitting - Balance Support: Left upper extremity supported;Feet supported Static Sitting - Level of Assistance: 4: Min Oncologist Standing - Balance Support: Left upper extremity supported Static Standing - Level of Assistance: 1: +2 Total assist Static Standing - Comment/# of Minutes: 3 mins with pericare  End of Session PT - End of Session Equipment Utilized During Treatment: Gait belt Activity Tolerance: Patient limited by fatigue Patient left: with call bell/phone within reach;with bed alarm set;with family/visitor present;in chair Nurse Communication: Mobility status;Need for lift equipment;Weight bearing status   GP     Fredrich Birks 08/28/2012, 10:18 AM 08/28/2012 Fredrich Birks PTA [phone] pager [phone] office

## 2012-08-28 NOTE — Progress Notes (Signed)
Subjective: [name] is up in a chair and she is complaining of hip and wrist pain. She also has discomfort from atrophic vaginitis.  Objective: Lab:  Recent Labs  08/25/12 1130 08/27/12 0425 08/28/12 0615  WBC 10.9* 11.5* 11.5*  NEUTROABS 8.6*  --   --   HGB 12.9 10.6* 9.8*  HCT 37.2 30.9* 28.5*  MCV 91.6 92.2 91.1  PLT 239 167 146*    Recent Labs  08/25/12 1130 08/27/12 0425 08/28/12 0615  NA 132* 129* 132*  K 4.3 4.0 3.9  CL 94* 96 99  GLUCOSE 140* 131* 143*  BUN 26* 13 20  CREATININE 0.87 0.66 0.80  CALCIUM 9.2 8.4 8.1*    Imaging:  Scheduled Meds: . docusate sodium  100 mg Oral BID  . enoxaparin (LOVENOX) injection  40 mg Subcutaneous Q24H  . ferrous sulfate  325 mg Oral TID PC  . gabapentin  100 mg Oral QHS  . losartan  50 mg Oral Daily  . metoprolol succinate  100 mg Oral Daily  . pantoprazole  40 mg Oral Daily  . senna  1 tablet Oral BID   Continuous Infusions: . lactated ringers 50 mL/hr at 08/26/12 1151  . sodium chloride 0.9 % 1,000 mL with potassium chloride 10 mEq infusion 75 mL/hr at 08/28/12 0846   PRN Meds:.bisacodyl, gabapentin, HYDROcodone-acetaminophen, menthol-cetylpyridinium, methocarbamol (ROBAXIN) IV, methocarbamol, metoCLOPramide (REGLAN) injection, metoCLOPramide, morphine injection, ondansetron, ondansetron (ZOFRAN) IV, ondansetron, phenol, polyethylene glycol   Physical Exam: Filed Vitals:   08/28/12 0530  BP: 120/55  Pulse: 90  Temp: 98.9 F (37.2 C)  Resp: 16    Intake/Output Summary (Last 24 hours) at 08/28/12 1022 Last data filed at 08/28/12 0800  Gross per 24 hour  Intake   1620 ml  Output    950 ml  Net    670 ml   Gen'l - Elderly but well preserved white woman in no distress but in pain Cor- 2+ radial pulse left, RRR Pulm - normal respirations Neuro - at her usual level of alertness.      Assessment/Plan: 1. Ortho - POD #2. Deemed stable and ready for SNF by ortho. PLan Transfer to SNF when bed  available.  2. HTN - good control  3. F/E/N - sodium is up today.  4. ID- no evidence of infection. She is uncomfortable due to atrophic vaginitis  5. Pulm - discussed issue with patient. Advised that at age 79, in the absence of signs or symptoms of malignancy, with no surgical option it is best to be very thoughtful: will proceed with rehab and when she has made more of a recovery we can arrange as an outpatient for  repeat CT, PET scan and biopsy if indicated if after a period of reflection she wishes to proceed and is willing to accept treatment if there is a malignancy. Will discuss further in AM.   Illene Regulus [place] [facility] (o) [phone]; (c) [phone] Call-grp - [facility]  Tele: [phone]  08/28/2012, 10:05 AM

## 2012-08-29 LAB — CBC
HCT: 27.5 % — ABNORMAL LOW (ref 36.0–46.0)
Hemoglobin: 9.4 g/dL — ABNORMAL LOW (ref 12.0–15.0)
Hemoglobin: 9.7 g/dL — ABNORMAL LOW (ref 12.0–15.0)
MCH: 31.6 pg (ref 26.0–34.0)
RBC: 3.02 MIL/uL — ABNORMAL LOW (ref 3.87–5.11)
RBC: 3.07 MIL/uL — ABNORMAL LOW (ref 3.87–5.11)
RDW: 14.2 % (ref 11.5–15.5)
WBC: 8.6 10*3/uL (ref 4.0–10.5)

## 2012-08-29 LAB — BASIC METABOLIC PANEL
CO2: 23 mEq/L (ref 19–32)
Calcium: 8.3 mg/dL — ABNORMAL LOW (ref 8.4–10.5)
Glucose, Bld: 121 mg/dL — ABNORMAL HIGH (ref 70–99)
Sodium: 129 mEq/L — ABNORMAL LOW (ref 135–145)

## 2012-08-29 NOTE — Progress Notes (Signed)
Subjective: Continued c/o pelvic/vaginal discomfort and difficulty with micturition. Ortho related pain is controlled  Objective: Lab:  Recent Labs  08/27/12 0425 08/28/12 0615 08/29/12 0511  WBC 11.5* 11.5* 8.6  HGB 10.6* 9.8* 9.4*  HCT 30.9* 28.5* 27.5*  MCV 92.2 91.1 91.1  PLT 167 146* 166    Recent Labs  08/27/12 0425 08/28/12 0615  NA 129* 132*  K 4.0 3.9  CL 96 99  GLUCOSE 131* 143*  BUN 13 20  CREATININE 0.66 0.80  CALCIUM 8.4 8.1*    Imaging:  Scheduled Meds: . docusate sodium  100 mg Oral BID  . enoxaparin (LOVENOX) injection  40 mg Subcutaneous Q24H  . estradiol  1 Applicatorful Vaginal QHS  . ferrous sulfate  325 mg Oral TID PC  . gabapentin  100 mg Oral QHS  . losartan  50 mg Oral Daily  . metoprolol succinate  100 mg Oral Daily  . pantoprazole  40 mg Oral Daily  . senna  1 tablet Oral BID  . tamsulosin  0.4 mg Oral Daily   Continuous Infusions: . lactated ringers 50 mL/hr at 08/26/12 1151  . sodium chloride 0.9 % 1,000 mL with potassium chloride 10 mEq infusion 75 mL/hr at 08/29/12 0001   PRN Meds:.bisacodyl, gabapentin, HYDROcodone-acetaminophen, menthol-cetylpyridinium, methocarbamol (ROBAXIN) IV, methocarbamol, metoCLOPramide (REGLAN) injection, metoCLOPramide, morphine injection, ondansetron, ondansetron (ZOFRAN) IV, ondansetron, phenol, polyethylene glycol   Physical Exam: Filed Vitals:   08/29/12 0640  BP: 126/80  Pulse: 98  Temp: 98.4 F (36.9 C)  Resp: 18   gen'l - elderly white woman in no acute distress Cor- RRR PUlm - normal respirations. MSK - right UE in wrap/brace, right hip wound dry. Neuro - HOH, at her baseline.       Assessment/Plan: 1. Ortho - POD #3 - stable and ready for SNF  2. HTN - adequate control  3. F/E/N - for BMET   4. ID- no evidence of infection  5. Pulm - discussed plan with patient and her daughter: being very thoughtful - daughter states that her mother is not interested in chemotherapy -  and if work up to happen will delay CT/PET for several weeks until she has completed rehab.   Dispo - for transfer to SNF when bed available.    Illene Regulus [place] [facility] (o) [phone]; (c) [phone] Call-grp - [facility]  Tele: [phone]  08/29/2012, 9:59 AM

## 2012-08-30 ENCOUNTER — Telehealth: Payer: Self-pay

## 2012-08-30 ENCOUNTER — Other Ambulatory Visit: Payer: Self-pay | Admitting: Internal Medicine

## 2012-08-30 DIAGNOSIS — R300 Dysuria: Secondary | ICD-10-CM

## 2012-08-30 DIAGNOSIS — E785 Hyperlipidemia, unspecified: Secondary | ICD-10-CM

## 2012-08-30 MED ORDER — FERROUS SULFATE 325 (65 FE) MG PO TABS: 325.0000 mg | ORAL_TABLET | Freq: Three times a day (TID) | ORAL | Status: DC

## 2012-08-30 MED ORDER — BISACODYL 10 MG RE SUPP: 10.0000 mg | Freq: Every day | RECTAL | Status: DC | PRN

## 2012-08-30 MED ORDER — OMEPRAZOLE 20 MG PO CPDR: 20.0000 mg | DELAYED_RELEASE_CAPSULE | Freq: Every day | ORAL | Status: DC

## 2012-08-30 MED ORDER — MENTHOL 3 MG MT LOZG: 1.0000 | LOZENGE | OROMUCOSAL | Status: DC | PRN

## 2012-08-30 MED ORDER — GABAPENTIN 100 MG PO CAPS: 100.0000 mg | ORAL_CAPSULE | Freq: Two times a day (BID) | ORAL | Status: DC | PRN

## 2012-08-30 MED ORDER — SENNA 8.6 MG PO TABS: 1.0000 | ORAL_TABLET | Freq: Two times a day (BID) | ORAL | Status: DC

## 2012-08-30 MED ORDER — TAMSULOSIN HCL 0.4 MG PO CAPS: 0.4000 mg | ORAL_CAPSULE | Freq: Every day | ORAL | Status: DC

## 2012-08-30 MED ORDER — METHOCARBAMOL 500 MG PO TABS: 500.0000 mg | ORAL_TABLET | Freq: Four times a day (QID) | ORAL | Status: DC | PRN

## 2012-08-30 NOTE — Progress Notes (Signed)
Physical Therapy Treatment Patient Details Name: [name] MRN: [id number] DOB: May 19, 1915 Today's Date: 08/30/2012 Time: 4098-1191 PT Time Calculation (min): 16 min  PT Assessment / Plan / Recommendation Comments on Treatment Session  Pt refused to attempt amb or transfer today due to constipation pain. Was agreeable to exercises. Pt planning to D/C to SNF today for cont rehab. Will cont to f/u with pt while in acute setting to maximzie functional mobility.    Follow Up Recommendations  SNF;Supervision/Assistance - 24 hour;Supervision for mobility/OOB     Does the patient have the potential to tolerate intense rehabilitation     Barriers to Discharge        Equipment Recommendations  None recommended by PT    Recommendations for Other Services    Frequency Min 6X/week   Plan Discharge plan remains appropriate;Frequency remains appropriate    Precautions / Restrictions Precautions Precautions: Fall Restrictions Weight Bearing Restrictions: Yes RUE Weight Bearing: Weight bear through elbow only RLE Weight Bearing: Weight bearing as tolerated   Pertinent Vitals/Pain C/o pain due to constipation and reports she has not had BM since last Tuesday.    Mobility  Bed Mobility Bed Mobility: Not assessed Transfers Transfers: Not assessed Ambulation/Gait Ambulation/Gait Assistance: Not tested (comment) Stairs: No Wheelchair Mobility Wheelchair Mobility: No    Exercises General Exercises - Lower Extremity Ankle Circles/Pumps: AROM;10 reps;Supine Short Arc Quad: AROM;AAROM;Both;10 reps;Supine;Other (comment) (AAROM R LE due to decreased mm activation) Heel Slides: AROM;AAROM;Both;10 reps;Supine;Other (comment) (AAROm in R LE ) Hip ABduction/ADduction: AROM;AAROM;10 reps;Supine Straight Leg Raises: AAROM;Strengthening;AROM;10 reps;Supine   PT Diagnosis:    PT Problem List:   PT Treatment Interventions:     PT Goals Acute Rehab PT Goals PT Goal Formulation: With  patient/family Time For Goal Achievement: 09/03/12 Potential to Achieve Goals: Fair  Visit Information  Last PT Received On: 08/30/12 Assistance Needed: +2    Subjective Data  Subjective: pt reports that she is in too much pain to get up today; agreeable to therapy  Patient Stated Goal: rehab today   Cognition  Cognition Arousal/Alertness: Awake/alert Behavior During Therapy: WFL for tasks assessed/performed Overall Cognitive Status: Within Functional Limits for tasks assessed    Balance  Balance Balance Assessed: No  End of Session PT - End of Session Activity Tolerance: Patient limited by pain Patient left: with call bell/phone within reach;with family/visitor present Nurse Communication: Mobility status   GP     Donell Sievert, [place] [phone] 08/30/2012, 12:30 PM

## 2012-08-30 NOTE — Telephone Encounter (Signed)
Phone call from Sabino Niemann [phone] at [facility]. Patient was discharged today and you forgot to sign her Hydrocodone scripts.
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
## 2012-08-30 NOTE — Progress Notes (Signed)
Clinical social worker assisted with patient discharge to skilled nursing facility, [facility] .  CSW addressed all family questions and concerns. CSW copied chart and added all important documents. CSW also set up patient transportation with [facility]. Clinical Social Worker will sign off for now as social work intervention is no longer needed.   Katey Barrie, MSW [phone] 

## 2012-08-30 NOTE — Progress Notes (Signed)
Subjective: [name] reports continued difficulty with urination - foley placed last PM due to retention. She c/o back pain.  Objective: Lab:  Recent Labs  08/28/12 0615 08/29/12 0511 08/29/12 2153  WBC 11.5* 8.6 8.7  HGB 9.8* 9.4* 9.7*  HCT 28.5* 27.5* 27.8*  MCV 91.1 91.1 90.6  PLT 146* 166 191    Recent Labs  08/28/12 0615 08/29/12 2153  NA 132* 129*  K 3.9 4.3  CL 99 97  GLUCOSE 143* 121*  BUN 20 17  CREATININE 0.80 0.67  CALCIUM 8.1* 8.3*    Imaging:  Scheduled Meds: . docusate sodium  100 mg Oral BID  . enoxaparin (LOVENOX) injection  40 mg Subcutaneous Q24H  . estradiol  1 Applicatorful Vaginal QHS  . ferrous sulfate  325 mg Oral TID PC  . gabapentin  100 mg Oral QHS  . losartan  50 mg Oral Daily  . metoprolol succinate  100 mg Oral Daily  . pantoprazole  40 mg Oral Daily  . senna  1 tablet Oral BID  . tamsulosin  0.4 mg Oral Daily   Continuous Infusions: . lactated ringers 50 mL/hr at 08/26/12 1151  . sodium chloride 0.9 % 1,000 mL with potassium chloride 10 mEq infusion 75 mL/hr at 08/29/12 0001   PRN Meds:.bisacodyl, gabapentin, HYDROcodone-acetaminophen, menthol-cetylpyridinium, methocarbamol (ROBAXIN) IV, methocarbamol, metoCLOPramide (REGLAN) injection, metoCLOPramide, morphine injection, ondansetron, ondansetron (ZOFRAN) IV, ondansetron, phenol, polyethylene glycol   Physical Exam: Filed Vitals:   08/30/12 0530  BP: 131/75  Pulse: 97  Temp: 98.5 F (36.9 C)  Resp: 17    Intake/Output Summary (Last 24 hours) at 08/30/12 1610 Last data filed at 08/29/12 2047  Gross per 24 hour  Intake   1200 ml  Output   2225 ml  Net  -1025 ml   Gen'l - e.derly white woman in no distress HEENT- [place]/AT, C&S clear, PERRLA Neck - supple, no JVD Cor - 2+ radial pulse, RRR PUlm - normal respirations Abd- BS+,. Soft, no guarding or rebound Genitalia - not examined - foley is in place MSK - right forearm in splint. Right hip wound clean and dry.  Drain remains in place - scant amount of blood Neuro - A&O x 3, MAE      Assessment/Plan: 1. Ortho - POD #4. Ready for rehab per ortho. Plan  SNF for rehab  Ortho to manage drain - may remove today.  If no SNF bed will continue in-pt PT  2. HTN -  BP Readings from Last 3 Encounters:  08/30/12 131/75  08/30/12 131/75  08/20/12 134/90   Pressure is holding steady.  Plan Will resume furosemide at home dose  3. F/E/N - mild hyponatremia but asymptomatic. Creatinine remains normal   5. Pulm - normal respirations. Will not pursue work-up of RUL mass until after rehab.   Dispo - transfer to SNF when bed available.   Priority dictation done # [id number]. Scripts and DNR sheet on shadow chart.   Illene Regulus [facility] IM (o) [phone]; (c) [phone] Call-grp - Pats[facility] IM  Tele: [phone]  08/30/2012, 6:20 AM

## 2012-09-02 ENCOUNTER — Encounter (INDEPENDENT_AMBULATORY_CARE_PROVIDER_SITE_OTHER): Payer: Medicare Other | Admitting: Ophthalmology

## 2012-09-03 ENCOUNTER — Telehealth: Payer: Self-pay

## 2012-09-03 NOTE — Telephone Encounter (Signed)
Clyde notified of message.

## 2012-09-03 NOTE — Telephone Encounter (Signed)
That would be her son.  Our records indicate 100 mg twice a day. Facility physician may change as needed. I do not attend patients at SNFs

## 2012-09-03 NOTE — Telephone Encounter (Signed)
Phone call from patients husband [name] just wanting to verify how often pt is to take Gabapentin. It is 1 in the morning and 1 in the evening, correct? He is saying patients is wanting to take 1 in the morning and 2 in the evening. Also he states patient is confused because she thinks she is in the hospital and you are coming to visit her . As you know patient is in the rehab center.

## 2012-09-06 ENCOUNTER — Non-Acute Institutional Stay (SKILLED_NURSING_FACILITY): Payer: Medicare Other | Admitting: Internal Medicine

## 2012-09-06 DIAGNOSIS — N952 Postmenopausal atrophic vaginitis: Secondary | ICD-10-CM

## 2012-09-06 DIAGNOSIS — I10 Essential (primary) hypertension: Secondary | ICD-10-CM

## 2012-09-06 DIAGNOSIS — N390 Urinary tract infection, site not specified: Secondary | ICD-10-CM

## 2012-09-06 DIAGNOSIS — S72009S Fracture of unspecified part of neck of unspecified femur, sequela: Secondary | ICD-10-CM

## 2012-09-06 DIAGNOSIS — R222 Localized swelling, mass and lump, trunk: Secondary | ICD-10-CM

## 2012-09-06 DIAGNOSIS — R918 Other nonspecific abnormal finding of lung field: Secondary | ICD-10-CM

## 2012-09-06 DIAGNOSIS — S72141A Displaced intertrochanteric fracture of right femur, initial encounter for closed fracture: Secondary | ICD-10-CM | POA: Insufficient documentation

## 2012-09-06 DIAGNOSIS — S72141S Displaced intertrochanteric fracture of right femur, sequela: Secondary | ICD-10-CM

## 2012-09-06 DIAGNOSIS — S62101S Fracture of unspecified carpal bone, right wrist, sequela: Secondary | ICD-10-CM

## 2012-09-06 DIAGNOSIS — K219 Gastro-esophageal reflux disease without esophagitis: Secondary | ICD-10-CM

## 2012-09-06 DIAGNOSIS — S62109A Fracture of unspecified carpal bone, unspecified wrist, initial encounter for closed fracture: Secondary | ICD-10-CM | POA: Insufficient documentation

## 2012-09-06 DIAGNOSIS — D509 Iron deficiency anemia, unspecified: Secondary | ICD-10-CM | POA: Insufficient documentation

## 2012-09-06 DIAGNOSIS — S42309S Unspecified fracture of shaft of humerus, unspecified arm, sequela: Secondary | ICD-10-CM

## 2012-09-06 DIAGNOSIS — K5900 Constipation, unspecified: Secondary | ICD-10-CM

## 2012-09-06 NOTE — Progress Notes (Signed)
Patient ID: MIHIRA TOZZI, female   DOB: 1915/04/05, 79 y.o.   MRN: [id number]    PCP: Illene Regulus, MD  Code Status: full code  Allergies  Allergen Reactions  . Aleve (Naproxen Sodium) Swelling    Legs and feet    Chief Complaint: new admit post hospitalization 08/25/12- 08/30/12  HPI:  79 y/o female patient is here for STR post hospital admission with right hip intertrochanteric fracture after a fall. She has history of HTN, esophageal reflux and atrophic vaginitis. Her wrist xray showed displaced angulated distal ulnar metaphyseal fracture and did not undergo surgery for this. Splint was placed and pain medications were started. Her ct scan showed incidental apical lung mass and she will undergo outpatient workup for this. She was discharged to [facility] for STR Patient was seen in her room today. She mentions that her pain is under control with current pain medication but is not having bowel movement. She is working with therapy team. No concerns from staff. She is very hard of hearing and I would need to write on her board for communication. See ros  Review of Systems  Constitutional: Negative for fever, chills and diaphoresis.  HENT: Negative for congestion.   Eyes: Negative for blurred vision.  Respiratory: Negative for shortness of breath.   Cardiovascular: Negative for chest pain.  Gastrointestinal: Positive for constipation. Negative for heartburn and abdominal pain.  Genitourinary: Negative for dysuria.  Musculoskeletal: Positive for joint pain.  Skin: Negative for rash.  Neurological: Negative for dizziness, weakness and headaches.  Psychiatric/Behavioral: Negative for depression.   CONSULTANTS:  Madlyn Frankel. Charlann Boxer, M.D., for Orthopedic Surgery.  Past Medical History  Diagnosis Date  . History of hiatal hernia   . Mitral valve prolapse   . Plantar fasciitis   . Hay fever   . GERD (gastroesophageal reflux disease)   . Palpitations   . Hypertension   . Neuromuscular  disorder   . Deafness     "very HOH; even w/hearing aides we have to communicate via writing things down for her to read" (08/25/2012)  . H/O hiatal hernia   . Arthritis     "a little all over" (08/25/2012)   Past Surgical History  Procedure Laterality Date  . Foot surgery Bilateral ~ 2000    "pinched nerve on feet" (08/25/2012)  . Vesicovaginal fistula closure w/ tah    . Abdominal hysterectomy  1998  . Tonsillectomy and adenoidectomy  1940's  . Cesarean section  1952  . Knee arthroscopy w/ meniscectomy Left 07/15/2010    partial medial and lateral meniscectomies.Hattie Perch 07/15/2010 (08/25/2012)  . Hip arthroplasty Right 08/26/2012    Procedure: ARTHROPLASTY BIPOLAR HIP;  Surgeon: Shelda Pal, MD;  Location: [facility] OR;  Service: Orthopedics;  Laterality: Right;   Social History:   reports that she has never smoked. She has never used smokeless tobacco. She reports that she does not drink alcohol or use illicit drugs.  No family history on file.  Medications: Patient's Medications  New Prescriptions   No medications on file  Previous Medications   BISACODYL (DULCOLAX) 10 MG SUPPOSITORY    Place 1 suppository (10 mg total) rectally daily as needed.   CALCIUM GLYCEROPHOSPHATE (PRELIEF PO)    Take 1-2 tablets by mouth daily as needed (for acidic foods. pt can take up to 2 tabs for indegestion.).   CONJUGATED ESTROGENS (PREMARIN) VAGINAL CREAM    Place 1 g vaginally daily.   CRANBERRY 140-100-3 MG-MG-UNIT CAPS    Take 1 capsule  by mouth 4 (four) times daily.   ENOXAPARIN (LOVENOX) 40 MG/0.4ML INJECTION    Inject 0.4 mLs (40 mg total) into the skin daily.   FERROUS SULFATE 325 (65 FE) MG TABLET    Take 1 tablet (325 mg total) by mouth 3 (three) times daily after meals.   FUROSEMIDE (LASIX) 40 MG TABLET    Take 1 tablet (40 mg total) by mouth daily.   GABAPENTIN (NEURONTIN) 100 MG CAPSULE    Take 1 capsule (100 mg total) by mouth 2 (two) times daily as needed (for continuous pain).    HYDROCODONE-ACETAMINOPHEN (NORCO/VICODIN) 5-325 MG PER TABLET    Take 1-2 tablets by mouth every 6 (six) hours as needed for pain.   LOSARTAN (COZAAR) 50 MG TABLET    Take 50 mg by mouth daily.   MENTHOL-CETYLPYRIDINIUM (CEPACOL) 3 MG LOZENGE    Take 1 lozenge (3 mg total) by mouth as needed.   METHOCARBAMOL (ROBAXIN) 500 MG TABLET    Take 1 tablet (500 mg total) by mouth every 6 (six) hours as needed.   METOPROLOL SUCCINATE (TOPROL-XL) 100 MG 24 HR TABLET    Take 100 mg by mouth daily. Take with or immediately following a meal.   OMEPRAZOLE (PRILOSEC) 20 MG CAPSULE    Take 1 capsule (20 mg total) by mouth daily.   SENNA (SENOKOT) 8.6 MG TABS    Take 1 tablet (8.6 mg total) by mouth 2 (two) times daily.   TAMSULOSIN (FLOMAX) 0.4 MG CAPS    Take 1 capsule (0.4 mg total) by mouth daily.  Modified Medications   No medications on file  Discontinued Medications   No medications on file   Physical Exam: BP 138/60  Pulse 81  Temp(Src) 97.8 F (36.6 C)  Resp 20  Wt 166 lb (75.297 kg)  BMI 25.99 kg/m2  gen- elderly pleasant female in no acute distress HEENT- hard of hearing, PERRLA, no oral lesion, MMM, no JVD cvs- normal s1, s2, rrr respi- b/l CTA, no wheeze , rales, rhonchi Abdomen- soft, bowel sound present, non tender Ext- RUE forearm splint to the elbow, normal ROM  In LUE and LLE. Right LE drain in place, surgical site clean and dressing dry Neuro- no focal deficit  Labs reviewed: Basic Metabolic Panel:  Recent Labs  16/10/96 0425 08/28/12 0615 08/29/12 2153  NA 129* 132* 129*  K 4.0 3.9 4.3  CL 96 99 97  CO2 22 20 23   GLUCOSE 131* 143* 121*  BUN 13 20 17   CREATININE 0.66 0.80 0.67  CALCIUM 8.4 8.1* 8.3*   Liver Function Tests:  Recent Labs  05/06/12 1401  AST 17  ALT 17  ALKPHOS 66  BILITOT 0.8  PROT 7.3  ALBUMIN 4.2   CBC:  Recent Labs  08/25/12 1130  08/28/12 0615 08/29/12 0511 08/29/12 2153  WBC 10.9*  < > 11.5* 8.6 8.7  NEUTROABS 8.6*  --   --    --   --   HGB 12.9  < > 9.8* 9.4* 9.7*  HCT 37.2  < > 28.5* 27.5* 27.8*  MCV 91.6  < > 91.1 91.1 90.6  PLT 239  < > 146* 166 191  < > = values in this interval not displayed.  Radiological Exams:  Imaging; chest x-ray, Aug 25, 2012, at admission with minimal enlargement of the cardiac silhouette.  Chronic bronchitic changes.Osteoporosis suspected.  Diagnostic hip x-ray right with displaced right femoral neck fracture,  suspected osteoporosis.  Diagnostic x-ray right wrist, which  showed displaced and angulated distal radial ulnar metaphyseal fracture.  CT of cervical spine, Aug 25, 2012, which showed no evidence of acute cervical injury.  The left apical lung mass measuring up to 4cm in maximal diameter is noted.  CT of the head without contrast, Aug 25, 2012, with no acute findings,  stable atrophy and mild small vessel disease.  Aug 25, 2012, CT of the chest with left apical lung mass measuring 3.7 x 2.0 x 2.2 cm, and it does contact the pleural surface.  No additional pulmonary nodules were present.  No axillary lymphadenopathy was noted.  No mediastinal or hilar lymphadenopathy was noted.  Portable x-ray of the pelvis which showed expected postoperative appearance of right hip prosthesis with no evidence of fracture.  Assessment/Plan  Right hip fracture- s/p ORIF and undergoing therapy here. Drain has been removed and incision site healing well.  To follow with dr Charlann Boxer in 2 weeks. Is 50% weight bearing in right leg with a walker and arm rest. Posterior hip precautions to be taken Continue lovenox for anticoagulation, norco prn for pain ad robaxin for muscle spasm.   Non displaced Right wrist fracture To follow up with Dr Melvyn Novas in a week. Has splint in place. Pain well controlled. Has appointment tomorrow  uti- on augmentin for 10 days with folarastor. Monitor clinically. Continue cranberry capsules  HTN- well controlled. Continue losartan and metoprolol succinate and lasix. Check  bmp  Constipation- on dulcolax suppository and senna with no improvement. Will d/c senna and have her on senna-s and miralax  Apical lung mass- to be followed as outpatient with her PCP. Currently no clinical symptoms of lung malignancy, no lymphedema, appetite is good. Monitor clinically until patient decides on the diagnosis/ treatment option  Atrophic vaginitis- continue premarin cream  GERD- continue PPI  Iron def anemia- continue feso4  Labs/tests ordered- cbc, cmp

## 2012-09-09 NOTE — Anesthesia Preprocedure Evaluation (Addendum)
Anesthesia Evaluation  Patient identified by MRN, date of birth, ID band Patient awake    Reviewed: Allergy & Precautions, H&P , NPO status , Patient's Chart, lab work & pertinent test results  Airway Mallampati: II      Dental   Pulmonary  breath sounds clear to auscultation        Cardiovascular hypertension, Pt. on medications Rhythm:Regular     Neuro/Psych    GI/Hepatic Neg liver ROS, hiatal hernia, GERD-  ,  Endo/Other  negative endocrine ROS  Renal/GU negative Renal ROS     Musculoskeletal   Abdominal   Peds  Hematology   Anesthesia Other Findings   Reproductive/Obstetrics                           Anesthesia Physical Anesthesia Plan  ASA: III  Anesthesia Plan: General   Post-op Pain Management:    Induction: Intravenous  Airway Management Planned: LMA and Oral ETT  Additional Equipment:   Intra-op Plan:   Post-operative Plan: Extubation in OR  Informed Consent: I have reviewed the patients History and Physical, chart, labs and discussed the procedure including the risks, benefits and alternatives for the proposed anesthesia with the patient or authorized representative who has indicated his/her understanding and acceptance.   Dental advisory given  Plan Discussed with: CRNA and Anesthesiologist  Anesthesia Plan Comments:         Anesthesia Quick Evaluation

## 2012-09-10 ENCOUNTER — Encounter (HOSPITAL_COMMUNITY): Payer: Self-pay | Admitting: *Deleted

## 2012-09-10 NOTE — Progress Notes (Signed)
Spoke with Cheri Guppy at [facility] regarding pt pre-op instructions and SDW.

## 2012-09-11 ENCOUNTER — Encounter (HOSPITAL_COMMUNITY): Payer: Self-pay | Admitting: Anesthesiology

## 2012-09-11 ENCOUNTER — Ambulatory Visit (HOSPITAL_COMMUNITY): Payer: Medicare Other | Admitting: Anesthesiology

## 2012-09-11 ENCOUNTER — Encounter (HOSPITAL_COMMUNITY)
Admission: RE | Disposition: A | Discharge status: Skilled Nursing Facility | Payer: Self-pay | Source: Ambulatory Visit | Attending: Orthopedic Surgery

## 2012-09-11 ENCOUNTER — Encounter (HOSPITAL_COMMUNITY): Payer: Self-pay | Admitting: *Deleted

## 2012-09-11 ENCOUNTER — Inpatient Hospital Stay (HOSPITAL_COMMUNITY)
Admission: RE | Admit: 2012-09-11 | Discharge: 2012-09-12 | DRG: 512 | Disposition: A | Discharge status: Skilled Nursing Facility | Payer: Medicare Other | Source: Ambulatory Visit | Attending: Orthopedic Surgery | Admitting: Orthopedic Surgery

## 2012-09-11 DIAGNOSIS — I10 Essential (primary) hypertension: Secondary | ICD-10-CM | POA: Diagnosis present

## 2012-09-11 DIAGNOSIS — M129 Arthropathy, unspecified: Secondary | ICD-10-CM | POA: Diagnosis present

## 2012-09-11 DIAGNOSIS — I059 Rheumatic mitral valve disease, unspecified: Secondary | ICD-10-CM | POA: Diagnosis present

## 2012-09-11 DIAGNOSIS — S52609A Unspecified fracture of lower end of unspecified ulna, initial encounter for closed fracture: Secondary | ICD-10-CM | POA: Diagnosis present

## 2012-09-11 DIAGNOSIS — M25539 Pain in unspecified wrist: Secondary | ICD-10-CM | POA: Diagnosis present

## 2012-09-11 DIAGNOSIS — S52509A Unspecified fracture of the lower end of unspecified radius, initial encounter for closed fracture: Secondary | ICD-10-CM | POA: Diagnosis present

## 2012-09-11 DIAGNOSIS — K219 Gastro-esophageal reflux disease without esophagitis: Secondary | ICD-10-CM | POA: Diagnosis present

## 2012-09-11 DIAGNOSIS — W19XXXA Unspecified fall, initial encounter: Secondary | ICD-10-CM | POA: Diagnosis present

## 2012-09-11 DIAGNOSIS — F411 Generalized anxiety disorder: Secondary | ICD-10-CM | POA: Diagnosis present

## 2012-09-11 DIAGNOSIS — Z96649 Presence of unspecified artificial hip joint: Secondary | ICD-10-CM

## 2012-09-11 DIAGNOSIS — H9190 Unspecified hearing loss, unspecified ear: Secondary | ICD-10-CM | POA: Diagnosis present

## 2012-09-11 DIAGNOSIS — Z79899 Other long term (current) drug therapy: Secondary | ICD-10-CM

## 2012-09-11 HISTORY — DX: Other nonspecific abnormal finding of lung field: R91.8

## 2012-09-11 HISTORY — DX: Anxiety disorder, unspecified: F41.9

## 2012-09-11 HISTORY — PX: OPEN REDUCTION INTERNAL FIXATION (ORIF) DISTAL RADIAL FRACTURE: SHX5989

## 2012-09-11 SURGERY — OPEN REDUCTION INTERNAL FIXATION (ORIF) DISTAL RADIUS FRACTURE
Anesthesia: General | Site: Arm Lower | Laterality: Right | Wound class: Clean

## 2012-09-11 MED ORDER — CHLORHEXIDINE GLUCONATE 4 % EX LIQD: 60.0000 mL | Freq: Once | CUTANEOUS | Status: DC

## 2012-09-11 MED ORDER — CEFAZOLIN SODIUM-DEXTROSE 2-3 GM-% IV SOLR: 2.0000 g | INTRAVENOUS | Status: DC

## 2012-09-11 MED ORDER — FENTANYL CITRATE 0.05 MG/ML IJ SOLN
INTRAMUSCULAR | Status: DC | PRN
  Administered 2012-09-11: 25 ug via INTRAVENOUS
  Administered 2012-09-11 (×2): 50 ug via INTRAVENOUS
  Administered 2012-09-11: 25 ug via INTRAVENOUS

## 2012-09-11 MED ORDER — PANTOPRAZOLE SODIUM 40 MG PO TBEC
40.0000 mg | DELAYED_RELEASE_TABLET | Freq: Every day | ORAL | Status: DC
  Administered 2012-09-12: 40 mg via ORAL
  Filled 2012-09-11: qty 1

## 2012-09-11 MED ORDER — PROPOFOL 10 MG/ML IV BOLUS
INTRAVENOUS | Status: DC | PRN
  Administered 2012-09-11: 150 mg via INTRAVENOUS

## 2012-09-11 MED ORDER — MORPHINE SULFATE 2 MG/ML IJ SOLN
1.0000 mg | INTRAMUSCULAR | Status: DC | PRN
  Administered 2012-09-11: 2 mg via INTRAVENOUS

## 2012-09-11 MED ORDER — ONDANSETRON HCL 4 MG PO TABS: 4.0000 mg | ORAL_TABLET | Freq: Four times a day (QID) | ORAL | Status: DC | PRN

## 2012-09-11 MED ORDER — MORPHINE SULFATE 2 MG/ML IJ SOLN
INTRAMUSCULAR | Status: AC
  Filled 2012-09-11: qty 1

## 2012-09-11 MED ORDER — OXYCODONE-ACETAMINOPHEN 5-325 MG PO TABS
ORAL_TABLET | ORAL | Status: AC
  Filled 2012-09-11: qty 2

## 2012-09-11 MED ORDER — OXYCODONE-ACETAMINOPHEN 5-325 MG PO TABS
1.0000 | ORAL_TABLET | ORAL | Status: DC | PRN
  Administered 2012-09-11 (×2): 1 via ORAL
  Filled 2012-09-11: qty 2

## 2012-09-11 MED ORDER — ONDANSETRON HCL 4 MG/2ML IJ SOLN
4.0000 mg | Freq: Four times a day (QID) | INTRAMUSCULAR | Status: DC | PRN
  Administered 2012-09-11: 4 mg via INTRAVENOUS

## 2012-09-11 MED ORDER — METHOCARBAMOL 500 MG PO TABS
500.0000 mg | ORAL_TABLET | Freq: Four times a day (QID) | ORAL | Status: DC | PRN
  Administered 2012-09-11 (×2): 500 mg via ORAL
  Filled 2012-09-11: qty 1

## 2012-09-11 MED ORDER — MORPHINE SULFATE 2 MG/ML IJ SOLN: 1.0000 mg | INTRAMUSCULAR | Status: DC | PRN

## 2012-09-11 MED ORDER — CEFAZOLIN SODIUM 1-5 GM-% IV SOLN
1.0000 g | INTRAVENOUS | Status: AC
  Administered 2012-09-11: 1 g via INTRAVENOUS
  Filled 2012-09-11: qty 50

## 2012-09-11 MED ORDER — BUPIVACAINE HCL (PF) 0.25 % IJ SOLN
INTRAMUSCULAR | Status: DC | PRN
  Administered 2012-09-11: 10 mL

## 2012-09-11 MED ORDER — VITAMIN C 500 MG PO TABS
1000.0000 mg | ORAL_TABLET | Freq: Every day | ORAL | Status: DC
  Administered 2012-09-11 – 2012-09-12 (×2): 1000 mg via ORAL
  Filled 2012-09-11 (×2): qty 2

## 2012-09-11 MED ORDER — LOSARTAN POTASSIUM 50 MG PO TABS
50.0000 mg | ORAL_TABLET | Freq: Every day | ORAL | Status: DC
  Administered 2012-09-12: 50 mg via ORAL
  Filled 2012-09-11 (×2): qty 1

## 2012-09-11 MED ORDER — METHOCARBAMOL 500 MG PO TABS
ORAL_TABLET | ORAL | Status: AC
  Filled 2012-09-11: qty 1

## 2012-09-11 MED ORDER — DIPHENHYDRAMINE HCL 25 MG PO CAPS: 25.0000 mg | ORAL_CAPSULE | Freq: Four times a day (QID) | ORAL | Status: DC | PRN

## 2012-09-11 MED ORDER — POLYETHYLENE GLYCOL 3350 17 G PO PACK: 17.0000 g | PACK | Freq: Every day | ORAL | Status: DC | PRN

## 2012-09-11 MED ORDER — ONDANSETRON HCL 4 MG/2ML IJ SOLN
INTRAMUSCULAR | Status: DC | PRN
  Administered 2012-09-11: 4 mg via INTRAVENOUS

## 2012-09-11 MED ORDER — DOCUSATE SODIUM 100 MG PO CAPS
100.0000 mg | ORAL_CAPSULE | Freq: Two times a day (BID) | ORAL | Status: DC
  Administered 2012-09-11 – 2012-09-12 (×2): 100 mg via ORAL
  Filled 2012-09-11 (×2): qty 1

## 2012-09-11 MED ORDER — LACTATED RINGERS IV SOLN
INTRAVENOUS | Status: DC | PRN
  Administered 2012-09-11: 14:00:00 via INTRAVENOUS

## 2012-09-11 MED ORDER — CEFAZOLIN SODIUM-DEXTROSE 2-3 GM-% IV SOLR
INTRAVENOUS | Status: AC
  Administered 2012-09-11: 2 g via INTRAVENOUS
  Filled 2012-09-11: qty 50

## 2012-09-11 MED ORDER — ESTROGENS, CONJUGATED 0.625 MG/GM VA CREA
1.0000 g | TOPICAL_CREAM | Freq: Every day | VAGINAL | Status: DC
  Administered 2012-09-12: 0.5 via VAGINAL
  Filled 2012-09-11: qty 42.5

## 2012-09-11 MED ORDER — BUPIVACAINE HCL (PF) 0.25 % IJ SOLN
INTRAMUSCULAR | Status: AC
  Filled 2012-09-11: qty 30

## 2012-09-11 MED ORDER — HYDROCODONE-ACETAMINOPHEN 5-325 MG PO TABS
1.0000 | ORAL_TABLET | ORAL | Status: DC | PRN
  Administered 2012-09-12: 1 via ORAL
  Filled 2012-09-11: qty 1

## 2012-09-11 MED ORDER — METHOCARBAMOL 100 MG/ML IJ SOLN
500.0000 mg | Freq: Four times a day (QID) | INTRAVENOUS | Status: DC | PRN
  Filled 2012-09-11: qty 5

## 2012-09-11 MED ORDER — DEXAMETHASONE SODIUM PHOSPHATE 4 MG/ML IJ SOLN
INTRAMUSCULAR | Status: DC | PRN
  Administered 2012-09-11: 4 mg via INTRAVENOUS

## 2012-09-11 MED ORDER — LIDOCAINE HCL (CARDIAC) 20 MG/ML IV SOLN
INTRAVENOUS | Status: DC | PRN
  Administered 2012-09-11: 70 mg via INTRAVENOUS

## 2012-09-11 MED ORDER — ENOXAPARIN SODIUM 40 MG/0.4ML ~~LOC~~ SOLN
40.0000 mg | SUBCUTANEOUS | Status: DC
  Administered 2012-09-11: 40 mg via SUBCUTANEOUS
  Filled 2012-09-11 (×2): qty 0.4

## 2012-09-11 MED ORDER — GABAPENTIN 100 MG PO CAPS
100.0000 mg | ORAL_CAPSULE | Freq: Two times a day (BID) | ORAL | Status: DC | PRN
  Filled 2012-09-11: qty 1

## 2012-09-11 MED ORDER — TAMSULOSIN HCL 0.4 MG PO CAPS
0.4000 mg | ORAL_CAPSULE | Freq: Every day | ORAL | Status: DC
  Administered 2012-09-12: 0.4 mg via ORAL
  Filled 2012-09-11 (×2): qty 1

## 2012-09-11 MED ORDER — SENNA 8.6 MG PO TABS
1.0000 | ORAL_TABLET | Freq: Two times a day (BID) | ORAL | Status: DC
  Administered 2012-09-11 – 2012-09-12 (×2): 8.6 mg via ORAL
  Filled 2012-09-11 (×3): qty 1

## 2012-09-11 MED ORDER — ONDANSETRON HCL 4 MG/2ML IJ SOLN
INTRAMUSCULAR | Status: AC
  Filled 2012-09-11: qty 2

## 2012-09-11 MED ORDER — CEFAZOLIN SODIUM 1-5 GM-% IV SOLN
1.0000 g | Freq: Three times a day (TID) | INTRAVENOUS | Status: DC
  Administered 2012-09-11 – 2012-09-12 (×2): 1 g via INTRAVENOUS
  Filled 2012-09-11 (×6): qty 50

## 2012-09-11 MED ORDER — FERROUS SULFATE 325 (65 FE) MG PO TABS
325.0000 mg | ORAL_TABLET | Freq: Three times a day (TID) | ORAL | Status: DC
  Administered 2012-09-11 – 2012-09-12 (×2): 325 mg via ORAL
  Filled 2012-09-11 (×5): qty 1

## 2012-09-11 MED ORDER — ARTIFICIAL TEARS OP OINT
TOPICAL_OINTMENT | OPHTHALMIC | Status: DC | PRN
  Administered 2012-09-11: 1 via OPHTHALMIC

## 2012-09-11 MED ORDER — FUROSEMIDE 40 MG PO TABS
40.0000 mg | ORAL_TABLET | Freq: Every day | ORAL | Status: DC
  Administered 2012-09-12: 40 mg via ORAL
  Filled 2012-09-11 (×2): qty 1

## 2012-09-11 MED ORDER — FLEET ENEMA 7-19 GM/118ML RE ENEM: 1.0000 | ENEMA | Freq: Once | RECTAL | Status: AC | PRN

## 2012-09-11 MED ORDER — FENTANYL CITRATE 0.05 MG/ML IJ SOLN
INTRAMUSCULAR | Status: AC
  Administered 2012-09-11: 50 ug
  Filled 2012-09-11: qty 2

## 2012-09-11 MED ORDER — KCL IN DEXTROSE-NACL 20-5-0.9 MEQ/L-%-% IV SOLN
INTRAVENOUS | Status: DC
  Administered 2012-09-11: 50 mL/h via INTRAVENOUS
  Filled 2012-09-11 (×2): qty 1000

## 2012-09-11 MED ORDER — DEXTROSE 5 % IV SOLN
INTRAVENOUS | Status: DC | PRN
  Administered 2012-09-11: 14:00:00 via INTRAVENOUS

## 2012-09-11 MED ORDER — METOPROLOL SUCCINATE ER 100 MG PO TB24
100.0000 mg | ORAL_TABLET | Freq: Every day | ORAL | Status: DC
  Administered 2012-09-12: 100 mg via ORAL
  Filled 2012-09-11: qty 1

## 2012-09-11 SURGICAL SUPPLY — 65 items
BANDAGE ELASTIC 3 VELCRO ST LF (GAUZE/BANDAGES/DRESSINGS) ×2 IMPLANT
BANDAGE ELASTIC 4 VELCRO ST LF (GAUZE/BANDAGES/DRESSINGS) ×2 IMPLANT
BANDAGE GAUZE ELAST BULKY 4 IN (GAUZE/BANDAGES/DRESSINGS) ×2 IMPLANT
BLADE SURG ROTATE 9660 (MISCELLANEOUS) IMPLANT
BNDG CMPR 9X4 STRL LF SNTH (GAUZE/BANDAGES/DRESSINGS) ×1
BNDG ESMARK 4X9 LF (GAUZE/BANDAGES/DRESSINGS) ×2 IMPLANT
CLOTH BEACON ORANGE TIMEOUT ST (SAFETY) ×2 IMPLANT
CORDS BIPOLAR (ELECTRODE) ×2 IMPLANT
COVER SURGICAL LIGHT HANDLE (MISCELLANEOUS) ×2 IMPLANT
CUFF TOURNIQUET SINGLE 18IN (TOURNIQUET CUFF) ×2 IMPLANT
CUFF TOURNIQUET SINGLE 24IN (TOURNIQUET CUFF) IMPLANT
DRAIN TLS ROUND 10FR (DRAIN) IMPLANT
DRAPE OEC MINIVIEW 54X84 (DRAPES) ×2 IMPLANT
DRAPE SURG 17X11 SM STRL (DRAPES) ×2 IMPLANT
DRSG ADAPTIC 3X8 NADH LF (GAUZE/BANDAGES/DRESSINGS) ×2 IMPLANT
DRSG EMULSION OIL 3X3 NADH (GAUZE/BANDAGES/DRESSINGS) ×1 IMPLANT
ELECT REM PT RETURN 9FT ADLT (ELECTROSURGICAL)
ELECTRODE REM PT RTRN 9FT ADLT (ELECTROSURGICAL) IMPLANT
GAUZE SPONGE 4X4 16PLY XRAY LF (GAUZE/BANDAGES/DRESSINGS) ×2 IMPLANT
GLOVE BIOGEL PI IND STRL 8.5 (GLOVE) ×1 IMPLANT
GLOVE BIOGEL PI INDICATOR 8.5 (GLOVE) ×1
GLOVE SURG ORTHO 8.0 STRL STRW (GLOVE) ×2 IMPLANT
GOWN PREVENTION PLUS XLARGE (GOWN DISPOSABLE) ×2 IMPLANT
GOWN STRL NON-REIN LRG LVL3 (GOWN DISPOSABLE) ×6 IMPLANT
KIT BASIN OR (CUSTOM PROCEDURE TRAY) ×2 IMPLANT
KIT ROOM TURNOVER OR (KITS) ×2 IMPLANT
MANIFOLD NEPTUNE II (INSTRUMENTS) ×2 IMPLANT
NDL HYPO 25X1 1.5 SAFETY (NEEDLE) ×1 IMPLANT
NEEDLE HYPO 25X1 1.5 SAFETY (NEEDLE) ×2 IMPLANT
NS IRRIG 1000ML POUR BTL (IV SOLUTION) ×2 IMPLANT
PACK ORTHO EXTREMITY (CUSTOM PROCEDURE TRAY) ×2 IMPLANT
PAD ARMBOARD 7.5X6 YLW CONV (MISCELLANEOUS) ×4 IMPLANT
PAD CAST 4YDX4 CTTN HI CHSV (CAST SUPPLIES) ×1 IMPLANT
PADDING CAST ABS 4INX4YD NS (CAST SUPPLIES) ×1
PADDING CAST ABS COTTON 4X4 ST (CAST SUPPLIES) IMPLANT
PADDING CAST COTTON 4X4 STRL (CAST SUPPLIES) ×2
PEG LOCKING SMOOTH 2.2X13 (Peg) ×1 IMPLANT
PEG LOCKING SMOOTH 2.2X18 (Peg) ×1 IMPLANT
PEG LOCKING SMOOTH 2.2X20 (Screw) ×3 IMPLANT
PEG LOCKING SMOOTH 2.2X22 (Screw) ×3 IMPLANT
PLATE DVR CROSSLOCK STD RT (Plate) ×1 IMPLANT
SCREW  LP NL 2.7X15MM (Screw) ×1 IMPLANT
SCREW 2.7X14MM (Screw) ×1 IMPLANT
SCREW BN 14X2.7XNONLOCK 3 LD (Screw) IMPLANT
SCREW LOCK 14X2.7X 3 LD TPR (Screw) IMPLANT
SCREW LOCKING 2.7X14 (Screw) ×4 IMPLANT
SCREW LP NL 2.7X15MM (Screw) IMPLANT
SCREW NLOCK 2.7X14 (Screw) ×1 IMPLANT
SOAP 2 % CHG 4 OZ (WOUND CARE) ×2 IMPLANT
SPONGE GAUZE 4X4 12PLY (GAUZE/BANDAGES/DRESSINGS) ×2 IMPLANT
SPONGE LAP 4X18 X RAY DECT (DISPOSABLE) ×2 IMPLANT
STRIP CLOSURE SKIN 1/2X4 (GAUZE/BANDAGES/DRESSINGS) IMPLANT
SUCTION FRAZIER TIP 10 FR DISP (SUCTIONS) ×2 IMPLANT
SUT ETHILON 4 0 PS 2 18 (SUTURE) ×1 IMPLANT
SUT MNCRL AB 4-0 PS2 18 (SUTURE) IMPLANT
SUT PROLENE 4 0 PS 2 18 (SUTURE) ×1 IMPLANT
SUT VIC AB 2-0 FS1 27 (SUTURE) ×2 IMPLANT
SUT VICRYL 4-0 PS2 18IN ABS (SUTURE) ×2 IMPLANT
SYR CONTROL 10ML LL (SYRINGE) IMPLANT
SYSTEM CHEST DRAIN TLS 7FR (DRAIN) IMPLANT
TOWEL OR 17X24 6PK STRL BLUE (TOWEL DISPOSABLE) ×2 IMPLANT
TOWEL OR 17X26 10 PK STRL BLUE (TOWEL DISPOSABLE) ×2 IMPLANT
TUBE CONNECTING 12X1/4 (SUCTIONS) ×2 IMPLANT
WATER STERILE IRR 1000ML POUR (IV SOLUTION) ×2 IMPLANT
YANKAUER SUCT BULB TIP NO VENT (SUCTIONS) IMPLANT

## 2012-09-11 NOTE — Preoperative (Signed)
Beta Blockers   Reason not to administer Beta Blockers:Not Applicable 

## 2012-09-11 NOTE — Op Note (Signed)
NAMEDOMITILA, STETLER             ACCOUNT NO.:  [id number]  MEDICAL RECORD NO.:  [id number]  LOCATION:  [facility]                         FACILITY:  [facility]  PHYSICIAN:  Madelynn Done, MD  DATE OF BIRTH:  [birth year]  DATE OF PROCEDURE:  09/11/2012 DATE OF DISCHARGE:                              OPERATIVE REPORT   PREOPERATIVE DIAGNOSIS:  Right wrist intra-articular distal radius fracture, two more fragments plus angulated right distal ulna fracture, ulnar head fracture.  POSTOPERATIVE DIAGNOSIS:  Right wrist intra-articular distal radius fracture, two more fragments plus angulated right distal ulna fracture, ulnar head fracture.  ATTENDING PHYSICIAN:  Madelynn Done, MD, who scrubbed and present for the entire procedure.  ASSISTANT SURGEON:  None.  ANESTHESIA:  General.  ANESTHESIA:  Via LMA.  SURGICAL IMPLANTS AND INNOVATIONS:  Cross lock standard plate with 7 distal locking pegs, and 5 screws proximally, 2.7 mm screws.  PROCEDURE: 1. Open treatment of right wrist intra-articular distal radius     fracture, two more fragments. 2. Right wrist brachioradialis tendon release and lengthening. 3. Closed treatment of right distal ulnar head and neck fracture. 4. Radiographs of the right wrist.  SURGICAL INDICATIONS:  This patient is a right-hand-dominant female sustained an intra-articular distal radius fracture after a fall.  The patient was seen and evaluated in the office and based on the degree of displacement, it was recommended that she undergo the above procedure. Risks, benefits, and alternatives were discussed in detail with the patient and a signed informed consent was obtained.  Risks include, but not limited to bleeding, infection, damage to nearby nerves, arteries, or tendons, nonunion, malunion, hardware failure, loss of motion of the wrist and digits, and need for further surgical intervention.  DESCRIPTION OF PROCEDURE:  The patient was appropriately  identified in the preop holding area and marked with a permanent marker made on the right wrist to indicate correct operative site.  The patient tolerated this well.  The patient then brought back to the operating room, placed supine on the anesthesia table.  General anesthesia was administered. The patient received preoperative antibiotics.  Right upper extremity and a well-padded tourniquet was then placed.  The right brachium and sealed with 1000 drape.  The right upper extremity was then prepped and draped in normal sterile fashion.  Time-out was called, correct side was identified, and procedure then begun.  Attention then turned to the right wrist.  The limb was then elevated using Esmarch exsanguination tourniquet insufflated.  A longitudinal incision made directly over the FCR sheath.  Dissection was carried down through the skin subcutaneous tissue.  The FCR sheath was then opened proximally distally.  Sharp dissection then going through the floor of the FCR sheath, the FPL was identified and an L-shaped pronator quadratus flap was then elevated. The brachioradialis was then carefully lengthened in a Z-fashion and released in order to reduce the radial column with careful dissection the first dorsal compartment tendons.  After brachioradialis tendon lengthening and then partial released the wound was then thoroughly irrigated.  The fracture site was then opened up with a two fracture line extending into the articular surface of two more articular fragments.  Careful open  reduction was then carried out.  Appropriate size DVR plate was then chosen.  The oblong screw hole was then drilled with a 2.2-mm drill bit and a 2.7-mm bicortical screw placed proximally. Plate height was adjusted.  After position, it was then confirmed using mini C-arm.  The distal row fixation carried out beginning with the ulnar and radial columns.  These were locking pegs.  A total of 7 locking pegs were  then placed.  Attention then turned proximally where two more nonlocking screws as well as two locking screws were then placed in the shaft.  The wound was then irrigated.  The patient's stress radiography was then carried out.  The ulna appeared to be stable, closed treatment of the ulnar head and neck fracture was then carried out.  The wound was then irrigated.  The pronator quadratus was then closed with 2-0 Vicryl.  The hemostasis was obtained.  Tourniquet deflated.  The subcutaneous tissues closed with 4-0 Vicryl and the skin closed with simple Prolene sutures.  Adaptic dressing, sterile compressive bandage was applied.  The patient tolerated the procedure well returned to recovery room in good condition after being placed in a well-padded volar splint.  Intraoperative radiographs, 3-view of the wrist do show the volar plate fixation in place.  There was good position in both planes as well as the ulnar head fracture and neck fracture.  POSTPROCEDURE PLAN:  The patient will be admitted for IV antibiotics, pain control, discharged back to her care facility likely on Monday, seen back in the office in approximately 14 days for wound check, suture removal, x-rays, application of short-arm cast for total of 4 weeks, and begin a therapy regimen around the 4 weeks mark with radiographs at each visit.     Madelynn Done, MD     FWO/MEDQ  D:  09/11/2012  T:  09/11/2012  Job:  [id number]

## 2012-09-11 NOTE — Anesthesia Postprocedure Evaluation (Signed)
  Anesthesia Post-op Note  Patient: [name]  Procedure(s) Performed: Procedure(s): OPEN REDUCTION INTERNAL FIXATION (ORIF) RIGHT DISTAL RADIAL FRACTURE/ULNAR POSSIBLE BONE GRAFTING (Right)  Patient Location: PACU  Anesthesia Type:General  Level of Consciousness: awake  Airway and Oxygen Therapy: Patient Spontanous Breathing  Post-op Pain: mild  Post-op Assessment: Post-op Vital signs reviewed  Post-op Vital Signs: Reviewed  Complications: No apparent anesthesia complications

## 2012-09-11 NOTE — Transfer of Care (Signed)
Immediate Anesthesia Transfer of Care Note  Patient: [name]  Procedure(s) Performed: Procedure(s): OPEN REDUCTION INTERNAL FIXATION (ORIF) RIGHT DISTAL RADIAL FRACTURE/ULNAR POSSIBLE BONE GRAFTING (Right)  Patient Location: PACU  Anesthesia Type:General  Level of Consciousness: sedated, patient cooperative and responds to stimulation  Airway & Oxygen Therapy: Patient Spontanous Breathing and Patient connected to nasal cannula oxygen  Post-op Assessment: Report given to PACU RN, Post -op Vital signs reviewed and stable, Patient moving all extremities and Patient moving all extremities X 4  Post vital signs: Reviewed and stable  Complications: No apparent anesthesia complications

## 2012-09-11 NOTE — Brief Op Note (Signed)
09/11/2012  1:58 PM  PATIENT:  [name]  79 y.o. female  PRE-OPERATIVE DIAGNOSIS:  right distal radius fracture and right distal ulnar fracture  POST-OPERATIVE DIAGNOSIS:  SAME  PROCEDURE:  Procedure(s): OPEN REDUCTION INTERNAL FIXATION (ORIF) RIGHT DISTAL RADIAL FRACTURE/ULNAR POSSIBLE BONE GRAFTING (Right)  SURGEON:  Surgeon(s) and Role:    * Sharma Covert, MD - Primary  PHYSICIAN ASSISTANT: NONE  ASSISTANTS: none   ANESTHESIA:   general  EBL:     BLOOD ADMINISTERED:none  DRAINS: none   LOCAL MEDICATIONS USED:  MARCAINE     SPECIMEN:  No Specimen  DISPOSITION OF SPECIMEN:  N/A  COUNTS:  YES  TOURNIQUET:    DICTATION: [id number]  PLAN OF CARE: Admit to inpatient   PATIENT DISPOSITION:  PACU - hemodynamically stable.   Delay start of Pharmacological VTE agent (>24hrs) due to surgical blood loss or risk of bleeding: not applicable

## 2012-09-11 NOTE — H&P (Signed)
[name] is an 79 y.o. female.   Chief Complaint: right distal radius fracture HPI: pt with displaced right distal radius fracture and right hip fracture. Had hip taken care of and now with right distal radius fracture that is displaced, here for surgery to improve correct alignment of right distal radius Pt seen and examined in the office  Past Medical History  Diagnosis Date  . History of hiatal hernia   . Mitral valve prolapse   . Plantar fasciitis   . Hay fever   . GERD (gastroesophageal reflux disease)   . Palpitations   . Hypertension   . Neuromuscular disorder   . Deafness     "very HOH; even w/hearing aides we have to communicate via writing things down for her to read" (08/25/2012)  . H/O hiatal hernia   . Arthritis     "a little all over" (08/25/2012)  . Lung mass     Hx: of  . Anxiety     Past Surgical History  Procedure Laterality Date  . Foot surgery Bilateral ~ 2000    "pinched nerve on feet" (08/25/2012)  . Vesicovaginal fistula closure w/ tah    . Abdominal hysterectomy  1998  . Tonsillectomy and adenoidectomy  1940's  . Cesarean section  1952  . Knee arthroscopy w/ meniscectomy Left 07/15/2010    partial medial and lateral meniscectomies.Hattie Perch 07/15/2010 (08/25/2012)  . Hip arthroplasty Right 08/26/2012    Procedure: ARTHROPLASTY BIPOLAR HIP;  Surgeon: Shelda Pal, MD;  Location: [facility] OR;  Service: Orthopedics;  Laterality: Right;    History reviewed. No pertinent family history. Social History:  reports that she has never smoked. She has never used smokeless tobacco. She reports that she does not drink alcohol or use illicit drugs.  Allergies:  Allergies  Allergen Reactions  . Aleve (Naproxen Sodium) Swelling    Legs and feet    Medications Prior to Admission  Medication Sig Dispense Refill  . bisacodyl (DULCOLAX) 10 MG suppository Place 1 suppository (10 mg total) rectally daily as needed.  12 suppository  0  . Calcium Glycerophosphate  (PRELIEF PO) Take 1-2 tablets by mouth daily as needed (for acidic foods. pt can take up to 2 tabs for indegestion.).      Marland Kitchen conjugated estrogens (PREMARIN) vaginal cream Place 1 g vaginally daily.      Marland Kitchen enoxaparin (LOVENOX) 40 MG/0.4ML injection Inject 0.4 mLs (40 mg total) into the skin daily.  14 Syringe  0  . ferrous sulfate 325 (65 FE) MG tablet Take 1 tablet (325 mg total) by mouth 3 (three) times daily after meals.    3  . furosemide (LASIX) 40 MG tablet Take 1 tablet (40 mg total) by mouth daily.  30 tablet  11  . gabapentin (NEURONTIN) 100 MG capsule Take 1 capsule (100 mg total) by mouth 2 (two) times daily as needed (for continuous pain).      Marland Kitchen HYDROcodone-acetaminophen (NORCO/VICODIN) 5-325 MG per tablet Take 1-2 tablets by mouth every 6 (six) hours as needed for pain.  90 tablet  0  . losartan (COZAAR) 50 MG tablet Take 50 mg by mouth daily.      . methocarbamol (ROBAXIN) 500 MG tablet Take 1 tablet (500 mg total) by mouth every 6 (six) hours as needed.      . metoprolol succinate (TOPROL-XL) 100 MG 24 hr tablet Take 100 mg by mouth daily. Take with or immediately following a meal.      . omeprazole (  PRILOSEC) 20 MG capsule Take 1 capsule (20 mg total) by mouth daily.      Marland Kitchen senna (SENOKOT) 8.6 MG TABS Take 1 tablet (8.6 mg total) by mouth 2 (two) times daily.  120 each  0  . tamsulosin (FLOMAX) 0.4 MG CAPS Take 1 capsule (0.4 mg total) by mouth daily.  30 capsule    . Cranberry 140-100-3 MG-MG-UNIT CAPS Take 1 capsule by mouth 4 (four) times daily.      Marland Kitchen menthol-cetylpyridinium (CEPACOL) 3 MG lozenge Take 1 lozenge (3 mg total) by mouth as needed.  100 tablet  12    No results found for this or any previous visit (from the past 48 hour(s)). No results found.  NO RECENT ILLNESSES RECENTLY HOSPITALIZED FOR RIGHT HIP HEMIARTHROPLASTY  Blood pressure 105/64, pulse 80, temperature 97.6 F (36.4 C), temperature source Oral, resp. rate 18, height 5\' 7"  (1.702 m), weight 76.658 kg  (169 lb), SpO2 94.00%. General Appearance:  Alert, cooperative, no distress, appears stated age  Head:  Normocephalic, without obvious abnormality, atraumatic  Eyes:  Pupils equal, conjunctiva/corneas clear,         Throat: Lips, mucosa, and tongue normal; teeth and gums normal  Neck: No visible masses     Lungs:   respirations unlabored  Chest Wall:  No tenderness or deformity  Heart:  Regular rate and rhythm,  Abdomen:   Soft, non-tender,         Extremities: RIGHT UE: IN SPLINT. FINGERS WARM WELL PERFUSED GOOD CAP REFIL. ABLE TO EXTEND THUMB, ABLE TO WIGGLE FINGERS  Pulses: 2+ and symmetric  Skin: Skin color, texture, turgor normal, no rashes or lesions     Neurologic: Normal    Assessment/Plan RIGHT DISTAL RADIUS FRACTURE, DISPLACED ANGULATED  RIGHT DISTAL RADIUS OPEN REDUCTION AND INTERNAL FIXATION  R/B/A DISCUSSED WITH PT IN OFFICE.  PT VOICED UNDERSTANDING OF PLAN CONSENT SIGNED DAY OF SURGERY PT SEEN AND EXAMINED PRIOR TO OPERATIVE PROCEDURE/DAY OF SURGERY SITE MARKED. QUESTIONS ANSWERED WILL REMAIN AN INPATIENT FOLLOWING SURGERY  Sharma Covert 09/11/2012, 1:35 PM

## 2012-09-12 ENCOUNTER — Encounter (HOSPITAL_COMMUNITY): Payer: Self-pay | Admitting: Orthopedic Surgery

## 2012-09-12 DIAGNOSIS — S52609A Unspecified fracture of lower end of unspecified ulna, initial encounter for closed fracture: Secondary | ICD-10-CM | POA: Diagnosis not present

## 2012-09-12 DIAGNOSIS — M25539 Pain in unspecified wrist: Secondary | ICD-10-CM | POA: Diagnosis not present

## 2012-09-12 MED ORDER — HYDROCODONE-ACETAMINOPHEN 5-300 MG PO TABS: 1.0000 | ORAL_TABLET | Freq: Four times a day (QID) | ORAL | Status: DC | PRN

## 2012-09-12 MED ORDER — VITAMIN C 500 MG PO TABS: 500.0000 mg | ORAL_TABLET | Freq: Every day | ORAL | Status: DC

## 2012-09-12 NOTE — Discharge Summary (Signed)
Physician Discharge Summary  Patient ID: [name] MRN: [id number] DOB/AGE: April 19, 1915 79 y.o.  Admit date: 09/11/2012 Discharge date: 09/12/2012  Admission Diagnoses: right distal radius fracture and right distal ulnar fracture Past Medical History  Diagnosis Date  . History of hiatal hernia   . Mitral valve prolapse   . Plantar fasciitis   . Hay fever   . GERD (gastroesophageal reflux disease)   . Palpitations   . Hypertension   . Neuromuscular disorder   . Deafness     "very HOH; even w/hearing aides we have to communicate via writing things down for her to read" (08/25/2012)  . H/O hiatal hernia   . Arthritis     "a little all over" (08/25/2012)  . Lung mass     Hx: of  . Anxiety     Discharge Diagnoses:  Right distal radius fracture  Surgeries: Procedure(s): OPEN REDUCTION INTERNAL FIXATION (ORIF) RIGHT DISTAL RADIAL FRACTURE/ULNAR POSSIBLE BONE GRAFTING on 09/11/2012    Consultants:  none  Discharged Condition: Improved  Hospital Course: [name] is an 79 y.o. female who was admitted 09/11/2012 with a chief complaint of No chief complaint on file. , and found to have a diagnosis of right distal radius fracture and right distal ulnar fracture.  They were brought to the operating room on 09/11/2012 and underwent Procedure(s): OPEN REDUCTION INTERNAL FIXATION (ORIF) RIGHT DISTAL RADIAL FRACTURE/ULNAR POSSIBLE BONE GRAFTING.    They were given perioperative antibiotics: Anti-infectives   Start     Dose/Rate Route Frequency Ordered Stop   09/11/12 2215  ceFAZolin (ANCEF) IVPB 1 g/50 mL premix     1 g 100 mL/hr over 30 Minutes Intravenous 3 times per day 09/11/12 1753     09/11/12 1800  ceFAZolin (ANCEF) IVPB 1 g/50 mL premix     1 g 100 mL/hr over 30 Minutes Intravenous NOW 09/11/12 1753 09/11/12 1936   09/11/12 0730  ceFAZolin (ANCEF) IVPB 2 g/50 mL premix  Status:  Discontinued     2 g 100 mL/hr over 30 Minutes Intravenous On call to O.R. 09/11/12  0721 09/11/12 1731   09/11/12 0724  ceFAZolin (ANCEF) 2-3 GM-% IVPB SOLR    Comments:  GALLMAN, KATHIE JEAN: cabinet override      09/11/12 0724 09/11/12 1419    .  They were given sequential compression devices, early ambulation, and Other (comment)Lovenox for DVT prophylaxis.  Recent vital signs: Patient Vitals for the past 24 hrs:  BP Temp Temp src Pulse Resp SpO2 Height Weight  09/12/12 0632 127/62 mmHg 97.9 F (36.6 C) - 82 18 99 % - -  09/12/12 0201 106/46 mmHg 97.4 F (36.3 C) - 73 18 96 % - -  09/11/12 2149 118/56 mmHg 97.7 F (36.5 C) - 82 18 99 % - -  09/11/12 1800 134/53 mmHg 98.5 F (36.9 C) - 78 18 98 % - -  09/11/12 1700 147/66 mmHg 98 F (36.7 C) - 80 18 100 % - -  09/11/12 1645 143/86 mmHg - - 77 17 99 % - -  09/11/12 1630 145/70 mmHg - - 76 27 98 % - -  09/11/12 1615 137/63 mmHg - - 74 17 96 % - -  09/11/12 1545 152/65 mmHg - - 71 16 96 % - -  09/11/12 1534 138/72 mmHg 97.9 F (36.6 C) - 70 16 97 % - -  09/11/12 1003 105/64 mmHg 97.6 F (36.4 C) Oral 80 18 94 % 5\' 7"  (1.702 m) 76.658  kg (169 lb)  .  Recent laboratory studies: No results found.  Discharge Medications:     Medication List    ASK your doctor about these medications       bisacodyl 10 MG suppository  Commonly known as:  DULCOLAX  Place 1 suppository (10 mg total) rectally daily as needed.     conjugated estrogens vaginal cream  Commonly known as:  PREMARIN  Place 1 g vaginally daily.     Cranberry 140-100-3 MG-MG-UNIT Caps  Take 1 capsule by mouth 4 (four) times daily.     enoxaparin 40 MG/0.4ML injection  Commonly known as:  LOVENOX  Inject 0.4 mLs (40 mg total) into the skin daily.     ferrous sulfate 325 (65 FE) MG tablet  Take 1 tablet (325 mg total) by mouth 3 (three) times daily after meals.     furosemide 40 MG tablet  Commonly known as:  LASIX  Take 1 tablet (40 mg total) by mouth daily.     gabapentin 100 MG capsule  Commonly known as:  NEURONTIN  Take 1 capsule  (100 mg total) by mouth 2 (two) times daily as needed (for continuous pain).     HYDROcodone-acetaminophen 5-325 MG per tablet  Commonly known as:  NORCO/VICODIN  Take 1-2 tablets by mouth every 6 (six) hours as needed for pain.     losartan 50 MG tablet  Commonly known as:  COZAAR  Take 50 mg by mouth daily.     menthol-cetylpyridinium 3 MG lozenge  Commonly known as:  CEPACOL  Take 1 lozenge (3 mg total) by mouth as needed.     methocarbamol 500 MG tablet  Commonly known as:  ROBAXIN  Take 1 tablet (500 mg total) by mouth every 6 (six) hours as needed.     metoprolol succinate 100 MG 24 hr tablet  Commonly known as:  TOPROL-XL  Take 100 mg by mouth daily. Take with or immediately following a meal.     omeprazole 20 MG capsule  Commonly known as:  PRILOSEC  Take 1 capsule (20 mg total) by mouth daily.     PRELIEF PO  Take 1-2 tablets by mouth daily as needed (for acidic foods. pt can take up to 2 tabs for indegestion.).     senna 8.6 MG Tabs  Commonly known as:  SENOKOT  Take 1 tablet (8.6 mg total) by mouth 2 (two) times daily.     tamsulosin 0.4 MG Caps  Commonly known as:  FLOMAX  Take 1 capsule (0.4 mg total) by mouth daily.        Diagnostic Studies: Dg Chest 1 View  08/25/2012   *RADIOLOGY REPORT*  Clinical Data: Right hip and wrist injury post fall  CHEST - 1 VIEW  Comparison: 04/15/2011  Findings: Enlargement of cardiac silhouette. Tortuous aorta. Pulmonary vascularity normal. Chronic bronchitic changes and accentuation of perihilar markings little changed from previous study. No definite acute infiltrate, pleural effusion or pneumothorax. Bones appear diffusely demineralized with old post-traumatic deformity of the proximal right humerus.  IMPRESSION: Minimal enlargement of cardiac silhouette. Chronic bronchitic changes. Osteoporosis suspected.   Original Report Authenticated By: Ulyses Southward, M.D.   Dg Wrist Complete Right  08/25/2012   *RADIOLOGY REPORT*   Clinical Data: Right wrist injury, pain and deformity post fall  RIGHT WRIST - COMPLETE 3+ VIEW  Comparison: None  Findings: Diffuse osseous demineralization. Joint spaces preserved. Displaced distal right ulnar metaphyseal fracture with apex volar angulation. Transverse metaphyseal fracture distal right  radius, with mild apex volar angulation and dorsal displacement. No definite intra-articular extension at either fracture. Scattered chondrocalcinosis. No additional fracture or dislocation seen.  IMPRESSION: Displaced and angulated distal radial ulnar metaphyseal fractures as above.   Original Report Authenticated By: Ulyses Southward, M.D.   Dg Hip Complete Right  08/25/2012   *RADIOLOGY REPORT*  Clinical Data: Right hip pain post fall, injury  RIGHT HIP - COMPLETE 2+ VIEW  Comparison: 11/15/2010  Findings: Osseous demineralization. Symmetric hip and SI joints. Displaced right femoral neck fracture. No dislocation. No additional fracture or dislocation identified.  IMPRESSION: Displaced right femoral neck fracture. Suspect osteoporosis.   Original Report Authenticated By: Ulyses Southward, M.D.   Ct Head Wo Contrast  08/25/2012   *RADIOLOGY REPORT*  Clinical Data:  Fall with injury.  CT HEAD WITHOUT CONTRAST CT CERVICAL SPINE WITHOUT CONTRAST  Technique:  Multidetector CT imaging of the head and cervical spine was performed following the standard protocol without intravenous contrast.  Multiplanar CT image reconstructions of the cervical spine were also generated.  Comparison:  CT of the head dated 04/16/2011.  CT HEAD  Findings: Stable atrophy and mild small vessel disease. The brain demonstrates no evidence of hemorrhage, infarction, edema, mass effect, extra-axial fluid collection, hydrocephalus or mass lesion. The skull is unremarkable.  IMPRESSION: No acute findings.  Stable atrophy and mild small vessel disease.  CT CERVICAL SPINE  Findings: The cervical spine demonstrates normal alignment and no evidence of  fracture or subluxation.  Mild degenerative changes are present throughout the cervical spine.  No soft tissue swelling or fluid collection is identified.  There is incidental detection of a spiculated mass like opacity at the left lung apex measuring approximately 2.3 x 4.1 cm.  Based on appearance, this is highly worrisome for malignancy.  A mass like infiltrate could also conceivably have a similar appearance. Further characterization with CT of the chest is warranted.  IMPRESSION:  1.  No evidence of acute cervical injury. 2.  Left apical lung mass measuring up to 4 cm in maximal diameter and worrisome for malignancy.  Recommend further characterization with CT of the chest.   Original Report Authenticated By: Irish Lack, M.D.   Ct Chest W Contrast  08/25/2012   *RADIOLOGY REPORT*  Clinical Data: Apical mass on neck CT  CT CHEST WITH CONTRAST  Technique:  Multidetector CT imaging of the chest was performed following the standard protocol during bolus administration of intravenous contrast.  Contrast: 80mL OMNIPAQUE IOHEXOL 300 MG/ML  SOLN  Comparison: Neck CT 08/17/2012  Findings: Within the left lung apex there is a mass-like conglomeration measuring 3.7 x 2.0 by 2.2 cm.  This contacts the pleural surface.  No additional pulmonary nodules are present.  There is no axillary lymphadenopathy.  No mediastinal or hilar lymphadenopathy.  The pulmonary arteries are enlarged with the right main pulmonary artery measuring 36 mm.  Limited view of the upper abdomen shows normal adrenal glands.  The kidneys are not evaluated.  No focal hepatic lesion limited view of the liver.  No aggressive osseous lesions.  IMPRESSION:  1.  Left apical lung mass.  Differential includes bronchogenic carcinoma versus focus of pulmonary infection.  Consider percutaneous biopsy versus antibiotic therapy and re-imaging if the patient has clinical findings consistent with pulmonary infection. 2.  No evidence of mediastinal nodal  metastasis. 3.  Enlarged pulmonary arteries consistent with pulmonary hypertension.   Original Report Authenticated By: Genevive Bi, M.D.   Ct Cervical Spine Wo Contrast  08/25/2012   *  RADIOLOGY REPORT*  Clinical Data:  Fall with injury.  CT HEAD WITHOUT CONTRAST CT CERVICAL SPINE WITHOUT CONTRAST  Technique:  Multidetector CT imaging of the head and cervical spine was performed following the standard protocol without intravenous contrast.  Multiplanar CT image reconstructions of the cervical spine were also generated.  Comparison:  CT of the head dated 04/16/2011.  CT HEAD  Findings: Stable atrophy and mild small vessel disease. The brain demonstrates no evidence of hemorrhage, infarction, edema, mass effect, extra-axial fluid collection, hydrocephalus or mass lesion. The skull is unremarkable.  IMPRESSION: No acute findings.  Stable atrophy and mild small vessel disease.  CT CERVICAL SPINE  Findings: The cervical spine demonstrates normal alignment and no evidence of fracture or subluxation.  Mild degenerative changes are present throughout the cervical spine.  No soft tissue swelling or fluid collection is identified.  There is incidental detection of a spiculated mass like opacity at the left lung apex measuring approximately 2.3 x 4.1 cm.  Based on appearance, this is highly worrisome for malignancy.  A mass like infiltrate could also conceivably have a similar appearance. Further characterization with CT of the chest is warranted.  IMPRESSION:  1.  No evidence of acute cervical injury. 2.  Left apical lung mass measuring up to 4 cm in maximal diameter and worrisome for malignancy.  Recommend further characterization with CT of the chest.   Original Report Authenticated By: Irish Lack, M.D.   Dg Pelvis Portable  08/26/2012   *RADIOLOGY REPORT*  Clinical Data: Postop right hip arthroplasty.  PORTABLE PELVIS AND RIGHT HIP - 2 VIEW  Comparison: None.  Findings: A right hip prosthesis is seen in  appropriate position. No evidence of fracture or dislocation.  Overlying surgical drain is noted.  IMPRESSION: Expected postoperative appearance of right hip prosthesis.  No evidence of fracture.   Original Report Authenticated By: Myles Rosenthal, M.D.    They benefited maximally from their hospital stay and there were no complications.     Disposition: 03-Skilled Nursing Facility      Follow-up Information   Schedule an appointment as soon as possible for a visit with Sharma Covert, MD.   Contact information:   [address] [place] [address] [phone]       Wrist instructions: No weight on right wrist ok to use platform walker Keep bandage clean/dry F/u in my office in two weeks  Signed: Sharma Covert 09/12/2012, 7:34 AM

## 2012-09-12 NOTE — Progress Notes (Signed)
Courtesy note: Chart reivewed along with labs. Spoke with patient - she seems to be doing fine. She knows to come see me 7-10 after d/c from SNF

## 2012-09-12 NOTE — Progress Notes (Signed)
Patient DC'd via stretcher to [facility] in stable condition with copy of chart .  Report called to Supervisor of SNF.

## 2012-09-12 NOTE — Progress Notes (Signed)
PT Cancellation Note  Patient Details Name: KYLEA BERRONG MRN: [id number] DOB: 12-24-1915    Orders received, chart reviewed  Spoke with Suzanna, SW, who informed me the pt is ready to transfer back to SNF  All further PT needs can be addressed at [facility]  Thanks, Van Clines, [place] [phone]    Van Clines [facility] 09/12/2012, 12:44 PM

## 2012-09-12 NOTE — Progress Notes (Signed)
CSW facilitated pt discharge needs including contacting facility, faxing pt discharge information to facility, discussing with pt and pt son at bedside, providing RN phone number to call report, and arranging non-emergency transport for pt back to [facility].   No further social work needs identified at this time.  CSW signing off.   Jacklynn Lewis, MSW, LCSWA  Clinical Social Work W/e coverage [phone]

## 2012-09-12 NOTE — Evaluation (Signed)
Occupational Therapy Evaluation Patient Details Name: [name] MRN: [id number] DOB: 01/28/1916 Today's Date: 09/12/2012 Time: 7829-5621 OT Time Calculation (min): 13 min  OT Assessment / Plan / Recommendation Clinical Impression  Pt s/p ORIF of right distal radius fx due to fall.  Pt to return to [facility] later today per SW note.  Education completed with both pt and son regarding elevation and edema control. Recommend pt return to SNF to further progress rehab.  Will sign off as pt to return to SNF later today.    OT Assessment  All further OT needs can be met in the next venue of care    Follow Up Recommendations  SNF;Supervision/Assistance - 24 hour    Barriers to Discharge      Equipment Recommendations   (defer to SNF)    Recommendations for Other Services    Frequency       Precautions / Restrictions Restrictions Weight Bearing Restrictions: Yes RUE Weight Bearing: Weight bear through elbow only RLE Weight Bearing: Weight bearing as tolerated   Pertinent Vitals/Pain See vitals   ADL  Eating/Feeding: Performed;Set up Where Assessed - Eating/Feeding: Chair ADL Comments: Educated pt and son on elevation and edema control.  Pt in chair upon OT arrival with only one pillow beneath RUE. Placed another pillow beneath RUE (to attain more elevated position).  Educated pt on moving fingers as much as possible in order to assist with edema control as well.  Pt HOH, but able to verbalize education received back to OT.        OT Diagnosis:    OT Problem List:   OT Treatment Interventions:     OT Goals    Visit Information  Last OT Received On: 09/12/12    Subjective Data      Prior Functioning     Home Living Available Help at Discharge: Skilled Nursing Facility Prior Function Comments: Pt has been at SNF for rehab and plans to return to SNF. Communication Communication: HOH Dominant Hand: Right         Vision/Perception     Cognition  Cognition Arousal/Alertness: Awake/alert Behavior During Therapy: WFL for tasks assessed/performed Overall Cognitive Status: Within Functional Limits for tasks assessed    Extremity/Trunk Assessment Right Upper Extremity Assessment RUE ROM/Strength/Tone: Unable to fully assess;Due to precautions;Deficits RUE ROM/Strength/Tone Deficits: Unable to fully test due to precautions and dressing.  Pt unable to achieve full digit ROM. Shoulder AROM WFL . Left Upper Extremity Assessment LUE ROM/Strength/Tone: WFL for tasks assessed     Mobility Bed Mobility Bed Mobility: Not assessed Transfers Transfers: Not assessed     Exercise     Balance     End of Session OT - End of Session Activity Tolerance: Patient tolerated treatment well Patient left: in chair;with call bell/phone within reach;with family/visitor present  GO    09/12/2012 Cipriano Mile OTR/L Pager [phone] Office [phone]  Cipriano Mile 09/12/2012, 1:13 PM

## 2012-09-12 NOTE — Progress Notes (Signed)
Clinical Social Work Department BRIEF PSYCHOSOCIAL ASSESSMENT 09/12/2012  Patient:  [name], [name]     Account Number:  [id number]     Admit date:  09/11/2012  Clinical Social Worker:  Jacelyn Grip  Date/Time:  09/12/2012 10:00 AM  Referred by:  Physician  Date Referred:  09/12/2012 Referred for  SNF Placement   Other Referral:   Interview type:  Patient Other interview type:    PSYCHOSOCIAL DATA Living Status:  FACILITY Admitted from facility:  [facility] Level of care:  Skilled Nursing Facility Primary support name:  [name] Saperstein/son Primary support relationship to patient:  CHILD, ADULT Degree of support available:   adequate    CURRENT CONCERNS Current Concerns  Post-Acute Placement   Other Concerns:    SOCIAL WORK ASSESSMENT / PLAN CSW received referral that pt admitted from [facility] and medically ready to return to SNF today.    CSW met with pt at bedside. Pt confirmed that she is currently a resident at [facility] and plans to return today.    CSW completed FL2 and contacted [facility] re: pt return to facility.    [facility] can accept pt back today and CSW awaiting for facility to notify CSW where to fax d/c information.    CSW to facilitate pt d/c needs this afternoon.   Assessment/plan status:  Psychosocial Support/Ongoing Assessment of Needs Other assessment/ plan:   discharge planning   Information/referral to community resources:   Referral back to [facility]    PATIENT'S/FAMILY'S RESPONSE TO PLAN OF CARE: Pt alert and oriented x 4. Pt was nervous that she would be returning back to facility right now and CSW explained that it would be later in the day that she would return to facility. Pt pleasant and cooperative and agreeable to return to [facility] today.     Jacklynn Lewis, MSW, LCSWA  Clinical Social Work W/e coverage [phone]

## 2012-09-14 ENCOUNTER — Encounter (HOSPITAL_COMMUNITY): Payer: Self-pay | Admitting: Orthopedic Surgery

## 2012-10-07 ENCOUNTER — Other Ambulatory Visit: Payer: Self-pay | Admitting: Geriatric Medicine

## 2012-10-07 MED ORDER — HYDROCODONE-ACETAMINOPHEN 5-325 MG PO TABS: 1.0000 | ORAL_TABLET | Freq: Four times a day (QID) | ORAL | Status: DC | PRN

## 2012-10-18 ENCOUNTER — Encounter: Payer: Self-pay | Admitting: Gynecology

## 2012-10-18 ENCOUNTER — Ambulatory Visit (INDEPENDENT_AMBULATORY_CARE_PROVIDER_SITE_OTHER): Payer: Medicare Other | Admitting: Gynecology

## 2012-10-18 VITALS — BP 124/78 | Ht 67.0 in | Wt 160.0 lb

## 2012-10-18 DIAGNOSIS — N390 Urinary tract infection, site not specified: Secondary | ICD-10-CM

## 2012-10-18 DIAGNOSIS — N952 Postmenopausal atrophic vaginitis: Secondary | ICD-10-CM

## 2012-10-18 DIAGNOSIS — R32 Unspecified urinary incontinence: Secondary | ICD-10-CM

## 2012-10-18 LAB — URINALYSIS W MICROSCOPIC + REFLEX CULTURE
Bilirubin Urine: NEGATIVE
Casts: NONE SEEN
Crystals: NONE SEEN
Ketones, ur: NEGATIVE mg/dL
Specific Gravity, Urine: 1.01 (ref 1.005–1.030)
Urobilinogen, UA: 0.2 mg/dL (ref 0.0–1.0)
pH: 6 (ref 5.0–8.0)

## 2012-10-18 NOTE — Patient Instructions (Signed)
Take oral antibiotics as prescribed. Apply vulvar cream twice daily for 2 weeks and then once at bedtime.

## 2012-10-18 NOTE — Progress Notes (Signed)
[name] 1915-08-20 [id number]        79 y.o.  G2P2002 new patient referred in consultation from Dr. Glade Lloyd at [facility]. For the past several months patient has noted vulvar/internal burning sensation. Pretty much constant. Some urinary incontinence although this is been a more chronic problem. History of abdominal hysterectomy/vesicovaginal fistula repair and bladder "tack" 1998. Most recently had fall with fractured hip and wrist. No overt GI symptoms or urinary symptoms such as frequency dysuria urgency. She is on Flomax and a number of other medications. Was started one month ago on Premarin vaginal cream in an attempt to help with the irritative/burning symptoms by Dr. Alvera Novel. Patient is hard of hearing and her daughter accompanied her and provided most of the history.   Past medical history,surgical history, medications, allergies, family history and social history were all reviewed and documented in the EPIC chart.  ROS:  Performed and pertinent positives and negatives are included in the history, assessment and plan .  Exam: Kim assistant Filed Vitals:   10/18/12 0836  BP: 124/78  Height: 5\' 7"  (1.702 m)  Weight: 160 lb (72.576 kg)   General appearance  Normal for patient in her 90s. Well oriented to her surroundings and situation. Skin grossly normal Head/Neck grossly normal Lungs  clear Abdominal  soft, nontender, without masses, organomegaly or hernia Spine straight without CVA tenderness Pelvic  Ext/BUS/vagina  atrophic changes with cuff/bladder well supported. No cystocele or significant rectocele.  Adnexa  Without masses or tenderness    Anus and perineum  normal   Rectovaginal  normal sphincter tone without palpated masses or tenderness.    Assessment/Plan:  79 y.o. G48P2002 female complaining of vulvar/internal burning type sensation. Present over the past several months. Trial of Premarin cream in the last month ineffective.  Urinalysis today consistent with UTI showing TNTC WBC many bacteria. Exam consistent with atrophic changes but no specific lesions/discharge. Recommend initiating oral antibiotics for UTI. I wrote a recommendation on a consultation sheet to Dr. Glade Lloyd for ciprofloxacin 250 mg twice a day x7 days as long as no contraindication. Also wrote for Mytrex cream to apply externally twice daily for 2 weeks and then nightly to see if this doesn't help for some external burning which may be a combination of fungal versus chemical from her urine. Would stop the Premarin cream as it does not appear to be helping her and followup as needed.   Note: This document was prepared with digital dictation and possible smart phrase technology. Any transcriptional errors that result from this process are unintentional.   Dara Lords MD, 9:46 AM 10/18/2012

## 2012-10-18 NOTE — Addendum Note (Signed)
Addended by: Dayna Barker on: 10/18/2012 10:14 AM   Modules accepted: Orders

## 2012-10-21 ENCOUNTER — Telehealth: Payer: Self-pay

## 2012-10-21 NOTE — Telephone Encounter (Signed)
Calling to ask should patient d/c the Premarin cream since Dr. Velvet Bathe prescribed Mytrex cream last week.  I told her that Dr. Kristie Cowman note did indicate that she should d/c it since it has been ineffective.

## 2012-10-25 ENCOUNTER — Emergency Department (HOSPITAL_COMMUNITY)
Admission: EM | Admit: 2012-10-25 | Discharge: 2012-10-25 | Disposition: A | Discharge status: Home / Self Care | Payer: Medicare Other | Attending: Emergency Medicine | Admitting: Emergency Medicine

## 2012-10-25 ENCOUNTER — Encounter (HOSPITAL_COMMUNITY): Payer: Self-pay | Admitting: Emergency Medicine

## 2012-10-25 ENCOUNTER — Non-Acute Institutional Stay (SKILLED_NURSING_FACILITY): Payer: Medicare Other | Admitting: Internal Medicine

## 2012-10-25 DIAGNOSIS — I10 Essential (primary) hypertension: Secondary | ICD-10-CM | POA: Insufficient documentation

## 2012-10-25 DIAGNOSIS — N952 Postmenopausal atrophic vaginitis: Secondary | ICD-10-CM

## 2012-10-25 DIAGNOSIS — Z79899 Other long term (current) drug therapy: Secondary | ICD-10-CM | POA: Insufficient documentation

## 2012-10-25 DIAGNOSIS — K219 Gastro-esophageal reflux disease without esophagitis: Secondary | ICD-10-CM | POA: Insufficient documentation

## 2012-10-25 DIAGNOSIS — N949 Unspecified condition associated with female genital organs and menstrual cycle: Secondary | ICD-10-CM

## 2012-10-25 DIAGNOSIS — N9489 Other specified conditions associated with female genital organs and menstrual cycle: Secondary | ICD-10-CM | POA: Insufficient documentation

## 2012-10-25 DIAGNOSIS — Z90710 Acquired absence of both cervix and uterus: Secondary | ICD-10-CM | POA: Insufficient documentation

## 2012-10-25 DIAGNOSIS — Z8742 Personal history of other diseases of the female genital tract: Secondary | ICD-10-CM | POA: Insufficient documentation

## 2012-10-25 LAB — URINALYSIS, ROUTINE W REFLEX MICROSCOPIC
Glucose, UA: NEGATIVE mg/dL
Hgb urine dipstick: NEGATIVE
Ketones, ur: NEGATIVE mg/dL
Protein, ur: NEGATIVE mg/dL

## 2012-10-25 LAB — CBC WITH DIFFERENTIAL/PLATELET
Basophils Absolute: 0.1 10*3/uL (ref 0.0–0.1)
Basophils Relative: 1 % (ref 0–1)
Eosinophils Relative: 2 % (ref 0–5)
HCT: 35.3 % — ABNORMAL LOW (ref 36.0–46.0)
Lymphocytes Relative: 31 % (ref 12–46)
MCH: 29.9 pg (ref 26.0–34.0)
MCHC: 34 g/dL (ref 30.0–36.0)
MCV: 88 fL (ref 78.0–100.0)
Monocytes Absolute: 0.8 10*3/uL (ref 0.1–1.0)
RDW: 14.4 % (ref 11.5–15.5)

## 2012-10-25 LAB — BASIC METABOLIC PANEL
CO2: 27 mEq/L (ref 19–32)
Calcium: 9.5 mg/dL (ref 8.4–10.5)
Creatinine, Ser: 0.67 mg/dL (ref 0.50–1.10)

## 2012-10-25 LAB — CG4 I-STAT (LACTIC ACID): Lactic Acid, Venous: 0.95 mmol/L (ref 0.5–2.2)

## 2012-10-25 NOTE — ED Notes (Signed)
Results of lactic acid called to [place], RN

## 2012-10-25 NOTE — Progress Notes (Signed)
Patient ID: [name], female   DOB: 03-Dec-1915, 79 y.o.   MRN: [id number]  Chief Complaint  Patient presents with  . Vaginal Itching    HPI- 79 y/o female patient here for STR was seen today with complaints of vaginal itching and burning. She was recently diagnosed with UTI and is on treatment with ciprofloxacin. She also saw gyn last week for this. Her premarin was discontinued and she has been started on mytrex cream. She would like to go the ED to be evaluated  Ros Negative for fever or chills No chest pain or dyspnea No nausea or vomiting No abdominal pain No hematuria  Allergies  Allergen Reactions  . Aleve (Naproxen Sodium) Swelling    Legs and feet   Exam- BP 143/62  Pulse 87  Temp(Src) 98.5 F (36.9 C)  Resp 18  SpO2 96%  gen- elderly female hard of hearing, in NAD, need to write on white board to converse HEENT- no pallor or icterus, no LAD, mmm cvs- n s1,s2, rrr respi- CTAB Abdomen- bs+, soft, non tender, no suprapubic or flank tenderness Genital-Dry, atrophic, redness present, thin paste of the cream on her labia, no discharge Neuro- aaox 3, anxious  Assessment/plan-  Atrophic vaginitis- She is having these symptoms in setting of her atrophic vaginitis.  Explained that we could set a follow up with Gyn and she will need to use the medication for atleast 2 weeks to see desired result, but pt refuses. She insits she wants to go to ED. This is not an emergency reason to go the ED but pt is being sent to the ED as per patient request   Spent more tha 30 minutes with the patient this visit

## 2012-10-25 NOTE — ED Notes (Signed)
PTAR called  

## 2012-10-25 NOTE — ED Notes (Addendum)
Pt to ED from [facility] nursing home for evaluation of vaginal irritation.  Pt states that she is unable to sit due to the irritation.  Was given vaginal cream last Monday per PCP and told it would take a month to work- pt states she cannot tolerate the irritation.

## 2012-10-25 NOTE — ED Provider Notes (Signed)
CSN: [id number]     Arrival date & time 10/25/12  1201 History     First MD Initiated Contact with Patient 10/25/12 1219     Chief Complaint  Patient presents with  . Vaginal Bleeding    HPI  Pt is a 79 yo Caucasian G73P2002 female s/p hysterectomy and vesicovaginal fistula repair (1998) with pmh HTN, HL, GERD, arthritis, hiatal hernia, recent hip fracture, and UTI s/p 7 day course of cipro who presents with vaginal burning that has been ongoing since mid February. Pt reports symptoms of vaginal burning, urinary frequency, suprapubic pain, and feeling of incomplete voiding began mid-February and have been constant since then. No vaginal itching, discharge, or bleeding. She reports no fevers, rigors, nausea, vomiting, flank pain, or hematuria. She is incontinent and wears a diaper. She currently resides  in a rehab facility s/p hip fracture beginning of June. She was prescribed premarin vaginal cream for a month which did alleviate her symptoms and recently switched to mytrex cream which she has been using x3 week. She is also on flomax, cranberry probiotic, and lasix.  Recently she was seen in clinic (7/21) and found to have UTI with >100K E. Coli and placed on 7 day course of cipro which she has since completed.      Past Medical History  Diagnosis Date  . History of hiatal hernia   . Mitral valve prolapse   . Plantar fasciitis   . Hay fever   . GERD (gastroesophageal reflux disease)   . Palpitations   . Hypertension   . Neuromuscular disorder   . Deafness     "very HOH; even w/hearing aides we have to communicate via writing things down for her to read" (08/25/2012)  . H/O hiatal hernia   . Arthritis     "a little all over" (08/25/2012)  . Lung mass     Hx: of  . Anxiety    Past Surgical History  Procedure Laterality Date  . Foot surgery Bilateral ~ 2000    "pinched nerve on feet" (08/25/2012)  . Vesicovaginal fistula closure w/ tah    . Abdominal hysterectomy  1998  .  Tonsillectomy and adenoidectomy  1940's  . Cesarean section  1952  . Knee arthroscopy w/ meniscectomy Left 07/15/2010    partial medial and lateral meniscectomies.Hattie Perch 07/15/2010 (08/25/2012)  . Hip arthroplasty Right 08/26/2012    Procedure: ARTHROPLASTY BIPOLAR HIP;  Surgeon: Shelda Pal, MD;  Location: [facility] OR;  Service: Orthopedics;  Laterality: Right;  . Open reduction internal fixation (orif) distal radial fracture Right 09/11/2012    Procedure: OPEN REDUCTION INTERNAL FIXATION (ORIF) RIGHT DISTAL RADIAL FRACTURE/ULNAR POSSIBLE BONE GRAFTING;  Surgeon: Sharma Covert, MD;  Location: [facility] OR;  Service: Orthopedics;  Laterality: Right;   Family History  Problem Relation Age of Onset  . Colon cancer Mother   . Colon cancer Sister   . Colon cancer Brother    History  Substance Use Topics  . Smoking status: Never Smoker   . Smokeless tobacco: Never Used  . Alcohol Use: No   OB History   Grav Para Term Preterm Abortions TAB SAB Ect Mult Living   2 2 2       2      Review of Systems  Constitutional: Negative for fever, chills, fatigue and unexpected weight change.  Respiratory: Negative for cough and shortness of breath.   Cardiovascular: Negative for chest pain, palpitations and leg swelling.  Gastrointestinal: Positive for nausea and abdominal pain.  Negative for vomiting, diarrhea, constipation, blood in stool and anal bleeding.  Genitourinary: Positive for dysuria and frequency. Negative for hematuria, flank pain, vaginal bleeding and vaginal discharge.  Hematological: Does not bruise/bleed easily.  Psychiatric/Behavioral: Negative for confusion.    Allergies  Aleve  Home Medications   Current Outpatient Rx  Name  Route  Sig  Dispense  Refill  . bisacodyl (BISCOLAX) 10 MG suppository   Rectal   Place 10 mg rectally daily as needed for constipation.         . Calcium Glycerophosphate (PRELIEF PO)   Oral   Take 1-2 tablets by mouth daily as needed (as needed for  indigestion).          . conjugated estrogens (PREMARIN) vaginal cream   Vaginal   Place 1 g vaginally daily.         . Cranberry-Vitamin C-Probiotic (AZO CRANBERRY PO)   Oral   Take 1 tablet by mouth 4 (four) times daily.         . ferrous sulfate 325 (65 FE) MG tablet   Oral   Take 325 mg by mouth 3 (three) times daily with meals.         . furosemide (LASIX) 40 MG tablet   Oral   Take 1 tablet (40 mg total) by mouth daily.   30 tablet   11   . gabapentin (NEURONTIN) 100 MG capsule   Oral   Take 1 capsule (100 mg total) by mouth 2 (two) times daily as needed (for continuous pain).         Marland Kitchen HYDROcodone-acetaminophen (NORCO/VICODIN) 5-325 MG per tablet   Oral   Take 1-2 tablets by mouth every 6 (six) hours as needed for pain.   240 tablet   5   . losartan (COZAAR) 50 MG tablet   Oral   Take 50 mg by mouth daily.         Marland Kitchen menthol-cetylpyridinium (CEPACOL) 3 MG lozenge   Oral   Take 1 lozenge by mouth daily as needed (for sore throat pain).         . methocarbamol (ROBAXIN) 500 MG tablet   Oral   Take 500 mg by mouth every 6 (six) hours as needed (for muscle spasms).         . metoprolol succinate (TOPROL-XL) 100 MG 24 hr tablet   Oral   Take 100 mg by mouth daily. Take with or immediately following a meal.         . nystatin-triamcinolone (MYCOLOG II) cream   Topical   Apply 1 application topically See admin instructions. Apply externally to vulva twice daily for two weeks (starting 10/18/12 and ending 11/01/12) Apply every night at bedtime to external vulva after completing two week twice daily treatment.         Marland Kitchen omeprazole (PRILOSEC) 20 MG capsule   Oral   Take 1 capsule (20 mg total) by mouth daily.         . polyethylene glycol (MIRALAX / GLYCOLAX) packet   Oral   Take 17 g by mouth daily.         Bernadette Hoit Sodium (SENNA S PO)   Oral   Take 1 tablet by mouth 2 (two) times daily as needed (for constipation, hold for  greater than 2 bowel movements in a day).         . tamsulosin (FLOMAX) 0.4 MG CAPS   Oral   Take 1 capsule (0.4 mg total) by  mouth daily.   30 capsule      . vitamin C (ASCORBIC ACID) 500 MG tablet   Oral   Take 1 tablet (500 mg total) by mouth daily.   90 tablet   0    BP 131/56  Pulse 73  Temp(Src) 98.3 F (36.8 C) (Oral)  Resp 20  SpO2 97% Physical Exam  Constitutional: She is oriented to person, place, and time. She appears well-developed and well-nourished. No distress.  HENT:  Head: Normocephalic and atraumatic.  Eyes: EOM are normal.  Neck: Normal range of motion. Neck supple.  Cardiovascular: Normal rate, regular rhythm and normal heart sounds.   Pulmonary/Chest: Effort normal and breath sounds normal. No respiratory distress. She has no wheezes. She has no rales. She exhibits no tenderness.  Abdominal: Soft. Bowel sounds are normal. She exhibits no distension. There is tenderness (suprapubic tenderness). There is no rebound and no guarding.  Genitourinary: Vagina normal.  Musculoskeletal: Normal range of motion. She exhibits no edema and no tenderness.  Neurological: She is alert and oriented to person, place, and time.  Skin: Skin is warm and dry. No rash noted. She is not diaphoretic. No pallor.  Psychiatric: She has a normal mood and affect. Her behavior is normal.    ED Course   Procedures (including critical care time)  Labs Reviewed  CBC WITH DIFFERENTIAL - Abnormal; Notable for the following:    HCT 35.3 (*)    All other components within normal limits  BASIC METABOLIC PANEL - Abnormal; Notable for the following:    Glucose, Bld 119 (*)    GFR calc non Af Amer 73 (*)    GFR calc Af Amer 85 (*)    All other components within normal limits  URINALYSIS, ROUTINE W REFLEX MICROSCOPIC  CG4 I-STAT (LACTIC ACID)   No results found. 1. Vaginal burning     MDM  Assessment: 79 yo Caucasian G2P2002 female s/p hysterectomy and vesicovaginal fistula  repair (1998) with pmh HTN, HL, GERD, arthritis, hiatal hernia, recent hip fracture, and UTI s/p 7 day course of cipro who presents with vaginal burning that has been ongoing since mid February.        Plan:  Vaginal burning and urinary frequency -  Vaginitis vs vaginal atrophy vs UTI (cystitis vs pyelonephritis) vs vulvar cancer -Obtain UA w/culture --> WNL -Obtain CBC w/ dif --> WNL -Obtain BMP ---> WNL, mild hyperglycemia  -Obtain lactate level ---> WNL -Consult obgyn   Disposition: Rehab facility ---> Pt is afebrile without leukocytosis and normal UA s/p 7 day course of cipro. Visual inspection of vagina did not reveal discharge, edema, erythema, or fungal growth. Etiology of vaginal burning for past 4 months unclear, most likely due to vaginal atrophy with recurrent irritation due to urinary incontinence (wears diaper). Also at rehab facility and proper hygiene may not be followed. Spoke with her obgyn Dr Audie Box that recommended use of mycolog cream twice a day for 2 weeks (she was using it x3 weekly) and if it does not improve follow-up with possible restarting of premarin cream.        Discharge Instructions:  -To use mycology cream BID for 2 weeks -To follow-up with Dr. Audie Box in 2 weeks         Otis Brace, MD 10/25/12 2304

## 2012-10-27 NOTE — ED Provider Notes (Signed)
I saw and evaluated the patient with the resident physician. Patient was awake alert, though with  per history of dementia she was perseverant, with little insight into her condition. On exam she was afebrile, in no distress.  With her description of a vaginal pain, we did physical exam, which did not demonstrate overt abnormality.  The patient's history of flank incontinence suggests dermatitis likely secondary to constant urine irritation.  Patient is appropriate for discharge after we discussed her case with her gynecological team.    Gerhard Munch, MD 10/27/12 [phone]

## 2012-10-28 ENCOUNTER — Non-Acute Institutional Stay (SKILLED_NURSING_FACILITY): Payer: Medicare Other | Admitting: Adult Health

## 2012-10-28 ENCOUNTER — Encounter: Payer: Self-pay | Admitting: Adult Health

## 2012-10-28 DIAGNOSIS — D509 Iron deficiency anemia, unspecified: Secondary | ICD-10-CM

## 2012-10-28 DIAGNOSIS — R3911 Hesitancy of micturition: Secondary | ICD-10-CM

## 2012-10-28 DIAGNOSIS — K5900 Constipation, unspecified: Secondary | ICD-10-CM

## 2012-10-28 DIAGNOSIS — R609 Edema, unspecified: Secondary | ICD-10-CM

## 2012-10-28 DIAGNOSIS — S72009S Fracture of unspecified part of neck of unspecified femur, sequela: Secondary | ICD-10-CM

## 2012-10-28 DIAGNOSIS — I10 Essential (primary) hypertension: Secondary | ICD-10-CM

## 2012-10-28 DIAGNOSIS — S72141S Displaced intertrochanteric fracture of right femur, sequela: Secondary | ICD-10-CM

## 2012-10-28 DIAGNOSIS — N952 Postmenopausal atrophic vaginitis: Secondary | ICD-10-CM

## 2012-10-28 DIAGNOSIS — K219 Gastro-esophageal reflux disease without esophagitis: Secondary | ICD-10-CM

## 2012-10-28 NOTE — Assessment & Plan Note (Signed)
Will continue her iron and will monitor her status

## 2012-10-28 NOTE — Assessment & Plan Note (Signed)
Is stable will continue lasix 40 mg daily  

## 2012-10-28 NOTE — Assessment & Plan Note (Signed)
Will continue miralax daily and senna s twice daily

## 2012-10-28 NOTE — Assessment & Plan Note (Signed)
Will continue prilosec 20 mg daily  

## 2012-10-28 NOTE — Assessment & Plan Note (Signed)
Will continue flomax 0.4 mg daily

## 2012-10-28 NOTE — Assessment & Plan Note (Signed)
Will continue toprol xl 100 mg daily; cozaar 50 mg daily and will monitor

## 2012-10-28 NOTE — Assessment & Plan Note (Signed)
She is stable continues with therapy as directed; will continue robaxin 500 mg every 6 hours as needed and vicodin 5/325 mg 1-2 tabs every 6 hours as needed and will monitor

## 2012-10-28 NOTE — Assessment & Plan Note (Signed)
No change in her status has been seen by gyn; her estrogen cream was stopped is using mytrex cream twice daily; will not make changes and will monitor her status

## 2012-10-28 NOTE — Progress Notes (Signed)
Patient ID: [name], female   DOB: 1916-02-16, 79 y.o.   MRN: [id number]  [facility]  Allergies  Allergen Reactions  . Aleve (Naproxen Sodium) Swelling    Legs and feet     Chief Complaint  Patient presents with  . Medical Managment of Chronic Issues    HPI: She is being seen for the management of her chronic illnesses. She has chronic vaginal itching present has been seen by gyn. She is doing well otherwise there are no concerns being voiced by the nursing staff at this time.   Past Medical History  Diagnosis Date  . History of hiatal hernia   . Mitral valve prolapse   . Plantar fasciitis   . Hay fever   . GERD (gastroesophageal reflux disease)   . Palpitations   . Hypertension   . Neuromuscular disorder   . Deafness     "very HOH; even w/hearing aides we have to communicate via writing things down for her to read" (08/25/2012)  . H/O hiatal hernia   . Arthritis     "a little all over" (08/25/2012)  . Lung mass     Hx: of  . Anxiety     Past Surgical History  Procedure Laterality Date  . Foot surgery Bilateral ~ 2000    "pinched nerve on feet" (08/25/2012)  . Vesicovaginal fistula closure w/ tah    . Abdominal hysterectomy  1998  . Tonsillectomy and adenoidectomy  1940's  . Cesarean section  1952  . Knee arthroscopy w/ meniscectomy Left 07/15/2010    partial medial and lateral meniscectomies.Hattie Perch 07/15/2010 (08/25/2012)  . Hip arthroplasty Right 08/26/2012    Procedure: ARTHROPLASTY BIPOLAR HIP;  Surgeon: Shelda Pal, MD;  Location: [facility] OR;  Service: Orthopedics;  Laterality: Right;  . Open reduction internal fixation (orif) distal radial fracture Right 09/11/2012    Procedure: OPEN REDUCTION INTERNAL FIXATION (ORIF) RIGHT DISTAL RADIAL FRACTURE/ULNAR POSSIBLE BONE GRAFTING;  Surgeon: Sharma Covert, MD;  Location: [facility] OR;  Service: Orthopedics;  Laterality: Right;    VITAL SIGNS BP 127/68  Pulse 74  Ht 5\' 4"  (1.626 m)  Wt 161 lb (73.029 kg)  BMI  27.62 kg/m2   Patient's Medications  New Prescriptions   No medications on file  Previous Medications   BISACODYL (BISCOLAX) 10 MG SUPPOSITORY    Place 10 mg rectally daily as needed for constipation.   CALCIUM GLYCEROPHOSPHATE (PRELIEF PO)    Take 1-2 tablets by mouth daily as needed (as needed for indigestion).    CRANBERRY-VITAMIN C-PROBIOTIC (AZO CRANBERRY PO)    Take 1 tablet by mouth 4 (four) times daily.   FERROUS SULFATE 325 (65 FE) MG TABLET    Take 325 mg by mouth 3 (three) times daily with meals.   FUROSEMIDE (LASIX) 40 MG TABLET    Take 1 tablet (40 mg total) by mouth daily.   GABAPENTIN (NEURONTIN) 100 MG CAPSULE    Take 1 capsule (100 mg total) by mouth 2 (two) times daily as needed (for continuous pain).   HYDROCODONE-ACETAMINOPHEN (NORCO/VICODIN) 5-325 MG PER TABLET    Take 1-2 tablets by mouth every 6 (six) hours as needed for pain.   LOSARTAN (COZAAR) 50 MG TABLET    Take 50 mg by mouth daily.   MENTHOL-CETYLPYRIDINIUM (CEPACOL) 3 MG LOZENGE    Take 1 lozenge by mouth daily as needed (for sore throat pain).   METHOCARBAMOL (ROBAXIN) 500 MG TABLET    Take 500 mg by mouth every 6 (  six) hours as needed (for muscle spasms).   METOPROLOL SUCCINATE (TOPROL-XL) 100 MG 24 HR TABLET    Take 100 mg by mouth daily. Take with or immediately following a meal.   NYSTATIN-TRIAMCINOLONE (MYCOLOG II) CREAM    Apply 1 application topically See admin instructions. Apply externally to vulva twice daily for two weeks (starting 10/18/12 and ending 11/01/12) Apply every night at bedtime to external vulva after completing two week twice daily treatment.   OMEPRAZOLE (PRILOSEC) 20 MG CAPSULE    Take 1 capsule (20 mg total) by mouth daily.   POLYETHYLENE GLYCOL (MIRALAX / GLYCOLAX) PACKET    Take 17 g by mouth daily.   SENNOSIDES-DOCUSATE SODIUM (SENNA S PO)    Take 1 tablet by mouth 2 (two) times daily.    TAMSULOSIN (FLOMAX) 0.4 MG CAPS    Take 1 capsule (0.4 mg total) by mouth daily.   VITAMIN C  (ASCORBIC ACID) 500 MG TABLET    Take 1 tablet (500 mg total) by mouth daily.  Modified Medications   No medications on file  Discontinued Medications   CONJUGATED ESTROGENS (PREMARIN) VAGINAL CREAM    Place 1 g vaginally daily.    SIGNIFICANT DIAGNOSTIC EXAMS   LABS REVIEWED:   09-01-12: wbc 13.9; hgb 10.9; hct 33.8; mcv 94.7; plt 308; glucose 115; bun 21; creat 0.6; k+3.7;na++131 Urine culture k. Pneumoniae: augmentin  09-17-12: urine culture: no growth 10-25-12: wbc 7.8; hgb 12.0; hct 35.3 ;mcv 88.0 plt 276; glucose 119; bun 9; creat 0.67; k+3.8na++136  Review of Systems  Constitutional: Negative for malaise/fatigue.  Respiratory: Negative for cough and sputum production.   Cardiovascular: Negative for chest pain, palpitations and leg swelling.  Gastrointestinal: Negative for heartburn, abdominal pain and constipation.  Genitourinary: Negative for dysuria.       Has vaginal itching and dryness  Musculoskeletal: Negative for myalgias and back pain.  Skin: Negative.   Neurological: Negative for headaches.  Psychiatric/Behavioral: Negative for depression. The patient does not have insomnia.     Physical Exam  Constitutional: She is oriented to person, place, and time. She appears well-developed and well-nourished.  overweight  Neck: Neck supple. No JVD present. No thyromegaly present.  Cardiovascular: Normal rate, regular rhythm and intact distal pulses.   Respiratory: Effort normal. No respiratory distress. She has no wheezes.  GI: Bowel sounds are normal. She exhibits no distension. There is no tenderness.  Musculoskeletal: Normal range of motion. She exhibits no edema.  Neurological: She is alert and oriented to person, place, and time.  Skin: Skin is warm and dry.      ASSESSMENT/ PLAN:  Atrophic vaginitis No change in her status has been seen by gyn; her estrogen cream was stopped is using mytrex cream twice daily; will not make changes and will monitor her status    Anemia, iron deficiency Will continue her iron and will monitor her status   GERD Will continue prilosec 20 mg daily   HYPERTENSION Will continue toprol xl 100 mg daily; cozaar 50 mg daily and will monitor  Unspecified constipation Will continue miralax daily and senna s twice daily   Edema Is stable will continue lasix 40 mg daily   Urinary hesitancy Will continue flomax 0.4 mg daily   Intertrochanteric fracture of right hip She is stable continues with therapy as directed; will continue robaxin 500 mg every 6 hours as needed and vicodin 5/325 mg 1-2 tabs every 6 hours as needed and will monitor     Time spent with  patient 50 minutes

## 2012-11-02 ENCOUNTER — Encounter: Payer: Self-pay | Admitting: Gynecology

## 2012-11-02 ENCOUNTER — Telehealth: Payer: Self-pay | Admitting: *Deleted

## 2012-11-02 ENCOUNTER — Ambulatory Visit (INDEPENDENT_AMBULATORY_CARE_PROVIDER_SITE_OTHER): Payer: Medicare Other | Admitting: Gynecology

## 2012-11-02 DIAGNOSIS — N762 Acute vulvitis: Secondary | ICD-10-CM

## 2012-11-02 DIAGNOSIS — N952 Postmenopausal atrophic vaginitis: Secondary | ICD-10-CM

## 2012-11-02 DIAGNOSIS — N760 Acute vaginitis: Secondary | ICD-10-CM

## 2012-11-02 NOTE — Telephone Encounter (Signed)
Left message for pt to call.

## 2012-11-02 NOTE — Patient Instructions (Signed)
Used Temovate 0.05% cream twice daily for 2 weeks externally and then nightly for one month. Followup if your symptoms continue. Office will arrange urology appointment. Have the rehabilitation facility send off a urine culture.

## 2012-11-02 NOTE — Telephone Encounter (Signed)
Message copied by Aura Camps on Tue Nov 02, 2012  2:30 PM ------      Message from: Dara Lords      Created: Tue Nov 02, 2012 11:04 AM       Call patient's rehabilitation center.  I want her started on Temovate 0.05% cream twice daily for 2 weeks and then nightly for one month external vulvar application. I also want them to send a urine culture if they can. Lastly we need to call make an appointment for urology reference incontinence/bladder pain and let the facility know the date and time. ------

## 2012-11-02 NOTE — Progress Notes (Signed)
Patient presents in followup having recently been evaluated 10/18/2012 for vulvar irritation and burning. Was found to have a urinary tract infection and was treated with ciprofloxacin. Started on Mytrex cream for the vulvar irritation. Had been on Premarin cream since March through her primary physician without benefit. Still complains of vulvar irritation and "bladder" pressure.  Exam was Berenice Bouton External BUS vagina with atrophic changes. No overt evidence of lichen sclerosis or any other abnormalities. Scant discharge.  Assessment and plan: Discussed with patient and her daughter  Not sure if it just has not had long enough time to respond to the Mytrex. Does not appear to be atrophic as did not respond to months of Premarin cream. No overt evidence of lichen sclerosis but biopsy necessary discussed. Ultimately I recommended that we try Temovate 0.05% cream twice daily for 2 weeks and then nightly for one month to see if we can't alleviate her symptoms. They asked for a urology referral for her bladder given the ongoing symptoms. Unable to leave a urinalysis today. I've asked that they follow up at her facility and asked for a urine culture to be sent from there. They will also followup to make sure that the physician there prescribed the Temovate 0.05% cream as I am unable to put that through. We will go ahead and set up a urology appointment due to her bladder and incontinence complaints.

## 2012-11-05 ENCOUNTER — Telehealth: Payer: Self-pay

## 2012-11-05 NOTE — Telephone Encounter (Signed)
Daughter informed with all the below note.

## 2012-11-05 NOTE — Telephone Encounter (Signed)
appt with Dr.MacDiarmid on 11/23/12 @ 1:45 pm notes faxed left message for pt daughter Venita Sheffield to call.

## 2012-11-05 NOTE — Telephone Encounter (Signed)
Rx for the below was faxed to [facility] place there # is [phone]. Per wanda nurse I was told to fax Rx for pt.

## 2012-11-09 ENCOUNTER — Ambulatory Visit (INDEPENDENT_AMBULATORY_CARE_PROVIDER_SITE_OTHER): Payer: Medicare Other | Admitting: Podiatry

## 2012-11-09 ENCOUNTER — Ambulatory Visit: Payer: Medicare Other | Admitting: Podiatry

## 2012-11-09 DIAGNOSIS — B351 Tinea unguium: Secondary | ICD-10-CM

## 2012-11-09 DIAGNOSIS — M25571 Pain in right ankle and joints of right foot: Secondary | ICD-10-CM

## 2012-11-09 DIAGNOSIS — M25579 Pain in unspecified ankle and joints of unspecified foot: Secondary | ICD-10-CM

## 2012-11-09 NOTE — Progress Notes (Signed)
Subjective:  79 y.o. year old female patient presents stating that she got broken wrist on right in June. She was in hospital and was receiving rehabilitation therapy. Stated that her right foot is sore at the great toe nail and at the base of lesser digitis.  Patient has hard of hearing and uses writing board.   Patient Summary List & History reviewed for allergies, medications, medical problems and surgical history.   Objective: Dermatologic:  Positive for porokeratotic lesion sub 5 right.  All nails are hypertrophic and elongated.  Cryptotic nail both great toe medial border symptomatic.  Vascular:  Dorsalis pedis arteries are not palpable on both.  Posterior tibial pulses are palpable on both.  Orthopedic:  Rectus foot with mild digital contracture right foot.  Neurologic:  Decreased sensory perception to Monofilament sensory testing bilateral.  Normal on Vibratory and ankle DTR bilateral.  Assessment:  Dystrophic mycotic nails x 10.  Ingrown nail both great toes.  Peripheral neuropathy.   Treatment: All mycotic nails, corns, calluses debrided.  Return in 3 months or as needed.

## 2012-11-10 ENCOUNTER — Other Ambulatory Visit: Payer: Self-pay

## 2012-11-23 ENCOUNTER — Non-Acute Institutional Stay (SKILLED_NURSING_FACILITY): Payer: Medicare Other | Admitting: Adult Health

## 2012-11-23 DIAGNOSIS — I10 Essential (primary) hypertension: Secondary | ICD-10-CM

## 2012-11-23 DIAGNOSIS — G709 Myoneural disorder, unspecified: Secondary | ICD-10-CM

## 2012-11-23 DIAGNOSIS — S72009S Fracture of unspecified part of neck of unspecified femur, sequela: Secondary | ICD-10-CM

## 2012-11-23 DIAGNOSIS — S72141S Displaced intertrochanteric fracture of right femur, sequela: Secondary | ICD-10-CM

## 2012-11-26 NOTE — Progress Notes (Signed)
Late entry- OT evaluation addendum  09/12/12 1300  OT Time Calculation  OT Start Time 1112  OT Stop Time 1125  OT Time Calculation (min) 13 min  OT G-codes **NOT FOR INPATIENT CLASS**  Functional Assessment Tool Used clinical judgement  Functional Limitation Self care  Self Care Current Status (Z6109) CK  Self Care Goal Status (U0454) CK  Self Care Discharge Status (U9811) CK  OT General Charges  $OT Visit 1 Procedure  OT Evaluation  $Initial OT Evaluation Tier I 1 Procedure  OT Treatments  $Self Care/Home Management  8-22 mins   Late entry from eval on 09/12/12. 11/26/2012 Cipriano Mile OTR/L Pager [phone] Office [phone]

## 2012-11-28 ENCOUNTER — Emergency Department (HOSPITAL_COMMUNITY)
Admission: EM | Admit: 2012-11-28 | Discharge: 2012-11-28 | Disposition: A | Discharge status: Home / Self Care | Payer: Medicare Other | Attending: Emergency Medicine | Admitting: Emergency Medicine

## 2012-11-28 ENCOUNTER — Encounter (HOSPITAL_COMMUNITY): Payer: Self-pay | Admitting: Emergency Medicine

## 2012-11-28 DIAGNOSIS — Z888 Allergy status to other drugs, medicaments and biological substances status: Secondary | ICD-10-CM | POA: Insufficient documentation

## 2012-11-28 DIAGNOSIS — N898 Other specified noninflammatory disorders of vagina: Secondary | ICD-10-CM

## 2012-11-28 DIAGNOSIS — I059 Rheumatic mitral valve disease, unspecified: Secondary | ICD-10-CM | POA: Insufficient documentation

## 2012-11-28 DIAGNOSIS — H9190 Unspecified hearing loss, unspecified ear: Secondary | ICD-10-CM | POA: Insufficient documentation

## 2012-11-28 DIAGNOSIS — J309 Allergic rhinitis, unspecified: Secondary | ICD-10-CM | POA: Insufficient documentation

## 2012-11-28 DIAGNOSIS — R911 Solitary pulmonary nodule: Secondary | ICD-10-CM | POA: Insufficient documentation

## 2012-11-28 DIAGNOSIS — K219 Gastro-esophageal reflux disease without esophagitis: Secondary | ICD-10-CM | POA: Insufficient documentation

## 2012-11-28 DIAGNOSIS — M129 Arthropathy, unspecified: Secondary | ICD-10-CM | POA: Insufficient documentation

## 2012-11-28 DIAGNOSIS — F411 Generalized anxiety disorder: Secondary | ICD-10-CM | POA: Insufficient documentation

## 2012-11-28 DIAGNOSIS — G709 Myoneural disorder, unspecified: Secondary | ICD-10-CM | POA: Insufficient documentation

## 2012-11-28 DIAGNOSIS — N899 Noninflammatory disorder of vagina, unspecified: Secondary | ICD-10-CM | POA: Insufficient documentation

## 2012-11-28 DIAGNOSIS — I10 Essential (primary) hypertension: Secondary | ICD-10-CM | POA: Insufficient documentation

## 2012-11-28 DIAGNOSIS — Z8719 Personal history of other diseases of the digestive system: Secondary | ICD-10-CM | POA: Insufficient documentation

## 2012-11-28 DIAGNOSIS — Z79899 Other long term (current) drug therapy: Secondary | ICD-10-CM | POA: Insufficient documentation

## 2012-11-28 LAB — URINALYSIS, ROUTINE W REFLEX MICROSCOPIC
Bilirubin Urine: NEGATIVE
Ketones, ur: NEGATIVE mg/dL
Nitrite: NEGATIVE
pH: 7 (ref 5.0–8.0)

## 2012-11-28 LAB — COMPREHENSIVE METABOLIC PANEL
Albumin: 3.7 g/dL (ref 3.5–5.2)
BUN: 18 mg/dL (ref 6–23)
Calcium: 9.3 mg/dL (ref 8.4–10.5)
Creatinine, Ser: 0.9 mg/dL (ref 0.50–1.10)
Total Protein: 7 g/dL (ref 6.0–8.3)

## 2012-11-28 LAB — CBC WITH DIFFERENTIAL/PLATELET
Basophils Relative: 0 % (ref 0–1)
Eosinophils Absolute: 0.1 10*3/uL (ref 0.0–0.7)
Eosinophils Relative: 1 % (ref 0–5)
HCT: 40.2 % (ref 36.0–46.0)
Hemoglobin: 13.4 g/dL (ref 12.0–15.0)
MCH: 29.9 pg (ref 26.0–34.0)
MCHC: 33.3 g/dL (ref 30.0–36.0)
MCV: 89.7 fL (ref 78.0–100.0)
Monocytes Absolute: 1 10*3/uL (ref 0.1–1.0)
Monocytes Relative: 11 % (ref 3–12)
Neutrophils Relative %: 69 % (ref 43–77)

## 2012-11-28 LAB — LIPASE, BLOOD: Lipase: 38 U/L (ref 11–59)

## 2012-11-28 MED ORDER — CLOBETASOL PROPIONATE 0.05 % EX CREA: TOPICAL_CREAM | Freq: Two times a day (BID) | CUTANEOUS | Status: DC

## 2012-11-28 MED ORDER — ZINC OXIDE 40 % EX OINT: TOPICAL_OINTMENT | CUTANEOUS | Status: DC | PRN

## 2012-11-28 NOTE — ED Provider Notes (Signed)
CSN: [id number]     Arrival date & time 11/28/12  1019 History   First MD Initiated Contact with Patient 11/28/12 1118     Chief Complaint  Patient presents with  . Vaginal Pain   (Consider location/radiation/quality/duration/timing/severity/associated sxs/prior Treatment) HPI Comments: Patient presents with 6 months of vaginal burning and pain. She's been evaluated multiple times by gynecology in the ER with no relief of her symptoms. She states is the same pain she's had which is worse. She has been tried on nystatin cream as well as Premarin. She denies any abdominal pain, nausea, vomiting, fever or chills. She denies any dysuria hematuria. She denies any vaginal bleeding or discharge. The pain is not worse after urination. Denies any sexual activity. She denies any inappropriate touching. Patient is hard of hearing indicates by writing board.  The history is provided by the patient and a relative. The history is limited by a language barrier.    Past Medical History  Diagnosis Date  . History of hiatal hernia   . Mitral valve prolapse   . Plantar fasciitis   . Hay fever   . GERD (gastroesophageal reflux disease)   . Palpitations   . Hypertension   . Neuromuscular disorder   . Deafness     "very HOH; even w/hearing aides we have to communicate via writing things down for her to read" (08/25/2012)  . H/O hiatal hernia   . Arthritis     "a little all over" (08/25/2012)  . Lung mass     Hx: of  . Anxiety    Past Surgical History  Procedure Laterality Date  . Foot surgery Bilateral ~ 2000    "pinched nerve on feet" (08/25/2012)  . Vesicovaginal fistula closure w/ tah    . Abdominal hysterectomy  1998  . Tonsillectomy and adenoidectomy  1940's  . Cesarean section  1952  . Knee arthroscopy w/ meniscectomy Left 07/15/2010    partial medial and lateral meniscectomies.Hattie Perch 07/15/2010 (08/25/2012)  . Hip arthroplasty Right 08/26/2012    Procedure: ARTHROPLASTY BIPOLAR HIP;  Surgeon:  Shelda Pal, MD;  Location: [facility] OR;  Service: Orthopedics;  Laterality: Right;  . Open reduction internal fixation (orif) distal radial fracture Right 09/11/2012    Procedure: OPEN REDUCTION INTERNAL FIXATION (ORIF) RIGHT DISTAL RADIAL FRACTURE/ULNAR POSSIBLE BONE GRAFTING;  Surgeon: Sharma Covert, MD;  Location: [facility] OR;  Service: Orthopedics;  Laterality: Right;   Family History  Problem Relation Age of Onset  . Colon cancer Mother   . Colon cancer Sister   . Colon cancer Brother    History  Substance Use Topics  . Smoking status: Never Smoker   . Smokeless tobacco: Never Used  . Alcohol Use: No   OB History   Grav Para Term Preterm Abortions TAB SAB Ect Mult Living   2 2 2       2      Review of Systems  Constitutional: Negative for fever, activity change and appetite change.  HENT: Negative for congestion and rhinorrhea.   Respiratory: Negative for cough, chest tightness and shortness of breath.   Cardiovascular: Negative for chest pain.  Gastrointestinal: Negative for nausea, vomiting and abdominal pain.  Genitourinary: Positive for vaginal pain. Negative for dysuria, vaginal bleeding, vaginal discharge, genital sores and pelvic pain.  Musculoskeletal: Negative for back pain.  Neurological: Negative for dizziness, weakness and headaches.  A complete 10 system review of systems was obtained and all systems are negative except as noted in the HPI and  PMH.    Allergies  Aleve  Home Medications   Current Outpatient Rx  Name  Route  Sig  Dispense  Refill  . Calcium Glycerophosphate (PRELIEF PO)   Oral   Take 1-2 tablets by mouth daily as needed (as needed for indigestion).          . Cranberry-Vitamin C-Probiotic (AZO CRANBERRY PO)   Oral   Take 1 tablet by mouth 4 (four) times daily.         . ferrous sulfate 325 (65 FE) MG tablet   Oral   Take 325 mg by mouth 3 (three) times daily with meals.         . furosemide (LASIX) 40 MG tablet   Oral   Take 40 mg by  mouth daily.         Marland Kitchen gabapentin (NEURONTIN) 300 MG capsule   Oral   Take 300 mg by mouth 2 (two) times daily.         Marland Kitchen HYDROcodone-acetaminophen (NORCO/VICODIN) 5-325 MG per tablet   Oral   Take 1-2 tablets by mouth every 6 (six) hours as needed for pain.         Marland Kitchen losartan (COZAAR) 50 MG tablet   Oral   Take 50 mg by mouth daily.         . methocarbamol (ROBAXIN) 500 MG tablet   Oral   Take 500 mg by mouth every 6 (six) hours as needed (for muscle spasms).         . metoprolol succinate (TOPROL-XL) 100 MG 24 hr tablet   Oral   Take 100 mg by mouth daily. Take with or immediately following a meal.         . polyethylene glycol (MIRALAX / GLYCOLAX) packet   Oral   Take 17 g by mouth as needed (constipation).          Bernadette Hoit Sodium (SENNA S PO)   Oral   Take 1 tablet by mouth as needed (constipation).          . tamsulosin (FLOMAX) 0.4 MG CAPS capsule   Oral   Take 0.4 mg by mouth daily.         . clobetasol cream (TEMOVATE) 0.05 %   Topical   Apply topically 2 (two) times daily.   30 g   0   . liver oil-zinc oxide (DESITIN) 40 % ointment   Topical   Apply topically as needed for dry skin.   56.7 g   0    BP 105/54  Pulse 62  Temp(Src) 97.7 F (36.5 C) (Oral)  Resp 18  Ht 5\' 7"  (1.702 m)  Wt 159 lb (72.122 kg)  BMI 24.9 kg/m2  SpO2 96% Physical Exam  Constitutional: She is oriented to person, place, and time. She appears well-developed and well-nourished. No distress.  HENT:  Head: Normocephalic and atraumatic.  Mouth/Throat: Oropharynx is clear and moist. No oropharyngeal exudate.  Eyes: Conjunctivae and EOM are normal. Pupils are equal, round, and reactive to light.  Neck: Normal range of motion. Neck supple.  Cardiovascular: Normal rate, regular rhythm and normal heart sounds.   No murmur heard. Pulmonary/Chest: Effort normal. No respiratory distress.  Abdominal: Soft. There is no tenderness. There is no rebound  and no guarding.  Genitourinary:  Atrophic vaginal tissue.  No lesions, no erythema or bleeding, no discharge.  Musculoskeletal: Normal range of motion. She exhibits no edema and no tenderness.  Neurological: She is alert and oriented  to person, place, and time. No cranial nerve deficit. She exhibits normal muscle tone. Coordination normal.  Skin: Skin is warm.    ED Course  Procedures (including critical care time) Labs Review Labs Reviewed  COMPREHENSIVE METABOLIC PANEL - Abnormal; Notable for the following:    Sodium 133 (*)    Glucose, Bld 121 (*)    Total Bilirubin 0.2 (*)    GFR calc non Af Amer 53 (*)    GFR calc Af Amer 61 (*)    All other components within normal limits  URINALYSIS, ROUTINE W REFLEX MICROSCOPIC  CBC WITH DIFFERENTIAL  LIPASE, BLOOD   Imaging Review No results found.  MDM   1. Vaginal irritation    Acute on chronic vaginal pain. No dysuria or hematuria. No abdominal pain, fevers, vomiting. Denies sexual activity or abuse.  Exam without acute abnormality. Urinalysis negative.  Suspect a combination of vaginal atrophy along with chemical irritation from urine as patient is wearing diapers. Discussed with Dr. Lily Peer who is on call for Dr. Audie Box. He recommends continuing clobetasol twice a day and adding Desitin for barrier protection after using clobetasol for one week. F/u with Dr. Audie Box.  Glynn Octave, MD 11/28/12 1536

## 2012-11-28 NOTE — ED Notes (Signed)
MD at bedside. 

## 2012-11-28 NOTE — ED Notes (Signed)
Pt reports that she was diagnosed with low estrogen and was started on a cream 6 months ago. Pt c/o intermittent severe vaginal pain x 6 months. Pt is under care of urologist for bladder spasms.

## 2012-11-30 ENCOUNTER — Encounter: Payer: Self-pay | Admitting: Gynecology

## 2012-11-30 ENCOUNTER — Ambulatory Visit: Payer: Medicare Other | Admitting: Internal Medicine

## 2012-11-30 ENCOUNTER — Ambulatory Visit (INDEPENDENT_AMBULATORY_CARE_PROVIDER_SITE_OTHER): Payer: Medicare Other | Admitting: Gynecology

## 2012-11-30 DIAGNOSIS — L293 Anogenital pruritus, unspecified: Secondary | ICD-10-CM

## 2012-11-30 DIAGNOSIS — R300 Dysuria: Secondary | ICD-10-CM

## 2012-11-30 DIAGNOSIS — L292 Pruritus vulvae: Secondary | ICD-10-CM

## 2012-11-30 LAB — URINALYSIS W MICROSCOPIC + REFLEX CULTURE
Hgb urine dipstick: NEGATIVE
Leukocytes, UA: NEGATIVE
Nitrite: NEGATIVE
Specific Gravity, Urine: 1.015 (ref 1.005–1.030)
Urobilinogen, UA: 0.2 mg/dL (ref 0.0–1.0)
pH: 7 (ref 5.0–8.0)

## 2012-11-30 NOTE — Progress Notes (Signed)
Patient presents complaining of vulvar irritation. Has a long history of vulvar irritation as well as bladder irritation. He recently was evaluated by urology and started on Myrbetriq and trimethoprim suppression. Had been on vaginal estrogen cream for months before I initially saw her which did not seem to help with her complaints. Trial of Mytrex was ineffective and ultimately was started on Temovate cream. On questioning today though it seems like she had not been using the cream on regular basis. Her symptoms got so bad that she went to the emergency room over the weekend.  Exam was Administrator, [employer] vagina with atrophic changes. No discharge or skin changes to include pigmentation changes. Bimanual without masses or tenderness.  Urinalysis is negative  Assessment and plan: Persistent vulvar pruritus. Inadequate trial of Temovate. I went over in detail with the patient's daughter I want her to use the Temovate cream twice daily for one to 2 weeks and then daily for one month. She has not seen improvement by then we'll consider alternatives like possible retrial of a more aggressive estrogen cream. Assuming she has good results then after the first month she'll wean down from this over the following month and then stop it and see how she does.

## 2012-11-30 NOTE — Patient Instructions (Addendum)
Used Temovate (clobesterol) cream twice nightly for one to 2 weeks and then nightly for one month. Wean down from this over the following month. If vulvar irritation continues then followup for further evaluation.

## 2012-12-03 ENCOUNTER — Telehealth: Payer: Self-pay | Admitting: *Deleted

## 2012-12-03 NOTE — Telephone Encounter (Signed)
Margaret from [facility] called requesting Verbal Orders for home health Occ Therapy twice a week for 4 weeks.  Also Maureen Ralphs [phone] from [facility] called requesting Verbal Orders for [facility]ra. three times a week for 2 weeks, the twice a week for 2 weeks.  Please advise

## 2012-12-04 NOTE — Telephone Encounter (Signed)
Ok for services as requested

## 2012-12-06 ENCOUNTER — Encounter (HOSPITAL_COMMUNITY): Payer: Self-pay | Admitting: Emergency Medicine

## 2012-12-06 ENCOUNTER — Emergency Department (HOSPITAL_COMMUNITY)
Admission: EM | Admit: 2012-12-06 | Discharge: 2012-12-06 | Disposition: A | Discharge status: Home / Self Care | Payer: Medicare Other | Attending: Emergency Medicine | Admitting: Emergency Medicine

## 2012-12-06 DIAGNOSIS — Z8719 Personal history of other diseases of the digestive system: Secondary | ICD-10-CM | POA: Insufficient documentation

## 2012-12-06 DIAGNOSIS — F411 Generalized anxiety disorder: Secondary | ICD-10-CM | POA: Insufficient documentation

## 2012-12-06 DIAGNOSIS — Z8709 Personal history of other diseases of the respiratory system: Secondary | ICD-10-CM | POA: Insufficient documentation

## 2012-12-06 DIAGNOSIS — Z79899 Other long term (current) drug therapy: Secondary | ICD-10-CM | POA: Insufficient documentation

## 2012-12-06 DIAGNOSIS — Z8619 Personal history of other infectious and parasitic diseases: Secondary | ICD-10-CM | POA: Insufficient documentation

## 2012-12-06 DIAGNOSIS — F39 Unspecified mood [affective] disorder: Secondary | ICD-10-CM | POA: Insufficient documentation

## 2012-12-06 DIAGNOSIS — N949 Unspecified condition associated with female genital organs and menstrual cycle: Secondary | ICD-10-CM | POA: Insufficient documentation

## 2012-12-06 DIAGNOSIS — Z8679 Personal history of other diseases of the circulatory system: Secondary | ICD-10-CM | POA: Insufficient documentation

## 2012-12-06 DIAGNOSIS — R110 Nausea: Secondary | ICD-10-CM | POA: Insufficient documentation

## 2012-12-06 DIAGNOSIS — R102 Pelvic and perineal pain: Secondary | ICD-10-CM

## 2012-12-06 DIAGNOSIS — Z8669 Personal history of other diseases of the nervous system and sense organs: Secondary | ICD-10-CM | POA: Insufficient documentation

## 2012-12-06 DIAGNOSIS — I10 Essential (primary) hypertension: Secondary | ICD-10-CM | POA: Insufficient documentation

## 2012-12-06 DIAGNOSIS — R350 Frequency of micturition: Secondary | ICD-10-CM | POA: Insufficient documentation

## 2012-12-06 DIAGNOSIS — Z8739 Personal history of other diseases of the musculoskeletal system and connective tissue: Secondary | ICD-10-CM | POA: Insufficient documentation

## 2012-12-06 DIAGNOSIS — M129 Arthropathy, unspecified: Secondary | ICD-10-CM | POA: Insufficient documentation

## 2012-12-06 LAB — URINALYSIS, ROUTINE W REFLEX MICROSCOPIC
Glucose, UA: NEGATIVE mg/dL
Hgb urine dipstick: NEGATIVE
Leukocytes, UA: NEGATIVE
Specific Gravity, Urine: 1.012 (ref 1.005–1.030)
pH: 6.5 (ref 5.0–8.0)

## 2012-12-06 NOTE — ED Provider Notes (Signed)
CSN: [id number]     Arrival date & time 12/06/12  1212 History   First MD Initiated Contact with Patient 12/06/12 1238     Chief Complaint  Patient presents with  . vaginal burning   . Nausea    HPI   Patient was admitted with orthopedic injuries in April. She spent several months in a rehabilitation hospital. She has been home for about 10 days. Has had a few visits regarding vaginal pain. Has been diagnosed with atrophic vaginitis. Is on Premarin cream. Is on a steroid cream now.  She really has no complaints today. She states that she was hoping we take with her physician and that we can arrange hospice. He stated "I'm just ready to pass on and off they might be able to help me".  I explained to her that the euthanasia is not somethin hat hospital, or that this hospital performed.  She denies any true intent at self-harm. Ultimately she states it is wondered if it had a bladder infection. Past Medical History  Diagnosis Date  . History of hiatal hernia   . Mitral valve prolapse   . Plantar fasciitis   . Hay fever   . GERD (gastroesophageal reflux disease)   . Palpitations   . Hypertension   . Neuromuscular disorder   . Deafness     "very HOH; even w/hearing aides we have to communicate via writing things down for her to read" (08/25/2012)  . H/O hiatal hernia   . Arthritis     "a little all over" (08/25/2012)  . Lung mass     Hx: of  . Anxiety    Past Surgical History  Procedure Laterality Date  . Foot surgery Bilateral ~ 2000    "pinched nerve on feet" (08/25/2012)  . Vesicovaginal fistula closure w/ tah    . Abdominal hysterectomy  1998  . Tonsillectomy and adenoidectomy  1940's  . Cesarean section  1952  . Knee arthroscopy w/ meniscectomy Left 07/15/2010    partial medial and lateral meniscectomies.Hattie Perch 07/15/2010 (08/25/2012)  . Hip arthroplasty Right 08/26/2012    Procedure: ARTHROPLASTY BIPOLAR HIP;  Surgeon: Shelda Pal, MD;  Location: [facility] OR;  Service: Orthopedics;   Laterality: Right;  . Open reduction internal fixation (orif) distal radial fracture Right 09/11/2012    Procedure: OPEN REDUCTION INTERNAL FIXATION (ORIF) RIGHT DISTAL RADIAL FRACTURE/ULNAR POSSIBLE BONE GRAFTING;  Surgeon: Sharma Covert, MD;  Location: [facility] OR;  Service: Orthopedics;  Laterality: Right;   Family History  Problem Relation Age of Onset  . Colon cancer Mother   . Colon cancer Sister   . Colon cancer Brother    History  Substance Use Topics  . Smoking status: Never Smoker   . Smokeless tobacco: Never Used  . Alcohol Use: No   OB History   Grav Para Term Preterm Abortions TAB SAB Ect Mult Living   2 2 2       2      Review of Systems  Constitutional: Negative for fever, chills, diaphoresis, appetite change and fatigue.  HENT: Negative for sore throat, mouth sores and trouble swallowing.   Eyes: Negative for visual disturbance.  Respiratory: Negative for cough, chest tightness, shortness of breath and wheezing.   Cardiovascular: Negative for chest pain.  Gastrointestinal: Negative for nausea, vomiting, abdominal pain, diarrhea and abdominal distention.  Endocrine: Negative for polydipsia, polyphagia and polyuria.  Genitourinary: Positive for frequency and vaginal pain. Negative for dysuria and hematuria.  Musculoskeletal: Negative for gait problem.  Skin: Negative for color change, pallor and rash.  Neurological: Negative for dizziness, syncope, light-headedness and headaches.  Hematological: Does not bruise/bleed easily.  Psychiatric/Behavioral: Positive for dysphoric mood. Negative for behavioral problems, confusion and self-injury.    Allergies  Aleve  Home Medications   Current Outpatient Rx  Name  Route  Sig  Dispense  Refill  . Calcium Glycerophosphate (PRELIEF PO)   Oral   Take 1-2 tablets by mouth daily as needed (as needed for indigestion).          . clobetasol cream (TEMOVATE) 0.05 %   Topical   Apply topically 2 (two) times daily.   30 g    0   . Cranberry-Vitamin C-Probiotic (AZO CRANBERRY PO)   Oral   Take 1 tablet by mouth 4 (four) times daily.         . ferrous sulfate 325 (65 FE) MG tablet   Oral   Take 325 mg by mouth 3 (three) times daily with meals.         . furosemide (LASIX) 40 MG tablet   Oral   Take 40 mg by mouth daily.         Marland Kitchen gabapentin (NEURONTIN) 300 MG capsule   Oral   Take 300 mg by mouth 2 (two) times daily.         Marland Kitchen HYDROcodone-acetaminophen (NORCO/VICODIN) 5-325 MG per tablet   Oral   Take 1-2 tablets by mouth every 6 (six) hours as needed for pain.         Marland Kitchen liver oil-zinc oxide (DESITIN) 40 % ointment   Topical   Apply topically as needed for dry skin.   56.7 g   0   . losartan (COZAAR) 50 MG tablet   Oral   Take 50 mg by mouth daily.         . methocarbamol (ROBAXIN) 500 MG tablet   Oral   Take 500 mg by mouth 2 (two) times daily as needed (for muscle spasms).          . metoprolol succinate (TOPROL-XL) 100 MG 24 hr tablet   Oral   Take 100 mg by mouth daily. Take with or immediately following a meal.         . mirabegron ER (MYRBETRIQ) 50 MG TB24 tablet   Oral   Take 50 mg by mouth daily.         . polyethylene glycol (MIRALAX / GLYCOLAX) packet   Oral   Take 17 g by mouth as needed (constipation).          Bernadette Hoit Sodium (SENNA S PO)   Oral   Take 1 tablet by mouth as needed (constipation).          . trimethoprim (TRIMPEX) 100 MG tablet   Oral   Take 100 mg by mouth 2 (two) times daily.          BP 135/69  Pulse 72  Temp(Src) 98.2 F (36.8 C) (Oral)  Resp 16  SpO2 97% Physical Exam  Constitutional: She is oriented to person, place, and time. She appears well-developed and well-nourished. No distress.  HENT:  Head: Normocephalic.  Eyes: Conjunctivae are normal. Pupils are equal, round, and reactive to light. No scleral icterus.  Neck: Normal range of motion. Neck supple. No thyromegaly present.  Cardiovascular:  Normal rate and regular rhythm.  Exam reveals no gallop and no friction rub.   No murmur heard. Pulmonary/Chest: Effort normal and breath sounds normal. No  respiratory distress. She has no wheezes. She has no rales.  Abdominal: Soft. Bowel sounds are normal. She exhibits no distension. There is no tenderness. There is no rebound.  Genitourinary:     Musculoskeletal: Normal range of motion.  Neurological: She is alert and oriented to person, place, and time.  Skin: Skin is warm and dry. No rash noted.  Psychiatric: She has a normal mood and affect. Her behavior is normal.    ED Course  Procedures (including critical care time) Labs Review Labs Reviewed  URINE CULTURE  URINALYSIS, ROUTINE W REFLEX MICROSCOPIC   Imaging Review No results found.  MDM   1. Vaginal pain    obvious signs of infection. Normal urine. I think her appointment tomorrow morning with her physician will suffice.    Claudean Kinds, MD 12/06/12 [phone]

## 2012-12-06 NOTE — Telephone Encounter (Signed)
Left message on Margarets VM advising of MDs order.  Spoke with Maureen Ralphs advising of MDs order

## 2012-12-06 NOTE — ED Notes (Signed)
Pt comes from home by EMS c/o vaginal burning and difficulty ambulating times 6 months, but nausea started this morning, but denies vomiting.

## 2012-12-06 NOTE — ED Notes (Signed)
Bed: WA20 Expected date:  Expected time:  Means of arrival:  Comments: ems 

## 2012-12-07 LAB — URINE CULTURE

## 2012-12-08 ENCOUNTER — Encounter: Payer: Self-pay | Admitting: Internal Medicine

## 2012-12-08 ENCOUNTER — Ambulatory Visit (INDEPENDENT_AMBULATORY_CARE_PROVIDER_SITE_OTHER)
Admission: RE | Admit: 2012-12-08 | Discharge: 2012-12-08 | Disposition: A | Discharge status: Home / Self Care | Payer: Medicare Other | Source: Ambulatory Visit | Attending: Internal Medicine | Admitting: Internal Medicine

## 2012-12-08 ENCOUNTER — Other Ambulatory Visit (INDEPENDENT_AMBULATORY_CARE_PROVIDER_SITE_OTHER): Payer: Medicare Other

## 2012-12-08 ENCOUNTER — Ambulatory Visit (INDEPENDENT_AMBULATORY_CARE_PROVIDER_SITE_OTHER): Payer: Medicare Other | Admitting: Internal Medicine

## 2012-12-08 VITALS — BP 100/64 | HR 76 | Temp 97.5°F | Wt 156.4 lb

## 2012-12-08 DIAGNOSIS — D509 Iron deficiency anemia, unspecified: Secondary | ICD-10-CM

## 2012-12-08 DIAGNOSIS — R3911 Hesitancy of micturition: Secondary | ICD-10-CM

## 2012-12-08 DIAGNOSIS — S42309S Unspecified fracture of shaft of humerus, unspecified arm, sequela: Secondary | ICD-10-CM

## 2012-12-08 DIAGNOSIS — N949 Unspecified condition associated with female genital organs and menstrual cycle: Secondary | ICD-10-CM

## 2012-12-08 DIAGNOSIS — R911 Solitary pulmonary nodule: Secondary | ICD-10-CM

## 2012-12-08 DIAGNOSIS — I10 Essential (primary) hypertension: Secondary | ICD-10-CM

## 2012-12-08 DIAGNOSIS — S72141S Displaced intertrochanteric fracture of right femur, sequela: Secondary | ICD-10-CM

## 2012-12-08 DIAGNOSIS — H9190 Unspecified hearing loss, unspecified ear: Secondary | ICD-10-CM

## 2012-12-08 DIAGNOSIS — N9489 Other specified conditions associated with female genital organs and menstrual cycle: Secondary | ICD-10-CM

## 2012-12-08 DIAGNOSIS — H9193 Unspecified hearing loss, bilateral: Secondary | ICD-10-CM

## 2012-12-08 DIAGNOSIS — S72009S Fracture of unspecified part of neck of unspecified femur, sequela: Secondary | ICD-10-CM

## 2012-12-08 DIAGNOSIS — S62101S Fracture of unspecified carpal bone, right wrist, sequela: Secondary | ICD-10-CM

## 2012-12-08 LAB — HEMOGLOBIN AND HEMATOCRIT, BLOOD: HCT: 37.2 % (ref 36.0–46.0)

## 2012-12-08 LAB — COMPREHENSIVE METABOLIC PANEL
ALT: 15 U/L (ref 0–35)
Albumin: 3.8 g/dL (ref 3.5–5.2)
CO2: 22 mEq/L (ref 19–32)
Chloride: 99 mEq/L (ref 96–112)
GFR: 52.36 mL/min — ABNORMAL LOW (ref >60.00)
Potassium: 4.8 mEq/L (ref 3.5–5.1)
Sodium: 128 mEq/L — ABNORMAL LOW (ref 135–145)
Total Bilirubin: 0.5 mg/dL (ref 0.3–1.2)
Total Protein: 6.6 g/dL (ref 6.0–8.3)

## 2012-12-08 LAB — IBC PANEL: Transferrin: 238.7 mg/dL (ref 212.0–360.0)

## 2012-12-08 NOTE — Progress Notes (Signed)
Subjective:    Patient ID: [name], female    DOB: October 17, 1915, 79 y.o.   MRN: [id number]  HPI [name] has been home from SNF approximately 2 weeks.   In the interval she has had several visits to the ED for vulvar burning. She has failed premarin cream for atrophic vaginitis. Dr. Audie Box finally started her on clobetazole for vulvar eczema. This has helped.  She has seen Dr. McDiarmid for bladder function. His note is reviewed. He continued tamsulosin and added myrbetriq. She has not really seen a lot of change yet.   She is taking iron replacement as well as Potassium.  She was started on gabapentin for post-fracture pain as well as methocarbamol for muscle spasm.  Her daughter wants her to have her iron level checked and her potassium level checked.  Past Medical History  Diagnosis Date  . History of hiatal hernia   . Mitral valve prolapse   . Plantar fasciitis   . Hay fever   . GERD (gastroesophageal reflux disease)   . Palpitations   . Hypertension   . Neuromuscular disorder   . Deafness     "very HOH; even w/hearing aides we have to communicate via writing things down for her to read" (08/25/2012)  . H/O hiatal hernia   . Arthritis     "a little all over" (08/25/2012)  . Lung mass     Hx: of  . Anxiety    Past Surgical History  Procedure Laterality Date  . Foot surgery Bilateral ~ 2000    "pinched nerve on feet" (08/25/2012)  . Vesicovaginal fistula closure w/ tah    . Abdominal hysterectomy  1998  . Tonsillectomy and adenoidectomy  1940's  . Cesarean section  1952  . Knee arthroscopy w/ meniscectomy Left 07/15/2010    partial medial and lateral meniscectomies.Hattie Perch 07/15/2010 (08/25/2012)  . Hip arthroplasty Right 08/26/2012    Procedure: ARTHROPLASTY BIPOLAR HIP;  Surgeon: Shelda Pal, MD;  Location: [facility];  Service: Orthopedics;  Laterality: Right;  . Open reduction internal fixation (orif) distal radial fracture Right 09/11/2012    Procedure:  OPEN REDUCTION INTERNAL FIXATION (ORIF) RIGHT DISTAL RADIAL FRACTURE/ULNAR POSSIBLE BONE GRAFTING;  Surgeon: Sharma Covert, MD;  Location: [facility] OR;  Service: Orthopedics;  Laterality: Right;   Family History  Problem Relation Age of Onset  . Colon cancer Mother   . Colon cancer Sister   . Colon cancer Brother    History   Social History  . Marital Status: Widowed    Spouse Name: N/A    Number of Children: N/A  . Years of Education: N/A   Occupational History  . Not on file.   Social History Main Topics  . Smoking status: Never Smoker   . Smokeless tobacco: Never Used  . Alcohol Use: No  . Drug Use: No  . Sexual Activity: No   Other Topics Concern  . Not on file   Social History Narrative   Married 47 years - widowed 1996   1 son, 1 daughter 2 grandsons   Lives in her own home, son lives with her. Tries to walk every day. Starting to get out more after ortho injury. I- ADLS.   End-of-life issues. DNR and no heroic or extraordinary measures.     Current Outpatient Prescriptions on File Prior to Visit  Medication Sig Dispense Refill  . Calcium Glycerophosphate (PRELIEF PO) Take 1-2 tablets by mouth daily as needed (as needed for indigestion).       Marland Kitchen  clobetasol cream (TEMOVATE) 0.05 % Apply topically 2 (two) times daily.  30 g  0  . Cranberry-Vitamin C-Probiotic (AZO CRANBERRY PO) Take 1 tablet by mouth 4 (four) times daily.      . ferrous sulfate 325 (65 FE) MG tablet Take 325 mg by mouth 3 (three) times daily with meals.      . furosemide (LASIX) 40 MG tablet Take 40 mg by mouth daily.      Marland Kitchen gabapentin (NEURONTIN) 300 MG capsule Take 300 mg by mouth 2 (two) times daily.      Marland Kitchen HYDROcodone-acetaminophen (NORCO/VICODIN) 5-325 MG per tablet Take 1-2 tablets by mouth every 6 (six) hours as needed for pain.      Marland Kitchen liver oil-zinc oxide (DESITIN) 40 % ointment Apply topically as needed for dry skin.  56.7 g  0  . losartan (COZAAR) 50 MG tablet Take 50 mg by mouth daily.      .  methocarbamol (ROBAXIN) 500 MG tablet Take 500 mg by mouth 2 (two) times daily as needed (for muscle spasms).       . metoprolol succinate (TOPROL-XL) 100 MG 24 hr tablet Take 100 mg by mouth daily. Take with or immediately following a meal.      . mirabegron ER (MYRBETRIQ) 50 MG TB24 tablet Take 50 mg by mouth daily.      . polyethylene glycol (MIRALAX / GLYCOLAX) packet Take 17 g by mouth as needed (constipation).       Bernadette Hoit Sodium (SENNA S PO) Take 1 tablet by mouth as needed (constipation).       . trimethoprim (TRIMPEX) 100 MG tablet Take 100 mg by mouth 2 (two) times daily.       No current facility-administered medications on file prior to visit.      Review of Systems Constitutional:  Negative for fever, chills, activity change and unexpected weight change.  HEENT:  Negative for hearing loss, ear pain, congestion, neck stiffness and postnasal drip. Negative for sore throat or swallowing problems. Negative for dental complaints.   Eyes: Negative for vision loss or change in visual acuity.  Respiratory: Negative for chest tightness and wheezing. Negative for DOE.   Cardiovascular: Negative for chest pain or palpitations. No decreased exercise tolerance Gastrointestinal: No change in bowel habit. No bloating or gas. No reflux or indigestion Genitourinary: Negative for urgency, frequency, flank pain and difficulty urinating.  Musculoskeletal: Negative for myalgias, back pain, arthralgias and gait problem.  Neurological: Negative for dizziness, tremors, weakness and headaches.  Hematological: Negative for adenopathy.  Psychiatric/Behavioral: Negative for behavioral problems and dysphoric mood.       Objective:   Physical Exam Filed Vitals:   12/08/12 1404  BP: 100/64  Pulse: 76  Temp: 97.5 F (36.4 C)   Wt Readings from Last 3 Encounters:  12/08/12 156 lb 6.4 oz (70.943 kg)  11/28/12 159 lb (72.122 kg)  10/28/12 161 lb (73.029 kg)   Gen'l- Well preserved  79 y/o woman in no distress HEENT - C&S clear Cor 2+ radial pulse Pulm - normal respirations, Clear to A&P Neuro - HOH, A&O x 3, walking with a walker.   Recent Results (from the past 2160 hour(s))  URINALYSIS W MICROSCOPIC + REFLEX CULTURE     Status: Abnormal   Collection Time    10/18/12 10:22 AM      Result Value Range   Color, Urine YELLOW  YELLOW   APPearance CLEAR  CLEAR   Specific Gravity, Urine 1.010  1.005 -  1.030   pH 6.0  5.0 - 8.0   Glucose, UA NEG  NEG mg/dL   Bilirubin Urine NEG  NEG   Ketones, ur NEG  NEG mg/dL   Hgb urine dipstick TRACE (*) NEG   Protein, ur NEG  NEG mg/dL   Urobilinogen, UA 0.2  0.0 - 1.0 mg/dL   Nitrite NEG  NEG   Leukocytes, UA MOD (*) NEG   Squamous Epithelial / LPF RARE  RARE   Crystals NONE SEEN  NONE SEEN   Casts NONE SEEN  NONE SEEN   WBC, UA TNTC (*) <3 WBC/hpf   RBC / HPF 0-2  <3 RBC/hpf   Bacteria, UA MANY (*) RARE   Comment: *URINE CULTURE PENDING*  URINE CULTURE     Status: None   Collection Time    10/18/12 10:22 AM      Result Value Range   Colony Count >=100,000 COLONIES/ML     Organism ID, Bacteria ESCHERICHIA COLI    URINALYSIS, ROUTINE W REFLEX MICROSCOPIC     Status: None   Collection Time    10/25/12  1:24 PM      Result Value Range   Color, Urine YELLOW  YELLOW   APPearance CLEAR  CLEAR   Specific Gravity, Urine 1.007  1.005 - 1.030   pH 7.5  5.0 - 8.0   Glucose, UA NEGATIVE  NEGATIVE mg/dL   Hgb urine dipstick NEGATIVE  NEGATIVE   Bilirubin Urine NEGATIVE  NEGATIVE   Ketones, ur NEGATIVE  NEGATIVE mg/dL   Protein, ur NEGATIVE  NEGATIVE mg/dL   Urobilinogen, UA 0.2  0.0 - 1.0 mg/dL   Nitrite NEGATIVE  NEGATIVE   Leukocytes, UA NEGATIVE  NEGATIVE   Comment: MICROSCOPIC NOT DONE ON URINES WITH NEGATIVE PROTEIN, BLOOD, LEUKOCYTES, NITRITE, OR GLUCOSE <1000 mg/dL.  CBC WITH DIFFERENTIAL     Status: Abnormal   Collection Time    10/25/12  2:07 PM      Result Value Range   WBC 7.8  4.0 - 10.5 K/uL   RBC 4.01   3.87 - 5.11 MIL/uL   Hemoglobin 12.0  12.0 - 15.0 g/dL   HCT 91.4 (*) 78.2 - 95.6 %   MCV 88.0  78.0 - 100.0 fL   MCH 29.9  26.0 - 34.0 pg   MCHC 34.0  30.0 - 36.0 g/dL   RDW 21.3  08.6 - 57.8 %   Platelets 276  150 - 400 K/uL   Neutrophils Relative % 56  43 - 77 %   Neutro Abs 4.3  1.7 - 7.7 K/uL   Lymphocytes Relative 31  12 - 46 %   Lymphs Abs 2.4  0.7 - 4.0 K/uL   Monocytes Relative 11  3 - 12 %   Monocytes Absolute 0.8  0.1 - 1.0 K/uL   Eosinophils Relative 2  0 - 5 %   Eosinophils Absolute 0.1  0.0 - 0.7 K/uL   Basophils Relative 1  0 - 1 %   Basophils Absolute 0.1  0.0 - 0.1 K/uL  BASIC METABOLIC PANEL     Status: Abnormal   Collection Time    10/25/12  2:07 PM      Result Value Range   Sodium 136  135 - 145 mEq/L   Potassium 3.8  3.5 - 5.1 mEq/L   Chloride 98  96 - 112 mEq/L   CO2 27  19 - 32 mEq/L   Glucose, Bld 119 (*) 70 - 99 mg/dL  BUN 9  6 - 23 mg/dL   Creatinine, Ser 0.98  0.50 - 1.10 mg/dL   Calcium 9.5  8.4 - 11.9 mg/dL   GFR calc non Af Amer 73 (*) >90 mL/min   GFR calc Af Amer 85 (*) >90 mL/min   Comment:            The eGFR has been calculated     using the CKD EPI equation.     This calculation has not been     validated in all clinical     situations.     eGFR's persistently     <90 mL/min signify     possible Chronic Kidney Disease.  CG4 I-STAT (LACTIC ACID)     Status: None   Collection Time    10/25/12  2:22 PM      Result Value Range   Lactic Acid, Venous 0.95  0.5 - 2.2 mmol/L  URINALYSIS, ROUTINE W REFLEX MICROSCOPIC     Status: None   Collection Time    11/28/12 10:53 AM      Result Value Range   Color, Urine YELLOW  YELLOW   APPearance CLEAR  CLEAR   Specific Gravity, Urine 1.008  1.005 - 1.030   pH 7.0  5.0 - 8.0   Glucose, UA NEGATIVE  NEGATIVE mg/dL   Hgb urine dipstick NEGATIVE  NEGATIVE   Bilirubin Urine NEGATIVE  NEGATIVE   Ketones, ur NEGATIVE  NEGATIVE mg/dL   Protein, ur NEGATIVE  NEGATIVE mg/dL   Urobilinogen, UA 0.2   0.0 - 1.0 mg/dL   Nitrite NEGATIVE  NEGATIVE   Leukocytes, UA NEGATIVE  NEGATIVE   Comment: MICROSCOPIC NOT DONE ON URINES WITH NEGATIVE PROTEIN, BLOOD, LEUKOCYTES, NITRITE, OR GLUCOSE <1000 mg/dL.  CBC WITH DIFFERENTIAL     Status: None   Collection Time    11/28/12 11:28 AM      Result Value Range   WBC 9.6  4.0 - 10.5 K/uL   RBC 4.48  3.87 - 5.11 MIL/uL   Hemoglobin 13.4  12.0 - 15.0 g/dL   HCT 14.7  82.9 - 56.2 %   MCV 89.7  78.0 - 100.0 fL   MCH 29.9  26.0 - 34.0 pg   MCHC 33.3  30.0 - 36.0 g/dL   RDW 13.0  86.5 - 78.4 %   Platelets 290  150 - 400 K/uL   Neutrophils Relative % 69  43 - 77 %   Neutro Abs 6.7  1.7 - 7.7 K/uL   Lymphocytes Relative 19  12 - 46 %   Lymphs Abs 1.8  0.7 - 4.0 K/uL   Monocytes Relative 11  3 - 12 %   Monocytes Absolute 1.0  0.1 - 1.0 K/uL   Eosinophils Relative 1  0 - 5 %   Eosinophils Absolute 0.1  0.0 - 0.7 K/uL   Basophils Relative 0  0 - 1 %   Basophils Absolute 0.0  0.0 - 0.1 K/uL  COMPREHENSIVE METABOLIC PANEL     Status: Abnormal   Collection Time    11/28/12 11:28 AM      Result Value Range   Sodium 133 (*) 135 - 145 mEq/L   Potassium 4.8  3.5 - 5.1 mEq/L   Chloride 96  96 - 112 mEq/L   CO2 25  19 - 32 mEq/L   Glucose, Bld 121 (*) 70 - 99 mg/dL   BUN 18  6 - 23 mg/dL   Creatinine, Ser 6.96  0.50 - 1.10 mg/dL   Calcium 9.3  8.4 - 16.1 mg/dL   Total Protein 7.0  6.0 - 8.3 g/dL   Albumin 3.7  3.5 - 5.2 g/dL   AST 15  0 - 37 U/L   ALT 11  0 - 35 U/L   Alkaline Phosphatase 61  39 - 117 U/L   Total Bilirubin 0.2 (*) 0.3 - 1.2 mg/dL   GFR calc non Af Amer 53 (*) >90 mL/min   GFR calc Af Amer 61 (*) >90 mL/min   Comment: (NOTE)     The eGFR has been calculated using the CKD EPI equation.     This calculation has not been validated in all clinical situations.     eGFR's persistently <90 mL/min signify possible Chronic Kidney     Disease.  LIPASE, BLOOD     Status: None   Collection Time    11/28/12 11:28 AM      Result Value Range    Lipase 38  11 - 59 U/L  URINALYSIS W MICROSCOPIC + REFLEX CULTURE     Status: None   Collection Time    11/30/12  4:12 PM      Result Value Range   Color, Urine YELLOW  YELLOW   APPearance CLEAR  CLEAR   Specific Gravity, Urine 1.015  1.005 - 1.030   pH 7.0  5.0 - 8.0   Glucose, UA NEG  NEG mg/dL   Bilirubin Urine NEG  NEG   Ketones, ur NEG  NEG mg/dL   Hgb urine dipstick NEG  NEG   Protein, ur NEG  NEG mg/dL   Urobilinogen, UA 0.2  0.0 - 1.0 mg/dL   Nitrite NEG  NEG   Leukocytes, UA NEG  NEG  URINALYSIS, ROUTINE W REFLEX MICROSCOPIC     Status: None   Collection Time    12/06/12  2:19 PM      Result Value Range   Color, Urine YELLOW  YELLOW   APPearance CLEAR  CLEAR   Specific Gravity, Urine 1.012  1.005 - 1.030   pH 6.5  5.0 - 8.0   Glucose, UA NEGATIVE  NEGATIVE mg/dL   Hgb urine dipstick NEGATIVE  NEGATIVE   Bilirubin Urine NEGATIVE  NEGATIVE   Ketones, ur NEGATIVE  NEGATIVE mg/dL   Protein, ur NEGATIVE  NEGATIVE mg/dL   Urobilinogen, UA 0.2  0.0 - 1.0 mg/dL   Nitrite NEGATIVE  NEGATIVE   Leukocytes, UA NEGATIVE  NEGATIVE   Comment: MICROSCOPIC NOT DONE ON URINES WITH NEGATIVE PROTEIN, BLOOD, LEUKOCYTES, NITRITE, OR GLUCOSE <1000 mg/dL.  URINE CULTURE     Status: None   Collection Time    12/06/12  2:19 PM      Result Value Range   Specimen Description URINE, CLEAN CATCH     Special Requests NONE     Culture  Setup Time       Value: 12/06/2012 19:26     Performed at [facility] Count       Value: 20,OOO COLONIES/ML     Performed at [facility]   Culture       Value: Multiple bacterial morphotypes present, none predominant. Suggest appropriate recollection if clinically indicated.     Performed at [facility]   Report Status 12/07/2012 FINAL    IBC PANEL     Status: None   Collection Time    12/08/12  2:55 PM      Result Value  Range   Iron 84  42 - 145 ug/dL   Transferrin 161.0  960.4 - 360.0 mg/dL   Saturation Ratios  54.0  20.0 - 50.0 %  HEMOGLOBIN AND HEMATOCRIT, BLOOD     Status: None   Collection Time    12/08/12  2:55 PM      Result Value Range   Hemoglobin 12.7  12.0 - 15.0 g/dL   HCT 98.1  19.1 - 47.8 %  COMPREHENSIVE METABOLIC PANEL     Status: Abnormal   Collection Time    12/08/12  2:55 PM      Result Value Range   Sodium 128 (*) 135 - 145 mEq/L   Potassium 4.8  3.5 - 5.1 mEq/L   Chloride 99  96 - 112 mEq/L   CO2 22  19 - 32 mEq/L   Glucose, Bld 105 (*) 70 - 99 mg/dL   BUN 26 (*) 6 - 23 mg/dL   Creatinine, Ser 1.0  0.4 - 1.2 mg/dL   Total Bilirubin 0.5  0.3 - 1.2 mg/dL   Alkaline Phosphatase 62  39 - 117 U/L   AST 16  0 - 37 U/L   ALT 15  0 - 35 U/L   Total Protein 6.6  6.0 - 8.3 g/dL   Albumin 3.8  3.5 - 5.2 g/dL   Calcium 8.8  8.4 - 29.5 mg/dL   GFR 62.13 (*) >08.65 mL/min         Assessment & Plan:

## 2012-12-08 NOTE — Patient Instructions (Addendum)
1. Vulvar burning - with a good response to Clobetazole the problem is most likely vulvar eczema. Plan Continue clobetazole as directed  2. Iron deficiency anemia - Plan Lab: blood count and iron level today with recommendations to follow  3. Blood pressure -  BP Readings from Last 3 Encounters:  12/08/12 100/64  12/06/12 139/73  11/28/12 105/54   Plan Continue present medication  Lab: comprehensive metabolic panel that includes kidney function and potassium  4. Orthopedics - seems you are doing really well!!! Plan OK to continue gabapentin as you are and the methocarbamol for muscle spasm  5. Bladder function - the tamsulosin was for urinary retention. Dr. McDiarmid thought you had a component of overactive bladder Plan May stop the tamsulosin  Continue the myrbetriq  6. Pulmonary nodule seen May 28,2014. Plan Follow up CT scan to be done at [facility] on [address] - you can go from here to there. [address] just pass [place]

## 2012-12-10 ENCOUNTER — Telehealth: Payer: Self-pay | Admitting: *Deleted

## 2012-12-10 ENCOUNTER — Telehealth: Payer: Self-pay | Admitting: Internal Medicine

## 2012-12-10 DIAGNOSIS — N390 Urinary tract infection, site not specified: Secondary | ICD-10-CM

## 2012-12-10 DIAGNOSIS — Z9181 History of falling: Secondary | ICD-10-CM

## 2012-12-10 DIAGNOSIS — IMO0001 Reserved for inherently not codable concepts without codable children: Secondary | ICD-10-CM

## 2012-12-10 DIAGNOSIS — Z9189 Other specified personal risk factors, not elsewhere classified: Secondary | ICD-10-CM

## 2012-12-10 DIAGNOSIS — R269 Unspecified abnormalities of gait and mobility: Secondary | ICD-10-CM

## 2012-12-10 NOTE — Telephone Encounter (Signed)
Pt called to follow up from OV on 11/30/12 she has started the temovate nightly for the month and woke up this am with vaginal burning. Pt said she doesn't think using this nightly will work. Directions were to Temovate cream twice daily for one to 2 weeks and then daily for one month. She is going to use twice daily for now until response from you.  Pt asked me to relay this to you. Please advise

## 2012-12-10 NOTE — Telephone Encounter (Signed)
Message copied by Newell Coral on Fri Dec 10, 2012  8:05 AM ------      Message from: Illene Regulus E      Created: Thu Dec 09, 2012  5:43 AM       Appointment to discuss CT scan of  Chest.      Thanks ------

## 2012-12-10 NOTE — Assessment & Plan Note (Signed)
BP Readings from Last 3 Encounters:  12/08/12 100/64  12/06/12 139/73  11/28/12 105/54   Doing well on present medication: tolerates low BP w/o symptoms.

## 2012-12-10 NOTE — Assessment & Plan Note (Signed)
Patient with urinary issues - retention and OAB. She has been followed by Dr. Perley Jain. Currently doing well on combination of tamsulosin and myrbetriq.

## 2012-12-10 NOTE — Assessment & Plan Note (Signed)
Right wrist fracture June 14, '14 with ORIF by Dr. Melvyn Novas. She has done well with good recovery and close to normal subsequent function.

## 2012-12-10 NOTE — Telephone Encounter (Signed)
Use twice daily as I asked her initially to do.  No other response needed.

## 2012-12-10 NOTE — Assessment & Plan Note (Signed)
Patient with severe vulvar burning. She had been diagnosed with atrophic vaginitis but did not respond to topical hormonal cream. She has responded to clobetazole - diagnosis may be vulvar eczema.  Plan continue clobetazole as prescribed  F/u with Dr. Audie Box

## 2012-12-10 NOTE — Assessment & Plan Note (Signed)
Patient has been on iron for several months. No evidence of any blood loss. Lab Sept 10, '14: Iron 84, % sat 25.1,  Lab Results  Component Value Date   HGB 12.7 12/08/2012      Plan Ok to stop iron supplement

## 2012-12-10 NOTE — Assessment & Plan Note (Signed)
Right displace femoral neck fracture s/p ORIF-right hemiarthroplasty May '14. She was in SNF due to fracture of wrist and hip. She is doing well - will continue home health PT/OT.

## 2012-12-10 NOTE — Telephone Encounter (Signed)
Called pt, lvmom to schedule apt.

## 2012-12-13 MED ORDER — CLOBETASOL PROPIONATE 0.05 % EX CREA: TOPICAL_CREAM | Freq: Two times a day (BID) | CUTANEOUS | Status: DC

## 2012-12-13 NOTE — Telephone Encounter (Signed)
Left message for pt to call regarding the below. 

## 2012-12-13 NOTE — Telephone Encounter (Signed)
Pt will use twice daily, she is requesting refill on cream. Okay to fill?

## 2012-12-13 NOTE — Telephone Encounter (Signed)
Rx sent pt informed

## 2012-12-13 NOTE — Progress Notes (Signed)
Patient ID: [name], female   DOB: 08-Dec-1915, 79 y.o.   MRN: [id number]  [facility]  Allergies  Allergen Reactions  . Aleve [Naproxen Sodium] Swelling    Legs and feet    Chief Complaint  Patient presents with  . Discharge Note    HPI  She is being discharged to home with home health for pt/ot/nursing; she will not need dme. She will ne prescriptions written. She had been hospitalized for a right hip fracture. Was admitted to this facility for short term rehab and is now ready to complete her therapy through home health.   Past Medical History  Diagnosis Date  . History of hiatal hernia   . Mitral valve prolapse   . Plantar fasciitis   . Hay fever   . GERD (gastroesophageal reflux disease)   . Palpitations   . Hypertension   . Neuromuscular disorder   . Deafness     "very HOH; even w/hearing aides we have to communicate via writing things down for her to read" (08/25/2012)  . H/O hiatal hernia   . Arthritis     "a little all over" (08/25/2012)  . Lung mass     Hx: of  . Anxiety     Past Surgical History  Procedure Laterality Date  . Foot surgery Bilateral ~ 2000    "pinched nerve on feet" (08/25/2012)  . Vesicovaginal fistula closure w/ tah    . Abdominal hysterectomy  1998  . Tonsillectomy and adenoidectomy  1940's  . Cesarean section  1952  . Knee arthroscopy w/ meniscectomy Left 07/15/2010    partial medial and lateral meniscectomies.Hattie Perch 07/15/2010 (08/25/2012)  . Hip arthroplasty Right 08/26/2012    Procedure: ARTHROPLASTY BIPOLAR HIP;  Surgeon: Shelda Pal, MD;  Location: [facility] OR;  Service: Orthopedics;  Laterality: Right;  . Open reduction internal fixation (orif) distal radial fracture Right 09/11/2012    Procedure: OPEN REDUCTION INTERNAL FIXATION (ORIF) RIGHT DISTAL RADIAL FRACTURE/ULNAR POSSIBLE BONE GRAFTING;  Surgeon: Sharma Covert, MD;  Location: [facility] OR;  Service: Orthopedics;  Laterality: Right;    Current Outpatient Prescriptions on File  Prior to Visit  Medication Sig Dispense Refill  . Calcium Glycerophosphate (PRELIEF PO) Take 1-2 tablets by mouth daily as needed (as needed for indigestion).       . Cranberry-Vitamin C-Probiotic (AZO CRANBERRY PO) Take 1 tablet by mouth 4 (four) times daily.      . ferrous sulfate 325 (65 FE) MG tablet Take 325 mg by mouth 3 (three) times daily with meals.      Marland Kitchen losartan (COZAAR) 50 MG tablet Take 50 mg by mouth daily.      . methocarbamol (ROBAXIN) 500 MG tablet Take 500 mg by mouth 2 (two) times daily as needed (for muscle spasms).       . metoprolol succinate (TOPROL-XL) 100 MG 24 hr tablet Take 100 mg by mouth daily. Take with or immediately following a meal.      . polyethylene glycol (MIRALAX / GLYCOLAX) packet Take 17 g by mouth as needed (constipation).       Bernadette Hoit Sodium (SENNA S PO) Take 1 tablet by mouth as needed (constipation).        No current facility-administered medications on file prior to visit.    Filed Vitals:   12/13/12 1637  BP: 132/68  Pulse: 70  Height: 5\' 4"  (1.626 m)  Weight: 159 lb (72.122 kg)     LABS REVIEWED:   09-01-12: wbc 13.9;  hgb 10.9; hct 33.8; mcv 94.7; plt 308; glucose 115; bun 21; creat 0.6; k+3.7;na++131 Urine culture k. Pneumoniae: augmentin  09-17-12: urine culture: no growth 10-25-12: wbc 7.8; hgb 12.0; hct 35.3 ;mcv 88.0 plt 276; glucose 119; bun 9; creat 0.67; k+3.8na++136  Review of Systems  Constitutional: Negative for malaise/fatigue.  Respiratory: Negative for cough and sputum production.   Cardiovascular: Negative for chest pain, palpitations and leg swelling.  Gastrointestinal: Negative for heartburn, abdominal pain and constipation.  Genitourinary: Negative for dysuria.  Musculoskeletal: SHE has muscle aches and pains .  Skin: Negative.   Neurological: Negative for headaches.  Psychiatric/Behavioral: Negative for depression. The patient does not have insomnia.     Physical Exam  Constitutional: She is  oriented to person, place, and time. She appears well-developed and well-nourished.  overweight  Neck: Neck supple. No JVD present. No thyromegaly present.  Cardiovascular: Normal rate, regular rhythm and intact distal pulses.   Respiratory: Effort normal. No respiratory distress. She has no wheezes.  GI: Bowel sounds are normal. She exhibits no distension. There is no tenderness.  Musculoskeletal: Normal range of motion. She exhibits no edema.  Neurological: She is alert and oriented to person, place, and time.  Skin: Skin is warm and dry.   ASSESSMENT/PLAN  Will discharge her to home with home health for pt/ot/nursing; she will not need dme. Her prescriptions have been written.    Time spent with patient 35 minutes.

## 2012-12-13 NOTE — Telephone Encounter (Signed)
Okay to refill Temovate cream one tube with 2 refills

## 2012-12-14 ENCOUNTER — Encounter: Payer: Self-pay | Admitting: Internal Medicine

## 2012-12-14 ENCOUNTER — Ambulatory Visit (INDEPENDENT_AMBULATORY_CARE_PROVIDER_SITE_OTHER): Payer: Medicare Other | Admitting: Internal Medicine

## 2012-12-14 VITALS — BP 112/66 | HR 74 | Temp 98.1°F | Wt 157.0 lb

## 2012-12-14 DIAGNOSIS — R911 Solitary pulmonary nodule: Secondary | ICD-10-CM

## 2012-12-15 ENCOUNTER — Other Ambulatory Visit: Payer: Self-pay | Admitting: Internal Medicine

## 2012-12-15 DIAGNOSIS — R911 Solitary pulmonary nodule: Secondary | ICD-10-CM

## 2012-12-15 MED ORDER — CLOBETASOL PROPIONATE 0.05 % EX CREA: TOPICAL_CREAM | Freq: Two times a day (BID) | CUTANEOUS | Status: DC

## 2012-12-15 NOTE — Progress Notes (Signed)
  Subjective:    Patient ID: [name], female    DOB: Apr 30, 1915, 79 y.o.   MRN: [id number]  HPI [name] presents to review labs and Ct scan. She is feeling ok. Denies any SOB, no cough.  PMH, FamHx and SocHx reviewed for any changes and relevance.] Current Outpatient Prescriptions on File Prior to Visit  Medication Sig Dispense Refill  . Calcium Glycerophosphate (PRELIEF PO) Take 1-2 tablets by mouth daily as needed (as needed for indigestion).       . clobetasol cream (TEMOVATE) 0.05 % Apply topically 2 (two) times daily.  30 g  2  . Cranberry-Vitamin C-Probiotic (AZO CRANBERRY PO) Take 1 tablet by mouth 4 (four) times daily.      . ferrous sulfate 325 (65 FE) MG tablet Take 325 mg by mouth 3 (three) times daily with meals.      . furosemide (LASIX) 40 MG tablet Take 40 mg by mouth daily.      Marland Kitchen gabapentin (NEURONTIN) 300 MG capsule Take 300 mg by mouth 2 (two) times daily.      Marland Kitchen HYDROcodone-acetaminophen (NORCO/VICODIN) 5-325 MG per tablet Take 1-2 tablets by mouth every 6 (six) hours as needed for pain.      Marland Kitchen liver oil-zinc oxide (DESITIN) 40 % ointment Apply topically as needed for dry skin.  56.7 g  0  . losartan (COZAAR) 50 MG tablet Take 50 mg by mouth daily.      . methocarbamol (ROBAXIN) 500 MG tablet Take 500 mg by mouth 2 (two) times daily as needed (for muscle spasms).       . metoprolol succinate (TOPROL-XL) 100 MG 24 hr tablet Take 100 mg by mouth daily. Take with or immediately following a meal.      . mirabegron ER (MYRBETRIQ) 50 MG TB24 tablet Take 50 mg by mouth daily.      . polyethylene glycol (MIRALAX / GLYCOLAX) packet Take 17 g by mouth as needed (constipation).       [name] (SENNA S PO) Take 1 tablet by mouth as needed (constipation).       . trimethoprim (TRIMPEX) 100 MG tablet Take 100 mg by mouth 2 (two) times daily.       No current facility-administered medications on file prior to visit.      Review of  Systems System review is negative for any constitutional, cardiac, pulmonary, GI or neuro symptoms or complaints other than as described in the HPI.     Objective:   Physical Exam Filed Vitals:   12/14/12 1642  BP: 112/66  Pulse: 74  Temp: 98.1 F (36.7 C)   Gen'l- very elderly woman who is HOH No exam otherwise.       Assessment & Plan:

## 2012-12-15 NOTE — Telephone Encounter (Signed)
rx was sent on print rather then normal I will resend rx.

## 2012-12-15 NOTE — Assessment & Plan Note (Signed)
Reviewed CT with patient and the persistent nature of solitary spiculated nodule LUL. Explained that this may be cancer. Discussed work up and the options after including no treatment given that she is 79 y/o. She does want a definitive diagnosis before she makes any decisions. Discussion hand written and she took the notes to share with her daughter. Her son was present for the discussion.  Plan PET scan, if positive  IR for percutaneous biopsy.

## 2012-12-15 NOTE — Addendum Note (Signed)
Addended by: Aura Camps on: 12/15/2012 03:52 PM   Modules accepted: Orders

## 2012-12-16 NOTE — Telephone Encounter (Signed)
Hydrocodone called to pharmacy  

## 2012-12-21 ENCOUNTER — Other Ambulatory Visit: Payer: Self-pay | Admitting: Adult Health

## 2012-12-23 ENCOUNTER — Telehealth: Payer: Self-pay

## 2012-12-23 ENCOUNTER — Telehealth: Payer: Self-pay | Admitting: *Deleted

## 2012-12-23 NOTE — Telephone Encounter (Signed)
Have reviewed all office notes since hospitalization and SNF - no bowel or GI problems. She does have vulvar pruritus and a worrisome lung nodule.  More information about GI issues needed.

## 2012-12-23 NOTE — Telephone Encounter (Signed)
Daughter called to see how lone pt should be using the Temovate cream informed her that per TF note it should be"twice daily for one to 2 weeks and then daily for one month. Pt also having some rectal discomfort, has PCP, has Hemorid will call PCP regarding this.

## 2012-12-23 NOTE — Telephone Encounter (Signed)
Phone call from patient's son [place]. He states his mother is requesting a referral to GI doctor for her bowel problems your treating her for. Please advise. Thanks.

## 2012-12-27 ENCOUNTER — Telehealth: Payer: Self-pay | Admitting: Internal Medicine

## 2012-12-27 ENCOUNTER — Ambulatory Visit: Payer: Medicare Other | Admitting: Internal Medicine

## 2012-12-27 NOTE — Telephone Encounter (Signed)
Sorry to be a bother but....the son is an unreliable source. We need to get a report from the patient - probably impossible with her deafness or her daughter.   Thanks

## 2012-12-27 NOTE — Telephone Encounter (Signed)
I spoke to patient's son, he states she thinks it's both internal and external hemorrhoids. Please advise.

## 2012-12-27 NOTE — Telephone Encounter (Signed)
Previous msg sent to Dr Debby Bud regarding the same issue

## 2012-12-27 NOTE — Telephone Encounter (Signed)
Pt called request Dr. Debby Bud to send suppository [facility] to [facility] due to the hemorid. Pt stated that she is using ice and its seem to work for now. Please advise.

## 2012-12-27 NOTE — Telephone Encounter (Signed)
Left message for patients son to call back so can get more information.

## 2012-12-27 NOTE — Telephone Encounter (Signed)
No record of hemorrhoid in the chart. Is it an internal or external hemorrhoid?

## 2012-12-29 ENCOUNTER — Telehealth: Payer: Self-pay | Admitting: Internal Medicine

## 2012-12-29 MED ORDER — HYDROCORTISONE ACETATE 25 MG RE SUPP: 25.0000 mg | Freq: Two times a day (BID) | RECTAL | Status: DC

## 2012-12-29 NOTE — Telephone Encounter (Signed)
Pt is aware to call her pharmacy. °

## 2012-12-29 NOTE — Telephone Encounter (Signed)
[name] called. She thinks her hemorrhoids are internal.  She wants some prescription suppositories for it.  She had some diarrhea Sunday.  She took 2 anti-diarrhea pills and didn't have a bowel movement until today.  She has been using ice in a plastic bag to relieve the hemorrhoid pain.   [facility] [address].

## 2012-12-29 NOTE — Telephone Encounter (Signed)
Anusol HC supp 1 per rectum bid. Rx to pharmacy

## 2013-01-03 ENCOUNTER — Other Ambulatory Visit: Payer: Self-pay

## 2013-01-03 MED ORDER — HYDROCODONE-ACETAMINOPHEN 5-325 MG PO TABS: ORAL_TABLET | ORAL | Status: DC

## 2013-01-03 NOTE — Telephone Encounter (Signed)
Phone call from patient's son [name] stating she needs a refill on her Hydrocodone.

## 2013-01-04 NOTE — Telephone Encounter (Signed)
Patient will be called to pick up script after signed by MD

## 2013-01-06 ENCOUNTER — Encounter (HOSPITAL_COMMUNITY)
Admission: RE | Admit: 2013-01-06 | Discharge: 2013-01-06 | Disposition: A | Discharge status: Home / Self Care | Payer: Medicare Other | Source: Ambulatory Visit | Attending: Internal Medicine | Admitting: Internal Medicine

## 2013-01-06 ENCOUNTER — Encounter (HOSPITAL_COMMUNITY): Payer: Self-pay

## 2013-01-06 DIAGNOSIS — I700 Atherosclerosis of aorta: Secondary | ICD-10-CM | POA: Insufficient documentation

## 2013-01-06 DIAGNOSIS — I708 Atherosclerosis of other arteries: Secondary | ICD-10-CM | POA: Insufficient documentation

## 2013-01-06 DIAGNOSIS — R222 Localized swelling, mass and lump, trunk: Secondary | ICD-10-CM | POA: Insufficient documentation

## 2013-01-06 DIAGNOSIS — I2510 Atherosclerotic heart disease of native coronary artery without angina pectoris: Secondary | ICD-10-CM | POA: Insufficient documentation

## 2013-01-06 DIAGNOSIS — M19019 Primary osteoarthritis, unspecified shoulder: Secondary | ICD-10-CM | POA: Insufficient documentation

## 2013-01-06 DIAGNOSIS — R911 Solitary pulmonary nodule: Secondary | ICD-10-CM

## 2013-01-06 DIAGNOSIS — I77811 Abdominal aortic ectasia: Secondary | ICD-10-CM | POA: Insufficient documentation

## 2013-01-06 MED ORDER — FLUDEOXYGLUCOSE F - 18 (FDG) INJECTION
19.1000 | Freq: Once | INTRAVENOUS | Status: AC | PRN
  Administered 2013-01-06: 19.1 via INTRAVENOUS

## 2013-01-08 ENCOUNTER — Ambulatory Visit: Payer: Medicare Other

## 2013-01-08 ENCOUNTER — Ambulatory Visit (INDEPENDENT_AMBULATORY_CARE_PROVIDER_SITE_OTHER): Payer: Medicare Other | Admitting: Emergency Medicine

## 2013-01-08 ENCOUNTER — Telehealth: Payer: Self-pay | Admitting: Family Medicine

## 2013-01-08 VITALS — BP 156/90 | HR 78 | Temp 99.2°F | Resp 20 | Ht 66.75 in | Wt 157.2 lb

## 2013-01-08 DIAGNOSIS — N390 Urinary tract infection, site not specified: Secondary | ICD-10-CM

## 2013-01-08 DIAGNOSIS — R300 Dysuria: Secondary | ICD-10-CM

## 2013-01-08 LAB — POCT URINALYSIS DIPSTICK
Protein, UA: >300
Spec Grav, UA: 1.015
Urobilinogen, UA: 0.2

## 2013-01-08 LAB — POCT UA - MICROSCOPIC ONLY: Crystals, Ur, HPF, POC: NEGATIVE

## 2013-01-08 MED ORDER — PHENAZOPYRIDINE HCL 200 MG PO TABS: 200.0000 mg | ORAL_TABLET | Freq: Three times a day (TID) | ORAL | Status: DC | PRN

## 2013-01-08 MED ORDER — CIPROFLOXACIN HCL 500 MG PO TABS: 500.0000 mg | ORAL_TABLET | Freq: Two times a day (BID) | ORAL | Status: DC

## 2013-01-08 NOTE — Patient Instructions (Signed)
Urinary Tract Infection  Urinary tract infections (UTIs) can develop anywhere along your urinary tract. Your urinary tract is your body's drainage system for removing wastes and extra water. Your urinary tract includes two kidneys, two ureters, a bladder, and a urethra. Your kidneys are a pair of bean-shaped organs. Each kidney is about the size of your fist. They are located below your ribs, one on each side of your spine.  CAUSES  Infections are caused by microbes, which are microscopic organisms, including fungi, viruses, and bacteria. These organisms are so small that they can only be seen through a microscope. Bacteria are the microbes that most commonly cause UTIs.  SYMPTOMS   Symptoms of UTIs may vary by age and gender of the patient and by the location of the infection. Symptoms in young women typically include a frequent and intense urge to urinate and a painful, burning feeling in the bladder or urethra during urination. Older women and men are more likely to be tired, shaky, and weak and have muscle aches and abdominal pain. A fever may mean the infection is in your kidneys. Other symptoms of a kidney infection include pain in your back or sides below the ribs, nausea, and vomiting.  DIAGNOSIS  To diagnose a UTI, your caregiver will ask you about your symptoms. Your caregiver also will ask to provide a urine sample. The urine sample will be tested for bacteria and white blood cells. White blood cells are made by your body to help fight infection.  TREATMENT   Typically, UTIs can be treated with medication. Because most UTIs are caused by a bacterial infection, they usually can be treated with the use of antibiotics. The choice of antibiotic and length of treatment depend on your symptoms and the type of bacteria causing your infection.  HOME CARE INSTRUCTIONS   If you were prescribed antibiotics, take them exactly as your caregiver instructs you. Finish the medication even if you feel better after you  have only taken some of the medication.   Drink enough water and fluids to keep your urine clear or pale yellow.   Avoid caffeine, tea, and carbonated beverages. They tend to irritate your bladder.   Empty your bladder often. Avoid holding urine for long periods of time.   Empty your bladder before and after sexual intercourse.   After a bowel movement, women should cleanse from front to back. Use each tissue only once.  SEEK MEDICAL CARE IF:    You have back pain.   You develop a fever.   Your symptoms do not begin to resolve within 3 days.  SEEK IMMEDIATE MEDICAL CARE IF:    You have severe back pain or lower abdominal pain.   You develop chills.   You have nausea or vomiting.   You have continued burning or discomfort with urination.  MAKE SURE YOU:    Understand these instructions.   Will watch your condition.   Will get help right away if you are not doing well or get worse.  Document Released: 12/25/2004 Document Revised: 09/16/2011 Document Reviewed: 04/25/2011  Exit[employer] Patient Information 2014 [employer].

## 2013-01-08 NOTE — Progress Notes (Signed)
[facility] [address] [place] [address] [phone]  Date:  01/08/2013   Name:  [name]   DOB:  October 09, 1915   MRN:  [id number]  PCP:  Illene Regulus, MD    Chief Complaint: No chief complaint on file.   History of Present Illness:  [name] is a 79 y.o. very pleasant female patient who presents with the following:  Has dysuria and urgency no fever or chills.  She was thinking that it may be related to using witch hazel on her internal hemorrhoids.  No GI or GYN symptoms.  Eating and drinking normally.  Very hard of hearing even with hearing aids.  No improvement with over the counter medications or other home remedies. Denies other complaint or health concern today.   Patient Active Problem List   Diagnosis Date Noted  . Solitary pulmonary nodule 12/14/2012  . Edema 10/28/2012  . Urinary hesitancy 10/28/2012  . Anemia, iron deficiency 09/06/2012  . Wrist fracture, closed 09/06/2012  . Intertrochanteric fracture of right hip 09/06/2012  . Hallucination 08/20/2012  . Abdominal  pain, other specified site 08/13/2012  . Onychomycosis 08/04/2012  . Pain in joint, ankle and foot 08/04/2012  . Neck pain 06/24/2012  . Vulvar burning 05/29/2012  . Tricompartment degenerative joint disease of knee 12/10/2011  . Tinnitus 10/20/2011  . Hearing loss 08/11/2011  . Dizziness 03/05/2011  . KNEE PAIN 02/14/2010  . HYPERLIPIDEMIA 01/13/2007  . HYPERTENSION 01/13/2007  . HAY FEVER 01/13/2007  . GERD 01/13/2007  . ARTHRITIS 01/13/2007  . PLANTAR FASCIITIS 01/13/2007  . Personal history of unspecified circulatory disease 01/13/2007  . HIATAL HERNIA, HX OF 01/13/2007  . FOOT SURGERY, HX OF 01/13/2007    Past Medical History  Diagnosis Date  . History of hiatal hernia   . Mitral valve prolapse   . Plantar fasciitis   . Hay fever   . GERD (gastroesophageal reflux disease)   . Palpitations   . Hypertension   . Neuromuscular disorder    . Deafness     "very HOH; even w/hearing aides we have to communicate via writing things down for her to read" (08/25/2012)  . H/O hiatal hernia   . Arthritis     "a little all over" (08/25/2012)  . Lung mass     Hx: of  . Anxiety     Past Surgical History  Procedure Laterality Date  . Foot surgery Bilateral ~ 2000    "pinched nerve on feet" (08/25/2012)  . Vesicovaginal fistula closure w/ tah    . Abdominal hysterectomy  1998  . Tonsillectomy and adenoidectomy  1940's  . Cesarean section  1952  . Knee arthroscopy w/ meniscectomy Left 07/15/2010    partial medial and lateral meniscectomies.Hattie Perch 07/15/2010 (08/25/2012)  . Hip arthroplasty Right 08/26/2012    Procedure: ARTHROPLASTY BIPOLAR HIP;  Surgeon: Shelda Pal, MD;  Location: [facility] OR;  Service: Orthopedics;  Laterality: Right;  . Open reduction internal fixation (orif) distal radial fracture Right 09/11/2012    Procedure: OPEN REDUCTION INTERNAL FIXATION (ORIF) RIGHT DISTAL RADIAL FRACTURE/ULNAR POSSIBLE BONE GRAFTING;  Surgeon: Sharma Covert, MD;  Location: [facility] OR;  Service: Orthopedics;  Laterality: Right;    History  Substance Use Topics  . Smoking status: Never Smoker   . Smokeless tobacco: Never Used  . Alcohol Use: No    Family History  Problem Relation Age of Onset  . Colon cancer Mother   . Colon cancer Sister   .  Cancer Sister     Breast  . Colon cancer Brother   . Heart disease Brother     Heart Attack  . Cancer Father     Leukemia    Allergies  Allergen Reactions  . Aleve [Naproxen Sodium] Swelling    Legs and feet    Medication list has been reviewed and updated.  Current Outpatient Prescriptions on File Prior to Visit  Medication Sig Dispense Refill  . Calcium Glycerophosphate (PRELIEF PO) Take 1-2 tablets by mouth daily as needed (as needed for indigestion).       . clobetasol cream (TEMOVATE) 0.05 % Apply topically 2 (two) times daily.  30 g  2  . Cranberry-Vitamin C-Probiotic (AZO CRANBERRY  PO) Take 1 tablet by mouth 4 (four) times daily.      . furosemide (LASIX) 40 MG tablet Take 40 mg by mouth daily.      Marland Kitchen gabapentin (NEURONTIN) 300 MG capsule TAKE ONE CAPSULE TWICE A DAY  60 capsule  0  . HYDROcodone-acetaminophen (NORCO/VICODIN) 5-325 MG per tablet TAKE 1 OR 2 TABS EVERY 6 HOURS FOR PAIN  30 tablet  0  . hydrocortisone (ANUSOL-HC) 25 MG suppository Place 1 suppository (25 mg total) rectally 2 (two) times daily.  12 suppository  0  . losartan (COZAAR) 50 MG tablet Take 50 mg by mouth daily.      . methocarbamol (ROBAXIN) 500 MG tablet Take 500 mg by mouth 2 (two) times daily as needed (for muscle spasms).       . metoprolol succinate (TOPROL-XL) 100 MG 24 hr tablet Take 100 mg by mouth daily. Take with or immediately following a meal.      . polyethylene glycol (MIRALAX / GLYCOLAX) packet Take 17 g by mouth as needed (constipation).       . trimethoprim (TRIMPEX) 100 MG tablet Take 100 mg by mouth 2 (two) times daily.      . ferrous sulfate 325 (65 FE) MG tablet TAKE 1 TABLET BY MOUTH 3 TIMES A DAY  90 tablet  0  . liver oil-zinc oxide (DESITIN) 40 % ointment Apply topically as needed for dry skin.  56.7 g  0  . mirabegron ER (MYRBETRIQ) 50 MG TB24 tablet Take 50 mg by mouth daily.      Bernadette Hoit Sodium (SENNA S PO) Take 1 tablet by mouth as needed (constipation).        No current facility-administered medications on file prior to visit.    Review of Systems:  As per HPI, otherwise negative.    Physical Examination: Filed Vitals:   01/08/13 0848  BP: 156/90  Pulse: 78  Temp: 99.2 F (37.3 C)  Resp: 20   Filed Vitals:   01/08/13 0848  Height: 5' 6.75" (1.695 m)  Weight: 157 lb 3.2 oz (71.305 kg)   Body mass index is 24.82 kg/(m^2). Ideal Body Weight: Weight in (lb) to have BMI = 25: 158.1   GEN: WDWN, NAD, Non-toxic, Alert & Oriented x 3 HEENT: Atraumatic, Normocephalic.  Ears and Nose: No external deformity. EXTR: No  clubbing/cyanosis/edema NEURO: Normal gait.  PSYCH: Normally interactive. Conversant. Not depressed or anxious appearing.  Calm demeanor.  ABD:  Benign, soft not tender Back:  No CVA tenderness  Assessment and Plan: Acute cystitis cipro Pyridium Increase fluids  Signed,  Phillips Odor, MD  Results for orders placed in visit on 01/08/13  POCT UA - MICROSCOPIC ONLY      Result Value Range   WBC, Ur,  HPF, POC tntc     RBC, urine, microscopic 0-4     Bacteria, U Microscopic 3+     Mucus, UA trace     Epithelial cells, urine per micros 0-1     Crystals, Ur, HPF, POC neg     Casts, Ur, LPF, POC Broad, waxy     Yeast, UA neg    POCT URINALYSIS DIPSTICK      Result Value Range   Color, UA yellow     Clarity, UA cloudy     Glucose, UA neg     Bilirubin, UA neg     Ketones, UA neg     Spec Grav, UA 1.015     Blood, UA trace-intact     pH, UA 7.0     Protein, UA >300     Urobilinogen, UA 0.2     Nitrite, UA neg     Leukocytes, UA moderate (2+)

## 2013-01-08 NOTE — Telephone Encounter (Signed)
Call from answering service from pt's son [place] regarding Rx from Dr. Dareen Piano. Cipro and Pyridium Rx for UTI.  Called provided number.  Ms. Wos answered and had some difficulty hearing me with repeated attempts, eventually talked to [facility] and advised ok to not take pyridium as just for sx's, but to continue abx as prescribed by Dr. Dareen Piano.  Understanding expressed.

## 2013-01-09 ENCOUNTER — Telehealth: Payer: Self-pay | Admitting: Family Medicine

## 2013-01-09 DIAGNOSIS — N390 Urinary tract infection, site not specified: Secondary | ICD-10-CM

## 2013-01-09 MED ORDER — CEPHALEXIN 500 MG PO CAPS: 500.0000 mg | ORAL_CAPSULE | Freq: Four times a day (QID) | ORAL | Status: DC

## 2013-01-09 NOTE — Telephone Encounter (Signed)
See phone message last night. Here in office this am with same question.  Worried about warnings on medicines including neuropathies and tendinopathy with quinolone. Chart reviewed, treated multiple times in past for UTI, and at times without infection on culture, but was changed form Septra to Keflex by Dr Jonny Ruiz in May. Will change to Keflex as prescribed prior. Unable to hear me, so dialogue written, and instructions written.  Understanding expressed.

## 2013-01-11 ENCOUNTER — Telehealth: Payer: Self-pay | Admitting: *Deleted

## 2013-01-11 NOTE — Telephone Encounter (Signed)
Ok for refill #90, can have a second script as well.

## 2013-01-11 NOTE — Telephone Encounter (Signed)
Pt's son, [name], called request referral for the doctor that can do lung biopsy. Please advise.

## 2013-01-11 NOTE — Telephone Encounter (Signed)
Clyde, pts son called requesting a refill on Hydrocodone.  Last refill was 10.6.14 for #30.  Please advise

## 2013-01-12 ENCOUNTER — Other Ambulatory Visit: Payer: Self-pay | Admitting: Internal Medicine

## 2013-01-12 DIAGNOSIS — R911 Solitary pulmonary nodule: Secondary | ICD-10-CM

## 2013-01-12 NOTE — Telephone Encounter (Signed)
Entered order for CT biopsy - lung mass.

## 2013-01-13 ENCOUNTER — Telehealth: Payer: Self-pay | Admitting: Internal Medicine

## 2013-01-13 DIAGNOSIS — R911 Solitary pulmonary nodule: Secondary | ICD-10-CM

## 2013-01-13 NOTE — Telephone Encounter (Signed)
01/13/2013   Pt's son called, pt had PET scan done and is now requesting a referral for a lung biopsy to be performed.  Please advise.

## 2013-01-14 NOTE — Telephone Encounter (Signed)
Radiology is requiring a consult from thoracic surgery prior to doing a biopsy - they want to be sure she is a candidate for intervention (surgery) before taking the risk of a biopsy. Personally spoke with the radiologist about this. Referral to [facility] placed.

## 2013-01-17 ENCOUNTER — Other Ambulatory Visit: Payer: Self-pay

## 2013-01-17 MED ORDER — HYDROCODONE-ACETAMINOPHEN 5-325 MG PO TABS: ORAL_TABLET | ORAL | Status: DC

## 2013-01-18 ENCOUNTER — Other Ambulatory Visit: Payer: Self-pay | Admitting: Adult Health

## 2013-01-18 ENCOUNTER — Other Ambulatory Visit: Payer: Self-pay | Admitting: Internal Medicine

## 2013-01-18 ENCOUNTER — Telehealth: Payer: Self-pay | Admitting: Internal Medicine

## 2013-01-18 NOTE — Telephone Encounter (Signed)
Pt wants Dr. Debby Bud to call her with her test results of her scan.

## 2013-01-18 NOTE — Telephone Encounter (Signed)
We had discussed the results of her PET scan at previous visit. It would be hard to tell over the phone given her loss of hearing. Send her the following:  IMPRESSION:  1. Left apical mass with maximum standard uptake value of 3.8,  favoring low grade malignancy. Tissue diagnosis recommended.  2. Equivocal left hilar and right infrahilar nodal hypermetabolic  activity.  3. Atherosclerosis, including the coronary arteries.  4. Chololithiasis.  Likely to be a cancer. Appointment with surgeon pending - radiology wants to be sure that there is a reasonable treatment before undertaking biopsy.  If this is too complicated she should make an appointment to see me and she should bring her daughter

## 2013-01-18 NOTE — Telephone Encounter (Signed)
Info has been mailed to patient

## 2013-01-19 ENCOUNTER — Telehealth: Payer: Self-pay | Admitting: Internal Medicine

## 2013-01-19 NOTE — Telephone Encounter (Signed)
Pt called again.  She wants to know when she is to go to the cancer center for her lungs.  She wants a call from Dr. Debby Bud.   She wants someone to call to let her know when she will hear.  She called the Cancer Center, they told her to call her doctor.

## 2013-01-19 NOTE — Telephone Encounter (Signed)
1. We  Have made a referral to a thoracic surgeon for evaluation. 2. We have not made a referral to the cancer center 3. See previous notes - she should come in, with her daughter, to discuss this and clear the confusion and communication issues.

## 2013-01-20 ENCOUNTER — Other Ambulatory Visit: Payer: Self-pay | Admitting: Adult Health

## 2013-01-20 NOTE — Telephone Encounter (Signed)
I spoke with Ms Corzine's daughter.  She is aware of the appointment with the thoracic surgeon.  I gave her the phone number to that office.  She didn't think they needed an appt here at this time, but will call back if one is needed.

## 2013-01-21 ENCOUNTER — Ambulatory Visit (INDEPENDENT_AMBULATORY_CARE_PROVIDER_SITE_OTHER): Payer: Medicare Other | Admitting: Family Medicine

## 2013-01-21 ENCOUNTER — Telehealth: Payer: Self-pay

## 2013-01-21 VITALS — BP 126/74 | HR 80 | Temp 98.6°F | Resp 18 | Ht 66.25 in | Wt 158.7 lb

## 2013-01-21 DIAGNOSIS — N898 Other specified noninflammatory disorders of vagina: Secondary | ICD-10-CM

## 2013-01-21 DIAGNOSIS — N3281 Overactive bladder: Secondary | ICD-10-CM

## 2013-01-21 DIAGNOSIS — N9489 Other specified conditions associated with female genital organs and menstrual cycle: Secondary | ICD-10-CM

## 2013-01-21 DIAGNOSIS — N318 Other neuromuscular dysfunction of bladder: Secondary | ICD-10-CM

## 2013-01-21 DIAGNOSIS — R300 Dysuria: Secondary | ICD-10-CM

## 2013-01-21 LAB — POCT URINALYSIS DIPSTICK
Bilirubin, UA: NEGATIVE
Blood, UA: NEGATIVE
Glucose, UA: NEGATIVE
Ketones, UA: NEGATIVE
Leukocytes, UA: NEGATIVE
Nitrite, UA: NEGATIVE
Protein, UA: NEGATIVE
Spec Grav, UA: 1.01
Urobilinogen, UA: 0.2
pH, UA: 6.5

## 2013-01-21 LAB — POCT WET PREP WITH KOH
KOH Prep POC: NEGATIVE
Trichomonas, UA: NEGATIVE
Yeast Wet Prep HPF POC: NEGATIVE

## 2013-01-21 LAB — POCT UA - MICROSCOPIC ONLY
Bacteria, U Microscopic: NEGATIVE
Casts, Ur, LPF, POC: NEGATIVE
Crystals, Ur, HPF, POC: NEGATIVE
Mucus, UA: NEGATIVE
WBC, Ur, HPF, POC: NEGATIVE
Yeast, UA: NEGATIVE

## 2013-01-21 NOTE — Telephone Encounter (Signed)
ok 

## 2013-01-21 NOTE — Telephone Encounter (Signed)
Britta Mccreedy has been notified and our fax number was given to her.

## 2013-01-21 NOTE — Telephone Encounter (Signed)
Phone call from [employer], nurse with [facility] [phone] states patient is currently being treated for UTI. She is being discharged next week. Would like a verbal order to obtain a urine specimen before being discharged. Please advise. Thank you.

## 2013-01-21 NOTE — Progress Notes (Signed)
[facility]:  Office Visit  Chief Complaint:  Chief Complaint  Patient presents with  . Follow-up    pt having recheck on UTI     HPI: [name] is a 79 y.o. female who is here for f/u of her UTI which she was recently treated for on 01/08/2013. She was orinally put on cipro by Dr Annia Friendly nd then changed to Keflex after she reade the side effect  profile of fluoroquinolones.She only took 9/10 days then she took the last pill about yesterday after she started having burning sxs again after using witch hazel on her bottom.  She has had burning with urination only, no fevers, chills, No abd or pelvic pain, No blood in urine Had some nausea previously but may be due to abx  History of urinary incontinence due to overactive bladder, was on mirabegron but then stopped since did not think it would work She also was at some point given premarin or estrogen cream for vaginal dryness  Past Medical History  Diagnosis Date  . History of hiatal hernia   . Mitral valve prolapse   . Plantar fasciitis   . Hay fever   . GERD (gastroesophageal reflux disease)   . Palpitations   . Hypertension   . Neuromuscular disorder   . Deafness     "very HOH; even w/hearing aides we have to communicate via writing things down for her to read" (08/25/2012)  . H/O hiatal hernia   . Arthritis     "a little all over" (08/25/2012)  . Lung mass     Hx: of  . Anxiety    Past Surgical History  Procedure Laterality Date  . Foot surgery Bilateral ~ 2000    "pinched nerve on feet" (08/25/2012)  . Vesicovaginal fistula closure w/ tah    . Abdominal hysterectomy  1998  . Tonsillectomy and adenoidectomy  1940's  . Cesarean section  1952  . Knee arthroscopy w/ meniscectomy Left 07/15/2010    partial medial and lateral meniscectomies.Hattie Perch 07/15/2010 (08/25/2012)  . Hip arthroplasty Right 08/26/2012    Procedure: ARTHROPLASTY BIPOLAR HIP;  Surgeon: Shelda Pal, MD;  Location: [facility] OR;   Service: Orthopedics;  Laterality: Right;  . Open reduction internal fixation (orif) distal radial fracture Right 09/11/2012    Procedure: OPEN REDUCTION INTERNAL FIXATION (ORIF) RIGHT DISTAL RADIAL FRACTURE/ULNAR POSSIBLE BONE GRAFTING;  Surgeon: Sharma Covert, MD;  Location: [facility] OR;  Service: Orthopedics;  Laterality: Right;   History   Social History  . Marital Status: Widowed    Spouse Name: N/A    Number of Children: N/A  . Years of Education: N/A   Social History Main Topics  . Smoking status: Never Smoker   . Smokeless tobacco: Never Used  . Alcohol Use: No  . Drug Use: No  . Sexual Activity: No   Other Topics Concern  . None   Social History Narrative   Married 47 years - widowed 1996   1 son, 1 daughter 2 grandsons   Lives in her own home, son lives with her. Tries to walk every day. Starting to get out more after ortho injury. I- ADLS.   End-of-life issues. DNR and no heroic or extraordinary measures.   Family History  Problem Relation Age of Onset  . Colon cancer Mother   . Colon cancer Sister   . Cancer Sister     Breast  . Colon cancer Brother   . Heart disease Brother  Heart Attack  . Cancer Father     Leukemia   Allergies  Allergen Reactions  . Aleve [Naproxen Sodium] Swelling    Legs and feet   Prior to Admission medications   Medication Sig Start Date End Date Taking? Authorizing Provider  Calcium Glycerophosphate (PRELIEF PO) Take 1-2 tablets by mouth daily as needed (as needed for indigestion).    Yes Historical Provider, MD  cephALEXin (KEFLEX) 500 MG capsule Take 1 capsule (500 mg total) by mouth 4 (four) times daily. 01/09/13  Yes Shade Flood, MD  clobetasol cream (TEMOVATE) 0.05 % Apply topically 2 (two) times daily. 12/15/12  Yes Dara Lords, MD  Cranberry-Vitamin C-Probiotic (AZO CRANBERRY PO) Take 1 tablet by mouth 4 (four) times daily.   Yes Historical Provider, MD  ferrous sulfate 325 (65 FE) MG tablet TAKE 1 TABLET BY MOUTH  3 TIMES A DAY 12/21/12  Yes Jacques Navy, MD  furosemide (LASIX) 40 MG tablet Take 40 mg by mouth daily.   Yes Historical Provider, MD  gabapentin (NEURONTIN) 300 MG capsule TAKE ONE CAPSULE TWICE A DAY 01/18/13  Yes Jacques Navy, MD  HYDROcodone-acetaminophen (NORCO/VICODIN) 5-325 MG per tablet TAKE 1 OR 2 TABS EVERY 6 HOURS FOR PAIN 01/17/13  Yes Jacques Navy, MD  hydrocortisone (ANUSOL-HC) 25 MG suppository Place 1 suppository (25 mg total) rectally 2 (two) times daily. 12/29/12  Yes Jacques Navy, MD  liver oil-zinc oxide (DESITIN) 40 % ointment Apply topically as needed for dry skin. 11/28/12  Yes Glynn Octave, MD  losartan (COZAAR) 50 MG tablet Take 50 mg by mouth daily.   Yes Historical Provider, MD  methocarbamol (ROBAXIN) 500 MG tablet Take 500 mg by mouth 2 (two) times daily as needed (for muscle spasms).    Yes Historical Provider, MD  metoprolol succinate (TOPROL-XL) 100 MG 24 hr tablet TAKE 1 TABLET BY MOUTH EVERY DAY 01/20/13  Yes Jacques Navy, MD  mirabegron ER (MYRBETRIQ) 50 MG TB24 tablet Take 50 mg by mouth daily.   Yes Historical Provider, MD  polyethylene glycol (MIRALAX / GLYCOLAX) packet Take 17 g by mouth as needed (constipation).    Yes Historical Provider, MD  Sennosides-Docusate Sodium (SENNA S PO) Take 1 tablet by mouth as needed (constipation).    Yes Historical Provider, MD  trimethoprim (TRIMPEX) 100 MG tablet Take 100 mg by mouth 2 (two) times daily.   Yes Historical Provider, MD     ROS: The patient denies fevers, chills, night sweats, unintentional weight loss, chest pain, palpitations, wheezing, dyspnea on exertion, , vomiting, abdominal pain, , hematuria, melena, numbness, weakness, or tingling.   All other systems have been reviewed and were otherwise negative with the exception of those mentioned in the HPI and as above.    PHYSICAL EXAM: Filed Vitals:   01/21/13 1653  BP: 126/74  Pulse: 80  Temp: 98.6 F (37 C)  Resp: 18   Filed  Vitals:   01/21/13 1653  Height: 5' 6.25" (1.683 m)  Weight: 158 lb 11.2 oz (71.986 kg)   Body mass index is 25.41 kg/(m^2).  General: Alert, no acute distress HEENT:  Normocephalic, atraumatic, oropharynx patent. EOMI, PERRLA Cardiovascular:  Regular rate and rhythm, no rubs murmurs or gallops.  No Carotid bruits, radial pulse intact. No pedal edema.  Respiratory: Clear to auscultation bilaterally.  No wheezes, rales, or rhonchi.  No cyanosis, no use of accessory musculature GI: No organomegaly, abdomen is soft and non-tender, positive bowel sounds.  No  masses. Skin: No rashes. Neurologic: Facial musculature symmetric. Psychiatric: Patient is appropriate throughout our interaction. Lymphatic: No cervical lymphadenopathy Musculoskeletal: Gait intact with cane Gu-no appreciable lesions, masses, she has a prolapsed bladder, she has no scratches or irritation along the labia, labia and vagina looks normal She is wearing a depends underwear and upon exam there is constant urinary dribble/leakage, the skin around the perineum appears to be intact without any e/o ulcers  LABS: Results for orders placed in visit on 01/21/13  POCT URINALYSIS DIPSTICK      Result Value Range   Color, UA yellow     Clarity, UA clear     Glucose, UA neg     Bilirubin, UA neg     Ketones, UA neg     Spec Grav, UA 1.010     Blood, UA neg     pH, UA 6.5     Protein, UA neg     Urobilinogen, UA 0.2     Nitrite, UA neg     Leukocytes, UA Negative    POCT UA - MICROSCOPIC ONLY      Result Value Range   WBC, Ur, HPF, POC neg     RBC, urine, microscopic 0-1     Bacteria, U Microscopic neg     Mucus, UA neg     Epithelial cells, urine per micros 0-1     Crystals, Ur, HPF, POC neg     Casts, Ur, LPF, POC neg     Yeast, UA neg    POCT WET PREP WITH KOH      Result Value Range   Trichomonas, UA Negative     Clue Cells Wet Prep HPF POC 0-1     Epithelial Wet Prep HPF POC 0-5     Yeast Wet Prep HPF POC neg      Bacteria Wet Prep HPF POC 1+     RBC Wet Prep HPF POC 4-7     WBC Wet Prep HPF POC 5-10     KOH Prep POC Negative       EKG/XRAY:   Primary read interpreted by Dr. Conley Rolls at [facility].   ASSESSMENT/PLAN: Encounter Diagnoses  Name Primary?  . Dysuria Yes  . Vaginal dryness    Mrs Buczek is a very pleasant, hard of hearing 79 year old caucasian woman who is here for a recheck of her UTI.  She recently completed her 10 day course of keflex and is still having intermittent dysuria. Her UA today was normal. She does have a history of urinary incontinence and vaginal dryness and has been off of her overactive bladder medicine and premarin cream I don't suspect that she has a UTI at this time, on exam she has a prolapsed bladder and there is constant dribbling of urine, I advise that she retake her overactive bladder medicine and try using destin cream to prevent irritation along the labia and perineum and prevent ulcers from developing due to the urine leakage. We will do this for 2 weeks, she can see if this helps and let her PCP know or return to our office.  F/u in 2 weeks with PCP/[facility] prn Gross sideeffects, risk and benefits, and alternatives of medications d/w patient. Patient is aware that all medications have potential sideeffects and we are unable to predict every sideeffect or drug-drug interaction that may occur.  Hamilton Capri PHUONG, DO 01/21/2013 6:32 PM

## 2013-01-24 ENCOUNTER — Other Ambulatory Visit: Payer: Self-pay | Admitting: Adult Health

## 2013-01-26 ENCOUNTER — Encounter: Payer: Self-pay | Admitting: Surgery

## 2013-01-26 ENCOUNTER — Institutional Professional Consult (permissible substitution) (INDEPENDENT_AMBULATORY_CARE_PROVIDER_SITE_OTHER): Payer: Medicare Other | Admitting: Surgery

## 2013-01-26 ENCOUNTER — Telehealth: Payer: Self-pay | Admitting: Internal Medicine

## 2013-01-26 VITALS — BP 131/77 | HR 84 | Resp 19 | Ht 68.0 in | Wt 156.0 lb

## 2013-01-26 DIAGNOSIS — K649 Unspecified hemorrhoids: Secondary | ICD-10-CM

## 2013-01-26 DIAGNOSIS — R911 Solitary pulmonary nodule: Secondary | ICD-10-CM

## 2013-01-26 NOTE — Telephone Encounter (Signed)
Ok for referral to GI - pcc notified

## 2013-01-26 NOTE — Telephone Encounter (Signed)
Pt request referral to hemorrhoid specialist. Please advise. Pt stated that she talk to Dr. Debby Bud about this problem before.

## 2013-01-28 ENCOUNTER — Encounter: Payer: Self-pay | Admitting: Surgery

## 2013-01-28 NOTE — Progress Notes (Signed)
[address]       [name] [address]             [phone]      Cardiothoracic Surgery Consultation   PCP is Illene Regulus, MD Referring Provider is Norins, Rosalyn Gess, MD  Chief Complaint  Patient presents with  . Lung Lesion    eval and treat...CT CHEST 12/08/12.Marland KitchenMarland KitchenPET 01/06/13    HPI:  The patient is a 79 year old woman who lives at home with her disabled son who suffered a fall in May 2014 after tripping over her son. She suffered a displaced right femoral neck fracture and a right wrist fracture. She had a CT scan of the neck done that showed a left apical lung mass. A chest CT on 08/25/2012 showed an irregular mass measuring 3.7 x 2.0 x 2.2 cm. She had her hip repaired in May and then her wrist repaired in June. A follow up CT of the chest on 12/08/2012 showed the irregular LUL mass to be unchanged. A PET scan on 01/06/2013 showed no change in the size of the mass but it did have some patchy hypermetabolic uptake with an SUV of 3.8. There was equivocal left hilar and right infrahilar nodal activity with SUV of 4.8 and 5.3, respectively. The background activity was 4.0. She has been a life-long nonsmoker.  Past Medical History  Diagnosis Date  . History of hiatal hernia   . Mitral valve prolapse   . Plantar fasciitis   . Hay fever   . GERD (gastroesophageal reflux disease)   . Palpitations   . Hypertension   . Neuromuscular disorder   . Deafness     "very HOH; even w/hearing aides we have to communicate via writing things down for her to read" (08/25/2012)  . H/O hiatal hernia   . Arthritis     "a little all over" (08/25/2012)  . Lung mass     Hx: of  . Anxiety     Past Surgical History  Procedure Laterality Date  . Foot surgery Bilateral ~ 2000    "pinched nerve on feet" (08/25/2012)  . Vesicovaginal fistula closure w/ tah    . Abdominal hysterectomy  1998  . Tonsillectomy and adenoidectomy  1940's  . Cesarean section  1952  . Knee  arthroscopy w/ meniscectomy Left 07/15/2010    partial medial and lateral meniscectomies.Hattie Perch 07/15/2010 (08/25/2012)  . Hip arthroplasty Right 08/26/2012    Procedure: ARTHROPLASTY BIPOLAR HIP;  Surgeon: Shelda Pal, MD;  Location: [facility] OR;  Service: Orthopedics;  Laterality: Right;  . Open reduction internal fixation (orif) distal radial fracture Right 09/11/2012    Procedure: OPEN REDUCTION INTERNAL FIXATION (ORIF) RIGHT DISTAL RADIAL FRACTURE/ULNAR POSSIBLE BONE GRAFTING;  Surgeon: Sharma Covert, MD;  Location: [facility] OR;  Service: Orthopedics;  Laterality: Right;    Family History  Problem Relation Age of Onset  . Colon cancer Mother   . Colon cancer Sister   . Cancer Sister     Breast  . Colon cancer Brother   . Heart disease Brother     Heart Attack  . Cancer Father     Leukemia    Social History History  Substance Use Topics  . Smoking status: Never Smoker   . Smokeless tobacco: Never Used  . Alcohol Use: No    Current Outpatient Prescriptions  Medication Sig Dispense Refill  . Calcium Glycerophosphate (PRELIEF PO) Take 1-2 tablets by mouth daily as needed (as  needed for indigestion).       . clobetasol cream (TEMOVATE) 0.05 % Apply topically 2 (two) times daily.  30 g  2  . Cranberry-Vitamin C-Probiotic (AZO CRANBERRY PO) Take 1 tablet by mouth 4 (four) times daily.      . ferrous sulfate 325 (65 FE) MG tablet TAKE 1 TABLET BY MOUTH 3 TIMES A DAY  90 tablet  0  . furosemide (LASIX) 40 MG tablet Take 40 mg by mouth daily.      Marland Kitchen gabapentin (NEURONTIN) 300 MG capsule TAKE ONE CAPSULE TWICE A DAY  60 capsule  5  . HYDROcodone-acetaminophen (NORCO/VICODIN) 5-325 MG per tablet TAKE 1 OR 2 TABS EVERY 6 HOURS FOR PAIN  90 tablet  0  . hydrocortisone (ANUSOL-HC) 25 MG suppository Place 1 suppository (25 mg total) rectally 2 (two) times daily.  12 suppository  0  . KLOR-CON M20 20 MEQ tablet       . losartan (COZAAR) 50 MG tablet Take 50 mg by mouth daily.      . methocarbamol  (ROBAXIN) 500 MG tablet Take 500 mg by mouth 2 (two) times daily as needed (for muscle spasms).       . metoprolol succinate (TOPROL-XL) 100 MG 24 hr tablet TAKE 1 TABLET BY MOUTH EVERY DAY  30 tablet  0  . mirabegron ER (MYRBETRIQ) 50 MG TB24 tablet Take 50 mg by mouth daily.      . polyethylene glycol (MIRALAX / GLYCOLAX) packet Take 17 g by mouth as needed (constipation).       Bernadette Hoit Sodium (SENNA S PO) Take 1 tablet by mouth as needed (constipation).        No current facility-administered medications for this visit.    Allergies  Allergen Reactions  . Aleve [Naproxen Sodium] Swelling    Legs and feet    Review of Systems  Constitutional: Negative for fever, chills, activity change, appetite change, fatigue and unexpected weight change.  HENT: Positive for hearing loss and tinnitus.   Eyes: Negative.   Respiratory: Negative for chest tightness and shortness of breath.        Occasional cough with clear mucous.  Cardiovascular: Negative.   Gastrointestinal: Negative.   Endocrine: Negative.   Genitourinary: Negative.   Musculoskeletal: Negative.   Skin: Negative.   Allergic/Immunologic: Negative.   Neurological: Negative.   Hematological: Negative.   Psychiatric/Behavioral: Negative.     BP 131/77  Pulse 84  Resp 19  Ht 5\' 8"  (1.727 m)  Wt 156 lb (70.761 kg)  BMI 23.73 kg/m2  SpO2 95% Physical Exam  Constitutional: She is oriented to person, place, and time.  Elderly, well-developed female in no distress. Can't hear much of anything. Her daughter writes on a white board to communicate.  HENT:  Head: Normocephalic and atraumatic.  Mouth/Throat: Oropharynx is clear and moist.  Eyes: EOM are normal. Pupils are equal, round, and reactive to light.  Neck: Normal range of motion. Neck supple. No thyromegaly present.  Cardiovascular: Normal rate, regular rhythm, normal heart sounds and intact distal pulses.  Exam reveals no gallop and no friction rub.   No  murmur heard. Pulmonary/Chest: Effort normal and breath sounds normal. No respiratory distress. She has no rales. She exhibits no tenderness.  Abdominal: Bowel sounds are normal. She exhibits no distension and no mass. There is no tenderness.  Musculoskeletal: Normal range of motion. She exhibits no edema.  Lymphadenopathy:    She has no cervical adenopathy.  Neurological: She is  alert and oriented to person, place, and time. She has normal strength. No cranial nerve deficit or sensory deficit.  Skin: Skin is warm and dry.  Psychiatric: She has a normal mood and affect.     Diagnostic Tests:  *RADIOLOGY REPORT*  Clinical Data: Follow-up pulmonary nodule.  CT CHEST WITHOUT CONTRAST  12/08/2012 Technique: Multidetector CT imaging of the chest was performed  following the standard protocol without IV contrast.  Comparison: 08/25/2012.  Findings: No pathologically enlarged mediastinal, hilar or axillary  lymph nodes. Atherosclerotic calcification of the arterial  vasculature, including coronary arteries. Heart is at the upper  limits of normal in size. Pulmonary arteries are enlarged.  Irregular mass-like opacity in the apical aspect of the left upper  lobe measures 2.4 x 4.4 cm, likely stable when remeasured.  Minimal, if any, surrounding inflammatory change. Note is made  that respiratory motion degrades image quality. 4 mm subpleural  right upper lobe nodule is unchanged. Possible subpleural scarring  or atelectasis in both lower lobes, right greater than left. No  pleural fluid. Airway is unremarkable.  Incidental imaging of the upper abdomen shows stones layering in  the gallbladder. No worrisome lytic or sclerotic lesions.  IMPRESSION:  1. Irregular left upper lobe nodule, unchanged from 08/25/2012 and  highly worrisome for primary bronchogenic carcinoma. Pulmonary  medicine or thoracic surgery consultation is recommended, as  clinically indicated given the patient's age. These  results will  be called to the ordering clinician or representative by the  Radiologist Assistant, and communication documented in the PACS  Dashboard.  2. Pulmonary arterial enlargement, indicative of pulmonary  arterial hypertension.  3. Cholelithiasis.  Original Report Authenticated By: Leanna Battles, M.D.   CLINICAL DATA: Solitary pulmonary nodule in the left lung apex.  EXAM:  NUCLEAR MEDICINE PET SKULL BASE TO THIGH  01/06/2013 FASTING BLOOD GLUCOSE: Value: 113 mg/dl  TECHNIQUE:  16.1 mCi W-96 FDG was injected intravenously. CT data was obtained  and used for attenuation correction and anatomic localization only.  (This was not acquired as a diagnostic CT examination.) Additional  exam technical data entered on technologist worksheet.  COMPARISON: 12/08/2012  FINDINGS:  NECK  No hypermetabolic lymph nodes in the neck. 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  CHEST  The 4.4 x 2.2 cm left apical mass has a maximum standard uptake  value of 3.8, favoring malignancy. Equivocal left hilar and right  infrahilar nodal activity with maximum standard uptake value of 4.8  and 5.3, respectively (background mediastinal activity is about  4.0).  Coronary artery atherosclerosis noted.  ABDOMEN/PELVIS  Numerous tiny gallstones in the mildly distended gallbladder. No  biliary dilatation noted.  Infrarenal abdominal aortic ectasia at 2.7 cm. Aortoiliac  atherosclerotic calcification noted.  SKELETON  Right hip implant. Prominent degenerative arthropathy of the right  glenohumeral joint with chronic partial collapse of the right  humeral head.  IMPRESSION:  1. Left apical mass with maximum standard uptake value of 3.8,  favoring low grade malignancy. Tissue diagnosis recommended.  2. Equivocal left hilar and right infrahilar nodal hypermetabolic  activity.  3. Atherosclerosis, including the coronary arteries.  4. Chololithiasis.  Electronically Signed  By: Herbie Baltimore M.D.  On:  01/06/2013 13:26   Impression:  She has an irregular left upper lobe mass-like density that was first noted in May 2014 and is unchanged in size on her recent PET scan. It does have hypermetabolic activity that is patchy within the mass and the SUV is on the low end of significant at  3.8. She also has some equivocal hypermetabolism in both hilar regions. At almost 79 years of age she is not a candidate for a left upper lobectomy and removal of the hilar lymph nodes. This lesion could be resected with a wedge resection but would require a thoracotomy to do it safely and would still be a high risk operation in this elderly woman. The other question is whether she has hilar lymph node mets if this is a lung cancer. That would preclude surgical resection. She would require EBUS for hilar lymph node biopsies to determine this before any surgical resection. She is strongly against chemotherapy or radiation therapy so doing a CT guided needle biopsy of the lung mass is not warranted. This lesion has been stable in size since May so I think it is reasonable to follow it a little while longer to look for further change. It is possible that this is a benign inflammatory lesion. I discussed all of this with her and her daughter who wrote everything down for her. They are in agreement with continuing to follow this for now.  Plan:  I will see her back in 4 months with a CT scan of the chest.

## 2013-02-01 ENCOUNTER — Encounter: Payer: Self-pay | Admitting: Internal Medicine

## 2013-02-01 ENCOUNTER — Ambulatory Visit (INDEPENDENT_AMBULATORY_CARE_PROVIDER_SITE_OTHER): Payer: Medicare Other | Admitting: Internal Medicine

## 2013-02-01 VITALS — BP 160/92 | HR 94 | Temp 98.3°F | Wt 155.8 lb

## 2013-02-01 DIAGNOSIS — I10 Essential (primary) hypertension: Secondary | ICD-10-CM

## 2013-02-01 DIAGNOSIS — N949 Unspecified condition associated with female genital organs and menstrual cycle: Secondary | ICD-10-CM

## 2013-02-01 DIAGNOSIS — R911 Solitary pulmonary nodule: Secondary | ICD-10-CM

## 2013-02-01 DIAGNOSIS — K649 Unspecified hemorrhoids: Secondary | ICD-10-CM

## 2013-02-01 DIAGNOSIS — N9489 Other specified conditions associated with female genital organs and menstrual cycle: Secondary | ICD-10-CM

## 2013-02-01 MED ORDER — CYCLOBENZAPRINE HCL 5 MG PO TABS: 5.0000 mg | ORAL_TABLET | Freq: Three times a day (TID) | ORAL | Status: DC | PRN

## 2013-02-01 NOTE — Progress Notes (Signed)
Subjective:    Patient ID: [name], female    DOB: [birth year], 79 y.o.   MRN: [id number]  HPI Mres. Vallance in the interval has seen Dr. Laneta Simmers - his full not was reviewed. The plan is watchful waiting over the next 4 months. Overall she is not a good candidate for surgery.   Vulvar burning: 10_+ visits over all. Since seeing Dr. Audie Box 8/5 she has had 8 visits to him or ED. Still with burning - taking no medication or using creams.  OAB - has seen Urology - has failed anticholinesterase drugs and myrbetriq to which she had a body-wide burning reaction.  She is here primarily for cramping in the feet. She is taking gabapentin 600 mg BID.  Hemorrhoids - patient complains of painful hemorrhoids  Past Medical History  Diagnosis Date  . History of hiatal hernia   . Mitral valve prolapse   . Plantar fasciitis   . Hay fever   . GERD (gastroesophageal reflux disease)   . Palpitations   . Hypertension   . Neuromuscular disorder   . Deafness     "very HOH; even w/hearing aides we have to communicate via writing things down for her to read" (08/25/2012)  . H/O hiatal hernia   . Arthritis     "a little all over" (08/25/2012)  . Lung mass     Hx: of  . Anxiety    Past Surgical History  Procedure Laterality Date  . Foot surgery Bilateral ~ 2000    "pinched nerve on feet" (08/25/2012)  . Vesicovaginal fistula closure w/ tah    . Abdominal hysterectomy  1998  . Tonsillectomy and adenoidectomy  1940's  . Cesarean section  1952  . Knee arthroscopy w/ meniscectomy Left 07/15/2010    partial medial and lateral meniscectomies.Hattie Perch 07/15/2010 (08/25/2012)  . Hip arthroplasty Right 08/26/2012    Procedure: ARTHROPLASTY BIPOLAR HIP;  Surgeon: Shelda Pal, MD;  Location: [facility] OR;  Service: Orthopedics;  Laterality: Right;  . Open reduction internal fixation (orif) distal radial fracture Right 09/11/2012    Procedure: OPEN REDUCTION INTERNAL FIXATION (ORIF) RIGHT DISTAL RADIAL  FRACTURE/ULNAR POSSIBLE BONE GRAFTING;  Surgeon: Sharma Covert, MD;  Location: [facility] OR;  Service: Orthopedics;  Laterality: Right;   Family History  Problem Relation Age of Onset  . Colon cancer Mother   . Colon cancer Sister   . Cancer Sister     Breast  . Colon cancer Brother   . Heart disease Brother     Heart Attack  . Cancer Father     Leukemia   History   Social History  . Marital Status: Widowed    Spouse Name: N/A    Number of Children: N/A  . Years of Education: N/A   Occupational History  . Not on file.   Social History Main Topics  . Smoking status: Never Smoker   . Smokeless tobacco: Never Used  . Alcohol Use: No  . Drug Use: No  . Sexual Activity: No   Other Topics Concern  . Not on file   Social History Narrative   Married 47 years - widowed 1996   1 son, 1 daughter 2 grandsons   Lives in her own home, son lives with her. Tries to walk every day. Starting to get out more after ortho injury. I- ADLS.   End-of-life issues. DNR and no heroic or extraordinary measures.    Current Outpatient Prescriptions on File Prior to Visit  Medication Sig Dispense  Refill  . Calcium Glycerophosphate (PRELIEF PO) Take 1-2 tablets by mouth daily as needed (as needed for indigestion).       . clobetasol cream (TEMOVATE) 0.05 % Apply topically 2 (two) times daily.  30 g  2  . Cranberry-Vitamin C-Probiotic (AZO CRANBERRY PO) Take 1 tablet by mouth 4 (four) times daily.      . ferrous sulfate 325 (65 FE) MG tablet TAKE 1 TABLET BY MOUTH 3 TIMES A DAY  90 tablet  0  . furosemide (LASIX) 40 MG tablet Take 40 mg by mouth daily.      Marland Kitchen gabapentin (NEURONTIN) 300 MG capsule TAKE ONE CAPSULE TWICE A DAY  60 capsule  5  . HYDROcodone-acetaminophen (NORCO/VICODIN) 5-325 MG per tablet TAKE 1 OR 2 TABS EVERY 6 HOURS FOR PAIN  90 tablet  0  . hydrocortisone (ANUSOL-HC) 25 MG suppository Place 1 suppository (25 mg total) rectally 2 (two) times daily.  12 suppository  0  . KLOR-CON M20 20  MEQ tablet       . losartan (COZAAR) 50 MG tablet Take 50 mg by mouth daily.      . methocarbamol (ROBAXIN) 500 MG tablet Take 500 mg by mouth 2 (two) times daily as needed (for muscle spasms).       . metoprolol succinate (TOPROL-XL) 100 MG 24 hr tablet TAKE 1 TABLET BY MOUTH EVERY DAY  30 tablet  0  . mirabegron ER (MYRBETRIQ) 50 MG TB24 tablet Take 50 mg by mouth daily.      . polyethylene glycol (MIRALAX / GLYCOLAX) packet Take 17 g by mouth as needed (constipation).       Bernadette Hoit Sodium (SENNA S PO) Take 1 tablet by mouth as needed (constipation).        No current facility-administered medications on file prior to visit.      Review of Systems System review is negative for any constitutional, cardiac, pulmonary, GI or neuro symptoms or complaints other than as described in the HPI.     Objective:   Physical Exam Filed Vitals:   02/01/13 1424  BP: 160/92  Pulse: 94  Temp: 98.3 F (36.8 C)   gen'l- WNWD well preserved. Very HOH Cor - RRR Pulm - normal respirations Neuor - A&O, normal gait, HOH Defers rectal exam      Assessment & Plan:  Hemorrhoids - patient c/o painful hemorrhoids. She defers exam.  Plan Per patient request referral to GI for evaluation and treatment.

## 2013-02-01 NOTE — Progress Notes (Signed)
Pre-visit discussion using our clinic review tool. No additional management support is needed unless otherwise documented below in the visit note.  

## 2013-02-01 NOTE — Patient Instructions (Signed)
1. Vulvar burning - you have been seen for this more than 12 times; since seeing Dr. Audie Box in August 5th you have had 8 visit for associated symptoms.   At this point I do not know what else to do.  2. Lung mass - read Dr. Sharee Pimple note - watchful waiting for 4 months with repeat CT scan at that time.  3. Overactive bladder - urology has done their best. You cannot take a medication that causes you so much discomfort.   Plan You may want to reconsult the urologist  4. Foot pain/cramps despite being on gabapenitin.  Plan  will start cyclobenzaprine 5 mg twice a day - a medicine for muscle spasm.   5. Hemorrhoids - an order is placed for an appointment with [facility], 3rd floor this building, as soon as possible for evaluation and treatment of hemorrhoids.

## 2013-02-02 NOTE — Assessment & Plan Note (Signed)
Reviewed Dr. Sharee Pimple consult note - agree with plan to watch for 4 months and repeat CT chest. She is not a candidate for surgical intervention and is opposed to undergoing any chemo or radiation therapy.

## 2013-02-02 NOTE — Assessment & Plan Note (Signed)
BP Readings from Last 3 Encounters:  02/01/13 160/92  01/26/13 131/77  01/21/13 126/74   No change in medication

## 2013-02-02 NOTE — Assessment & Plan Note (Signed)
Continue problem. Since seeing Dr. Audie Box August 5th, '14 she has had 8 evaluations for this problem! She has stopped topical creams, stopped topical estrogens. She is now using vaseline. Her complaints of discomfort are undiminished.     Plan At this point I do not know what else to do.  Consider return to Dr. Audie Box

## 2013-02-03 ENCOUNTER — Other Ambulatory Visit: Payer: Self-pay | Admitting: Internal Medicine

## 2013-02-03 ENCOUNTER — Other Ambulatory Visit: Payer: Self-pay

## 2013-02-04 ENCOUNTER — Encounter: Payer: Self-pay | Admitting: Gastroenterology

## 2013-02-07 ENCOUNTER — Other Ambulatory Visit: Payer: Self-pay | Admitting: Internal Medicine

## 2013-02-07 ENCOUNTER — Other Ambulatory Visit: Payer: Self-pay

## 2013-02-07 MED ORDER — SERTRALINE HCL 50 MG PO TABS: 50.0000 mg | ORAL_TABLET | Freq: Every day | ORAL | Status: DC

## 2013-02-07 MED ORDER — HYDROCODONE-ACETAMINOPHEN 5-325 MG PO TABS: ORAL_TABLET | ORAL | Status: DC

## 2013-02-07 NOTE — Telephone Encounter (Signed)
Opened in error

## 2013-02-18 ENCOUNTER — Other Ambulatory Visit: Payer: Self-pay | Admitting: Internal Medicine

## 2013-02-21 ENCOUNTER — Telehealth: Payer: Self-pay

## 2013-02-21 NOTE — Telephone Encounter (Signed)
OK for referral to [facility]. I am happy to co-manage with [facility] docs.

## 2013-02-21 NOTE — Telephone Encounter (Signed)
Bronson Ing has been notified referral okay with Dr Debby Bud co-managing.

## 2013-02-21 NOTE — Telephone Encounter (Signed)
Phone call from [place] [phone] at [facility] states patient came to them wanting to be admitted. Bronson Ing is requesting a referral order. Will you be attending? Symptom management from hospice doc? Please advise.

## 2013-03-02 ENCOUNTER — Telehealth: Payer: Self-pay | Admitting: Internal Medicine

## 2013-03-02 NOTE — Telephone Encounter (Signed)
Is it okay for a Gabapentin increase?? Please advise.

## 2013-03-02 NOTE — Telephone Encounter (Signed)
Left a message for patient stating she can increase Gabapentin to 300 mg TID. Med list reflects this.

## 2013-03-02 NOTE — Telephone Encounter (Signed)
Pt request a call from Dr. Debby Bud assistant. Pt request Gabapentin to be increase. Please advise.

## 2013-03-02 NOTE — Telephone Encounter (Signed)
May increase gabapentin to 300 mg tid

## 2013-03-07 ENCOUNTER — Telehealth: Payer: Self-pay

## 2013-03-07 ENCOUNTER — Ambulatory Visit: Payer: Medicare Other | Admitting: Gastroenterology

## 2013-03-07 MED ORDER — HYDROCODONE-ACETAMINOPHEN 5-325 MG PO TABS: ORAL_TABLET | ORAL | Status: DC

## 2013-03-07 NOTE — Telephone Encounter (Signed)
Ok for hydrocodone refill - 3 scripts is ok

## 2013-03-07 NOTE — Telephone Encounter (Signed)
Other called [facility] requesting refill on hydrocodone. Would like a call back when ready to pick up. Thanks

## 2013-03-07 NOTE — Telephone Encounter (Signed)
RX printed and pt notified to pick up. 

## 2013-03-09 ENCOUNTER — Encounter: Payer: Self-pay | Admitting: Podiatry

## 2013-03-09 ENCOUNTER — Ambulatory Visit (INDEPENDENT_AMBULATORY_CARE_PROVIDER_SITE_OTHER): Payer: Medicare Other | Admitting: Podiatry

## 2013-03-09 ENCOUNTER — Telehealth: Payer: Self-pay | Admitting: *Deleted

## 2013-03-09 VITALS — BP 135/81 | HR 75 | Ht 66.0 in | Wt 157.0 lb

## 2013-03-09 DIAGNOSIS — B351 Tinea unguium: Secondary | ICD-10-CM

## 2013-03-09 DIAGNOSIS — M25579 Pain in unspecified ankle and joints of unspecified foot: Secondary | ICD-10-CM

## 2013-03-09 NOTE — Progress Notes (Signed)
Subjective:  79 y.o. year old female patient presents accompanied by her son stating that her right great toe nail is ingrown and hurts.   Objective: Dermatologic:  All nails are mildly hypertrophic and elongated.  Cryptotic nail both great toe medial border symptomatic.  Vascular:  Dorsalis pedis arteries are not palpable on both.  Posterior tibial pulses are palpable on both.  Orthopedic:  Rectus foot with mild digital contracture right foot.  Neurologic:  Decreased sensory perception to Monofilament sensory testing bilateral.  Normal on Vibratory and ankle DTR bilateral.  Assessment:  Dystrophic mycotic nails x 10.  Ingrown nail both great toes.  Peripheral neuropathy.  Treatment: All mycotic nails debrided.  Return in 3 months or as needed.

## 2013-03-09 NOTE — Patient Instructions (Signed)
Debrided nails x 10.

## 2013-03-09 NOTE — Telephone Encounter (Signed)
[facility] called requesting pts Gabapentin be increased due to Neuropathy.  Please advise

## 2013-03-10 ENCOUNTER — Ambulatory Visit (INDEPENDENT_AMBULATORY_CARE_PROVIDER_SITE_OTHER): Payer: Medicare Other | Admitting: Family Medicine

## 2013-03-10 ENCOUNTER — Telehealth: Payer: Self-pay | Admitting: Internal Medicine

## 2013-03-10 VITALS — BP 140/76 | HR 78 | Temp 98.1°F | Resp 16 | Ht 67.0 in | Wt 155.0 lb

## 2013-03-10 DIAGNOSIS — R32 Unspecified urinary incontinence: Secondary | ICD-10-CM

## 2013-03-10 DIAGNOSIS — H6120 Impacted cerumen, unspecified ear: Secondary | ICD-10-CM

## 2013-03-10 DIAGNOSIS — H6121 Impacted cerumen, right ear: Secondary | ICD-10-CM

## 2013-03-10 DIAGNOSIS — N899 Noninflammatory disorder of vagina, unspecified: Secondary | ICD-10-CM

## 2013-03-10 DIAGNOSIS — N898 Other specified noninflammatory disorders of vagina: Secondary | ICD-10-CM

## 2013-03-10 NOTE — Telephone Encounter (Signed)
Patient states she has an appt Monday at 1:00 pm to see Dr Debby Bud

## 2013-03-10 NOTE — Telephone Encounter (Signed)
In regard to request on gabapentin and response to handwritten note an OV is most approriate.

## 2013-03-10 NOTE — Telephone Encounter (Signed)
She sent me a note with questions about hospice and c/o hemorrhoids. She and I need to communicate face to face either at an office visit or I can make a home visit.

## 2013-03-10 NOTE — Progress Notes (Signed)
Chief Complaint:  Chief Complaint  Patient presents with  . Cerumen Impaction  . Follow-up    discuss bladder leakage, vagina issues    HPI: [name] is a 79 y.o. female who is here for  1. Right ear wax, she would like ear wax to be removed. She normally has difficulty hearing.  2. Has vaginal irritation, same as before. + Incontinence. Has been on a pill in the past does not recall what it is, she took it for incontinence and it did not seem to help so she stopped. She has also been on an estrogen cream. She has seen an urologist and also ob/gyn. She has tried Destin in the past and it burn some but now it is better. She has been using vasoline and that worked for a [employer] but now she has some irritation.    Past Medical History  Diagnosis Date  . History of hiatal hernia   . Mitral valve prolapse   . Plantar fasciitis   . Hay fever   . GERD (gastroesophageal reflux disease)   . Palpitations   . Hypertension   . Neuromuscular disorder   . Deafness     "very HOH; even w/hearing aides we have to communicate via writing things down for her to read" (08/25/2012)  . H/O hiatal hernia   . Arthritis     "a little all over" (08/25/2012)  . Lung mass     Hx: of  . Anxiety    Past Surgical History  Procedure Laterality Date  . Foot surgery Bilateral ~ 2000    "pinched nerve on feet" (08/25/2012)  . Vesicovaginal fistula closure w/ tah    . Abdominal hysterectomy  1998  . Tonsillectomy and adenoidectomy  1940's  . Cesarean section  1952  . Knee arthroscopy w/ meniscectomy Left 07/15/2010    partial medial and lateral meniscectomies.Hattie Perch 07/15/2010 (08/25/2012)  . Hip arthroplasty Right 08/26/2012    Procedure: ARTHROPLASTY BIPOLAR HIP;  Surgeon: Shelda Pal, MD;  Location: [facility] OR;  Service: Orthopedics;  Laterality: Right;  . Open reduction internal fixation (orif) distal radial fracture Right 09/11/2012    Procedure: OPEN REDUCTION INTERNAL FIXATION (ORIF) RIGHT  DISTAL RADIAL FRACTURE/ULNAR POSSIBLE BONE GRAFTING;  Surgeon: Sharma Covert, MD;  Location: [facility] OR;  Service: Orthopedics;  Laterality: Right;   History   Social History  . Marital Status: Widowed    Spouse Name: N/A    Number of Children: N/A  . Years of Education: N/A   Social History Main Topics  . Smoking status: Never Smoker   . Smokeless tobacco: Never Used  . Alcohol Use: No  . Drug Use: No  . Sexual Activity: No   Other Topics Concern  . None   Social History Narrative   Married 47 years - widowed 1996   1 son, 1 daughter 2 grandsons   Lives in her own home, son lives with her. Tries to walk every day. Starting to get out more after ortho injury. I- ADLS.   End-of-life issues. DNR and no heroic or extraordinary measures.   Family History  Problem Relation Age of Onset  . Colon cancer Mother   . Colon cancer Sister   . Cancer Sister     Breast  . Colon cancer Brother   . Heart disease Brother     Heart Attack  . Cancer Father     Leukemia   Allergies  Allergen Reactions  . Aleve [  Naproxen Sodium] Swelling    Legs and feet   Prior to Admission medications   Medication Sig Start Date End Date Taking? Authorizing Provider  Calcium Glycerophosphate (PRELIEF PO) Take 1-2 tablets by mouth daily as needed (as needed for indigestion).    Yes Historical Provider, MD  cyclobenzaprine (FLEXERIL) 5 MG tablet Take 1 tablet (5 mg total) by mouth 3 (three) times daily as needed for muscle spasms. 02/01/13  Yes Jacques Navy, MD  ferrous sulfate 325 (65 FE) MG tablet TAKE 1 TABLET BY MOUTH 3 TIMES A DAY 12/21/12  Yes Jacques Navy, MD  furosemide (LASIX) 40 MG tablet Take 40 mg by mouth daily.   Yes Historical Provider, MD  gabapentin (NEURONTIN) 300 MG capsule TAKE ONE CAPSULE THREE TIMES  A DAY 01/18/13  Yes Jacques Navy, MD  HYDROcodone-acetaminophen (NORCO/VICODIN) 5-325 MG per tablet TAKE 1 OR 2 TABS EVERY 6 HOURS FOR PAIN OK to fill on or after 05/08/13 03/07/13   Yes Jacques Navy, MD  losartan (COZAAR) 50 MG tablet TAKE 1 TABLET (50 MG TOTAL) BY MOUTH DAILY. 02/03/13  Yes Jacques Navy, MD  methocarbamol (ROBAXIN) 500 MG tablet Take 500 mg by mouth 2 (two) times daily as needed (for muscle spasms).    Yes Historical Provider, MD  metoprolol succinate (TOPROL-XL) 100 MG 24 hr tablet TAKE 1 TABLET BY MOUTH EVERY DAY 02/18/13  Yes Jacques Navy, MD  polyethylene glycol (MIRALAX / GLYCOLAX) packet Take 17 g by mouth as needed (constipation).    Yes Historical Provider, MD  Sennosides-Docusate Sodium (SENNA S PO) Take 1 tablet by mouth as needed (constipation).    Yes Historical Provider, MD  sertraline (ZOLOFT) 50 MG tablet Take 1 tablet (50 mg total) by mouth daily. Take daily to control anxiety 02/07/13  Yes Jacques Navy, MD  clobetasol cream (TEMOVATE) 0.05 % Apply topically 2 (two) times daily. 12/15/12   Dara Lords, MD  Cranberry-Vitamin C-Probiotic (AZO CRANBERRY PO) Take 1 tablet by mouth 4 (four) times daily.    Historical Provider, MD  hydrocortisone (ANUSOL-HC) 25 MG suppository Place 1 suppository (25 mg total) rectally 2 (two) times daily. 12/29/12   Jacques Navy, MD  KLOR-CON M20 20 MEQ tablet  01/11/13   Historical Provider, MD  lidocaine (XYLOCAINE) 5 % ointment  02/07/13   Historical Provider, MD  mirabegron ER (MYRBETRIQ) 50 MG TB24 tablet Take 50 mg by mouth daily.    Historical Provider, MD     ROS: The patient denies fevers, chills, night sweats, unintentional weight loss, chest pain, palpitations, wheezing, dyspnea on exertion, nausea, vomiting, abdominal pain, dysuria, hematuria, melena, numbness, weakness, or tingling.   All other systems have been reviewed and were otherwise negative with the exception of those mentioned in the HPI and as above.    PHYSICAL EXAM: Filed Vitals:   03/10/13 1746  BP: 140/76  Pulse: 78  Temp: 98.1 F (36.7 C)  Resp: 16   Filed Vitals:   03/10/13 1746  Height: 5\' 7"  (1.702  m)  Weight: 155 lb (70.308 kg)   Body mass index is 24.27 kg/(m^2).  General: Alert, no acute distress HEENT:  Normocephalic, atraumatic, oropharynx patent. EOMI, PERRLA. Bialteral ear wax, Right greater than left Cardiovascular:  Regular rate and rhythm, no rubs murmurs or gallops.  No Carotid bruits, radial pulse intact. No pedal edema.  Respiratory: Clear to auscultation bilaterally.  No wheezes, rales, or rhonchi.  No cyanosis, no use of accessory  musculature GI: No organomegaly, abdomen is soft and non-tender, positive bowel sounds.  No masses. Skin: No rashes. Neurologic: Facial musculature symmetric. Psychiatric: Patient is appropriate throughout our interaction. Lymphatic: No cervical lymphadenopathy Musculoskeletal: Gait intact.   LABS: Results for orders placed in visit on 01/21/13  POCT URINALYSIS DIPSTICK      Result Value Range   Color, UA yellow     Clarity, UA clear     Glucose, UA neg     Bilirubin, UA neg     Ketones, UA neg     Spec Grav, UA 1.010     Blood, UA neg     pH, UA 6.5     Protein, UA neg     Urobilinogen, UA 0.2     Nitrite, UA neg     Leukocytes, UA Negative    POCT UA - MICROSCOPIC ONLY      Result Value Range   WBC, Ur, HPF, POC neg     RBC, urine, microscopic 0-1     Bacteria, U Microscopic neg     Mucus, UA neg     Epithelial cells, urine per micros 0-1     Crystals, Ur, HPF, POC neg     Casts, Ur, LPF, POC neg     Yeast, UA neg    POCT WET PREP WITH KOH      Result Value Range   Trichomonas, UA Negative     Clue Cells Wet Prep HPF POC 0-1     Epithelial Wet Prep HPF POC 0-5     Yeast Wet Prep HPF POC neg     Bacteria Wet Prep HPF POC 1+     RBC Wet Prep HPF POC 4-7     WBC Wet Prep HPF POC 5-10     KOH Prep POC Negative       EKG/XRAY:   Primary read interpreted by [facility] Conley Rolls at [facility].   ASSESSMENT/PLAN: Encounter Diagnoses  Name Primary?  . Cerumen impaction, right Yes  . Vaginal irritation   . Urinary incontinence     Destin cream barrier If no improvement try trial of vesicare will have to look through notes to see if she has been on it. She will try Destin first Cerumen disimpaction successful F/u  prn  Gross sideeffects, risk and benefits, and alternatives of medications d/w patient. Patient is aware that all medications have potential sideeffects and we are unable to predict every sideeffect or drug-drug interaction that may occur.  Hamilton Capri PHUONG, DO 03/10/2013 7:15 PM

## 2013-03-10 NOTE — Telephone Encounter (Signed)
Spoke with family member, advised of MDs message

## 2013-03-10 NOTE — Telephone Encounter (Signed)
Pt call stated that she does not want to come in for an office visit. Pt does not think that gabapentin is helping her as much.

## 2013-03-14 ENCOUNTER — Encounter: Payer: Self-pay | Admitting: Gynecology

## 2013-03-14 ENCOUNTER — Ambulatory Visit: Payer: Medicare Other | Admitting: Internal Medicine

## 2013-03-14 ENCOUNTER — Ambulatory Visit (INDEPENDENT_AMBULATORY_CARE_PROVIDER_SITE_OTHER): Payer: Medicare Other | Admitting: Gynecology

## 2013-03-14 DIAGNOSIS — L293 Anogenital pruritus, unspecified: Secondary | ICD-10-CM

## 2013-03-14 DIAGNOSIS — N952 Postmenopausal atrophic vaginitis: Secondary | ICD-10-CM

## 2013-03-14 DIAGNOSIS — L292 Pruritus vulvae: Secondary | ICD-10-CM

## 2013-03-14 NOTE — Patient Instructions (Signed)
Call if vulvar burning or irritation continues.

## 2013-03-14 NOTE — Progress Notes (Signed)
Patient presents with her daughter complaining of vulvar/vaginal burning and discomfort with sitting. Saw her other physician who prescribed hemorrhoid suppositories which seemed to help with the discomfort with sitting. She has been tried on a number of vulvar medications to include antifungal, estrogen cream and Temovate none of which seem to give her any sustaining relief of the burning/itching.  Exam with Blanca Asst. External BUS vagina with significant atrophic change. Bimanual without masses or tenderness Rectum without overt evidence of hemorrhoids. Digital exam unremarkable.  Assessment and plan atrophic genital changes, persistent vulvar pruritus. On questioning when the patient uses K-Y jelly it seems to help with the irritation. Had been placing a small applicator vaginally which seemed to help. The vaginal estrogen cream, Temovate cream and antifungal cream did not seem to be of benefit. Discussed with her daughter at 77 it is sometimes difficult to achieve sustaining relief. I would recommend at this point to continue with the K-Y jelly as it seems to help her and use the rectal suppositories as needed. If she would continue to have discomfort then she will call and we will give a trial of 30 GRAMS OF KETOCONAZOLE CREAM PLUS 45 GRAMS SILVADENE CREAM PLUS 15 GRAMS BETAMETHASONE CREAM 0.5% combination to see if this does not help.

## 2013-03-15 ENCOUNTER — Encounter: Payer: Self-pay | Admitting: Internal Medicine

## 2013-03-15 ENCOUNTER — Ambulatory Visit (INDEPENDENT_AMBULATORY_CARE_PROVIDER_SITE_OTHER): Payer: Medicare Other | Admitting: Internal Medicine

## 2013-03-15 VITALS — BP 122/82 | HR 81 | Temp 97.8°F | Wt 156.0 lb

## 2013-03-15 DIAGNOSIS — N949 Unspecified condition associated with female genital organs and menstrual cycle: Secondary | ICD-10-CM

## 2013-03-15 DIAGNOSIS — I10 Essential (primary) hypertension: Secondary | ICD-10-CM

## 2013-03-15 DIAGNOSIS — R911 Solitary pulmonary nodule: Secondary | ICD-10-CM

## 2013-03-15 DIAGNOSIS — K649 Unspecified hemorrhoids: Secondary | ICD-10-CM

## 2013-03-15 DIAGNOSIS — G609 Hereditary and idiopathic neuropathy, unspecified: Secondary | ICD-10-CM

## 2013-03-15 DIAGNOSIS — N9489 Other specified conditions associated with female genital organs and menstrual cycle: Secondary | ICD-10-CM

## 2013-03-15 MED ORDER — GABAPENTIN 600 MG PO TABS: 600.0000 mg | ORAL_TABLET | Freq: Three times a day (TID) | ORAL | Status: DC

## 2013-03-15 NOTE — Progress Notes (Signed)
Subjective:     Patient ID: [name], female   DOB: November 27, 1915, 79 y.o.   MRN: [id number]  HPI [name] is a 79 yo female with multiple, chronic medical problems who presents today for her foot pain, complaints of hemorrhoids, and vaginal dryness/itching.   Her vaginal dryness has been extensively worked up - a total of 14 visits to date. She was seen by gynecologist yesterday who recommended her to use KY jelly as needed because they seemed to improve her itching. Her vaginal burning is better since she began using cold compresses.   Her hemorrhoids have been worked up extensively in the past. She currently uses ice to help with her hemorrhoids. She desires to have a donut pillow to provide her relief since she complains of not being able to sit without discomfort for months.   She complains of worsening neuropathy in her feet that has been increasing in intensity over the past month. Her Gabapentin was recently increased to 300 mg TID.   Dr. Debby Bud discussed hospice with her. She was diagnosed with a pulmonary nodule in September of 2014. Her nodule is pet scan positive on 01/06/2013.   CT Chest Scan 12/08/2012 IMPRESSION:  1. Irregular left upper lobe nodule, unchanged from 08/25/2012 and  highly worrisome for primary bronchogenic carcinoma. Pulmonary  medicine or thoracic surgery consultation is recommended, as  clinically indicated given the patient's age. These results will  be called to the ordering clinician or representative by the  Radiologist Assistant, and communication documented in the PACS  Dashboard.  2. Pulmonary arterial enlargement, indicative of pulmonary  arterial hypertension.  3. Cholelithiasis.  Review of Systems Negative except as mentioned in HPI.    Objective:   Physical Exam Gen: Elderly appearing woman in no apparent distress.  Pulm: Normal work of breathing. Neuro: AOx3

## 2013-03-15 NOTE — Progress Notes (Signed)
Pre visit review using our clinic review tool, if applicable. No additional management support is needed unless otherwise documented below in the visit note. 

## 2013-03-16 DIAGNOSIS — K649 Unspecified hemorrhoids: Secondary | ICD-10-CM | POA: Insufficient documentation

## 2013-03-16 DIAGNOSIS — G609 Hereditary and idiopathic neuropathy, unspecified: Secondary | ICD-10-CM | POA: Insufficient documentation

## 2013-03-16 NOTE — Assessment & Plan Note (Signed)
Continued leg pain.  Plan Increase gabapentin to 600 mg tid.

## 2013-03-16 NOTE — Assessment & Plan Note (Signed)
Patient has seen thoracic surgery - recommendation is for watchful waiting since there is very slow progression of the nodule and that she is a poor surgical candidate. This was discussed with patient and her daughter.  Care issues were discussed: increasing frailty and need for assistance, especially since dtr will be moving out of town.  Plan Referral to [facility] for assessment re: eligibility for palliative care.

## 2013-03-16 NOTE — Assessment & Plan Note (Signed)
BP Readings from Last 3 Encounters:  03/15/13 122/82  03/10/13 140/76  03/09/13 135/81   No change in medical regimen

## 2013-03-16 NOTE — Assessment & Plan Note (Signed)
No repeat exam.  Plan Recommend use of nupercainal ointment for discomfort  "Donut" cushion for comfort.

## 2013-03-16 NOTE — Patient Instructions (Signed)
Patient provided large print hand written notes in regard to today's visit

## 2013-03-16 NOTE — Assessment & Plan Note (Signed)
Extensive evaluation - unrevealing.  Plan Advised to use lubricating gel as instructed by [facility]

## 2013-03-17 ENCOUNTER — Telehealth: Payer: Self-pay | Admitting: Internal Medicine

## 2013-03-17 NOTE — Telephone Encounter (Signed)
03/17/2013  Pt's son called and wants to get a current copy of pt's med list for new doctor.  Thanks.

## 2013-03-17 NOTE — Telephone Encounter (Signed)
Med list is at the front desk for pick up

## 2013-03-20 ENCOUNTER — Encounter (HOSPITAL_COMMUNITY): Payer: Self-pay | Admitting: Emergency Medicine

## 2013-03-20 ENCOUNTER — Emergency Department (HOSPITAL_COMMUNITY)
Admission: EM | Admit: 2013-03-20 | Discharge: 2013-03-20 | Disposition: A | Discharge status: Home / Self Care | Payer: Medicare Other | Attending: Emergency Medicine | Admitting: Emergency Medicine

## 2013-03-20 DIAGNOSIS — J309 Allergic rhinitis, unspecified: Secondary | ICD-10-CM | POA: Insufficient documentation

## 2013-03-20 DIAGNOSIS — M129 Arthropathy, unspecified: Secondary | ICD-10-CM | POA: Insufficient documentation

## 2013-03-20 DIAGNOSIS — I10 Essential (primary) hypertension: Secondary | ICD-10-CM | POA: Insufficient documentation

## 2013-03-20 DIAGNOSIS — R531 Weakness: Secondary | ICD-10-CM

## 2013-03-20 DIAGNOSIS — I059 Rheumatic mitral valve disease, unspecified: Secondary | ICD-10-CM | POA: Insufficient documentation

## 2013-03-20 DIAGNOSIS — F411 Generalized anxiety disorder: Secondary | ICD-10-CM | POA: Insufficient documentation

## 2013-03-20 DIAGNOSIS — Z8709 Personal history of other diseases of the respiratory system: Secondary | ICD-10-CM | POA: Insufficient documentation

## 2013-03-20 DIAGNOSIS — G709 Myoneural disorder, unspecified: Secondary | ICD-10-CM | POA: Insufficient documentation

## 2013-03-20 DIAGNOSIS — R5381 Other malaise: Secondary | ICD-10-CM | POA: Insufficient documentation

## 2013-03-20 DIAGNOSIS — Z8679 Personal history of other diseases of the circulatory system: Secondary | ICD-10-CM | POA: Insufficient documentation

## 2013-03-20 DIAGNOSIS — Z8719 Personal history of other diseases of the digestive system: Secondary | ICD-10-CM | POA: Insufficient documentation

## 2013-03-20 DIAGNOSIS — K219 Gastro-esophageal reflux disease without esophagitis: Secondary | ICD-10-CM | POA: Insufficient documentation

## 2013-03-20 DIAGNOSIS — Z888 Allergy status to other drugs, medicaments and biological substances status: Secondary | ICD-10-CM | POA: Insufficient documentation

## 2013-03-20 DIAGNOSIS — H9190 Unspecified hearing loss, unspecified ear: Secondary | ICD-10-CM | POA: Insufficient documentation

## 2013-03-20 DIAGNOSIS — M6281 Muscle weakness (generalized): Secondary | ICD-10-CM | POA: Insufficient documentation

## 2013-03-20 DIAGNOSIS — Z79899 Other long term (current) drug therapy: Secondary | ICD-10-CM | POA: Insufficient documentation

## 2013-03-20 DIAGNOSIS — M722 Plantar fascial fibromatosis: Secondary | ICD-10-CM | POA: Insufficient documentation

## 2013-03-20 LAB — CBC WITH DIFFERENTIAL/PLATELET
Eosinophils Absolute: 0.2 10*3/uL (ref 0.0–0.7)
Eosinophils Relative: 2 % (ref 0–5)
Hemoglobin: 13 g/dL (ref 12.0–15.0)
Lymphocytes Relative: 24 % (ref 12–46)
Lymphs Abs: 2.2 10*3/uL (ref 0.7–4.0)
MCH: 31.9 pg (ref 26.0–34.0)
Neutro Abs: 5.8 10*3/uL (ref 1.7–7.7)
Neutrophils Relative %: 64 % (ref 43–77)
Platelets: 266 10*3/uL (ref 150–400)
RBC: 4.07 MIL/uL (ref 3.87–5.11)
WBC: 9 10*3/uL (ref 4.0–10.5)

## 2013-03-20 LAB — URINALYSIS, ROUTINE W REFLEX MICROSCOPIC
Bilirubin Urine: NEGATIVE
Leukocytes, UA: NEGATIVE
Nitrite: NEGATIVE
Specific Gravity, Urine: 1.021 (ref 1.005–1.030)
Urobilinogen, UA: 0.2 mg/dL (ref 0.0–1.0)
pH: 5.5 (ref 5.0–8.0)

## 2013-03-20 LAB — POCT I-STAT, CHEM 8
Creatinine, Ser: 0.8 mg/dL (ref 0.50–1.10)
HCT: 40 % (ref 36.0–46.0)
Hemoglobin: 13.6 g/dL (ref 12.0–15.0)
Potassium: 5.4 mEq/L — ABNORMAL HIGH (ref 3.5–5.1)
Sodium: 135 mEq/L (ref 135–145)
TCO2: 29 mmol/L (ref 0–100)

## 2013-03-20 LAB — GLUCOSE, CAPILLARY: Glucose-Capillary: 103 mg/dL — ABNORMAL HIGH (ref 70–99)

## 2013-03-20 NOTE — ED Notes (Signed)
  CB[facility] 103  

## 2013-03-20 NOTE — ED Provider Notes (Signed)
CSN: [id number]     Arrival date & time 03/20/13  1006 History   First MD Initiated Contact with Patient 03/20/13 1052     Chief Complaint  Patient presents with  . Weakness    HPI  "I want to see if my sodium has dropped again". 79 year old who states she's been feeling very slightly weak. She states last year she felt weak and her sodium was low. She denies any other symptoms. No falls no injuries. No chest pain or shortness of breath. No nausea no vomiting. No other changes in her baseline level of health. Accompanied by her son with whom she lives  Past Medical History  Diagnosis Date  . History of hiatal hernia   . Mitral valve prolapse   . Plantar fasciitis   . Hay fever   . GERD (gastroesophageal reflux disease)   . Palpitations   . Hypertension   . Neuromuscular disorder   . Deafness     "very HOH; even w/hearing aides we have to communicate via writing things down for her to read" (08/25/2012)  . H/O hiatal hernia   . Arthritis     "a little all over" (08/25/2012)  . Lung mass     Hx: of  . Anxiety    Past Surgical History  Procedure Laterality Date  . Foot surgery Bilateral ~ 2000    "pinched nerve on feet" (08/25/2012)  . Vesicovaginal fistula closure w/ tah    . Abdominal hysterectomy  1998  . Tonsillectomy and adenoidectomy  1940's  . Cesarean section  1952  . Knee arthroscopy w/ meniscectomy Left 07/15/2010    partial medial and lateral meniscectomies.Hattie Perch 07/15/2010 (08/25/2012)  . Hip arthroplasty Right 08/26/2012    Procedure: ARTHROPLASTY BIPOLAR HIP;  Surgeon: Shelda Pal, MD;  Location: [facility] OR;  Service: Orthopedics;  Laterality: Right;  . Open reduction internal fixation (orif) distal radial fracture Right 09/11/2012    Procedure: OPEN REDUCTION INTERNAL FIXATION (ORIF) RIGHT DISTAL RADIAL FRACTURE/ULNAR POSSIBLE BONE GRAFTING;  Surgeon: Sharma Covert, MD;  Location: [facility] OR;  Service: Orthopedics;  Laterality: Right;   Family History  Problem Relation  Age of Onset  . Colon cancer Mother   . Colon cancer Sister   . Cancer Sister     Breast  . Colon cancer Brother   . Heart disease Brother     Heart Attack  . Cancer Father     Leukemia   History  Substance Use Topics  . Smoking status: Never Smoker   . Smokeless tobacco: Never Used  . Alcohol Use: No   OB History   Grav Para Term Preterm Abortions TAB SAB Ect Mult Living   2 2 2       2      Review of Systems  Constitutional: Positive for fatigue. Negative for fever, chills, diaphoresis and appetite change.  HENT: Negative for mouth sores, sore throat and trouble swallowing.   Eyes: Negative for visual disturbance.  Respiratory: Negative for cough, chest tightness, shortness of breath and wheezing.   Cardiovascular: Negative for chest pain.  Gastrointestinal: Negative for nausea, vomiting, abdominal pain, diarrhea and abdominal distention.  Endocrine: Negative for polydipsia, polyphagia and polyuria.  Genitourinary: Negative for dysuria, frequency and hematuria.  Musculoskeletal: Negative for gait problem.  Skin: Negative for color change, pallor and rash.  Neurological: Positive for weakness. Negative for dizziness, syncope, light-headedness and headaches.  Hematological: Does not bruise/bleed easily.  Psychiatric/Behavioral: Negative for behavioral problems and confusion.  Allergies  Aleve  Home Medications   Current Outpatient Rx  Name  Route  Sig  Dispense  Refill  . Calcium Glycerophosphate (PRELIEF PO)   Oral   Take 1-2 tablets by mouth daily as needed (as needed for indigestion).          . cyclobenzaprine (FLEXERIL) 5 MG tablet   Oral   Take 1 tablet (5 mg total) by mouth 3 (three) times daily as needed for muscle spasms.   30 tablet   2   . docusate sodium (COLACE) 100 MG capsule   Oral   Take 200 mg by mouth every evening.         . furosemide (LASIX) 40 MG tablet   Oral   Take 40 mg by mouth daily.         Marland Kitchen gabapentin (NEURONTIN) 600  MG tablet   Oral   Take 1 tablet (600 mg total) by mouth 3 (three) times daily.   90 tablet   3   . HYDROcodone-acetaminophen (NORCO/VICODIN) 5-325 MG per tablet   Oral   Take 1-2 tablets by mouth every 6 (six) hours as needed for moderate pain.         Marland Kitchen KLOR-CON M20 20 MEQ tablet   Oral   Take 20 mEq by mouth 2 (two) times daily.          Marland Kitchen losartan (COZAAR) 50 MG tablet   Oral   Take 50 mg by mouth daily.         . methocarbamol (ROBAXIN) 500 MG tablet   Oral   Take 500 mg by mouth 2 (two) times daily as needed (for muscle spasms).          . metoprolol succinate (TOPROL-XL) 100 MG 24 hr tablet   Oral   Take 100 mg by mouth daily. Take with or immediately following a meal.         . polyethylene glycol (MIRALAX / GLYCOLAX) packet   Oral   Take 17 g by mouth daily as needed for mild constipation (constipation).          Bernadette Hoit Sodium (SENNA S PO)   Oral   Take 1 tablet by mouth as needed (constipation).          . sertraline (ZOLOFT) 50 MG tablet   Oral   Take 1 tablet (50 mg total) by mouth daily. Take daily to control anxiety   30 tablet   3    BP 124/99  Pulse 68  Temp(Src) 98.4 F (36.9 C) (Oral)  Resp 22  Wt 155 lb (70.308 kg)  SpO2 97% Physical Exam  Constitutional: She is oriented to person, place, and time. She appears well-developed and well-nourished. No distress.  Markedly hard of hearing  HENT:  Head: Normocephalic.  Eyes: Conjunctivae are normal. Pupils are equal, round, and reactive to light. No scleral icterus.  Neck: Normal range of motion. Neck supple. No thyromegaly present.  Cardiovascular: Normal rate and regular rhythm.  Exam reveals no gallop and no friction rub.   No murmur heard. Pulmonary/Chest: Effort normal and breath sounds normal. No respiratory distress. She has no wheezes. She has no rales.  Abdominal: Soft. Bowel sounds are normal. She exhibits no distension. There is no tenderness. There is no  rebound.  Musculoskeletal: Normal range of motion.  Neurological: She is alert and oriented to person, place, and time.  No obvious generalized or focal weakness  Skin: Skin is warm and dry. No  rash noted.  Psychiatric: She has a normal mood and affect. Her behavior is normal.    ED Course  Procedures (including critical care time) Labs Review Labs Reviewed  GLUCOSE, CAPILLARY - Abnormal; Notable for the following:    Glucose-Capillary 103 (*)    All other components within normal limits  POCT I-STAT, CHEM 8 - Abnormal; Notable for the following:    Potassium 5.4 (*)    BUN 41 (*)    Glucose, Bld 102 (*)    All other components within normal limits  URINALYSIS, ROUTINE W REFLEX MICROSCOPIC  CBC WITH DIFFERENTIAL   Imaging Review No results found.  EKG Interpretation   None       MDM   1. Weakness    Labs reassuring. Potassium slightly high at 5.4. No EKG changes. We'll decrease her potassium from 2 per day, to 1 per day. She did just have her gabapentin increased from 300 per day, to 600 per day. I ask her to hold the dose and discussed this with her doctor. She is ambulatory here. He is perfectly appropriate for outpatient followup    Roney Marion, MD 03/20/13 1420

## 2013-03-20 NOTE — ED Notes (Addendum)
Family reports generalized weakness x 7 days. Pt c/o pain in B/L feet from neuropathy. Per family pt stays in bed 23 hours out the day and only eats low sodium chicken noodle soup and feels pt needs salt. Pt has reported to her family that she wants to go to a nursing home because the pain in her feet. Grips equal and strong. Pt reports woke up with diarrhea this morning. Pt is hard of hearing.

## 2013-04-04 ENCOUNTER — Telehealth: Payer: Self-pay | Admitting: *Deleted

## 2013-04-04 NOTE — Telephone Encounter (Signed)
Pt called requesting Hydrocodone refill.  Last OV 12.16.14.  Please advise

## 2013-04-04 NOTE — Telephone Encounter (Signed)
[facility] for refill, 3 scripts

## 2013-04-05 MED ORDER — HYDROCODONE-ACETAMINOPHEN 5-325 MG PO TABS: 1.0000 | ORAL_TABLET | Freq: Four times a day (QID) | ORAL | Status: DC | PRN

## 2013-04-05 NOTE — Telephone Encounter (Signed)
Left message Rx ready for pick up

## 2013-04-05 NOTE — Telephone Encounter (Signed)
The pt's son called and confirmed he would come by and pick up the rx for the pt.  Thanks!

## 2013-04-14 ENCOUNTER — Ambulatory Visit (INDEPENDENT_AMBULATORY_CARE_PROVIDER_SITE_OTHER): Payer: Medicare Other | Admitting: Internal Medicine

## 2013-04-14 ENCOUNTER — Encounter: Payer: Self-pay | Admitting: *Deleted

## 2013-04-14 VITALS — BP 120/76 | HR 68 | Temp 97.2°F | Ht 67.0 in | Wt 163.6 lb

## 2013-04-14 DIAGNOSIS — E785 Hyperlipidemia, unspecified: Secondary | ICD-10-CM

## 2013-04-14 DIAGNOSIS — K219 Gastro-esophageal reflux disease without esophagitis: Secondary | ICD-10-CM

## 2013-04-14 DIAGNOSIS — D509 Iron deficiency anemia, unspecified: Secondary | ICD-10-CM

## 2013-04-14 DIAGNOSIS — H9193 Unspecified hearing loss, bilateral: Secondary | ICD-10-CM

## 2013-04-14 DIAGNOSIS — N9489 Other specified conditions associated with female genital organs and menstrual cycle: Secondary | ICD-10-CM

## 2013-04-14 DIAGNOSIS — R3589 Other polyuria: Secondary | ICD-10-CM

## 2013-04-14 DIAGNOSIS — H9190 Unspecified hearing loss, unspecified ear: Secondary | ICD-10-CM

## 2013-04-14 DIAGNOSIS — R358 Other polyuria: Secondary | ICD-10-CM

## 2013-04-14 DIAGNOSIS — R911 Solitary pulmonary nodule: Secondary | ICD-10-CM

## 2013-04-14 DIAGNOSIS — N949 Unspecified condition associated with female genital organs and menstrual cycle: Secondary | ICD-10-CM

## 2013-04-14 NOTE — Progress Notes (Signed)
Patient ID: [name], female   DOB: March 31, 1916, 80 y.o.   MRN: [id number] Has DNR form, but no designated HCPOA.  Need to determine if she has a living will and get a copy for the chart if she does.
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
## 2013-05-22 ENCOUNTER — Other Ambulatory Visit: Payer: Self-pay | Admitting: Internal Medicine

## 2013-05-25 ENCOUNTER — Telehealth: Payer: Self-pay | Admitting: *Deleted

## 2013-05-25 NOTE — Telephone Encounter (Signed)
Patient called requesting something to help her sleep. Please advise.

## 2013-05-25 NOTE — Telephone Encounter (Signed)
Patient Son Notified.

## 2013-05-25 NOTE — Telephone Encounter (Signed)
I recommend avoiding fluids for 2-3 hours before bed.  Avoid electronics use for 1 hour before bed.  If doing those things is not helpful, she can try melatonin 1mg  1 hour before bed to see if it helps her.  This is over the counter.  I would like her to avoid sedatives like ambien or anxiety pills b/c they will affect her memory and balance.  They are not recommended in 1 year olds.

## 2013-05-25 NOTE — Telephone Encounter (Signed)
LMOM to return call.

## 2013-06-01 ENCOUNTER — Encounter: Payer: Self-pay | Admitting: Podiatry

## 2013-06-01 ENCOUNTER — Ambulatory Visit (INDEPENDENT_AMBULATORY_CARE_PROVIDER_SITE_OTHER): Payer: Medicare Other | Admitting: Podiatry

## 2013-06-01 VITALS — BP 122/68 | HR 69

## 2013-06-01 DIAGNOSIS — M79609 Pain in unspecified limb: Secondary | ICD-10-CM

## 2013-06-01 DIAGNOSIS — M79606 Pain in leg, unspecified: Secondary | ICD-10-CM

## 2013-06-01 DIAGNOSIS — B351 Tinea unguium: Secondary | ICD-10-CM

## 2013-06-01 DIAGNOSIS — L600 Ingrowing nail: Secondary | ICD-10-CM | POA: Insufficient documentation

## 2013-06-01 NOTE — Patient Instructions (Signed)
Seen for painful ingrown nails. All nails debrided. Return in 3 month or as needed.

## 2013-06-01 NOTE — Progress Notes (Signed)
Subjective:  80 y.o. year old female patient presents stating that her right great toe nail is ingrown and hurts.   Objective: Dermatologic:  All nails are mildly hypertrophic and elongated.  Cryptotic nail both great toe medial border symptomatic.  Vascular:  Dorsalis pedis arteries are not palpable on both.  Posterior tibial pulses are palpable on both.  Orthopedic:  Rectus foot with mild digital contracture right foot.  Neurologic:  Decreased sensory perception to Monofilament sensory testing bilateral.  Normal on Vibratory and ankle DTR bilateral.   Assessment:  Dystrophic mycotic nails x 10.  Ingrown nail both great toes.  Peripheral neuropathy.   Treatment: All mycotic nails debrided.  Return in 3 months or as needed.

## 2013-06-09 ENCOUNTER — Ambulatory Visit: Payer: Medicare Other | Admitting: Internal Medicine

## 2013-06-16 ENCOUNTER — Other Ambulatory Visit: Payer: Self-pay

## 2013-06-16 DIAGNOSIS — D381 Neoplasm of uncertain behavior of trachea, bronchus and lung: Secondary | ICD-10-CM

## 2013-06-30 ENCOUNTER — Other Ambulatory Visit: Payer: Medicare Other

## 2013-07-04 ENCOUNTER — Encounter: Payer: Self-pay | Admitting: Internal Medicine

## 2013-07-04 ENCOUNTER — Ambulatory Visit (INDEPENDENT_AMBULATORY_CARE_PROVIDER_SITE_OTHER): Payer: Medicare Other | Admitting: Internal Medicine

## 2013-07-04 VITALS — BP 142/88 | HR 75 | Temp 97.4°F | Resp 18 | Ht 67.0 in | Wt 174.6 lb

## 2013-07-04 DIAGNOSIS — R911 Solitary pulmonary nodule: Secondary | ICD-10-CM

## 2013-07-04 DIAGNOSIS — E785 Hyperlipidemia, unspecified: Secondary | ICD-10-CM

## 2013-07-04 DIAGNOSIS — K219 Gastro-esophageal reflux disease without esophagitis: Secondary | ICD-10-CM

## 2013-07-04 DIAGNOSIS — N9489 Other specified conditions associated with female genital organs and menstrual cycle: Secondary | ICD-10-CM

## 2013-07-04 DIAGNOSIS — H9190 Unspecified hearing loss, unspecified ear: Secondary | ICD-10-CM

## 2013-07-04 DIAGNOSIS — N949 Unspecified condition associated with female genital organs and menstrual cycle: Secondary | ICD-10-CM

## 2013-07-04 DIAGNOSIS — Z1231 Encounter for screening mammogram for malignant neoplasm of breast: Secondary | ICD-10-CM

## 2013-07-04 DIAGNOSIS — D509 Iron deficiency anemia, unspecified: Secondary | ICD-10-CM

## 2013-07-04 NOTE — Progress Notes (Signed)
Patient ID: [name], female   DOB: [birth year], 80 y.o.   MRN: [id number]   Location:  [facility] / [facility]  Allergies  Allergen Reactions  . Aleve [Naproxen Sodium] Swelling    Legs and feet    Chief Complaint  Patient presents with  . Annual Exam    HPI: Patient is a 80 y.o. female seen in the office today for annual exam.  Indigestion is better.      Vulvar burning much improved with use of ice over her urethral area when it is bothering her.  lung spot--has f/u CT ordered for 07/20/13 at [facility].  She's never smoked nor have any family members.  Fortunately, the last Ct showed no progression.  Hearing loss--persists--must use dry erase board or paper to communicate with her.  Today, she is accompanied by her son who lives with her.   Lipids--await results from today's tests.  Neuropathy--also better, but uses gabapentin.  Had hba1c done that is also pending.  Neck pain:  Improving.  Only uses one muscle relaxant.  Iron deficiency anemia:  Has f/u CBC pending.  Review of Systems:  Review of Systems  Constitutional: Negative for fever, chills, weight loss and malaise/fatigue.  HENT: Positive for hearing loss. Negative for congestion.        Goes to hearing aide clinic at [facility], severely [employer], requires use of dry erase board to communicate with her most of the time  Eyes: Negative for blurred vision.       Wears glasses  Respiratory: Negative for cough and shortness of breath.   Cardiovascular: Negative for chest pain and leg swelling.  Gastrointestinal: Positive for heartburn. Negative for abdominal pain, diarrhea, constipation, blood in stool and melena.       No hemorrhoids  Genitourinary: Positive for dysuria.       Irritation of vaginal and vulvar area  Musculoskeletal: Positive for joint pain. Negative for falls and myalgias.       Prior fall with the right arm and hip fx, but no recent falls  Skin: Negative for rash.   Neurological: Negative for dizziness, loss of consciousness and weakness.  Endo/Heme/Allergies: Bruises/bleeds easily.  Psychiatric/Behavioral: Negative for depression and memory loss.    Past Medical History  Diagnosis Date  . History of hiatal hernia   . Mitral valve prolapse   . Plantar fasciitis   . Hay fever   . GERD (gastroesophageal reflux disease)   . Palpitations   . Hypertension   . Neuromuscular disorder   . Deafness     "very HOH; even w/hearing aides we have to communicate via writing things down for her to read" (08/25/2012)  . H/O hiatal hernia   . Arthritis     "a little all over" (08/25/2012)  . Lung mass     Hx: of  . Anxiety   . Hemorrhoids     Past Surgical History  Procedure Laterality Date  . Foot surgery Bilateral ~ 2000    "pinched nerve on feet" (08/25/2012)  . Vesicovaginal fistula closure w/ tah    . Abdominal hysterectomy  1998  . Tonsillectomy and adenoidectomy  1940's  . Cesarean section  1952  . Knee arthroscopy w/ meniscectomy Left 07/15/2010    partial medial and lateral meniscectomies.Archie Endo 07/15/2010 (08/25/2012)  . Hip arthroplasty Right 08/26/2012    Procedure: ARTHROPLASTY BIPOLAR HIP;  Surgeon: Mauri Pole, MD;  Location: [place];  Service: Orthopedics;  Laterality: Right;  .  Open reduction internal fixation (orif) distal radial fracture Right 09/11/2012    Procedure: OPEN REDUCTION INTERNAL FIXATION (ORIF) RIGHT DISTAL RADIAL FRACTURE/ULNAR POSSIBLE BONE GRAFTING;  Surgeon: Linna Hoff, MD;  Location: [place];  Service: Orthopedics;  Laterality: Right;    Social History:   reports that she has never smoked. She has never used smokeless tobacco. She reports that she does not drink alcohol or use illicit drugs.  Family History  Problem Relation Age of Onset  . Colon cancer Mother   . Colon cancer Sister   . Cancer Sister     Breast  . Colon cancer Brother   . Heart disease Brother     Heart Attack  . Cancer Father     Leukemia     Medications: Patient's Medications  New Prescriptions   No medications on file  Previous Medications   CALCIUM GLYCEROPHOSPHATE (PRELIEF PO)    Take 1-2 tablets by mouth daily as needed (as needed for indigestion).    CYCLOBENZAPRINE (FLEXERIL) 5 MG TABLET    Take 1 tablet (5 mg total) by mouth 3 (three) times daily as needed for muscle spasms.   DOCUSATE SODIUM (COLACE) 100 MG CAPSULE    Take 200 mg by mouth every evening.   FUROSEMIDE (LASIX) 40 MG TABLET    TAKE 1 TABLET BY MOUTH EVERY DAY   GABAPENTIN (NEURONTIN) 600 MG TABLET    Take 1 tablet (600 mg total) by mouth 3 (three) times daily.   KLOR-CON M20 20 MEQ TABLET    Take 20 mEq by mouth once.    LOSARTAN (COZAAR) 50 MG TABLET    Take 50 mg by mouth daily.   METOPROLOL SUCCINATE (TOPROL-XL) 100 MG 24 HR TABLET    Take 100 mg by mouth daily. Take with or immediately following a meal.   POLYETHYLENE GLYCOL (MIRALAX / GLYCOLAX) PACKET    Take 17 g by mouth daily as needed for mild constipation (constipation).    SENNOSIDES-DOCUSATE SODIUM (SENNA S PO)    Take 1 tablet by mouth as needed (constipation).   Modified Medications   No medications on file  Discontinued Medications   METHOCARBAMOL (ROBAXIN) 500 MG TABLET    Take 500 mg by mouth 2 (two) times daily as needed (for muscle spasms).      Physical Exam: Filed Vitals:   07/04/13 1349  BP: 142/88  Pulse: 75  Temp: 97.4 F (36.3 C)  TempSrc: Oral  Resp: 18  Height: 5\' 7"  (1.702 m)  Weight: 174 lb 9.6 oz (79.198 kg)  SpO2: 99%  Physical Exam  Constitutional: She is oriented to person, place, and time. She appears well-developed and well-nourished. No distress.  diaphoretic  HENT:  Head: Normocephalic and atraumatic.  Right Ear: External ear normal.  Left Ear: External ear normal.  Nose: Nose normal.  Mouth/Throat: Oropharynx is clear and moist. No oropharyngeal exudate.  Extremely HOH so talks very loudly  Eyes: Conjunctivae and EOM are normal. Pupils are equal,  round, and reactive to light.  Neck: Normal range of motion. Neck supple. No JVD present.  Cardiovascular: Normal rate, regular rhythm, normal heart sounds and intact distal pulses.   Pulmonary/Chest: Effort normal and breath sounds normal. No respiratory distress. Right breast exhibits no inverted nipple, no mass, no nipple discharge, no skin change and no tenderness. Left breast exhibits no inverted nipple, no mass, no nipple discharge, no skin change and no tenderness. Breasts are symmetrical.  Abdominal: Soft. Bowel sounds are normal. She exhibits  no distension and no mass. There is no tenderness.  Genitourinary: Vagina normal. No vaginal discharge found.  Vulvar irritation and dryness noted  Musculoskeletal: Normal range of motion.  Walks with cane  Neurological: She is alert and oriented to person, place, and time.  Skin: Skin is warm and dry.  Psychiatric: She has a normal mood and affect.     Labs reviewed: Basic Metabolic Panel:  Recent Labs  10/25/12 1407 11/28/12 1128 12/08/12 1455 03/20/13 1247  NA 136 133* 128* 135  K 3.8 4.8 4.8 5.4*  CL 98 96 99 104  CO2 27 25 22   --   GLUCOSE 119* 121* 105* 102*  BUN 9 18 26* 41*  CREATININE 0.67 0.90 1.0 0.80  CALCIUM 9.5 9.3 8.8  --    Liver Function Tests:  Recent Labs  11/28/12 1128 12/08/12 1455  AST 15 16  ALT 11 15  ALKPHOS 61 62  BILITOT 0.2* 0.5  PROT 7.0 6.6  ALBUMIN 3.7 3.8    Recent Labs  11/28/12 1128  LIPASE 38  CBC:  Recent Labs  10/25/12 1407 11/28/12 1128 12/08/12 1455 03/20/13 1238 03/20/13 1247  WBC 7.8 9.6  --  9.0  --   NEUTROABS 4.3 6.7  --  5.8  --   HGB 12.0 13.4 12.7 13.0 13.6  HCT 35.3* 40.2 37.2 38.4 40.0  MCV 88.0 89.7  --  94.3  --   PLT 276 290  --  266  --    Lab Results  Component Value Date   HGBA1C 6.0 11/18/2011    Assessment/Plan 1. Vulvar burning - improved with use of ice  2. Solitary pulmonary nodule -has f/u imaging study 4/22 -last CT showed no change  in nodule -says she would not want chemotherapy  3. HYPERLIPIDEMIA -not on any lipid-lowering therapy at this time - Lipid panel; Future - Comprehensive metabolic panel; Future  4. GERD -doing well now  5. Hearing loss -severe, recommended she ask her audiologist about an amplifier to help her hear voices better--has appt this week  6. Anemia, iron deficiency - CBC with Differential; Future to f/u h/h  7. Polyuria - Hemoglobin A1c; Future - Comprehensive metabolic panel; Future  8.  Knee osteoarthritis -on an otc supplement for this  9.  Neck pain -using only flexeril muscle relaxant no more than 2x per day with benefit and improving lately  10.  Peripheral neuropathy -await hba1c  Routine screening mammogram ordered due to sister with breast cancer.  Had zostavax at [facility].  Next appt:  3 mos

## 2013-07-18 ENCOUNTER — Other Ambulatory Visit: Payer: Self-pay | Admitting: *Deleted

## 2013-07-18 MED ORDER — GABAPENTIN 600 MG PO TABS: 600.0000 mg | ORAL_TABLET | Freq: Three times a day (TID) | ORAL | Status: DC

## 2013-07-18 NOTE — Telephone Encounter (Signed)
[facility] 

## 2013-07-20 ENCOUNTER — Ambulatory Visit (INDEPENDENT_AMBULATORY_CARE_PROVIDER_SITE_OTHER): Payer: Medicare Other | Admitting: Surgery

## 2013-07-20 ENCOUNTER — Encounter: Payer: Self-pay | Admitting: Surgery

## 2013-07-20 ENCOUNTER — Ambulatory Visit
Admission: RE | Admit: 2013-07-20 | Discharge: 2013-07-20 | Disposition: A | Discharge status: Home / Self Care | Payer: Medicare Other | Source: Ambulatory Visit | Attending: Surgery | Admitting: Surgery

## 2013-07-20 VITALS — BP 154/85 | HR 85 | Resp 20 | Ht 67.0 in | Wt 174.0 lb

## 2013-07-20 DIAGNOSIS — R911 Solitary pulmonary nodule: Secondary | ICD-10-CM

## 2013-07-20 DIAGNOSIS — D381 Neoplasm of uncertain behavior of trachea, bronchus and lung: Secondary | ICD-10-CM

## 2013-07-20 NOTE — Progress Notes (Signed)
HPI:  She returns today for follow-up of a left apical lung mass. The patient is a 80 year old woman who lives at home with her disabled son who suffered a fall in May 2014 after tripping over her son. She suffered a displaced right femoral neck fracture and a right wrist fracture. She had a CT scan of the neck done that showed a left apical lung mass. A chest CT on 08/25/2012 showed an irregular mass measuring 3.7 x 2.0 x 2.2 cm. She had her hip repaired in May and then her wrist repaired in June. A follow up CT of the chest on 12/08/2012 showed the irregular LUL mass to be unchanged. A PET scan on 01/06/2013 showed no change in the size of the mass but it did have some patchy hypermetabolic uptake with an SUV of 3.8. There was equivocal left hilar and right infrahilar nodal activity with SUV of 4.8 and 5.3, respectively. The background activity was 4.0. She has been a life-long nonsmoker.  When I saw her last October she was not felt to be a candidate for a lobectomy and was not interested in chemotherapy or radiation therapy if this was a cancer. Therefore I did not feel a biopsy was indicated and we decided to follow this lesion. She has continued to feel fairly well despite her age. She denies cough and sputum production.   Current Outpatient Prescriptions  Medication Sig Dispense Refill  . Calcium Glycerophosphate (PRELIEF PO) Take 1-2 tablets by mouth daily as needed (as needed for indigestion).       . cyclobenzaprine (FLEXERIL) 5 MG tablet Take 1 tablet (5 mg total) by mouth 3 (three) times daily as needed for muscle spasms.  30 tablet  2  . furosemide (LASIX) 40 MG tablet TAKE 1 TABLET BY MOUTH EVERY DAY  30 tablet  9  . gabapentin (NEURONTIN) 600 MG tablet Take 1 tablet (600 mg total) by mouth 3 (three) times daily.  90 tablet  3  . KLOR-CON M20 20 MEQ tablet Take 20 mEq by mouth once.       Marland Kitchen losartan (COZAAR) 50 MG tablet Take 50 mg by mouth daily.      . metoprolol succinate  (TOPROL-XL) 100 MG 24 hr tablet Take 100 mg by mouth daily. Take with or immediately following a meal.      . polyethylene glycol (MIRALAX / GLYCOLAX) packet Take 17 g by mouth daily as needed for mild constipation (constipation).       Orlie Dakin Sodium (SENNA S PO) Take 1 tablet by mouth as needed (constipation).        No current facility-administered medications for this visit.     Physical Exam: BP 154/85  Pulse 85  Resp 20  Ht 5\' 7"  (1.702 m)  Wt 174 lb (78.926 kg)  BMI 27.25 kg/m2  SpO2 98% Constitutional: She is oriented to person, place, and time.  Elderly, well-developed female in no distress. Can't hear anything. Her son is here with her.  Head: Normocephalic and atraumatic.  Mouth/Throat: Oropharynx is clear and moist.  Eyes: EOM are normal. Pupils are equal, round, and reactive to light.  Neck: Normal range of motion. Neck supple. No thyromegaly present.  Cardiovascular: Normal rate, regular rhythm, normal heart sounds and intact distal pulses. Exam reveals no gallop and no friction rub.  No murmur heard.  Pulmonary/Chest: Effort normal and breath sounds normal. No respiratory distress. She has no rales. She exhibits no tenderness.  Abdominal: Bowel sounds are normal. She exhibits no distension and no mass. There is no tenderness.  Musculoskeletal: Normal range of motion. She exhibits no edema.  Lymphadenopathy:  She has no cervical adenopathy.  Neurological: She is alert and oriented to person, place, and time. She has normal strength. No cranial nerve deficit or sensory deficit.  Skin: Skin is warm and dry.  Psychiatric: She has a normal mood and affect.    Diagnostic Tests:  CLINICAL DATA: Neoplasm of uncertain behavior of the lung, left  apex lung nodule, left hilar and right infrahilar nodal FDG  accumulation by PET  EXAM:  CT CHEST WITHOUT CONTRAST  TECHNIQUE:  Multidetector CT imaging of the chest was performed following the  standard  protocol without IV contrast. Sagittal and coronal MPR  images reconstructed from axial data set.  COMPARISON: PET-CT 01/06/2013, CT chest 12/08/2012  FINDINGS:  Atherosclerotic calcifications aorta and coronary arteries.  Dilated central pulmonary arteries.  Suboptimal assessment of hilar adenopathy due to lack of IV  contrast.  No mediastinal or axillary adenopathy.  Visualized portion of upper abdomen normal appearance.  Irregular left apex lung mass 4.6 x 2.5 x 2.6 cm image 8, previously  4.4 x 2.3 x 2.2 cm.  Question minimal satellite nodularity adjacent to left upper lobe  mass.  Remaining lungs clear.  Question additional 10 x 8 mm retro hilar left lower lobe mass image  27 unchanged.  No infiltrate, pleural effusion, pneumothorax or additional  mass/nodule.  Bones appear demineralized  IMPRESSION:  Slight increase in size of left apex lung mass with question minimal  satellite nodularity.  Questionable small retro hilar left lower lobe nodule 10 x 8 mm not  significantly changed.  Suboptimal assessment for hilar adenopathy due to lack of IV  contrast ; no mediastinal adenopathy identified.  Electronically Signed  By: Lavonia Dana M.D.  On: 07/20/2013 11:13   Impression:  There may be further slight enlargement of the left apical lung mass with minimal satellite nodularity and possibly a retro- hilar lung mass. She is still adamant that she would not want chemotherapy or radiation therapy which is reasonable at 80 years old. This lesion has changed minimally over the past 6 months so it may be slow-growing and she could do well for a couple years even if this is a cancer. I don't see any benefit of doing a biopsy with it's risk if she does not want any treatment. I have recommended continued follow up with a repeat CT scan in 6 months. At least this will give her and her family some prognostic information.  Plan:  She will return in 6 months for a CT scan of the  chest.

## 2013-07-27 ENCOUNTER — Ambulatory Visit
Admission: RE | Admit: 2013-07-27 | Discharge: 2013-07-27 | Disposition: A | Discharge status: Home / Self Care | Payer: Medicare Other | Source: Ambulatory Visit | Attending: Internal Medicine | Admitting: Internal Medicine

## 2013-07-27 DIAGNOSIS — Z1231 Encounter for screening mammogram for malignant neoplasm of breast: Secondary | ICD-10-CM

## 2013-08-24 ENCOUNTER — Other Ambulatory Visit: Payer: Self-pay | Admitting: *Deleted

## 2013-08-24 MED ORDER — METOPROLOL SUCCINATE ER 100 MG PO TB24
ORAL_TABLET | ORAL | Status: DC
Start: 2013-08-24 — End: 2013-10-19

## 2013-08-24 NOTE — Telephone Encounter (Signed)
[facility] 

## 2013-09-05 ENCOUNTER — Other Ambulatory Visit: Payer: Self-pay | Admitting: Internal Medicine

## 2013-09-14 ENCOUNTER — Ambulatory Visit: Payer: Medicare Other | Admitting: Podiatry

## 2013-09-16 ENCOUNTER — Encounter: Payer: Self-pay | Admitting: Podiatry

## 2013-09-16 ENCOUNTER — Ambulatory Visit (INDEPENDENT_AMBULATORY_CARE_PROVIDER_SITE_OTHER): Payer: Medicare Other | Admitting: Podiatry

## 2013-09-16 VITALS — BP 150/74 | HR 76

## 2013-09-16 DIAGNOSIS — M79609 Pain in unspecified limb: Secondary | ICD-10-CM

## 2013-09-16 DIAGNOSIS — L600 Ingrowing nail: Secondary | ICD-10-CM

## 2013-09-16 DIAGNOSIS — B351 Tinea unguium: Secondary | ICD-10-CM

## 2013-09-16 DIAGNOSIS — M79604 Pain in right leg: Secondary | ICD-10-CM

## 2013-09-16 NOTE — Progress Notes (Signed)
Subjective:  80 y.o. year old female patient presents requesting toe nails trimmed.  Stated that her right great toe nail is ingrown and been hurting all last week.   Objective: Dermatologic:  All nails are mildly hypertrophic and elongated.  Cryptotic nail both great toe medial border symptomatic.  Vascular:  Dorsalis pedis arteries are not palpable on both.  Posterior tibial pulses are palpable on both.  Orthopedic:  Rectus foot with mild digital contracture right foot.  Neurologic:  Decreased sensory perception to Monofilament sensory testing bilateral.   Assessment:  Dystrophic mycotic nails x 10.  Ingrown nail both great toes.  Peripheral neuropathy.   Treatment: All mycotic nails debrided.  Return in 3 months or as needed.

## 2013-09-16 NOTE — Patient Instructions (Signed)
Seen for hypertrophic nails. All nails debrided. Return in 3 months or as needed.  

## 2013-10-03 ENCOUNTER — Ambulatory Visit (INDEPENDENT_AMBULATORY_CARE_PROVIDER_SITE_OTHER): Payer: Medicare Other | Admitting: Internal Medicine

## 2013-10-03 ENCOUNTER — Encounter: Payer: Self-pay | Admitting: Internal Medicine

## 2013-10-03 VITALS — BP 158/90 | HR 70 | Temp 98.5°F | Wt 177.0 lb

## 2013-10-03 DIAGNOSIS — N9489 Other specified conditions associated with female genital organs and menstrual cycle: Secondary | ICD-10-CM

## 2013-10-03 DIAGNOSIS — H9193 Unspecified hearing loss, bilateral: Secondary | ICD-10-CM

## 2013-10-03 DIAGNOSIS — R911 Solitary pulmonary nodule: Secondary | ICD-10-CM

## 2013-10-03 DIAGNOSIS — H9190 Unspecified hearing loss, unspecified ear: Secondary | ICD-10-CM

## 2013-10-03 DIAGNOSIS — R358 Other polyuria: Secondary | ICD-10-CM

## 2013-10-03 DIAGNOSIS — R3589 Other polyuria: Secondary | ICD-10-CM

## 2013-10-03 DIAGNOSIS — N949 Unspecified condition associated with female genital organs and menstrual cycle: Secondary | ICD-10-CM

## 2013-10-03 DIAGNOSIS — I10 Essential (primary) hypertension: Secondary | ICD-10-CM

## 2013-10-03 DIAGNOSIS — E785 Hyperlipidemia, unspecified: Secondary | ICD-10-CM

## 2013-10-03 DIAGNOSIS — M62838 Other muscle spasm: Secondary | ICD-10-CM

## 2013-10-03 DIAGNOSIS — G4700 Insomnia, unspecified: Secondary | ICD-10-CM

## 2013-10-03 DIAGNOSIS — D509 Iron deficiency anemia, unspecified: Secondary | ICD-10-CM

## 2013-10-03 MED ORDER — CYCLOBENZAPRINE HCL 5 MG PO TABS: 5.0000 mg | ORAL_TABLET | Freq: Three times a day (TID) | ORAL | Status: DC | PRN

## 2013-10-03 NOTE — Progress Notes (Signed)
Patient ID: [name], female   DOB: 08/17/1915, 80 y.o.   MRN: [id number]   Location:  [facility]  Code Status: DNR  Allergies  Allergen Reactions  . Aleve [Naproxen Sodium] Swelling    Legs and feet    Chief Complaint  Patient presents with  . Medical Management of Chronic Issues    blood pressure, cholesterol, anemia. Here with [name]    HPI: Patient is a 80 y.o. white female seen in the office today for medical mgt of chronic diseases.    Was meant to have labs before visit, but did not.    Went to dentist for filling last month and bothering her since. Has gotten worse with pain.  Is afraid it will have to be pulled.  Has appt at 4pm today for that.  Is going to have cataract surgery on left eye in one week.  Went to [facility].  Saw a lady doctor.  Has no glaucoma or problems otherwise.  Can't read paper with left eye only.  Had physical done there.    Went to podiatrist 6/19.  Had nails debrided and both great toenails were debrided  Continues on gabapentin for her nerve pain.  Works pretty well.  Has occasional muscle spasms.    Has a lot of stress with daughter having cancer and recent PE.  Chemo affected her heart.  Does not sleep well.  Says she has a sister who takes something that works well.  Pt herself has pulmonary nodule due to imaging again by Dr [name] in October.  No weight loss, sob or other related symptoms.  Review of Systems:  Review of Systems  Constitutional: Negative for fever and malaise/fatigue.  HENT: Positive for hearing loss. Negative for congestion.   Eyes: Positive for blurred vision.       Left eye  Respiratory: Negative for cough and shortness of breath.   Cardiovascular: Negative for chest pain.  Gastrointestinal: Negative for heartburn.  Genitourinary: Negative for dysuria.  Musculoskeletal: Positive for myalgias. Negative for falls.  Neurological: Negative for weakness  and headaches.  Psychiatric/Behavioral: The patient is nervous/anxious and has insomnia.      Past Medical History  Diagnosis Date  . History of hiatal hernia   . Mitral valve prolapse   . Plantar fasciitis   . Hay fever   . GERD (gastroesophageal reflux disease)   . Palpitations   . Hypertension   . Neuromuscular disorder   . Deafness     "very HOH; even w/hearing aides we have to communicate via writing things down for her to read" (08/25/2012)  . H/O hiatal hernia   . Arthritis     "a little all over" (08/25/2012)  . Lung mass     Hx: of  . Anxiety   . Hemorrhoids     Past Surgical History  Procedure Laterality Date  . Foot surgery Bilateral ~ 2000    "pinched nerve on feet" (08/25/2012)  . Vesicovaginal fistula closure w/ tah    . Abdominal hysterectomy  1998  . Tonsillectomy and adenoidectomy  1940's  . Cesarean section  1952  . Knee arthroscopy w/ meniscectomy Left 07/15/2010    partial medial and lateral meniscectomies.Archie Endo 07/15/2010 (08/25/2012)  . Hip arthroplasty Right 08/26/2012    Procedure: ARTHROPLASTY BIPOLAR HIP;  Surgeon: Mauri Pole, MD;  Location: [place];  Service: Orthopedics;  Laterality: Right;  . Open reduction internal fixation (orif) distal radial  fracture Right 09/11/2012    Procedure: OPEN REDUCTION INTERNAL FIXATION (ORIF) RIGHT DISTAL RADIAL FRACTURE/ULNAR POSSIBLE BONE GRAFTING;  Surgeon: Linna Hoff, MD;  Location: [place];  Service: Orthopedics;  Laterality: Right;    Social History:   reports that she has never smoked. She has never used smokeless tobacco. She reports that she does not drink alcohol or use illicit drugs.  Family History  Problem Relation Age of Onset  . Colon cancer Mother   . Colon cancer Sister   . Cancer Sister     Breast  . Colon cancer Brother   . Heart disease Brother     Heart Attack  . Cancer Father     Leukemia    Medications: Patient's Medications  New Prescriptions   No medications on file    Previous Medications   CALCIUM GLYCEROPHOSPHATE (PRELIEF PO)    Take 1-2 tablets by mouth daily as needed (as needed for indigestion).    CYCLOBENZAPRINE (FLEXERIL) 5 MG TABLET    Take 1 tablet (5 mg total) by mouth 3 (three) times daily as needed for muscle spasms.   FUROSEMIDE (LASIX) 40 MG TABLET    TAKE 1 TABLET BY MOUTH EVERY DAY   GABAPENTIN (NEURONTIN) 600 MG TABLET    Take 1 tablet (600 mg total) by mouth 3 (three) times daily.   KLOR-CON M20 20 MEQ TABLET    Take 20 mEq by mouth once.    LOSARTAN (COZAAR) 50 MG TABLET    Take 50 mg by mouth daily.   METOPROLOL SUCCINATE (TOPROL-XL) 100 MG 24 HR TABLET    Take one tablet by mouth once daily.   POLYETHYLENE GLYCOL (MIRALAX / GLYCOLAX) PACKET    Take 17 g by mouth daily as needed for mild constipation (constipation).    SENNOSIDES-DOCUSATE SODIUM (SENNA S PO)    Take 1 tablet by mouth as needed (constipation).   Modified Medications   No medications on file  Discontinued Medications   KLOR-CON M20 20 MEQ TABLET    TAKE 2 TABLETS (40 MEQ TOTAL) BY MOUTH DAILY.     Physical Exam: Filed Vitals:   10/03/13 1122  BP: 158/90  Pulse: 70  Temp: 98.5 F (36.9 C)  TempSrc: Oral  Weight: 177 lb (80.287 kg)  SpO2: 98%  Physical Exam  Constitutional: She is oriented to person, place, and time. She appears well-developed and well-nourished. No distress.  HENT:  Very HOH  Cardiovascular: Normal rate, regular rhythm, normal heart sounds and intact distal pulses.   Pulmonary/Chest: Effort normal and breath sounds normal. No respiratory distress.  Abdominal: Soft. Bowel sounds are normal. She exhibits no distension and no mass. There is no tenderness.  Musculoskeletal: Normal range of motion. She exhibits no edema and no tenderness.  Neurological: She is alert and oriented to person, place, and time.  Skin: Skin is warm and dry.  Psychiatric: She has a normal mood and affect.    Labs reviewed: Basic Metabolic Panel:  Recent Labs   10/25/12 1407 11/28/12 1128 12/08/12 1455 03/20/13 1247  NA 136 133* 128* 135  K 3.8 4.8 4.8 5.4*  CL 98 96 99 104  CO2 27 25 22   --   GLUCOSE 119* 121* 105* 102*  BUN 9 18 26* 41*  CREATININE 0.67 0.90 1.0 0.80  CALCIUM 9.5 9.3 8.8  --    Liver Function Tests:  Recent Labs  11/28/12 1128 12/08/12 1455  AST 15 16  ALT 11 15  ALKPHOS 61 62  BILITOT 0.2* 0.5  PROT 7.0 6.6  ALBUMIN 3.7 3.8    Recent Labs  11/28/12 1128  LIPASE 38   No results found for this basename: AMMONIA,  in the last 8760 hours CBC:  Recent Labs  10/25/12 1407 11/28/12 1128 12/08/12 1455 03/20/13 1238 03/20/13 1247  WBC 7.8 9.6  --  9.0  --   NEUTROABS 4.3 6.7  --  5.8  --   HGB 12.0 13.4 12.7 13.0 13.6  HCT 35.3* 40.2 37.2 38.4 40.0  MCV 88.0 89.7  --  94.3  --   PLT 276 290  --  266  --    Lipid Panel: No results found for this basename: CHOL, HDL, LDLCALC, TRIG, CHOLHDL, LDLDIRECT,  in the last 8760 hours Lab Results  Component Value Date   HGBA1C 6.0 11/18/2011     Assessment/Plan 1. Muscle spasms of both lower extremities -continues to have these occasionally and has been helped with flexeril--aware of potential risks of medication - cyclobenzaprine (FLEXERIL) 5 MG tablet; Take 1 tablet (5 mg total) by mouth 3 (three) times daily as needed for muscle spasms.  Dispense: 30 tablet; Refill: 2  2. Solitary pulmonary nodule -keep f/u CT plan for October  3. Hearing loss, bilateral -she is VERY HOH and it is hard to talk to her b/c of this--she interrupts over and over when I do speak -will recommend an amplification device next time   4. Essential hypertension, benign -bp elevated upon arrival but recheck less than 150/90  5. Insomnia -continues some nights--using melatonin with some help, but would like to try what her sister is taking--she is going to call with the name (?[email or web address])--would not prescribe any others due to potential side effects  Labs/tests ordered:  Labs  today that she didn't get before her visit  Next appt:  3 mos

## 2013-10-04 LAB — CBC WITH DIFFERENTIAL/PLATELET
Basophils Absolute: 0 10*3/uL (ref 0.0–0.2)
Basos: 1 %
Eos: 3 %
Eosinophils Absolute: 0.2 10*3/uL (ref 0.0–0.4)
HCT: 38.5 % (ref 34.0–46.6)
Hemoglobin: 13 g/dL (ref 11.1–15.9)
Immature Grans (Abs): 0 10*3/uL (ref 0.0–0.1)
Immature Granulocytes: 0 %
Lymphocytes Absolute: 2.4 10*3/uL (ref 0.7–3.1)
Lymphs: 30 %
MCH: 31.1 pg (ref 26.6–33.0)
MCHC: 33.8 g/dL (ref 31.5–35.7)
MCV: 92 fL (ref 79–97)
Monocytes Absolute: 0.7 10*3/uL (ref 0.1–0.9)
Monocytes: 9 %
Neutrophils Absolute: 4.5 10*3/uL (ref 1.4–7.0)
Neutrophils Relative %: 57 %
RBC: 4.18 x10E6/uL (ref 3.77–5.28)
RDW: 14.2 % (ref 12.3–15.4)
WBC: 7.9 10*3/uL (ref 3.4–10.8)

## 2013-10-04 LAB — COMPREHENSIVE METABOLIC PANEL
ALT: 12 IU/L (ref 0–32)
AST: 18 IU/L (ref 0–40)
Albumin/Globulin Ratio: 2 (ref 1.1–2.5)
Albumin: 4.5 g/dL (ref 3.2–4.6)
Alkaline Phosphatase: 81 IU/L (ref 39–117)
BUN/Creatinine Ratio: 32 — ABNORMAL HIGH (ref 11–26)
BUN: 28 mg/dL (ref 10–36)
CO2: 24 mmol/L (ref 18–29)
Calcium: 9.9 mg/dL (ref 8.7–10.3)
Chloride: 99 mmol/L (ref 97–108)
Creatinine, Ser: 0.87 mg/dL (ref 0.57–1.00)
GFR calc Af Amer: 65 mL/min/{1.73_m2} (ref >59)
GFR calc non Af Amer: 57 mL/min/{1.73_m2} — ABNORMAL LOW (ref >59)
Globulin, Total: 2.3 g/dL (ref 1.5–4.5)
Glucose: 101 mg/dL — ABNORMAL HIGH (ref 65–99)
Potassium: 5.7 mmol/L — ABNORMAL HIGH (ref 3.5–5.2)
Sodium: 140 mmol/L (ref 134–144)
Total Bilirubin: 0.2 mg/dL (ref 0.0–1.2)
Total Protein: 6.8 g/dL (ref 6.0–8.5)

## 2013-10-04 LAB — HEMOGLOBIN A1C
Est. average glucose Bld gHb Est-mCnc: 126 mg/dL
Hgb A1c MFr Bld: 6 % — ABNORMAL HIGH (ref 4.8–5.6)

## 2013-10-19 ENCOUNTER — Emergency Department (HOSPITAL_COMMUNITY): Payer: Medicare Other

## 2013-10-19 ENCOUNTER — Encounter (HOSPITAL_COMMUNITY): Payer: Self-pay | Admitting: Emergency Medicine

## 2013-10-19 ENCOUNTER — Emergency Department (HOSPITAL_COMMUNITY)
Admission: EM | Admit: 2013-10-19 | Discharge: 2013-10-19 | Disposition: A | Discharge status: Home / Self Care | Payer: Medicare Other | Attending: Emergency Medicine | Admitting: Emergency Medicine

## 2013-10-19 DIAGNOSIS — H913 Deaf nonspeaking, not elsewhere classified: Secondary | ICD-10-CM | POA: Diagnosis not present

## 2013-10-19 DIAGNOSIS — Z79899 Other long term (current) drug therapy: Secondary | ICD-10-CM | POA: Insufficient documentation

## 2013-10-19 DIAGNOSIS — R42 Dizziness and giddiness: Secondary | ICD-10-CM | POA: Diagnosis not present

## 2013-10-19 DIAGNOSIS — I059 Rheumatic mitral valve disease, unspecified: Secondary | ICD-10-CM | POA: Insufficient documentation

## 2013-10-19 DIAGNOSIS — M542 Cervicalgia: Secondary | ICD-10-CM | POA: Insufficient documentation

## 2013-10-19 DIAGNOSIS — I10 Essential (primary) hypertension: Secondary | ICD-10-CM | POA: Insufficient documentation

## 2013-10-19 DIAGNOSIS — F411 Generalized anxiety disorder: Secondary | ICD-10-CM | POA: Insufficient documentation

## 2013-10-19 DIAGNOSIS — Z8709 Personal history of other diseases of the respiratory system: Secondary | ICD-10-CM | POA: Diagnosis not present

## 2013-10-19 DIAGNOSIS — M129 Arthropathy, unspecified: Secondary | ICD-10-CM | POA: Insufficient documentation

## 2013-10-19 LAB — URINALYSIS, ROUTINE W REFLEX MICROSCOPIC
Bilirubin Urine: NEGATIVE
Glucose, UA: NEGATIVE mg/dL
Hgb urine dipstick: NEGATIVE
KETONES UR: NEGATIVE mg/dL
LEUKOCYTES UA: NEGATIVE
NITRITE: NEGATIVE
Protein, ur: NEGATIVE mg/dL
SPECIFIC GRAVITY, URINE: 1.02 (ref 1.005–1.030)
UROBILINOGEN UA: 0.2 mg/dL (ref 0.0–1.0)
pH: 7 (ref 5.0–8.0)

## 2013-10-19 LAB — BASIC METABOLIC PANEL
ANION GAP: 14 (ref 5–15)
BUN: 22 mg/dL (ref 6–23)
CHLORIDE: 95 meq/L — AB (ref 96–112)
CO2: 23 mEq/L (ref 19–32)
CREATININE: 0.59 mg/dL (ref 0.50–1.10)
Calcium: 8.9 mg/dL (ref 8.4–10.5)
GFR calc non Af Amer: 75 mL/min — ABNORMAL LOW (ref >90)
GFR, EST AFRICAN AMERICAN: 87 mL/min — AB (ref >90)
Glucose, Bld: 126 mg/dL — ABNORMAL HIGH (ref 70–99)
Potassium: 3.9 mEq/L (ref 3.7–5.3)
SODIUM: 132 meq/L — AB (ref 137–147)

## 2013-10-19 LAB — CBC WITH DIFFERENTIAL/PLATELET
BASOS ABS: 0 10*3/uL (ref 0.0–0.1)
BASOS PCT: 0 % (ref 0–1)
Eosinophils Absolute: 0.1 10*3/uL (ref 0.0–0.7)
Eosinophils Relative: 1 % (ref 0–5)
HEMATOCRIT: 37.3 % (ref 36.0–46.0)
Hemoglobin: 12.7 g/dL (ref 12.0–15.0)
LYMPHS PCT: 22 % (ref 12–46)
Lymphs Abs: 1.4 10*3/uL (ref 0.7–4.0)
MCH: 31.8 pg (ref 26.0–34.0)
MCHC: 34 g/dL (ref 30.0–36.0)
MCV: 93.5 fL (ref 78.0–100.0)
MONO ABS: 0.5 10*3/uL (ref 0.1–1.0)
Monocytes Relative: 7 % (ref 3–12)
NEUTROS ABS: 4.4 10*3/uL (ref 1.7–7.7)
Neutrophils Relative %: 70 % (ref 43–77)
Platelets: 231 10*3/uL (ref 150–400)
RBC: 3.99 MIL/uL (ref 3.87–5.11)
RDW: 12.7 % (ref 11.5–15.5)
WBC: 6.3 10*3/uL (ref 4.0–10.5)

## 2013-10-19 LAB — I-STAT TROPONIN, ED: Troponin i, poc: 0 ng/mL (ref 0.00–0.08)

## 2013-10-19 MED ORDER — ONDANSETRON 4 MG PO TBDP
4.0000 mg | ORAL_TABLET | Freq: Once | ORAL | Status: AC
  Administered 2013-10-19: 4 mg via ORAL
  Filled 2013-10-19: qty 1

## 2013-10-19 MED ORDER — HYDROCODONE-ACETAMINOPHEN 5-325 MG PO TABS
1.0000 | ORAL_TABLET | Freq: Once | ORAL | Status: AC
  Administered 2013-10-19: 1 via ORAL
  Filled 2013-10-19: qty 1

## 2013-10-19 MED ORDER — GADOBENATE DIMEGLUMINE 529 MG/ML IV SOLN
15.0000 mL | Freq: Once | INTRAVENOUS | Status: AC
  Administered 2013-10-19: 15 mL via INTRAVENOUS

## 2013-10-19 NOTE — Discharge Instructions (Signed)
You may take Tylenol 1000mg  every 6 hours as needed for pain, this medication is over the counter.   Benign Positional Vertigo Vertigo means you feel like you or your surroundings are moving when they are not. Benign positional vertigo is the most common form of vertigo. Benign means that the cause of your condition is not serious. Benign positional vertigo is more common in older adults. CAUSES  Benign positional vertigo is the result of an upset in the labyrinth system. This is an area in the middle ear that helps control your balance. This may be caused by a viral infection, head injury, or repetitive motion. However, often no specific cause is found. SYMPTOMS  Symptoms of benign positional vertigo occur when you move your head or eyes in different directions. Some of the symptoms may include:  Loss of balance and falls.  Vomiting.  Blurred vision.  Dizziness.  Nausea.  Involuntary eye movements (nystagmus). DIAGNOSIS  Benign positional vertigo is usually diagnosed by physical exam. If the specific cause of your benign positional vertigo is unknown, your caregiver may perform imaging tests, such as magnetic resonance imaging (MRI) or computed tomography (CT). TREATMENT  Your caregiver may recommend movements or procedures to correct the benign positional vertigo. Medicines such as meclizine, benzodiazepines, and medicines for nausea may be used to treat your symptoms. In rare cases, if your symptoms are caused by certain conditions that affect the inner ear, you may need surgery. HOME CARE INSTRUCTIONS   Follow your caregiver's instructions.  Move slowly. Do not make sudden body or head movements.  Avoid driving.  Avoid operating heavy machinery.  Avoid performing any tasks that would be dangerous to you or others during a vertigo episode.  Drink enough fluids to keep your urine clear or pale yellow. SEEK IMMEDIATE MEDICAL CARE IF:   You develop problems with walking,  weakness, numbness, or using your arms, hands, or legs.  You have difficulty speaking.  You develop severe headaches.  Your nausea or vomiting continues or gets worse.  You develop visual changes.  Your family or friends notice any behavioral changes.  Your condition gets worse.  You have a fever.  You develop a stiff neck or sensitivity to light. MAKE SURE YOU:   Understand these instructions.  Will watch your condition.  Will get help right away if you are not doing well or get worse. Document Released: 12/23/2005 Document Revised: 06/09/2011 Document Reviewed: 12/05/2010 [facility] Patient Information 2015 [place], [place]. This information is not intended to replace advice given to you by your health care provider. Make sure you discuss any questions you have with your health care provider.   Arthritis, Nonspecific Arthritis is inflammation of a joint. This usually means pain, redness, warmth or swelling are present. One or more joints may be involved. There are a number of types of arthritis. Your caregiver may not be able to tell what type of arthritis you have right away. CAUSES  The most common cause of arthritis is the wear and tear on the joint (osteoarthritis). This causes damage to the cartilage, which can break down over time. The knees, hips, back and neck are most often affected by this type of arthritis. Other types of arthritis and common causes of joint pain include:  Sprains and other injuries near the joint. Sometimes minor sprains and injuries cause pain and swelling that develop hours later.  Rheumatoid arthritis. This affects hands, feet and knees. It usually affects both sides of your body at the same  time. It is often associated with chronic ailments, fever, weight loss and general weakness.  Crystal arthritis. Gout and pseudo gout can cause occasional acute severe pain, redness and swelling in the foot, ankle, or knee.  Infectious arthritis. Bacteria can  get into a joint through a break in overlying skin. This can cause infection of the joint. Bacteria and viruses can also spread through the blood and affect your joints.  Drug, infectious and allergy reactions. Sometimes joints can become mildly painful and slightly swollen with these types of illnesses. SYMPTOMS   Pain is the main symptom.  Your joint or joints can also be red, swollen and warm or hot to the touch.  You may have a fever with certain types of arthritis, or even feel overall ill.  The joint with arthritis will hurt with movement. Stiffness is present with some types of arthritis. DIAGNOSIS  Your caregiver will suspect arthritis based on your description of your symptoms and on your exam. Testing may be needed to find the type of arthritis:  Blood and sometimes urine tests.  X-ray tests and sometimes CT or MRI scans.  Removal of fluid from the joint (arthrocentesis) is done to check for bacteria, crystals or other causes. Your caregiver (or a specialist) will numb the area over the joint with a local anesthetic, and use a needle to remove joint fluid for examination. This procedure is only minimally uncomfortable.  Even with these tests, your caregiver may not be able to tell what kind of arthritis you have. Consultation with a specialist (rheumatologist) may be helpful. TREATMENT  Your caregiver will discuss with you treatment specific to your type of arthritis. If the specific type cannot be determined, then the following general recommendations may apply. Treatment of severe joint pain includes:  Rest.  Elevation.  Anti-inflammatory medication (for example, ibuprofen) may be prescribed. Avoiding activities that cause increased pain.  Only take over-the-counter or prescription medicines for pain and discomfort as recommended by your caregiver.  Cold packs over an inflamed joint may be used for 10 to 15 minutes every hour. Hot packs sometimes feel better, but do not  use overnight. Do not use hot packs if you are diabetic without your caregiver's permission.  A cortisone shot into arthritic joints may help reduce pain and swelling.  Any acute arthritis that gets worse over the next 1 to 2 days needs to be looked at to be sure there is no joint infection. Long-term arthritis treatment involves modifying activities and lifestyle to reduce joint stress jarring. This can include weight loss. Also, exercise is needed to nourish the joint cartilage and remove waste. This helps keep the muscles around the joint strong. HOME CARE INSTRUCTIONS   Do not take aspirin to relieve pain if gout is suspected. This elevates uric acid levels.  Only take over-the-counter or prescription medicines for pain, discomfort or fever as directed by your caregiver.  Rest the joint as much as possible.  If your joint is swollen, keep it elevated.  Use crutches if the painful joint is in your leg.  Drinking plenty of fluids may help for certain types of arthritis.  Follow your caregiver's dietary instructions.  Try low-impact exercise such as:  Swimming.  Water aerobics.  Biking.  Walking.  Morning stiffness is often relieved by a warm shower.  Put your joints through regular range-of-motion. SEEK MEDICAL CARE IF:   You do not feel better in 24 hours or are getting worse.  You have side effects to  medications, or are not getting better with treatment. SEEK IMMEDIATE MEDICAL CARE IF:   You have a fever.  You develop severe joint pain, swelling or redness.  Many joints are involved and become painful and swollen.  There is severe back pain and/or leg weakness.  You have loss of bowel or bladder control. Document Released: 04/24/2004 Document Revised: 06/09/2011 Document Reviewed: 05/10/2008 [facility] Patient Information 2015 [place], [place]. This information is not intended to replace advice given to you by your health care provider. Make sure you discuss any  questions you have with your health care provider.

## 2013-10-19 NOTE — ED Provider Notes (Signed)
TIME SEEN: 10:15 AM  CHIEF COMPLAINT: Headache, vertigo, neck pain  HPI: Patient is a 80 year old female with history of hypertension, deafness, arthritis with some history of neck pain who presents to the emergency department with complaints of worsening posterior neck pain that is not worse with movement or palpation of her neck that started at 5 PM last night with associated headache and vertigo. She is an extremely poor historian and history is further limited by the fact that she is extremely hard of hearing. She states she has had nausea with her vertigo but no vomiting or diarrhea. Denies any chest pain or chest discomfort, shortness of breath. No numbness, tingling or focal weakness that is new. No bowel or bladder incontinence. No recent fevers, tick bite. No recent head injury. She is not on anticoagulation.  ROS: See HPI Constitutional: no fever  Eyes: no drainage  ENT: no runny nose   Cardiovascular:  no chest pain  Resp: no SOB  GI: no vomiting GU: no dysuria Integumentary: no rash  Allergy: no hives  Musculoskeletal: no leg swelling  Neurological: no slurred speech ROS otherwise negative  PAST MEDICAL HISTORY/PAST SURGICAL HISTORY:  Past Medical History  Diagnosis Date  . History of hiatal hernia   . Mitral valve prolapse   . Plantar fasciitis   . Hay fever   . GERD (gastroesophageal reflux disease)   . Palpitations   . Hypertension   . Neuromuscular disorder   . Deafness     "very HOH; even w/hearing aides we have to communicate via writing things down for her to read" (08/25/2012)  . H/O hiatal hernia   . Arthritis     "a little all over" (08/25/2012)  . Lung mass     Hx: of  . Anxiety   . Hemorrhoids     MEDICATIONS:  Prior to Admission medications   Medication Sig Start Date End Date Taking? Authorizing Provider  acetaminophen (TYLENOL) 325 MG tablet Take 325-650 mg by mouth every 4 (four) hours as needed for mild pain.   Yes Historical Provider, MD   Calcium Glycerophosphate (PRELIEF PO) Take 65 mg by mouth daily as needed (as needed for indigestion).    Yes Historical Provider, MD  cyclobenzaprine (FLEXERIL) 5 MG tablet Take 1 tablet (5 mg total) by mouth 3 (three) times daily as needed for muscle spasms. 10/03/13  Yes Tiffany L Reed, DO  DUREZOL 0.05 % EMUL Apply 1 drop to eye daily. For 2 weeks, then d/c 10/11/13 10/25/13 Yes Historical Provider, MD  furosemide (LASIX) 40 MG tablet Take 40 mg by mouth daily.   Yes Historical Provider, MD  gabapentin (NEURONTIN) 600 MG tablet Take 1 tablet (600 mg total) by mouth 3 (three) times daily. 07/18/13  Yes Tiffany L Reed, DO  ILEVRO 0.3 % SUSP Apply 1 drop to eye daily. For 2 weeks, then d/c 10/11/13 10/25/13 Yes Historical Provider, MD  KLOR-CON M20 20 MEQ tablet Take 20 mEq by mouth daily.  01/11/13  Yes Historical Provider, MD  losartan (COZAAR) 50 MG tablet Take 50 mg by mouth daily.   Yes Historical Provider, MD  meclizine (ANTIVERT) 25 MG tablet Take 25 mg by mouth every 6 (six) hours as needed for dizziness or nausea.   Yes Historical Provider, MD  metoprolol succinate (TOPROL-XL) 100 MG 24 hr tablet Take 100 mg by mouth daily. Take with or immediately following a meal.   Yes Historical Provider, MD  polyethylene glycol (MIRALAX / GLYCOLAX) packet Take 17 g  by mouth daily as needed for mild constipation (constipation).    Yes Historical Provider, MD  sennosides-docusate sodium (SENOKOT-S) 8.6-50 MG tablet Take 1 tablet by mouth 2 (two) times daily.   Yes Historical Provider, MD  sertraline (ZOLOFT) 50 MG tablet Take 50 mg by mouth daily. For anxiety   Yes Historical Provider, MD    ALLERGIES:  Allergies  Allergen Reactions  . Aleve [Naproxen Sodium] Swelling    Legs and feet    SOCIAL HISTORY:  History  Substance Use Topics  . Smoking status: Never Smoker   . Smokeless tobacco: Never Used  . Alcohol Use: No    FAMILY HISTORY: Family History  Problem Relation Age of Onset  . Colon  cancer Mother   . Colon cancer Sister   . Cancer Sister     Breast  . Colon cancer Brother   . Heart disease Brother     Heart Attack  . Cancer Father     Leukemia    EXAM: BP 163/68  Pulse 71  Temp(Src) 98.3 F (36.8 C) (Oral)  Resp 15  SpO2 96% CONSTITUTIONAL: Alert and oriented and responds appropriately to questions. Well-appearing; well-nourished HEAD: Normocephalic EYES: Conjunctivae clear, PERRL ENT: normal nose; no rhinorrhea; moist mucous membranes; pharynx without lesions noted NECK: Supple, no meningismus, no LAD, patient has mild midline spinal tenderness without step-off or deformity  CARD: RRR; S1 and S2 appreciated; no murmurs, no clicks, no rubs, no gallops RESP: Normal chest excursion without splinting or tachypnea; breath sounds clear and equal bilaterally; no wheezes, no rhonchi, no rales,  ABD/GI: Normal bowel sounds; non-distended; soft, non-tender, no rebound, no guarding BACK:  The back appears normal and is non-tender to palpation, there is no CVA tenderness EXT: Normal ROM in all joints; non-tender to palpation; no edema; normal capillary refill; no cyanosis    SKIN: Normal color for age and race; warm NEURO: Moves all extremities equally, strength 5/5 in all 4 extremities, sensation to light touch intact diffusely, cranial nerves II through XII intact PSYCH: The patient's mood and manner are appropriate. Grooming and personal hygiene are appropriate.  MEDICAL DECISION MAKING: Patient here with headache, vertigo, neck pain. Differential diagnosis includes CVA, carotid or vertebral artery dissection or occlusion, arthritis, peripheral vertigo. Given her age, comorbidities we'll obtain MRIs of her head and cervical spine and MRA of her neck to evaluate for possible infarct or vessel abnormality. We'll give pain medication. We'll also obtain labs, urine.  ED PROGRESS: Patient's MRIs showed no significant abnormality. MRI brain and MRA neck are negative. MRI of  her cervical spine shows multiple areas of degenerative disease and mild spinal stenosis but no significant abnormality. Discussed with patient that I feel this is likely secondary to her chronic arthritis and maybe some benign positional vertigo, peripheral vertigo. I feel she is safe to be discharged home. We'll have her use Tylenol over-the-counter for pain. She has outpatient followup. Discussed strict return precautions and supportive care instructions. Family verbalize understanding and is comfortable with plan.     EKG Interpretation  Date/Time:  Wednesday October 19 2013 11:27:10 EDT Ventricular Rate:  73 PR Interval:  179 QRS Duration: 90 QT Interval:  444 QTC Calculation: 489 R Axis:   32 Text Interpretation:  Sinus rhythm Borderline prolonged QT interval Confirmed by Emile Ringgenberg,  DO, Adalyn Pennock [phone]) on 10/19/2013 11:35:44 AM        Delice Bison Delio Slates, DO 10/19/13 1724

## 2013-10-19 NOTE — ED Notes (Signed)
Patient moved to pod c to await results of her mris

## 2013-10-19 NOTE — ED Notes (Addendum)
Received pt from home with c/o neck pain. Upon arrival pt began to c/o nausea. Pt reported dizziness last night. Pt denies injury. Pt is hard of hearing. Pt has history of arthritis in her neck.

## 2013-10-19 NOTE — ED Notes (Signed)
Pt remains in MRI 

## 2013-10-19 NOTE — ED Notes (Signed)
Dr ward at bedside discussing results

## 2013-10-19 NOTE — ED Notes (Signed)
Reminded pt family member to encourage pt to use her cane or walker when ambulating. Also to change positions slowly while dizziness persists

## 2013-10-21 ENCOUNTER — Telehealth: Payer: Self-pay | Admitting: *Deleted

## 2013-10-21 NOTE — Telephone Encounter (Signed)
Patient called and spoke with Lattie Haw in Medical Records. Lisa sent call to me and stated that the patient was very confused and slurred speech. I spoke with patient and her speech was very slurred and patient very confused and she stated that she was very dizzy and off the wall. I had a hard time understanding patient so I asked her to put her son on the phone. Spoke with [place], son, and i asked him what was going on and he stated that she was terribly dizzy and confused. I asked him about her slurred speech and he stated that that has gotten a lot worse. I advised the son to take her to the ER or Urgent Care. He Agreed. Sent message to Dr. Mariea Clonts.

## 2013-10-22 ENCOUNTER — Encounter (HOSPITAL_COMMUNITY): Payer: Self-pay | Admitting: Emergency Medicine

## 2013-10-22 ENCOUNTER — Emergency Department (INDEPENDENT_AMBULATORY_CARE_PROVIDER_SITE_OTHER)
Admission: EM | Admit: 2013-10-22 | Discharge: 2013-10-22 | Disposition: A | Discharge status: Home / Self Care | Payer: Medicare Other | Source: Home / Self Care | Attending: Family Medicine | Admitting: Family Medicine

## 2013-10-22 DIAGNOSIS — I951 Orthostatic hypotension: Secondary | ICD-10-CM

## 2013-10-22 DIAGNOSIS — R42 Dizziness and giddiness: Secondary | ICD-10-CM

## 2013-10-22 MED ORDER — MECLIZINE HCL 25 MG PO TABS: 25.0000 mg | ORAL_TABLET | Freq: Four times a day (QID) | ORAL | Status: DC | PRN

## 2013-10-22 NOTE — Discharge Instructions (Signed)
Milli's vertigo is likely from several different things. Her eye surgery altering her vision is probably the main culprit. Her low blood pressure also is likely contributing.  Please give her the meclizine for vertigo and nausea Please stop her metoprolol Please make sure to keep your appointment with the eye doctor on Tuesday If she develops any fevers, confusion, weakness,  then go to the emergency room.

## 2013-10-22 NOTE — ED Notes (Signed)
Pt c/o dizziness x 2 weeks??? Recent cataract eye surgery Seen on 7/22 at [facility] ER for similar sx Sx also include nausea Dizziness increases w/activity

## 2013-10-22 NOTE — ED Provider Notes (Addendum)
CSN: [id number]     Arrival date & time 10/22/13  1606 History   None    Chief Complaint  Patient presents with  . Dizziness   (Consider location/radiation/quality/duration/timing/severity/associated sxs/prior Treatment) HPI  Vertigo: started 10-13 days ago. Started the day after cataract surgery. Described as occasional room spinning. Worse w/ getting up from lying position. Worse w/ quick head movements. Workup in ED was fairly benign. MRI.MRA w/o stroke or other acute process. No worse than on exam in [facility]. Son gave left over meclizine pills w/ some benefit.    Past Medical History  Diagnosis Date  . History of hiatal hernia   . Mitral valve prolapse   . Plantar fasciitis   . Hay fever   . GERD (gastroesophageal reflux disease)   . Palpitations   . Hypertension   . Neuromuscular disorder   . Deafness     "very HOH; even w/hearing aides we have to communicate via writing things down for her to read" (08/25/2012)  . H/O hiatal hernia   . Arthritis     "a little all over" (08/25/2012)  . Lung mass     Hx: of  . Anxiety   . Hemorrhoids    Past Surgical History  Procedure Laterality Date  . Foot surgery Bilateral ~ 2000    "pinched nerve on feet" (08/25/2012)  . Vesicovaginal fistula closure w/ tah    . Abdominal hysterectomy  1998  . Tonsillectomy and adenoidectomy  1940's  . Cesarean section  1952  . Knee arthroscopy w/ meniscectomy Left 07/15/2010    partial medial and lateral meniscectomies.Archie Endo 07/15/2010 (08/25/2012)  . Hip arthroplasty Right 08/26/2012    Procedure: ARTHROPLASTY BIPOLAR HIP;  Surgeon: Mauri Pole, MD;  Location: [facility];  Service: Orthopedics;  Laterality: Right;  . Open reduction internal fixation (orif) distal radial fracture Right 09/11/2012    Procedure: OPEN REDUCTION INTERNAL FIXATION (ORIF) RIGHT DISTAL RADIAL FRACTURE/ULNAR POSSIBLE BONE GRAFTING;  Surgeon: Linna Hoff, MD;  Location: [facility];  Service: Orthopedics;  Laterality: Right;    Family History  Problem Relation Age of Onset  . Colon cancer Mother   . Colon cancer Sister   . Cancer Sister     Breast  . Colon cancer Brother   . Heart disease Brother     Heart Attack  . Cancer Father     Leukemia   History  Substance Use Topics  . Smoking status: Never Smoker   . Smokeless tobacco: Never Used  . Alcohol Use: No   OB History   Grav Para Term Preterm Abortions TAB SAB Ect Mult Living   2 2 2       2      Review of Systems Per HPI with all other pertinent systems negative.   Allergies  Aleve  Home Medications   Prior to Admission medications   Medication Sig Start Date End Date Taking? Authorizing Provider  acetaminophen (TYLENOL) 325 MG tablet Take 325-650 mg by mouth every 4 (four) hours as needed for mild pain.    Historical Provider, MD  Calcium Glycerophosphate (PRELIEF PO) Take 65 mg by mouth daily as needed (as needed for indigestion).     Historical Provider, MD  cyclobenzaprine (FLEXERIL) 5 MG tablet Take 1 tablet (5 mg total) by mouth 3 (three) times daily as needed for muscle spasms. 10/03/13   Tiffany L Reed, DO  DUREZOL 0.05 % EMUL Apply 1 drop to eye daily. For 2 weeks, then d/c 10/11/13 10/25/13  Historical Provider, MD  furosemide (LASIX) 40 MG tablet Take 40 mg by mouth daily.    Historical Provider, MD  gabapentin (NEURONTIN) 600 MG tablet Take 1 tablet (600 mg total) by mouth 3 (three) times daily. 07/18/13   Tiffany L Reed, DO  ILEVRO 0.3 % SUSP Apply 1 drop to eye daily. For 2 weeks, then d/c 10/11/13 10/25/13  Historical Provider, MD  KLOR-CON M20 20 MEQ tablet Take 20 mEq by mouth daily.  01/11/13   Historical Provider, MD  losartan (COZAAR) 50 MG tablet Take 50 mg by mouth daily.    Historical Provider, MD  meclizine (ANTIVERT) 25 MG tablet Take 1 tablet (25 mg total) by mouth every 6 (six) hours as needed for dizziness or nausea. 10/22/13   Waldemar Dickens, MD  polyethylene glycol [facility] / Floria Raveling) packet Take 17 g by mouth daily  as needed for mild constipation (constipation).     Historical Provider, MD  sennosides-docusate sodium (SENOKOT-S) 8.6-50 MG tablet Take 1 tablet by mouth 2 (two) times daily.    Historical Provider, MD  sertraline (ZOLOFT) 50 MG tablet Take 50 mg by mouth daily. For anxiety    Historical Provider, MD   BP 139/73  Pulse 69  Temp(Src) 98.2 F (36.8 C) (Oral)  SpO2 99% Physical Exam  Constitutional: She is oriented to person, place, and time. She appears well-developed and well-nourished. No distress.  HENT:  Head: Normocephalic and atraumatic.  Eyes: EOM are normal. Pupils are equal, round, and reactive to light.  Neck: Normal range of motion.  Cardiovascular: Normal rate, regular rhythm, normal heart sounds and intact distal pulses.   Pulmonary/Chest: Effort normal. No respiratory distress.  Abdominal: Soft. She exhibits no distension.  Musculoskeletal: Normal range of motion. She exhibits no edema and no tenderness.  Neurological: She is alert and oriented to person, place, and time. No cranial nerve deficit.  EOMI, PERRL. No disconjugate gaze.   Skin: Skin is warm and dry. No rash noted. She is not diaphoretic. No erythema. No pallor.  Psychiatric: She has a normal mood and affect. Her behavior is normal. Judgment and thought content normal.    ED Course  Procedures (including critical care time) Labs Review Labs Reviewed - No data to display  Imaging Review No results found.   MDM   1. Vertigo   2. Orthostasis    Pt w/ likely baseline dementia.   Vertigo: onset after cataract surgery concerning that this is primarily occular in nature. No CN deficits. Cover/cross cover test nml. Orthostatics nml despite describing symptoms of this when going from lying to standing.  - Rx vfor meclizine given - f/u opthalmology on Tuesday - Stop Metop as BP below goal and low nml HR. Continue losartan - warning signs of infection, stroke, szr, other intracranial process discussed at  length.  Precautions given and all questions answered   Linna Darner, MD Family Medicine 10/22/2013, 5:26 PM    Fully reviewed ED notes and lab results.   Waldemar Dickens, MD 10/22/13 1726  Waldemar Dickens, MD 10/22/13 [phone]

## 2013-10-25 ENCOUNTER — Ambulatory Visit: Payer: Medicare Other | Admitting: Internal Medicine

## 2013-10-28 ENCOUNTER — Emergency Department (INDEPENDENT_AMBULATORY_CARE_PROVIDER_SITE_OTHER)
Admission: EM | Admit: 2013-10-28 | Discharge: 2013-10-28 | Disposition: A | Discharge status: Home / Self Care | Payer: Medicare Other | Source: Home / Self Care

## 2013-10-28 ENCOUNTER — Encounter (HOSPITAL_COMMUNITY): Payer: Self-pay | Admitting: Emergency Medicine

## 2013-10-28 DIAGNOSIS — R42 Dizziness and giddiness: Secondary | ICD-10-CM

## 2013-10-28 DIAGNOSIS — N390 Urinary tract infection, site not specified: Secondary | ICD-10-CM

## 2013-10-28 DIAGNOSIS — I951 Orthostatic hypotension: Secondary | ICD-10-CM

## 2013-10-28 LAB — POCT URINALYSIS DIP (DEVICE)
Bilirubin Urine: NEGATIVE
Glucose, UA: NEGATIVE mg/dL
HGB URINE DIPSTICK: NEGATIVE
Ketones, ur: NEGATIVE mg/dL
Nitrite: NEGATIVE
PROTEIN: NEGATIVE mg/dL
Specific Gravity, Urine: 1.015 (ref 1.005–1.030)
UROBILINOGEN UA: 0.2 mg/dL (ref 0.0–1.0)
pH: 7.5 (ref 5.0–8.0)

## 2013-10-28 MED ORDER — CEPHALEXIN 500 MG PO CAPS: 500.0000 mg | ORAL_CAPSULE | Freq: Two times a day (BID) | ORAL | Status: DC

## 2013-10-28 NOTE — ED Notes (Signed)
Pt c/o poss UTI sx onset today C/o urinary freq and urgency Denies dysuria Alert w/no signs of acute distress.

## 2013-10-28 NOTE — ED Provider Notes (Signed)
[name] is a 80 y.o. female who presents to Urgent Care today for urinary tract infection. Patient has a few days history of urinary frequency and urgency with mild pelvic pain. She denies any fevers or chills nausea vomiting or diarrhea. She has not tried any medications. Her current symptoms are consistent with previous episodes of urinary tract infection.   Past Medical History  Diagnosis Date  . History of hiatal hernia   . Mitral valve prolapse   . Plantar fasciitis   . Hay fever   . GERD (gastroesophageal reflux disease)   . Palpitations   . Hypertension   . Neuromuscular disorder   . Deafness     "very HOH; even w/hearing aides we have to communicate via writing things down for her to read" (08/25/2012)  . H/O hiatal hernia   . Arthritis     "a little all over" (08/25/2012)  . Lung mass     Hx: of  . Anxiety   . Hemorrhoids    History  Substance Use Topics  . Smoking status: Never Smoker   . Smokeless tobacco: Never Used  . Alcohol Use: No   ROS as above Medications: No current facility-administered medications for this encounter.   Current Outpatient Prescriptions  Medication Sig Dispense Refill  . acetaminophen (TYLENOL) 325 MG tablet Take 325-650 mg by mouth every 4 (four) hours as needed for mild pain.      . Calcium Glycerophosphate (PRELIEF PO) Take 65 mg by mouth daily as needed (as needed for indigestion).       . cephALEXin (KEFLEX) 500 MG capsule Take 1 capsule (500 mg total) by mouth 2 (two) times daily.  14 capsule  0  . cyclobenzaprine (FLEXERIL) 5 MG tablet Take 1 tablet (5 mg total) by mouth 3 (three) times daily as needed for muscle spasms.  30 tablet  2  . furosemide (LASIX) 40 MG tablet Take 40 mg by mouth daily.      Marland Kitchen gabapentin (NEURONTIN) 600 MG tablet Take 1 tablet (600 mg total) by mouth 3 (three) times daily.  90 tablet  3  . KLOR-CON M20 20 MEQ tablet Take 20 mEq by mouth daily.       Marland Kitchen losartan (COZAAR) 50 MG tablet Take 50 mg by  mouth daily.      . meclizine (ANTIVERT) 25 MG tablet Take 1 tablet (25 mg total) by mouth every 6 (six) hours as needed for dizziness or nausea.  30 tablet  0  . polyethylene glycol (MIRALAX / GLYCOLAX) packet Take 17 g by mouth daily as needed for mild constipation (constipation).       . sennosides-docusate sodium (SENOKOT-S) 8.6-50 MG tablet Take 1 tablet by mouth 2 (two) times daily.      . sertraline (ZOLOFT) 50 MG tablet Take 50 mg by mouth daily. For anxiety        Exam:  BP 153/79  Pulse 78  Temp(Src) 98.8 F (37.1 C) (Oral)  Resp 18  Ht 5\' 7"  (1.702 m)  Wt 170 lb (77.111 kg)  BMI 26.62 kg/m2  SpO2 96% Gen: Well NAD HEENT: EOMI,  MMM Lungs: Normal work of breathing. CTABL Heart: RRR no MRG Abd: NABS, Soft. Nondistended, Nontender no CV angle tenderness to percussion Exts: Brisk capillary refill, warm and well perfused.   Results for orders placed during the hospital encounter of 10/28/13 (from the past 24 hour(s))  POCT URINALYSIS DIP (DEVICE)     Status: Abnormal   Collection  Time    10/28/13  5:54 PM      Result Value Ref Range   Glucose, UA NEGATIVE  NEGATIVE mg/dL   Bilirubin Urine NEGATIVE  NEGATIVE   Ketones, ur NEGATIVE  NEGATIVE mg/dL   Specific Gravity, Urine 1.015  1.005 - 1.030   Hgb urine dipstick NEGATIVE  NEGATIVE   pH 7.5  5.0 - 8.0   Protein, ur NEGATIVE  NEGATIVE mg/dL   Urobilinogen, UA 0.2  0.0 - 1.0 mg/dL   Nitrite NEGATIVE  NEGATIVE   Leukocytes, UA SMALL (*) NEGATIVE   No results found.  Assessment and Plan: 80 y.o. female with urinary tract infection. Urine culture pending. Treatment with Keflex. Followup with primary care provider.  Discussed warning signs or symptoms. Please see discharge instructions. Patient expresses understanding.   This note was created using Systems analyst. Any transcription errors are unintended.    Gregor Hams, MD 10/28/13 [phone]

## 2013-10-28 NOTE — Discharge Instructions (Signed)
Thank you for coming in today. If your belly pain worsens, or you have high fever, bad vomiting, blood in your stool or black tarry stool go to the Emergency Room.   Urinary Tract Infection Urinary tract infections (UTIs) can develop anywhere along your urinary tract. Your urinary tract is your body's drainage system for removing wastes and extra water. Your urinary tract includes two kidneys, two ureters, a bladder, and a urethra. Your kidneys are a pair of bean-shaped organs. Each kidney is about the size of your fist. They are located below your ribs, one on each side of your spine. CAUSES Infections are caused by microbes, which are microscopic organisms, including fungi, viruses, and bacteria. These organisms are so small that they can only be seen through a microscope. Bacteria are the microbes that most commonly cause UTIs. SYMPTOMS  Symptoms of UTIs may vary by age and gender of the patient and by the location of the infection. Symptoms in young women typically include a frequent and intense urge to urinate and a painful, burning feeling in the bladder or urethra during urination. Older women and men are more likely to be tired, shaky, and weak and have muscle aches and abdominal pain. A fever may mean the infection is in your kidneys. Other symptoms of a kidney infection include pain in your back or sides below the ribs, nausea, and vomiting. DIAGNOSIS To diagnose a UTI, your caregiver will ask you about your symptoms. Your caregiver also will ask to provide a urine sample. The urine sample will be tested for bacteria and white blood cells. White blood cells are made by your body to help fight infection. TREATMENT  Typically, UTIs can be treated with medication. Because most UTIs are caused by a bacterial infection, they usually can be treated with the use of antibiotics. The choice of antibiotic and length of treatment depend on your symptoms and the type of bacteria causing your  infection. HOME CARE INSTRUCTIONS  If you were prescribed antibiotics, take them exactly as your caregiver instructs you. Finish the medication even if you feel better after you have only taken some of the medication.  Drink enough water and fluids to keep your urine clear or pale yellow.  Avoid caffeine, tea, and carbonated beverages. They tend to irritate your bladder.  Empty your bladder often. Avoid holding urine for long periods of time.  Empty your bladder before and after sexual intercourse.  After a bowel movement, women should cleanse from front to back. Use each tissue only once. SEEK MEDICAL CARE IF:   You have back pain.  You develop a fever.  Your symptoms do not begin to resolve within 3 days. SEEK IMMEDIATE MEDICAL CARE IF:   You have severe back pain or lower abdominal pain.  You develop chills.  You have nausea or vomiting.  You have continued burning or discomfort with urination. MAKE SURE YOU:   Understand these instructions.  Will watch your condition.  Will get help right away if you are not doing well or get worse. Document Released: 12/25/2004 Document Revised: 09/16/2011 Document Reviewed: 04/25/2011 Exit[employer] Patient Information 2015 [employer]. This information is not intended to replace advice given to you by your health care provider. Make sure you discuss any questions you have with your health care provider.  

## 2013-10-30 LAB — URINE CULTURE
Colony Count: 30000
SPECIAL REQUESTS: NORMAL

## 2013-11-08 ENCOUNTER — Emergency Department (INDEPENDENT_AMBULATORY_CARE_PROVIDER_SITE_OTHER)
Admission: EM | Admit: 2013-11-08 | Discharge: 2013-11-08 | Disposition: A | Discharge status: Home / Self Care | Payer: Medicare Other | Source: Home / Self Care | Attending: Family Medicine | Admitting: Family Medicine

## 2013-11-08 ENCOUNTER — Encounter (HOSPITAL_COMMUNITY): Payer: Self-pay | Admitting: Emergency Medicine

## 2013-11-08 ENCOUNTER — Emergency Department (INDEPENDENT_AMBULATORY_CARE_PROVIDER_SITE_OTHER): Payer: Medicare Other

## 2013-11-08 ENCOUNTER — Emergency Department (HOSPITAL_COMMUNITY)
Admission: EM | Admit: 2013-11-08 | Discharge: 2013-11-08 | Discharge status: Absent without leave | Payer: Self-pay | Source: Home / Self Care

## 2013-11-08 DIAGNOSIS — S62309A Unspecified fracture of unspecified metacarpal bone, initial encounter for closed fracture: Secondary | ICD-10-CM

## 2013-11-08 MED ORDER — TETANUS-DIPHTH-ACELL PERTUSSIS 5-2.5-18.5 LF-MCG/0.5 IM SUSP
INTRAMUSCULAR | Status: AC
  Filled 2013-11-08: qty 0.5

## 2013-11-08 MED ORDER — TETANUS-DIPHTH-ACELL PERTUSSIS 5-2.5-18.5 LF-MCG/0.5 IM SUSP
0.5000 mL | Freq: Once | INTRAMUSCULAR | Status: AC
  Administered 2013-11-08: 0.5 mL via INTRAMUSCULAR

## 2013-11-08 MED ORDER — CEPHALEXIN 500 MG PO CAPS: 500.0000 mg | ORAL_CAPSULE | Freq: Four times a day (QID) | ORAL | Status: DC

## 2013-11-08 NOTE — Progress Notes (Signed)
Orthopedic Tech Progress Note Patient Details:  [name] [birth year] [id number] Ulna gutter applied; arm sling applied Ortho Devices Type of Ortho Device: Arm sling;Ace wrap;Ulna gutter splint Ortho Device/Splint Location: LUE Ortho Device/Splint Interventions: Application   Asia R Thompson 11/08/2013, 4:25 PM

## 2013-11-08 NOTE — ED Notes (Addendum)
Pt  Slipped  And  Felled  Today   Injuring  Her  l  Hand   Tenderness present   She  Has  A  Skin  Tear  Present     Bleeding  Has  Subsided        Skin   Has  An  Avulsion         Pt is  Awake  And  Alert  And  Hard  Of  Hearing           Son  At  Bedside  Pt  Has  A  Hematoma  Of  Her  r  Arm             -  Pt is  Ambulatory  With  Some  help

## 2013-11-08 NOTE — ED Provider Notes (Signed)
CSN: [id number]     Arrival date & time 11/08/13  1427 History   First MD Initiated Contact with Patient 11/08/13 1439     Chief Complaint  Patient presents with  . Abrasion   (Consider location/radiation/quality/duration/timing/severity/associated sxs/prior Treatment) Patient is a 80 y.o. female presenting with hand injury. The history is provided by the patient.  Hand Injury Location:  Hand Injury: yes   Mechanism of injury: fall   Mechanism of injury comment:  Slipped and fell in kitchen with lac/ skin tear to dorsum of hand. Fall:    Fall occurred:  Walking   Impact surface:  Hard floor   Point of impact:  Hands   Entrapped after fall: no   Hand location:  Dorsum of L hand Pain details:    Severity:  Mild   Onset quality:  Sudden   Progression:  Unchanged Chronicity:  New Dislocation: no     Past Medical History  Diagnosis Date  . History of hiatal hernia   . Mitral valve prolapse   . Plantar fasciitis   . Hay fever   . GERD (gastroesophageal reflux disease)   . Palpitations   . Hypertension   . Neuromuscular disorder   . Deafness     "very HOH; even w/hearing aides we have to communicate via writing things down for her to read" (08/25/2012)  . H/O hiatal hernia   . Arthritis     "a little all over" (08/25/2012)  . Lung mass     Hx: of  . Anxiety   . Hemorrhoids    Past Surgical History  Procedure Laterality Date  . Foot surgery Bilateral ~ 2000    "pinched nerve on feet" (08/25/2012)  . Vesicovaginal fistula closure w/ tah    . Abdominal hysterectomy  1998  . Tonsillectomy and adenoidectomy  1940's  . Cesarean section  1952  . Knee arthroscopy w/ meniscectomy Left 07/15/2010    partial medial and lateral meniscectomies.Archie Endo 07/15/2010 (08/25/2012)  . Hip arthroplasty Right 08/26/2012    Procedure: ARTHROPLASTY BIPOLAR HIP;  Surgeon: Mauri Pole, MD;  Location: [place];  Service: Orthopedics;  Laterality: Right;  . Open reduction internal fixation (orif)  distal radial fracture Right 09/11/2012    Procedure: OPEN REDUCTION INTERNAL FIXATION (ORIF) RIGHT DISTAL RADIAL FRACTURE/ULNAR POSSIBLE BONE GRAFTING;  Surgeon: Linna Hoff, MD;  Location: [place];  Service: Orthopedics;  Laterality: Right;   Family History  Problem Relation Age of Onset  . Colon cancer Mother   . Colon cancer Sister   . Cancer Sister     Breast  . Colon cancer Brother   . Heart disease Brother     Heart Attack  . Cancer Father     Leukemia   History  Substance Use Topics  . Smoking status: Never Smoker   . Smokeless tobacco: Never Used  . Alcohol Use: No   OB History   Grav Para Term Preterm Abortions TAB SAB Ect Mult Living   2 2 2       2      Review of Systems  Constitutional: Negative.   Musculoskeletal: Negative for joint swelling.  Skin: Positive for wound.    Allergies  Aleve  Home Medications   Prior to Admission medications   Medication Sig Start Date End Date Taking? Authorizing Provider  acetaminophen (TYLENOL) 325 MG tablet Take 325-650 mg by mouth every 4 (four) hours as needed for mild pain.    Historical Provider, MD  Calcium Glycerophosphate (  PRELIEF PO) Take 65 mg by mouth daily as needed (as needed for indigestion).     Historical Provider, MD  cephALEXin (KEFLEX) 500 MG capsule Take 1 capsule (500 mg total) by mouth 2 (two) times daily. 10/28/13   Gregor Hams, MD  cephALEXin (KEFLEX) 500 MG capsule Take 1 capsule (500 mg total) by mouth 4 (four) times daily. Take all of medicine and drink lots of fluids 11/08/13   Billy Fischer, MD  cyclobenzaprine (FLEXERIL) 5 MG tablet Take 1 tablet (5 mg total) by mouth 3 (three) times daily as needed for muscle spasms. 10/03/13   Tiffany L Reed, DO  furosemide (LASIX) 40 MG tablet Take 40 mg by mouth daily.    Historical Provider, MD  gabapentin (NEURONTIN) 600 MG tablet Take 1 tablet (600 mg total) by mouth 3 (three) times daily. 07/18/13   Tiffany L Reed, DO  KLOR-CON M20 20 MEQ tablet Take 20 mEq  by mouth daily.  01/11/13   Historical Provider, MD  losartan (COZAAR) 50 MG tablet Take 50 mg by mouth daily.    Historical Provider, MD  meclizine (ANTIVERT) 25 MG tablet Take 1 tablet (25 mg total) by mouth every 6 (six) hours as needed for dizziness or nausea. 10/22/13   Waldemar Dickens, MD  polyethylene glycol [facility] Floria Raveling) packet Take 17 g by mouth daily as needed for mild constipation (constipation).     Historical Provider, MD  sennosides-docusate sodium (SENOKOT-S) 8.6-50 MG tablet Take 1 tablet by mouth 2 (two) times daily.    Historical Provider, MD  sertraline (ZOLOFT) 50 MG tablet Take 50 mg by mouth daily. For anxiety    Historical Provider, MD   BP 140/74  Pulse 78  Temp(Src) 98.6 F (37 C) (Oral)  Resp 14  SpO2 98% Physical Exam  Nursing note and vitals reviewed. Constitutional: She is oriented to person, place, and time. She appears well-developed and well-nourished.  Neurological: She is alert and oriented to person, place, and time.  Skin: Skin is warm and dry.  Superficial skin tear to dorsum of left hand, no bleeding. Intact rom.    ED Course  Debridement Date/Time: 11/08/2013 2:56 PM Performed by: Billy Fischer Authorized by: Ihor Gully D Consent: Verbal consent obtained. Risks and benefits: risks, benefits and alternatives were discussed Consent given by: patient Preparation: Patient was prepped and draped in the usual sterile fashion. Local anesthesia used: no Patient sedated: no Patient tolerance: Patient tolerated the procedure well with no immediate complications.   (including critical care time) Labs Review Labs Reviewed - No data to display  Imaging Review Dg Hand Complete Left  11/08/2013   CLINICAL DATA:  ABRASION  EXAM: LEFT HAND - COMPLETE 3+ VIEW  COMPARISON:  None.  FINDINGS: Soft tissue abrasion/injury present at the ulnar aspect of the left hand. There is an acute oblique fracture or through the proximal - mid shaft of the left  fifth metacarpal with slight radial displacement. The left fifth carpometacarpal and MCP joints remain approximated. No other fracture.  Diffuse osteopenia noted.  IMPRESSION: 1. Acute oblique fracture through the proximal - mid shaft of the left fifth metacarpal with radial displacement. 2. Soft tissue abrasion/laceration at the ulnar aspect of the left hand. No retained foreign body.   Electronically Signed   By: Jeannine Boga M.D.   On: 11/08/2013 15:11     MDM   1. Metacarpal bone fracture, closed, initial encounter    Discussed with dr handy ,  referred to dr Grandville Silos, plans as noted, skin flap debrided, hibiclens soaked, xeroform dsd.applied.    Billy Fischer, MD 11/08/13 [phone]

## 2013-11-08 NOTE — Discharge Instructions (Signed)
Call to see dr Grandville Silos on thurs in office, take the antibiotic and tylenol for pain if needed.

## 2013-11-15 ENCOUNTER — Other Ambulatory Visit: Payer: Self-pay | Admitting: Internal Medicine

## 2013-11-17 ENCOUNTER — Other Ambulatory Visit: Payer: Self-pay | Admitting: Internal Medicine

## 2013-11-17 NOTE — Progress Notes (Signed)
Patient ID: [name], female   DOB: Sep 20, 1915, 80 y.o.   MRN: [id number] Pt was actually seen by my colleague, Dr. Bubba Camp in June of 2014.  Therefore, this needs to be an established code.  Due to a full assessment being performed and a long visit, corrected code should be 99215.

## 2013-11-17 NOTE — Addendum Note (Signed)
Addended by: Gayland Curry on: 11/17/2013 08:20 AM   Modules accepted: Level of Service

## 2013-11-30 ENCOUNTER — Other Ambulatory Visit: Payer: Self-pay | Admitting: Internal Medicine

## 2013-12-13 ENCOUNTER — Other Ambulatory Visit: Payer: Self-pay | Admitting: *Deleted

## 2013-12-13 ENCOUNTER — Emergency Department (HOSPITAL_COMMUNITY)
Admission: EM | Admit: 2013-12-13 | Discharge: 2013-12-13 | Discharge status: Absent without leave | Payer: Self-pay | Source: Home / Self Care

## 2013-12-13 ENCOUNTER — Emergency Department (INDEPENDENT_AMBULATORY_CARE_PROVIDER_SITE_OTHER)
Admission: EM | Admit: 2013-12-13 | Discharge: 2013-12-13 | Disposition: A | Discharge status: Home / Self Care | Payer: Medicare Other | Source: Home / Self Care | Attending: Family Medicine | Admitting: Family Medicine

## 2013-12-13 ENCOUNTER — Encounter (HOSPITAL_COMMUNITY): Payer: Self-pay | Admitting: Emergency Medicine

## 2013-12-13 DIAGNOSIS — R918 Other nonspecific abnormal finding of lung field: Secondary | ICD-10-CM

## 2013-12-13 DIAGNOSIS — N390 Urinary tract infection, site not specified: Secondary | ICD-10-CM

## 2013-12-13 LAB — POCT URINALYSIS DIP (DEVICE)
Bilirubin Urine: NEGATIVE
GLUCOSE, UA: NEGATIVE mg/dL
KETONES UR: NEGATIVE mg/dL
Nitrite: NEGATIVE
Protein, ur: 100 mg/dL — AB
SPECIFIC GRAVITY, URINE: 1.015 (ref 1.005–1.030)
Urobilinogen, UA: 0.2 mg/dL (ref 0.0–1.0)
pH: 8.5 — ABNORMAL HIGH (ref 5.0–8.0)

## 2013-12-13 MED ORDER — CEFUROXIME AXETIL 250 MG PO TABS: 250.0000 mg | ORAL_TABLET | Freq: Two times a day (BID) | ORAL | Status: DC

## 2013-12-13 NOTE — ED Provider Notes (Signed)
CSN: [id number]     Arrival date & time 12/13/13  1218 History   None    Chief Complaint  Patient presents with  . Urinary Tract Infection   (Consider location/radiation/quality/duration/timing/severity/associated sxs/prior Treatment) HPI Comments: 80 year old female presents complaining of possible urinary tract infection. For 2 days she has burning with urination. She has treated this at home with drinking a mix of baking soda and water, she says this has been very helpful. She denies flank pain or abdominal pain, fever, chills, NVD.  Patient is a 80 y.o. female presenting with urinary tract infection.  Urinary Tract Infection    Past Medical History  Diagnosis Date  . History of hiatal hernia   . Mitral valve prolapse   . Plantar fasciitis   . Hay fever   . GERD (gastroesophageal reflux disease)   . Palpitations   . Hypertension   . Neuromuscular disorder   . Deafness     "very HOH; even w/hearing aides we have to communicate via writing things down for her to read" (08/25/2012)  . H/O hiatal hernia   . Arthritis     "a little all over" (08/25/2012)  . Lung mass     Hx: of  . Anxiety   . Hemorrhoids    Past Surgical History  Procedure Laterality Date  . Foot surgery Bilateral ~ 2000    "pinched nerve on feet" (08/25/2012)  . Vesicovaginal fistula closure w/ tah    . Abdominal hysterectomy  1998  . Tonsillectomy and adenoidectomy  1940's  . Cesarean section  1952  . Knee arthroscopy w/ meniscectomy Left 07/15/2010    partial medial and lateral meniscectomies.Archie Endo 07/15/2010 (08/25/2012)  . Hip arthroplasty Right 08/26/2012    Procedure: ARTHROPLASTY BIPOLAR HIP;  Surgeon: Mauri Pole, MD;  Location: [place];  Service: Orthopedics;  Laterality: Right;  . Open reduction internal fixation (orif) distal radial fracture Right 09/11/2012    Procedure: OPEN REDUCTION INTERNAL FIXATION (ORIF) RIGHT DISTAL RADIAL FRACTURE/ULNAR POSSIBLE BONE GRAFTING;  Surgeon: Linna Hoff, MD;   Location: [place];  Service: Orthopedics;  Laterality: Right;   Family History  Problem Relation Age of Onset  . Colon cancer Mother   . Colon cancer Sister   . Cancer Sister     Breast  . Colon cancer Brother   . Heart disease Brother     Heart Attack  . Cancer Father     Leukemia   History  Substance Use Topics  . Smoking status: Never Smoker   . Smokeless tobacco: Never Used  . Alcohol Use: No   OB History   Grav Para Term Preterm Abortions TAB SAB Ect Mult Living   2 2 2       2      Review of Systems  Genitourinary: Positive for dysuria.  All other systems reviewed and are negative.   Allergies  Aleve  Home Medications   Prior to Admission medications   Medication Sig Start Date End Date Taking? Authorizing Provider  acetaminophen (TYLENOL) 325 MG tablet Take 325-650 mg by mouth every 4 (four) hours as needed for mild pain.    Historical Provider, MD  Calcium Glycerophosphate (PRELIEF PO) Take 65 mg by mouth daily as needed (as needed for indigestion).     Historical Provider, MD  cefUROXime (CEFTIN) 250 MG tablet Take 1 tablet (250 mg total) by mouth 2 (two) times daily with a meal. 12/13/13   Liam Graham, PA-C  cephALEXin (KEFLEX) 500 MG capsule  Take 1 capsule (500 mg total) by mouth 2 (two) times daily. 10/28/13   Gregor Hams, MD  cephALEXin (KEFLEX) 500 MG capsule Take 1 capsule (500 mg total) by mouth 4 (four) times daily. Take all of medicine and drink lots of fluids 11/08/13   Billy Fischer, MD  cyclobenzaprine (FLEXERIL) 5 MG tablet Take 1 tablet (5 mg total) by mouth 3 (three) times daily as needed for muscle spasms. 10/03/13   Tiffany L Reed, DO  furosemide (LASIX) 40 MG tablet Take 40 mg by mouth daily.    Historical Provider, MD  gabapentin (NEURONTIN) 600 MG tablet TAKE 1 TABLET (600 MG TOTAL) BY MOUTH 3 (THREE) TIMES DAILY. 11/15/13   Tiffany L Reed, DO  KLOR-CON M20 20 MEQ tablet Take 20 mEq by mouth daily.  01/11/13   Historical Provider, MD  losartan  (COZAAR) 50 MG tablet Take 50 mg by mouth daily.    Historical Provider, MD  losartan (COZAAR) 50 MG tablet TAKE 1 TABLET (50 MG TOTAL) BY MOUTH DAILY. 12/01/13   Tiffany L Reed, DO  meclizine (ANTIVERT) 25 MG tablet Take 1 tablet (25 mg total) by mouth every 6 (six) hours as needed for dizziness or nausea. 10/22/13   Waldemar Dickens, MD  NASONEX 50 MCG/ACT nasal spray PLACE 2 SPRAYS INTO THE NOSE DAILY. 12/01/13   Tiffany L Reed, DO  polyethylene glycol (MIRALAX / GLYCOLAX) packet Take 17 g by mouth daily as needed for mild constipation (constipation).     Historical Provider, MD  sennosides-docusate sodium (SENOKOT-S) 8.6-50 MG tablet Take 1 tablet by mouth 2 (two) times daily.    Historical Provider, MD  sertraline (ZOLOFT) 50 MG tablet Take 50 mg by mouth daily. For anxiety    Historical Provider, MD   BP 159/89  Pulse 68  Temp(Src) 98.5 F (36.9 C) (Oral)  Resp 16  SpO2 98% Physical Exam  Nursing note and vitals reviewed. Constitutional: She is oriented to person, place, and time. Vital signs are normal. She appears well-developed and well-nourished. No distress.  HENT:  Head: Normocephalic and atraumatic.  Pulmonary/Chest: Effort normal. No respiratory distress.  Abdominal: There is no tenderness. There is no CVA tenderness.  Neurological: She is alert and oriented to person, place, and time. She has normal strength. Coordination normal.  Skin: Skin is warm and dry. No rash noted. She is not diaphoretic.  Psychiatric: She has a normal mood and affect. Judgment normal.    ED Course  Procedures (including critical care time) Labs Review Labs Reviewed  POCT URINALYSIS DIP (DEVICE) - Abnormal; Notable for the following:    Hgb urine dipstick TRACE (*)    pH 8.5 (*)    Protein, ur 100 (*)    Leukocytes, UA SMALL (*)    All other components within normal limits  URINE CULTURE    Imaging Review No results found.   MDM   1. UTI (lower urinary tract infection)    Urine culture  sent. Treat with Ceftin. Followup when necessary   Meds ordered this encounter  Medications  . cefUROXime (CEFTIN) 250 MG tablet    Sig: Take 1 tablet (250 mg total) by mouth 2 (two) times daily with a meal.    Dispense:  14 tablet    Refill:  0    Order Specific Question:  Supervising Provider    Answer:  Ihor Gully D [[id number]]       Liam Graham, PA-C 12/13/13 1418

## 2013-12-13 NOTE — ED Notes (Signed)
Pt  Reports     Symptoms      Of        Burning  On  Urination          With  Symptoms           Since  yest

## 2013-12-13 NOTE — Discharge Instructions (Signed)

## 2013-12-14 LAB — URINE CULTURE

## 2013-12-16 NOTE — ED Provider Notes (Signed)
Medical screening examination/treatment/procedure(s) were performed by resident physician or non-physician practitioner and as supervising physician I was immediately available for consultation/collaboration.   Pauline Good MD.   Billy Fischer, MD 12/16/13 [phone]

## 2013-12-23 ENCOUNTER — Ambulatory Visit (INDEPENDENT_AMBULATORY_CARE_PROVIDER_SITE_OTHER): Payer: Medicare Other | Admitting: Internal Medicine

## 2013-12-23 ENCOUNTER — Encounter: Payer: Self-pay | Admitting: Internal Medicine

## 2013-12-23 VITALS — BP 138/78 | HR 77 | Temp 97.5°F | Resp 20 | Ht 67.0 in | Wt 176.0 lb

## 2013-12-23 DIAGNOSIS — R609 Edema, unspecified: Secondary | ICD-10-CM

## 2013-12-23 DIAGNOSIS — N949 Unspecified condition associated with female genital organs and menstrual cycle: Secondary | ICD-10-CM

## 2013-12-23 DIAGNOSIS — R911 Solitary pulmonary nodule: Secondary | ICD-10-CM

## 2013-12-23 DIAGNOSIS — M1710 Unilateral primary osteoarthritis, unspecified knee: Secondary | ICD-10-CM

## 2013-12-23 DIAGNOSIS — N9489 Other specified conditions associated with female genital organs and menstrual cycle: Secondary | ICD-10-CM

## 2013-12-23 DIAGNOSIS — G629 Polyneuropathy, unspecified: Secondary | ICD-10-CM

## 2013-12-23 DIAGNOSIS — R3589 Other polyuria: Secondary | ICD-10-CM

## 2013-12-23 DIAGNOSIS — G609 Hereditary and idiopathic neuropathy, unspecified: Secondary | ICD-10-CM

## 2013-12-23 DIAGNOSIS — R358 Other polyuria: Secondary | ICD-10-CM

## 2013-12-23 DIAGNOSIS — I10 Essential (primary) hypertension: Secondary | ICD-10-CM

## 2013-12-23 DIAGNOSIS — E785 Hyperlipidemia, unspecified: Secondary | ICD-10-CM

## 2013-12-23 DIAGNOSIS — M1712 Unilateral primary osteoarthritis, left knee: Secondary | ICD-10-CM

## 2013-12-23 MED ORDER — FUROSEMIDE 40 MG PO TABS: 20.0000 mg | ORAL_TABLET | Freq: Every day | ORAL | Status: DC

## 2013-12-23 MED ORDER — CELECOXIB 100 MG PO CAPS: 100.0000 mg | ORAL_CAPSULE | Freq: Every day | ORAL | Status: DC | PRN

## 2013-12-23 NOTE — Addendum Note (Signed)
Addended by: Logan Bores on: 12/23/2013 10:17 AM   Modules accepted: Orders

## 2013-12-23 NOTE — Progress Notes (Signed)
Patient ID: [name], female   DOB: Mar 07, 1916, 80 y.o.   MRN: [id number]   Location:  [facility] / [facility]  Code Status: DNR, has living will   Allergies  Allergen Reactions  . Aleve [Naproxen Sodium] Swelling    Legs and feet    Chief Complaint  Patient presents with  . Hospitalization Follow-up    UTI,     HPI: Patient is a 80 y.o. white female seen in the office today for hospital f/u.  Went due to concerns for UTI, but turned out she did not really have one.  Has been on 2 different cephalosporins.    Left knee OA for "right many years"--has spells and gets over it.  Asks about celebrex use.  Had surgery by Dr. Marcelino Scot.  Had fluid removed, then cortisone shot, then arthroscopy and repair of tear.  Got dizzy after eating something after the surgery.    Gabapentin helps her pain, but she thinks it makes her a little dizzy.    Thinks she pees too much.  Soaks through her clothes.  Has to rinse out her panties all of the time.  Tried a medication from urology.  Has not followed up.  Vaginal burning has resolved.  Using ky jelly to lubricate now.  Wants to try another bladder pill.  Does not sound like her lasix has been for CHF, only peripheral edema.    Refuses her flu shot.  Sees lung doctor next month and has repeat imaging due to lung nodule.  Review of Systems:  Review of Systems  Constitutional: Negative for fever.  Eyes: Negative for blurred vision.  Respiratory: Negative for cough and shortness of breath.   Cardiovascular: Negative for chest pain.  Gastrointestinal: Negative for constipation.  Genitourinary: Positive for dysuria, urgency and frequency.  Musculoskeletal: Positive for joint pain and myalgias. Negative for falls.  Skin: Negative for rash.  Neurological: Negative for dizziness, loss of consciousness and headaches.  Endo/Heme/Allergies: Bruises/bleeds easily.  Psychiatric/Behavioral: Positive for memory loss.  Negative for depression.     Past Medical History  Diagnosis Date  . History of hiatal hernia   . Mitral valve prolapse   . Plantar fasciitis   . Hay fever   . GERD (gastroesophageal reflux disease)   . Palpitations   . Hypertension   . Neuromuscular disorder   . Deafness     "very HOH; even w/hearing aides we have to communicate via writing things down for her to read" (08/25/2012)  . H/O hiatal hernia   . Arthritis     "a little all over" (08/25/2012)  . Lung mass     Hx: of  . Anxiety   . Hemorrhoids     Past Surgical History  Procedure Laterality Date  . Foot surgery Bilateral ~ 2000    "pinched nerve on feet" (08/25/2012)  . Vesicovaginal fistula closure w/ tah    . Abdominal hysterectomy  1998  . Tonsillectomy and adenoidectomy  1940's  . Cesarean section  1952  . Knee arthroscopy w/ meniscectomy Left 07/15/2010    partial medial and lateral meniscectomies.Archie Endo 07/15/2010 (08/25/2012)  . Hip arthroplasty Right 08/26/2012    Procedure: ARTHROPLASTY BIPOLAR HIP;  Surgeon: Mauri Pole, MD;  Location: [facility];  Service: Orthopedics;  Laterality: Right;  . Open reduction internal fixation (orif) distal radial fracture Right 09/11/2012    Procedure: OPEN REDUCTION INTERNAL FIXATION (ORIF) RIGHT DISTAL RADIAL FRACTURE/ULNAR POSSIBLE BONE GRAFTING;  Surgeon: Janelle Floor  Caralyn Guile, MD;  Location: [facility];  Service: Orthopedics;  Laterality: Right;    Social History:   reports that she has never smoked. She has never used smokeless tobacco. She reports that she does not drink alcohol or use illicit drugs.  Family History  Problem Relation Age of Onset  . Colon cancer Mother   . Colon cancer Sister   . Cancer Sister     Breast  . Colon cancer Brother   . Heart disease Brother     Heart Attack  . Cancer Father     Leukemia    Medications: Patient's Medications  New Prescriptions   No medications on file  Previous Medications   ACETAMINOPHEN (TYLENOL) 325 MG TABLET    Take  325-650 mg by mouth every 4 (four) hours as needed for mild pain.   CALCIUM GLYCEROPHOSPHATE (PRELIEF PO)    Take 65 mg by mouth daily as needed (as needed for indigestion).    CYCLOBENZAPRINE (FLEXERIL) 5 MG TABLET    Take 1 tablet (5 mg total) by mouth 3 (three) times daily as needed for muscle spasms.   FUROSEMIDE (LASIX) 40 MG TABLET    Take 40 mg by mouth daily.   GABAPENTIN (NEURONTIN) 600 MG TABLET    TAKE 1 TABLET (600 MG TOTAL) BY MOUTH 3 (THREE) TIMES DAILY.   KLOR-CON M20 20 MEQ TABLET    Take 20 mEq by mouth daily.    LOSARTAN (COZAAR) 50 MG TABLET    TAKE 1 TABLET (50 MG TOTAL) BY MOUTH DAILY.   MECLIZINE (ANTIVERT) 25 MG TABLET    Take 1 tablet (25 mg total) by mouth every 6 (six) hours as needed for dizziness or nausea.   METOPROLOL SUCCINATE (TOPROL-XL) 100 MG 24 HR TABLET       NASONEX 50 MCG/ACT NASAL SPRAY    PLACE 2 SPRAYS INTO THE NOSE DAILY.   POLYETHYLENE GLYCOL (MIRALAX / GLYCOLAX) PACKET    Take 17 g by mouth daily as needed for mild constipation (constipation).    SENNOSIDES-DOCUSATE SODIUM (SENOKOT-S) 8.6-50 MG TABLET    Take 1 tablet by mouth 2 (two) times daily.   SERTRALINE (ZOLOFT) 50 MG TABLET    Take 50 mg by mouth daily. For anxiety  Modified Medications   No medications on file  Discontinued Medications   CEFUROXIME (CEFTIN) 250 MG TABLET    Take 1 tablet (250 mg total) by mouth 2 (two) times daily with a meal.   CEPHALEXIN (KEFLEX) 500 MG CAPSULE    Take 1 capsule (500 mg total) by mouth 2 (two) times daily.   CEPHALEXIN (KEFLEX) 500 MG CAPSULE    Take 1 capsule (500 mg total) by mouth 4 (four) times daily. Take all of medicine and drink lots of fluids   LOSARTAN (COZAAR) 50 MG TABLET    Take 50 mg by mouth daily.     Physical Exam: Filed Vitals:   12/23/13 0828  BP: 138/78  Pulse: 77  Temp: 97.5 F (36.4 C)  TempSrc: Oral  Resp: 20  Height: 5\' 7"  (1.702 m)  Weight: 176 lb (79.833 kg)  SpO2: 96%  Physical Exam  Constitutional: She appears  well-developed and well-nourished. No distress.  HENT:  deaf  Cardiovascular: Normal rate, regular rhythm, normal heart sounds and intact distal pulses.   Pulmonary/Chest: Breath sounds normal. No respiratory distress.  Abdominal: Soft. Bowel sounds are normal. She exhibits no distension and no mass. There is no tenderness.  Musculoskeletal: Normal range of motion.  She exhibits tenderness.  Left knee  Neurological: She is alert.  Skin: Skin is warm and dry.  Psychiatric: She has a normal mood and affect.     Labs reviewed: Basic Metabolic Panel:  Recent Labs  03/20/13 1247 10/03/13 1215 10/19/13 1035  NA 135 140 132*  K 5.4* 5.7* 3.9  CL 104 99 95*  CO2  --  24 23  GLUCOSE 102* 101* 126*  BUN 41* 28 22  CREATININE 0.80 0.87 0.59  CALCIUM  --  9.9 8.9   Liver Function Tests:  Recent Labs  10/03/13 1215  AST 18  ALT 12  ALKPHOS 81  BILITOT 0.2  PROT 6.8   No results found for this basename: LIPASE, AMYLASE,  in the last 8760 hours No results found for this basename: AMMONIA,  in the last 8760 hours CBC:  Recent Labs  03/20/13 1238 03/20/13 1247 10/03/13 1215 10/19/13 1035  WBC 9.0  --  7.9 6.3  NEUTROABS 5.8  --  4.5 4.4  HGB 13.0 13.6 13.0 12.7  HCT 38.4 40.0 38.5 37.3  MCV 94.3  --  92 93.5  PLT 266  --   --  231   Lab Results  Component Value Date   HGBA1C 6.0* 10/03/2013     Assessment/Plan 1. Primary osteoarthritis of left knee - says her naproxen reaction was "swelling of the right leg" not angioedema so wants to try celebrex as needed - celecoxib (CELEBREX) 100 MG capsule; Take 1 capsule (100 mg total) by mouth daily as needed for moderate pain. In knees  Dispense: 30 capsule; Refill: 3 -warned that if she develops concerning true swelling, she should stop it immediately and go to the ER  2. Edema -will reduce dose to lasix 20mg  - furosemide (LASIX) 40 MG tablet; Take 0.5 tablets (20 mg total) by mouth daily.  Dispense: 30 tablet; Refill:  3  3. Essential hypertension, benign -bp at goal  4. Vulvar burning -has resolved with use of KY  5. Peripheral neuropathy -cont gabapentin for now--if desires to change to lyrica in the future, we will try that  6. Polyuria -reduce diuretic before trying myrbetriq  7. Solitary pulmonary nodule on lung CT -refused flu shot  Labs/tests ordered: get lipids b/c she is fasting (was supposed to have had several months ago)  Next appt:  3 mos

## 2013-12-23 NOTE — Addendum Note (Signed)
Addended by: Darliss Ridgel D on: 12/23/2013 10:22 AM   Modules accepted: Orders

## 2013-12-23 NOTE — Patient Instructions (Signed)
Try to use celebrex as needed for your pain.  If you develop any swelling, stop immediately and go to the ER.  If you have any problems with stomach upset, stop it.  Decrease your lasix to 20mg  from 40mg  (you can cut your pills in 1/2).

## 2013-12-24 LAB — LIPID PANEL
Chol/HDL Ratio: 4.1 ratio units (ref 0.0–4.4)
Cholesterol, Total: 309 mg/dL — ABNORMAL HIGH (ref 100–199)
HDL: 75 mg/dL (ref >39)
LDL Calculated: 222 mg/dL — ABNORMAL HIGH (ref 0–99)
Triglycerides: 62 mg/dL (ref 0–149)
VLDL Cholesterol Cal: 12 mg/dL (ref 5–40)

## 2013-12-26 ENCOUNTER — Telehealth: Payer: Self-pay | Admitting: *Deleted

## 2013-12-26 ENCOUNTER — Other Ambulatory Visit: Payer: Self-pay | Admitting: *Deleted

## 2013-12-26 MED ORDER — PREGABALIN 75 MG PO CAPS: 75.0000 mg | ORAL_CAPSULE | Freq: Every day | ORAL | Status: DC

## 2013-12-26 MED ORDER — PREGABALIN 75 MG PO CAPS: ORAL_CAPSULE | ORAL | Status: DC

## 2013-12-26 NOTE — Patient Instructions (Signed)
Patient requested to try Lyrica 75 mg for neuropathy pain, script was fax to pharmacy. Patient had discussed this medication at a prior appointment with Dr. Mariea Clonts. Patient is to stop taking Gabapentin during this trial of Lyrica.

## 2013-12-26 NOTE — Addendum Note (Signed)
Addended by: Eilene Ghazi on: 12/26/2013 02:39 PM   Modules accepted: Orders, Medications

## 2013-12-26 NOTE — Telephone Encounter (Signed)
Spoke with the patient's son regarding her lipid panel results. He stated that he would tell her and also stated that she requested her Gabapentin back for

## 2014-01-02 ENCOUNTER — Ambulatory Visit: Payer: Self-pay | Admitting: Internal Medicine

## 2014-01-08 ENCOUNTER — Emergency Department (INDEPENDENT_AMBULATORY_CARE_PROVIDER_SITE_OTHER)
Admission: EM | Admit: 2014-01-08 | Discharge: 2014-01-08 | Disposition: A | Discharge status: Home / Self Care | Payer: Medicare Other | Source: Home / Self Care | Attending: Emergency Medicine | Admitting: Emergency Medicine

## 2014-01-08 ENCOUNTER — Encounter (HOSPITAL_COMMUNITY): Payer: Self-pay | Admitting: Emergency Medicine

## 2014-01-08 DIAGNOSIS — N3000 Acute cystitis without hematuria: Secondary | ICD-10-CM

## 2014-01-08 LAB — POCT URINALYSIS DIP (DEVICE)
Bilirubin Urine: NEGATIVE
Glucose, UA: NEGATIVE mg/dL
Hgb urine dipstick: NEGATIVE
Ketones, ur: NEGATIVE mg/dL
NITRITE: NEGATIVE
PROTEIN: NEGATIVE mg/dL
Specific Gravity, Urine: 1.01 (ref 1.005–1.030)
UROBILINOGEN UA: 0.2 mg/dL (ref 0.0–1.0)
pH: 5.5 (ref 5.0–8.0)

## 2014-01-08 MED ORDER — CEFUROXIME AXETIL 250 MG PO TABS: 250.0000 mg | ORAL_TABLET | Freq: Two times a day (BID) | ORAL | Status: DC

## 2014-01-08 NOTE — ED Notes (Signed)
C/o pani w urination

## 2014-01-08 NOTE — ED Provider Notes (Signed)
Chief Complaint   No chief complaint on file.   History of Present Illness   [name] is a 80 year old female who has had a three-day history of dysuria, frequency, and urgency. She denies any hematuria, abdominal pain, lower back pain, fever, chills, nausea, or vomiting. She has had urinary tract infections before. She was seen for the same thing about a month ago. She took Ceftin and this did seem to help. She would like to get this again.  Review of Systems   Other than as noted above, the patient denies any of the following symptoms: General:  No fevers or chills. GI:  No abdominal pain, back pain, nausea, or vomiting. GU:  No hematuria or incontinence. GYN:  No discharge, itching, vulvar pain or lesions, pelvic pain, or abnormal vaginal bleeding.  [place]   Past medical history, family history, social history, meds, and allergies were reviewed.  She is allergic to Aleve. Current meds include relief, Celebrex, Flexeril, Lasix, Neurontin, potassium chloride, losartan, metoprolol, MiraLax, and Lyrica. Medical history includes hiatal hernia, mitral valve prolapse, hypertension, and deafness.  Physical Examination     Vital signs:  BP 147/84  Pulse 70  Temp(Src) 97.6 F (36.4 C) (Oral)  Resp 16  SpO2 95% Gen:  Alert, oriented, in no distress. Lungs:  Clear to auscultation, no wheezes, rales or rhonchi. Heart:  Regular rhythm, no gallop or murmer. Abdomen:  Flat and soft. There was slight suprapubic pain to palpation.  No guarding, or rebound.  No hepato-splenomegaly or mass.  Bowel sounds were normally active.  No hernia. Back:  No CVA tenderness.  Skin:  Clear, warm and dry.  Labs   Results for orders placed during the hospital encounter of 01/08/14  POCT URINALYSIS DIP (DEVICE)      Result Value Ref Range   Glucose, UA NEGATIVE  NEGATIVE mg/dL   Bilirubin Urine NEGATIVE  NEGATIVE   Ketones, ur NEGATIVE  NEGATIVE mg/dL   Specific Gravity, Urine 1.010  1.005 -  1.030   Hgb urine dipstick NEGATIVE  NEGATIVE   pH 5.5  5.0 - 8.0   Protein, ur NEGATIVE  NEGATIVE mg/dL   Urobilinogen, UA 0.2  0.0 - 1.0 mg/dL   Nitrite NEGATIVE  NEGATIVE   Leukocytes, UA TRACE (*) NEGATIVE     A urine culture was obtained.  Results are pending at this time and we will call about any positive results.  Assessment   The encounter diagnosis was Acute cystitis without hematuria.   No evidence of pyelonephritis.    Plan   1.  Meds:  The following meds were prescribed:   New Prescriptions   CEFUROXIME (CEFTIN) 250 MG TABLET    Take 1 tablet (250 mg total) by mouth 2 (two) times daily with a meal.    2.  Patient Education/Counseling:  The patient was given appropriate handouts, self care instructions, and instructed in symptomatic relief. The patient was told to avoid intercourse for 10 days, get extra fluids, and return for a follow up with her primary care doctor at the completion of treatment for a repeat UA and culture.    3.  Follow up:  The patient was told to follow up here if no better in 3 to 4 days, or sooner if becoming worse in any way, and given some red flag symptoms such as fever, persistent vomiting, or severe flank or abdominal pain which would prompt immediate return.     Harden Mo, MD 01/08/14 [phone]

## 2014-01-08 NOTE — Discharge Instructions (Signed)

## 2014-01-11 LAB — URINE CULTURE: Colony Count: 25000

## 2014-01-11 NOTE — Progress Notes (Signed)
Quick Note:  Results are abnormal as noted, but have been adequately treated. No further action necessary. The patient was treated with cefuroxime. This is the equivalent of ceftriaxone, and the organism should be sensitive. No further treatment is necessary other than finishing up her present dose of antibiotics. ______

## 2014-01-12 NOTE — ED Notes (Signed)
Urine culture: 25,000 colonies Providencia Rettgeri.  Pt. adequately treated with Cefuroxime. [name] 01/12/2014

## 2014-01-18 ENCOUNTER — Ambulatory Visit
Admission: RE | Admit: 2014-01-18 | Discharge: 2014-01-18 | Disposition: A | Discharge status: Home / Self Care | Payer: Medicare Other | Source: Ambulatory Visit | Attending: Surgery | Admitting: Surgery

## 2014-01-18 ENCOUNTER — Other Ambulatory Visit: Payer: Medicare Other

## 2014-01-18 ENCOUNTER — Ambulatory Visit (INDEPENDENT_AMBULATORY_CARE_PROVIDER_SITE_OTHER): Payer: Medicare Other | Admitting: Surgery

## 2014-01-18 VITALS — BP 133/72 | HR 56 | Ht 67.0 in | Wt 176.0 lb

## 2014-01-18 DIAGNOSIS — R911 Solitary pulmonary nodule: Secondary | ICD-10-CM

## 2014-01-18 DIAGNOSIS — R918 Other nonspecific abnormal finding of lung field: Secondary | ICD-10-CM

## 2014-01-19 ENCOUNTER — Encounter: Payer: Self-pay | Admitting: Surgery

## 2014-01-19 NOTE — Progress Notes (Signed)
HPI:  She returns today for follow-up of a left apical lung mass. The patient is a 80 year old woman who lives at home with her disabled son who suffered a fall in May 2014 after tripping over her son. She suffered a displaced right femoral neck fracture and a right wrist fracture. She had a CT scan of the neck done that showed a left apical lung mass. A chest CT on 08/25/2012 showed an irregular mass measuring 3.7 x 2.0 x 2.2 cm. She had her hip repaired in May and then her wrist repaired in June. A follow up CT of the chest on 12/08/2012 showed the irregular LUL mass to be unchanged. A PET scan on 01/06/2013 showed no change in the size of the mass but it did have some patchy hypermetabolic uptake with an SUV of 3.8. There was equivocal left hilar and right infrahilar nodal activity with SUV of 4.8 and 5.3, respectively. The background activity was 4.0. She has been a life-long nonsmoker. When I saw her initially in October 2014 she was not felt to be a candidate for a lobectomy and was not interested in chemotherapy or radiation therapy if this was a cancer. Therefore I did not feel a biopsy was indicated and we decided to follow this lesion. She has continued to feel fairly well despite her age. She denies cough and sputum production.      Current Outpatient Prescriptions  Medication Sig Dispense Refill  . acetaminophen (TYLENOL) 325 MG tablet Take 325-650 mg by mouth every 4 (four) hours as needed for mild pain.      . Calcium Glycerophosphate (PRELIEF PO) Take 65 mg by mouth daily as needed (as needed for indigestion).       . cefUROXime (CEFTIN) 250 MG tablet Take 1 tablet (250 mg total) by mouth 2 (two) times daily with a meal.  14 tablet  0  . cyclobenzaprine (FLEXERIL) 5 MG tablet Take 1 tablet (5 mg total) by mouth 3 (three) times daily as needed for muscle spasms.  30 tablet  2  . furosemide (LASIX) 40 MG tablet Take 0.5 tablets (20 mg total) by mouth daily.  30 tablet  3  . gabapentin  (NEURONTIN) 600 MG tablet TAKE 1 TABLET (600 MG TOTAL) BY MOUTH 3 (THREE) TIMES DAILY.  90 tablet  3  . KLOR-CON M20 20 MEQ tablet Take 20 mEq by mouth daily.       Marland Kitchen losartan (COZAAR) 50 MG tablet TAKE 1 TABLET (50 MG TOTAL) BY MOUTH DAILY.  30 tablet  9  . metoprolol succinate (TOPROL-XL) 100 MG 24 hr tablet       . mometasone (NASONEX) 50 MCG/ACT nasal spray PLACE 2 SPRAYS INTO THE NOSE As needed.      . polyethylene glycol (MIRALAX / GLYCOLAX) packet Take 17 g by mouth daily as needed for mild constipation (constipation).       . sennosides-docusate sodium (SENOKOT-S) 8.6-50 MG tablet Take 1 tablet by mouth 2 (two) times daily.       No current facility-administered medications for this visit.     Physical Exam: BP 133/72  Pulse 56  Ht 5\' 7"  (1.702 m)  Wt 176 lb (79.833 kg)  BMI 27.56 kg/m2  SpO2 96% She looks good for her age There is no cervical or supraclavicular adenopathy Lungs are clear Cardiac exam shows a regular rate and rhythm with normal heart sounds  Diagnostic Tests:  CLINICAL DATA: Followup lung mass  EXAM:  CT CHEST WITHOUT CONTRAST  TECHNIQUE:  Multidetector CT imaging of the chest was performed following the  standard protocol without IV contrast.  COMPARISON: CT chest of 07/20/2013 and 08/25/2012  FINDINGS:  The irregularly marginated lobular left upper lobe -apical mass has  increased in size now measuring approximately 5.6 x 2.8 cm compared  to prior measurements of 4.6 x 2.5 cm. There is more linear and  nodular soft tissue extension of this mass to the pleura primarily  laterally, posteriorly, and medially. Also are a small adjacent  satellite metastasis is noted. A small subpleural nodule of 5 mm is  noted posteriorly in the right upper lobe and may appear present  previously better. He of increases size a may represent a  contralateral metastatic lesion as well. No pleural effusion is  seen. The central airway is patent. The previously noted  retro hilar  nodule is not definitely seen on the current study. No acute bony  abnormality is seen.  On this unenhanced study, the thyroid gland is unremarkable. No  mediastinal or hilar adenopathy is seen with certainty. Prominent  pulmonary artery segments most likely reflect a degree of pulmonary  artery hypertension. Coronary artery calcifications are noted  primarily in the distribution of the left anterior descending  artery. Higher attenuation material layers in the gallbladder most  consistent with gallbladder debris and possibly noncalcified  gallstones. A vague low-attenuation structure emanates from the  upper pole of the right kidney which is difficult to assess on this  unenhanced study.  IMPRESSION:  1. Increase in size of left upper lobe-apical mass with more  extension toward the pleura consistent with a progressively  enlarging lung neoplasm.  2. Satellite metastatic nodule is present in the left upper lobe.  3. Subpleural nodule in the right upper lobe may represent a  contralateral metastatic lesion as well.  4. Probable noncalcified gallstones and gallbladder sludge.  Electronically Signed  By: Ivar Drape M.D.  On: 01/18/2014 15:14   Impression:  There has been some increase in the size of the left apical lung mass since April and it is very suspicious for a slow growing lung cancer. There is also a small satellite nodule in the LUL and a small RUL subpleural nodule. I reviewed the CT scans and prior PET scan with the patient and her son. She says she feels fine and is 89 and does not want to proceed with any further workup or biopsy since she does not want any treatment. She understands that if this is a lung cancer it will continue to enlarge and metastasize. She does not want to do any further studies unless she is having symptoms.   Plan:  I will be happy to see her back if she develops an symptoms and wants to ask questions or proceed with any further  workup.

## 2014-01-26 ENCOUNTER — Telehealth: Payer: Self-pay | Admitting: *Deleted

## 2014-01-26 NOTE — Telephone Encounter (Signed)
Pt wanted Dr. Marin Comment to know her vaginal irritation and burning has finally gone away.

## 2014-01-30 ENCOUNTER — Encounter: Payer: Self-pay | Admitting: Surgery

## 2014-02-09 ENCOUNTER — Encounter (HOSPITAL_COMMUNITY): Payer: Self-pay | Admitting: Family Medicine

## 2014-02-09 ENCOUNTER — Emergency Department (INDEPENDENT_AMBULATORY_CARE_PROVIDER_SITE_OTHER)
Admission: EM | Admit: 2014-02-09 | Discharge: 2014-02-09 | Disposition: A | Discharge status: Home / Self Care | Payer: Medicare Other | Source: Home / Self Care | Attending: Family Medicine | Admitting: Family Medicine

## 2014-02-09 DIAGNOSIS — R42 Dizziness and giddiness: Secondary | ICD-10-CM

## 2014-02-09 DIAGNOSIS — E871 Hypo-osmolality and hyponatremia: Secondary | ICD-10-CM

## 2014-02-09 HISTORY — DX: Polyneuropathy, unspecified: G62.9

## 2014-02-09 LAB — POCT I-STAT, CHEM 8
BUN: 19 mg/dL (ref 6–23)
CALCIUM ION: 1.21 mmol/L (ref 1.13–1.30)
Chloride: 104 mEq/L (ref 96–112)
Creatinine, Ser: 0.8 mg/dL (ref 0.50–1.10)
GLUCOSE: 115 mg/dL — AB (ref 70–99)
HEMATOCRIT: 41 % (ref 36.0–46.0)
HEMOGLOBIN: 13.9 g/dL (ref 12.0–15.0)
Potassium: 4.6 mEq/L (ref 3.7–5.3)
Sodium: 137 mEq/L (ref 137–147)
TCO2: 25 mmol/L (ref 0–100)

## 2014-02-09 NOTE — ED Notes (Signed)
80 year old female who is very alert and talkative.  States that she thinks that her Sodium levels might be low.  Says she feels a little weak and some slight dizziness.

## 2014-02-09 NOTE — Discharge Instructions (Signed)
Your sodium was normal today Please increase your sodium in the future as needed for symptoms  Please follow up with your regular doctor regularly to ensure you are on the proper medications Please get up adn move slowly to prevent falls.

## 2014-02-09 NOTE — ED Provider Notes (Signed)
CSN: [id number]     Arrival date & time 02/09/14  1106 History   First MD Initiated Contact with Patient 02/09/14 1115     Chief Complaint  Patient presents with  . Weakness   (Consider location/radiation/quality/duration/timing/severity/associated sxs/prior Treatment) HPI  Feeling week since yesterday. Typically when pt goes from lying or sitting to standing called PCP and told to come in for lab shcek as has a hhistory of hyponatremia. Typically feels this way when her Na is low. Denies CP, palpitaitons, syncope, SOB. Increased salt in diet last night and this morning w/ benefit. Feeling better overall.  Unsure of what medications she takes but thinks it includes Losartan, gabapentin, metoprolol lasix. Son here assisting w/ visit today  Recent Dx of vertigo after ED visit a couple weeks ago.  Past Medical History  Diagnosis Date  . Neuropathy   . Hypertension    History reviewed. No pertinent past surgical history. No family history on file. History  Substance Use Topics  . Smoking status: Never Smoker   . Smokeless tobacco: Not on file  . Alcohol Use: No   OB History    No data available     Review of Systems Per HPI with all other pertinent systems negative.   Allergies  Review of patient's allergies indicates no known allergies.  Home Medications   Prior to Admission medications   Not on File   Pulse 82  Temp(Src) 97.9 F (36.6 C) (Oral)  SpO2 98% Physical Exam  Constitutional: She appears well-developed and well-nourished.  Very hard of hearing  HENT:  Head: Normocephalic and atraumatic.  Eyes: EOM are normal. Pupils are equal, round, and reactive to light.  Neck: Normal range of motion.  Cardiovascular: Normal rate and normal heart sounds.   No murmur heard. Pulmonary/Chest: Effort normal. No respiratory distress.  Abdominal: She exhibits no distension.  Musculoskeletal: Normal range of motion. She exhibits no tenderness.  Neurological: She is alert.  No cranial nerve deficit. She exhibits normal muscle tone. Coordination normal.  Skin: Skin is warm. No rash noted.  Psychiatric: She has a normal mood and affect. Her behavior is normal. Judgment and thought content normal.    ED Course  Procedures (including critical care time) Labs Review Labs Reviewed  POCT I-STAT, CHEM 8 - Abnormal; Notable for the following:    Glucose, Bld 115 (*)    All other components within normal limits    Imaging Review No results found.   MDM   1. Hyponatremia   2. Vertigo    Hyponatremia: likely intermittently hyponatremic. Na 137 today. Pt had increased dietary intake of Na over the past 12 hours or so. This likely brought levels from low to low-normal. Continue to change intake as needed for symptoms.   Some of symptoms from intermittent vertigo as previously Dx.   Orthostatics negative.   HTN: continue current BP regimen   Discussed fall precautions.    Precautions given and all questions answered   Linna Darner, MD Family Medicine 02/09/2014, 12:06 PM    Waldemar Dickens, MD 02/09/14 [phone]

## 2014-02-13 ENCOUNTER — Other Ambulatory Visit: Payer: Self-pay | Admitting: Internal Medicine

## 2014-02-15 ENCOUNTER — Other Ambulatory Visit: Payer: Self-pay | Admitting: Internal Medicine

## 2014-02-22 ENCOUNTER — Encounter: Payer: Self-pay | Admitting: Surgery

## 2014-03-09 ENCOUNTER — Other Ambulatory Visit: Payer: Self-pay | Admitting: Internal Medicine

## 2014-03-09 ENCOUNTER — Telehealth: Payer: Self-pay | Admitting: *Deleted

## 2014-03-09 MED ORDER — SERTRALINE HCL 50 MG PO TABS: ORAL_TABLET | ORAL | Status: DC

## 2014-03-09 NOTE — Telephone Encounter (Signed)
No xanax, just zoloft.

## 2014-03-09 NOTE — Telephone Encounter (Signed)
Patient caregiver called wanting a refill on Xanax .5mg  Three times daily as needed for anxiety. This is not in patient's medication list and stated that previous Dr. Prescribed and need a refill. Please Advise.. Wants it called in to [facility]

## 2014-03-09 NOTE — Telephone Encounter (Signed)
Patient came into the office with the Rx bottle. The Rx is not for Xanax like caregiver stated. It is for Sertraline 50mg  One by mouth daily for anxiety. Ok to give Rx per Dr. Mariea Clonts. Printed and given to patient.

## 2014-03-15 ENCOUNTER — Other Ambulatory Visit: Payer: Self-pay | Admitting: Internal Medicine

## 2014-03-27 ENCOUNTER — Ambulatory Visit: Payer: Self-pay | Admitting: Internal Medicine

## 2014-04-04 ENCOUNTER — Ambulatory Visit: Payer: Self-pay | Admitting: Internal Medicine

## 2014-04-04 ENCOUNTER — Ambulatory Visit: Payer: BC Managed Care – PPO | Admitting: Internal Medicine

## 2014-04-28 ENCOUNTER — Encounter: Payer: Self-pay | Admitting: Internal Medicine

## 2014-04-28 ENCOUNTER — Ambulatory Visit (INDEPENDENT_AMBULATORY_CARE_PROVIDER_SITE_OTHER): Payer: Medicare Other | Admitting: Internal Medicine

## 2014-04-28 VITALS — BP 150/82 | HR 70 | Temp 97.5°F | Resp 20 | Ht 67.0 in | Wt 174.5 lb

## 2014-04-28 DIAGNOSIS — Z9181 History of falling: Secondary | ICD-10-CM

## 2014-04-28 DIAGNOSIS — I10 Essential (primary) hypertension: Secondary | ICD-10-CM

## 2014-04-28 DIAGNOSIS — G629 Polyneuropathy, unspecified: Secondary | ICD-10-CM

## 2014-04-28 DIAGNOSIS — H9193 Unspecified hearing loss, bilateral: Secondary | ICD-10-CM

## 2014-04-28 DIAGNOSIS — R609 Edema, unspecified: Secondary | ICD-10-CM

## 2014-04-28 DIAGNOSIS — Z23 Encounter for immunization: Secondary | ICD-10-CM

## 2014-04-28 DIAGNOSIS — M1712 Unilateral primary osteoarthritis, left knee: Secondary | ICD-10-CM

## 2014-04-28 NOTE — Addendum Note (Signed)
Addended by: Eilene Ghazi on: 04/28/2014 12:13 PM   Modules accepted: Orders

## 2014-04-28 NOTE — Progress Notes (Signed)
Patient ID: [name], female   DOB: 1915-07-20, 81 y.o.   MRN: [id number]   Location:  [facility] / [facility]  Code Status: DNR  Allergies  Allergen Reactions  . Aleve [Naproxen Sodium] Swelling    Legs and feet    Chief Complaint  Patient presents with  . Medical Management of Chronic Issues    HPI: Patient is a 81 y.o. white female seen in the office today for med mgt of chronic diseases.  She fell a couple of weeks ago in the kitchen and hit her knee on the dishwasher and it is still painful off and on.  She also bumped her right upper arm which is quite bruised up, but she's moving it well and the pain is better.    The zoloft is really helping her nerves since November.  She was stressed with the holidays.  Helping her sleep, too.  Her bowels are moving with her current regimen, but her son asks about using some probiotics which is ok.  She is doing well with 1/2 of a 40mg  lasix tablet daily--this has helped to decreased her urinary frequency some so she is more comfortable.    We have discussed how flexeril is not recommended in her age group, but she has not accepted this.  She does not want to take statins despite her stroke risk at this time with her LDL so high.  She used to be on one for 20-30 years, she says and does not want it anymore.  Review of Systems:  Review of Systems  Constitutional: Negative for fever, chills and malaise/fatigue.  HENT: Positive for hearing loss. Negative for congestion.   Eyes: Negative for blurred vision.  Respiratory: Negative for shortness of breath.   Cardiovascular: Negative for chest pain and leg swelling.  Gastrointestinal: Positive for constipation. Negative for abdominal pain, blood in stool and melena.  Genitourinary: Positive for urgency. Negative for dysuria and frequency.  Musculoskeletal: Positive for joint pain and falls.  Skin: Negative for rash.  Neurological: Positive for  dizziness. Negative for weakness and headaches.  Psychiatric/Behavioral: Positive for memory loss. Negative for depression. The patient is nervous/anxious. The patient does not have insomnia.      Past Medical History  Diagnosis Date  . History of hiatal hernia   . Mitral valve prolapse   . Plantar fasciitis   . Hay fever   . GERD (gastroesophageal reflux disease)   . Palpitations   . Neuromuscular disorder   . Deafness     "very HOH; even w/hearing aides we have to communicate via writing things down for her to read" (08/25/2012)  . H/O hiatal hernia   . Arthritis     "a little all over" (08/25/2012)  . Lung mass     Hx: of  . Anxiety   . Hemorrhoids   . Neuropathy   . Hypertension     Past Surgical History  Procedure Laterality Date  . Foot surgery Bilateral ~ 2000    "pinched nerve on feet" (08/25/2012)  . Vesicovaginal fistula closure w/ tah    . Abdominal hysterectomy  1998  . Tonsillectomy and adenoidectomy  1940's  . Cesarean section  1952  . Knee arthroscopy w/ meniscectomy Left 07/15/2010    partial medial and lateral meniscectomies.Archie Endo 07/15/2010 (08/25/2012)  . Hip arthroplasty Right 08/26/2012    Procedure: ARTHROPLASTY BIPOLAR HIP;  Surgeon: Mauri Pole, MD;  Location: [place];  Service: Orthopedics;  Laterality: Right;  .  Open reduction internal fixation (orif) distal radial fracture Right 09/11/2012    Procedure: OPEN REDUCTION INTERNAL FIXATION (ORIF) RIGHT DISTAL RADIAL FRACTURE/ULNAR POSSIBLE BONE GRAFTING;  Surgeon: Linna Hoff, MD;  Location: [place];  Service: Orthopedics;  Laterality: Right;    Social History:   reports that she has never smoked. She does not have any smokeless tobacco history on file. She reports that she does not drink alcohol or use illicit drugs.  Family History  Problem Relation Age of Onset  . Colon cancer Mother   . Colon cancer Sister   . Cancer Sister     Breast  . Colon cancer Brother   . Heart disease Brother      Heart Attack  . Cancer Father     Leukemia    Medications: Patient's Medications  New Prescriptions   No medications on file  Previous Medications   ACETAMINOPHEN (TYLENOL) 325 MG TABLET    Take 325-650 mg by mouth every 4 (four) hours as needed for mild pain.   CALCIUM GLYCEROPHOSPHATE (PRELIEF PO)    Take 65 mg by mouth daily as needed (as needed for indigestion).    CYCLOBENZAPRINE (FLEXERIL) 5 MG TABLET    Take 1 tablet (5 mg total) by mouth 3 (three) times daily as needed for muscle spasms.   FUROSEMIDE (LASIX) 40 MG TABLET    Take 0.5 tablets (20 mg total) by mouth daily.   GABAPENTIN (NEURONTIN) 600 MG TABLET    TAKE 1 TABLET (600 MG TOTAL) BY MOUTH 3 (THREE) TIMES DAILY.   KLOR-CON M20 20 MEQ TABLET    Take 20 mEq by mouth daily.    LOSARTAN (COZAAR) 50 MG TABLET    TAKE 1 TABLET (50 MG TOTAL) BY MOUTH DAILY.   METOPROLOL SUCCINATE (TOPROL-XL) 100 MG 24 HR TABLET    TAKE ONE TABLET BY MOUTH ONCE DAILY.   MOMETASONE (NASONEX) 50 MCG/ACT NASAL SPRAY    PLACE 2 SPRAYS INTO THE NOSE As needed.   POLYETHYLENE GLYCOL (MIRALAX / GLYCOLAX) PACKET    Take 17 g by mouth daily as needed for mild constipation (constipation).    SENNOSIDES-DOCUSATE SODIUM (SENOKOT-S) 8.6-50 MG TABLET    Take 1 tablet by mouth 2 (two) times daily.   SERTRALINE (ZOLOFT) 50 MG TABLET    TAKE 1 TABLET (50 MG TOTAL) BY MOUTH DAILY. TAKE DAILY TO CONTROL ANXIETY  Modified Medications   No medications on file  Discontinued Medications   CEFUROXIME (CEFTIN) 250 MG TABLET    Take 1 tablet (250 mg total) by mouth 2 (two) times daily with a meal.   METOPROLOL SUCCINATE (TOPROL-XL) 100 MG 24 HR TABLET       METOPROLOL SUCCINATE (TOPROL-XL) 100 MG 24 HR TABLET    TAKE ONE TABLET BY MOUTH ONCE DAILY.   SERTRALINE (ZOLOFT) 50 MG TABLET    Take one tablet by mouth once daily to control anxiety     Physical Exam: Filed Vitals:   04/28/14 0825  BP: 150/82  Pulse: 70  Temp: 97.5 F (36.4 C)  TempSrc: Oral  Resp: 20    Height: 5\' 7"  (1.702 m)  Weight: 174 lb 8 oz (79.153 kg)  SpO2: 96%  Physical Exam  Constitutional: She appears well-developed and well-nourished. No distress.  HENT:  Extremely HOH today  Cardiovascular: Normal rate, regular rhythm, normal heart sounds and intact distal pulses.   Pulmonary/Chest: Effort normal and breath sounds normal. No respiratory distress.  Abdominal: Soft. Bowel sounds are normal. She  exhibits no distension. There is no tenderness.  Musculoskeletal: Normal range of motion.  Neurological: She is alert. No cranial nerve deficit. She exhibits normal muscle tone.  Skin: Skin is warm and dry.  Large ecchymoses of right upper arm;  No bruising of left knee  Psychiatric: She has a normal mood and affect.     Labs reviewed: Basic Metabolic Panel:  Recent Labs  10/03/13 1215 10/19/13 1035 02/09/14 1132  NA 140 132* 137  K 5.7* 3.9 4.6  CL 99 95* 104  CO2 24 23  --   GLUCOSE 101* 126* 115*  BUN 28 22 19   CREATININE 0.87 0.59 0.80  CALCIUM 9.9 8.9  --    Liver Function Tests:  Recent Labs  10/03/13 1215  AST 18  ALT 12  ALKPHOS 81  BILITOT 0.2  PROT 6.8   No results for input(s): LIPASE, AMYLASE in the last 8760 hours. No results for input(s): AMMONIA in the last 8760 hours. CBC:  Recent Labs  10/03/13 1215 10/19/13 1035 02/09/14 1132  WBC 7.9 6.3  --   NEUTROABS 4.5 4.4  --   HGB 13.0 12.7 13.9  HCT 38.5 37.3 41.0  MCV 92 93.5  --   PLT  --  231  --    Lipid Panel:  Recent Labs  12/23/13 1019  HDL 75  LDLCALC 222*  TRIG 62  CHOLHDL 4.1   Lab Results  Component Value Date   HGBA1C 6.0* 10/03/2013    Assessment/Plan 1. Primary osteoarthritis of left knee -getting better over time after a fall  2. Edema - cont 1/2 pill lasix and potassium and f/u labs next time - Basic metabolic panel; Future  3. History of fall -in the kitchen, discussed avoiding flexeril and other meds she takes that I have not prescribed  4.  Essential hypertension, benign -bp right around goal of <150/90 considering her fall risk - Basic metabolic panel; Future  5. Hearing loss, bilateral - cont hearing aides--seems she needs new batteries today  6. Peripheral neuropathy -cont gabapentin - CBC with Differential/Platelet; Future - Basic metabolic panel; Future  7. Need for vaccination with 13-polyvalent pneumococcal conjugate vaccine -prevnar given  Labs/tests ordered:   Orders Placed This Encounter  Procedures  . CBC with Differential/Platelet    Standing Status: Future     Number of Occurrences:      Standing Expiration Date: 10/27/2014  . Basic metabolic panel    Standing Status: Future     Number of Occurrences:      Standing Expiration Date: 10/27/2014    Next appt:  3 mos with labs before  Charnelle Bergeman L. Rechy Bost, D.O. [facility] [address], [place] [address] Cell Phone (Mon-Fri 8am-5pm):  [phone] On Call:  [phone] & follow prompts after 5pm & weekends Office Phone:  [phone] Office Fax:  [phone]

## 2014-05-10 ENCOUNTER — Encounter: Payer: Self-pay | Admitting: Podiatry

## 2014-05-10 ENCOUNTER — Ambulatory Visit (INDEPENDENT_AMBULATORY_CARE_PROVIDER_SITE_OTHER): Payer: Medicare Other | Admitting: Podiatry

## 2014-05-10 VITALS — BP 143/68 | HR 71 | Ht 67.0 in | Wt 174.0 lb

## 2014-05-10 DIAGNOSIS — M79606 Pain in leg, unspecified: Secondary | ICD-10-CM

## 2014-05-10 DIAGNOSIS — B351 Tinea unguium: Secondary | ICD-10-CM

## 2014-05-10 DIAGNOSIS — L600 Ingrowing nail: Secondary | ICD-10-CM

## 2014-05-10 NOTE — Progress Notes (Signed)
Subjective:  81 y.o. year old female patient presents requesting toe nails trimmed.  Stated that her both great toe nails are ingrown and been hurting. She used Iodine that stopped from hurting.   Objective: Dermatologic:  All nails are hypertrophic and elongated.  Cryptotic nail both great toe medial border symptomatic.  Vascular:  Dorsalis pedis arteries are not palpable on both.  Posterior tibial pulses are palpable on both.  Orthopedic:  Rectus foot with mild digital contracture right foot.  Neurologic:  Decreased sensory perception to Monofilament sensory testing bilateral.   Assessment:  Dystrophic mycotic nails x 10.  Ingrown nail both great toes.  Peripheral neuropathy.   Treatment: All mycotic nails debrided.  Return in 3 months or as needed.

## 2014-05-10 NOTE — Patient Instructions (Signed)
Seen for hypertrophic nails. All nails debrided. Return in 3 months or as needed.  

## 2014-07-24 ENCOUNTER — Other Ambulatory Visit: Payer: Medicare Other

## 2014-07-24 DIAGNOSIS — G629 Polyneuropathy, unspecified: Secondary | ICD-10-CM

## 2014-07-24 DIAGNOSIS — R609 Edema, unspecified: Secondary | ICD-10-CM

## 2014-07-24 DIAGNOSIS — I10 Essential (primary) hypertension: Secondary | ICD-10-CM

## 2014-07-25 LAB — CBC WITH DIFFERENTIAL/PLATELET
Basophils Absolute: 0 10*3/uL (ref 0.0–0.2)
Basos: 1 %
EOS (ABSOLUTE): 0.2 10*3/uL (ref 0.0–0.4)
Eos: 3 %
Hematocrit: 38.6 % (ref 34.0–46.6)
Hemoglobin: 12.8 g/dL (ref 11.1–15.9)
Immature Grans (Abs): 0 10*3/uL (ref 0.0–0.1)
Immature Granulocytes: 0 %
Lymphocytes Absolute: 2.3 10*3/uL (ref 0.7–3.1)
Lymphs: 37 %
MCH: 31.3 pg (ref 26.6–33.0)
MCHC: 33.2 g/dL (ref 31.5–35.7)
MCV: 94 fL (ref 79–97)
Monocytes Absolute: 0.7 10*3/uL (ref 0.1–0.9)
Monocytes: 11 %
Neutrophils Absolute: 3 10*3/uL (ref 1.4–7.0)
Neutrophils: 48 %
Platelets: 249 10*3/uL (ref 150–379)
RBC: 4.09 x10E6/uL (ref 3.77–5.28)
RDW: 13.9 % (ref 12.3–15.4)
WBC: 6.2 10*3/uL (ref 3.4–10.8)

## 2014-07-25 LAB — BASIC METABOLIC PANEL
BUN/Creatinine Ratio: 23 (ref 11–26)
BUN: 19 mg/dL (ref 10–36)
CO2: 24 mmol/L (ref 18–29)
Calcium: 9 mg/dL (ref 8.7–10.3)
Chloride: 102 mmol/L (ref 97–108)
Creatinine, Ser: 0.84 mg/dL (ref 0.57–1.00)
GFR calc Af Amer: 68 mL/min/{1.73_m2} (ref >59)
GFR calc non Af Amer: 59 mL/min/{1.73_m2} — ABNORMAL LOW (ref >59)
Glucose: 96 mg/dL (ref 65–99)
Potassium: 4.5 mmol/L (ref 3.5–5.2)
Sodium: 140 mmol/L (ref 134–144)

## 2014-07-28 ENCOUNTER — Other Ambulatory Visit: Payer: Self-pay | Admitting: *Deleted

## 2014-07-28 ENCOUNTER — Ambulatory Visit (INDEPENDENT_AMBULATORY_CARE_PROVIDER_SITE_OTHER): Payer: Medicare Other | Admitting: Internal Medicine

## 2014-07-28 ENCOUNTER — Encounter: Payer: Self-pay | Admitting: Internal Medicine

## 2014-07-28 VITALS — BP 126/70 | HR 64 | Temp 98.1°F | Ht 67.0 in | Wt 179.0 lb

## 2014-07-28 DIAGNOSIS — H9193 Unspecified hearing loss, bilateral: Secondary | ICD-10-CM

## 2014-07-28 DIAGNOSIS — M1712 Unilateral primary osteoarthritis, left knee: Secondary | ICD-10-CM

## 2014-07-28 DIAGNOSIS — M6289 Other specified disorders of muscle: Secondary | ICD-10-CM

## 2014-07-28 DIAGNOSIS — I10 Essential (primary) hypertension: Secondary | ICD-10-CM

## 2014-07-28 DIAGNOSIS — E785 Hyperlipidemia, unspecified: Secondary | ICD-10-CM | POA: Diagnosis not present

## 2014-07-28 DIAGNOSIS — M6281 Muscle weakness (generalized): Secondary | ICD-10-CM

## 2014-07-28 MED ORDER — METHYLPREDNISOLONE ACETATE 40 MG/ML IJ SUSP
40.0000 mg | Freq: Once | INTRAMUSCULAR | Status: AC
  Administered 2014-07-28: 40 mg via INTRA_ARTICULAR

## 2014-07-28 MED ORDER — GABAPENTIN 600 MG PO TABS: ORAL_TABLET | ORAL | Status: DC

## 2014-07-28 NOTE — Telephone Encounter (Signed)
Patient came into the office and stated that the pharmacy told her that we denied her Rx. I called the pharmacy and they stated they didn't tell her that. Phoned in Rx to pharmacy. Patient Aware.

## 2014-07-28 NOTE — Patient Instructions (Signed)
Please cut back on your cookie intake.  Your bad cholesterol has gone up quite a bit.

## 2014-07-28 NOTE — Progress Notes (Signed)
Patient ID: [name], female   DOB: April 01, 1915, 81 y.o.   MRN: [id number]   Location:  [facility] / [facility]  Code Status: DNR Goals of Care: Advanced Directive information Does patient have an advance directive?: Yes, Type of Advance Directive: Living will;Healthcare Power of Attorney, Does patient want to make changes to advanced directive?: No - Patient declined   Allergies  Allergen Reactions  . Aleve [Naproxen Sodium] Swelling    Legs and feet    Chief Complaint  Patient presents with  . Medical Management of Chronic Issues    3 month follow-up, discuss labs (copy printed). Balance lost- discuss life alert ([place])   . Knee Problem    Discuss ongoing left knee pain     HPI: Patient is a 81 y.o. white female seen in the office today for med mgt.    Continues to c/o left knee pain.  Had previously had injections in her left knee which helped her.  Has not been getting relief with oral meds or topical agents.  She requests a knee injection.    She's having more difficulty getting up out of the chair and remaining balanced.  Her family is in the process of getting her a life alert button so she is safe if her son is not home.    Review of Systems:  Review of Systems  Constitutional: Negative for fever and chills.  HENT: Positive for hearing loss. Negative for congestion.        Extremely HOH, must write things down to communicate  Eyes: Negative for blurred vision.  Respiratory: Negative for shortness of breath.   Cardiovascular: Negative for chest pain.  Gastrointestinal: Negative for abdominal pain.  Genitourinary: Negative for dysuria.  Musculoskeletal: Positive for joint pain. Negative for falls.  Neurological: Negative for dizziness.  Psychiatric/Behavioral: Positive for memory loss.     Past Medical History  Diagnosis Date  . History of hiatal hernia   . Mitral valve prolapse   . Plantar fasciitis   . Hay fever     . GERD (gastroesophageal reflux disease)   . Palpitations   . Neuromuscular disorder   . Deafness     "very HOH; even w/hearing aides we have to communicate via writing things down for her to read" (08/25/2012)  . H/O hiatal hernia   . Arthritis     "a little all over" (08/25/2012)  . Lung mass     Hx: of  . Anxiety   . Hemorrhoids   . Neuropathy   . Hypertension     Past Surgical History  Procedure Laterality Date  . Foot surgery Bilateral ~ 2000    "pinched nerve on feet" (08/25/2012)  . Vesicovaginal fistula closure w/ tah    . Abdominal hysterectomy  1998  . Tonsillectomy and adenoidectomy  1940's  . Cesarean section  1952  . Knee arthroscopy w/ meniscectomy Left 07/15/2010    partial medial and lateral meniscectomies.Archie Endo 07/15/2010 (08/25/2012)  . Hip arthroplasty Right 08/26/2012    Procedure: ARTHROPLASTY BIPOLAR HIP;  Surgeon: Mauri Pole, MD;  Location: [place];  Service: Orthopedics;  Laterality: Right;  . Open reduction internal fixation (orif) distal radial fracture Right 09/11/2012    Procedure: OPEN REDUCTION INTERNAL FIXATION (ORIF) RIGHT DISTAL RADIAL FRACTURE/ULNAR POSSIBLE BONE GRAFTING;  Surgeon: Linna Hoff, MD;  Location: [place];  Service: Orthopedics;  Laterality: Right;    Social History:   reports that she has never smoked. She  has never used smokeless tobacco. She reports that she does not drink alcohol or use illicit drugs.  Family History  Problem Relation Age of Onset  . Colon cancer Mother   . Colon cancer Sister   . Cancer Sister     Breast  . Colon cancer Brother   . Heart disease Brother     Heart Attack  . Cancer Father     Leukemia    Medications: Patient's Medications  New Prescriptions   No medications on file  Previous Medications   ACETAMINOPHEN (TYLENOL) 325 MG TABLET    Take 325-650 mg by mouth every 4 (four) hours as needed for mild pain.   CALCIUM GLYCEROPHOSPHATE (PRELIEF PO)    Take 65 mg by mouth daily as needed (as  needed for indigestion).    CYCLOBENZAPRINE (FLEXERIL) 5 MG TABLET    Take 1 tablet (5 mg total) by mouth 3 (three) times daily as needed for muscle spasms.   FUROSEMIDE (LASIX) 40 MG TABLET    Take 0.5 tablets (20 mg total) by mouth daily.   GABAPENTIN (NEURONTIN) 600 MG TABLET    TAKE 1 TABLET (600 MG TOTAL) BY MOUTH 3 (THREE) TIMES DAILY.   KLOR-CON M20 20 MEQ TABLET    Take 20 mEq by mouth daily.    LOSARTAN (COZAAR) 50 MG TABLET    TAKE 1 TABLET (50 MG TOTAL) BY MOUTH DAILY.   METOPROLOL SUCCINATE (TOPROL-XL) 100 MG 24 HR TABLET    TAKE ONE TABLET BY MOUTH ONCE DAILY.   MOMETASONE (NASONEX) 50 MCG/ACT NASAL SPRAY    PLACE 2 SPRAYS INTO THE NOSE As needed.   POLYETHYLENE GLYCOL (MIRALAX / GLYCOLAX) PACKET    Take 17 g by mouth daily as needed for mild constipation (constipation).    SENNOSIDES-DOCUSATE SODIUM (SENOKOT-S) 8.6-50 MG TABLET    Take 1 tablet by mouth 2 (two) times daily.   SERTRALINE (ZOLOFT) 50 MG TABLET    TAKE 1 TABLET (50 MG TOTAL) BY MOUTH DAILY. TAKE DAILY TO CONTROL ANXIETY  Modified Medications   No medications on file  Discontinued Medications   No medications on file     Physical Exam: Filed Vitals:   07/28/14 1027  BP: 126/70  Pulse: 64  Temp: 98.1 F (36.7 C)  TempSrc: Oral  Height: '5\' 7"'$  (1.702 m)  Weight: 179 lb (81.194 kg)  SpO2: 95%  Physical Exam  Cardiovascular: Normal rate, regular rhythm and intact distal pulses.   Midsystolic click  Pulmonary/Chest: Effort normal and breath sounds normal. No respiratory distress.  Abdominal: Soft. Bowel sounds are normal.  Musculoskeletal: She exhibits tenderness.  Left lateral knee with crepitus  Neurological: She is alert.    Labs reviewed: Basic Metabolic Panel:  Recent Labs  10/03/13 1215 10/19/13 1035 02/09/14 1132 07/24/14 0909  NA 140 132* 137 140  K 5.7* 3.9 4.6 4.5  CL 99 95* 104 102  CO2 24 23  --  24  GLUCOSE 101* 126* 115* 96  BUN '28 22 19 19  '$ CREATININE 0.87 0.59 0.80 0.84    CALCIUM 9.9 8.9  --  9.0   Liver Function Tests:  Recent Labs  10/03/13 1215  AST 18  ALT 12  ALKPHOS 81  BILITOT 0.2  PROT 6.8   No results for input(s): LIPASE, AMYLASE in the last 8760 hours. No results for input(s): AMMONIA in the last 8760 hours. CBC:  Recent Labs  10/03/13 1215 10/19/13 1035 02/09/14 1132 07/24/14 0909  WBC 7.9 6.3  --  6.2  NEUTROABS 4.5 4.4  --  3.0  HGB 13.0 12.7 13.9  --   HCT 38.5 37.3 41.0 38.6  MCV 92 93.5  --   --   PLT  --  231  --   --    Lipid Panel:  Recent Labs  12/23/13 1019  CHOL 309*  HDL 75  LDLCALC 222*  TRIG 62  CHOLHDL 4.1   Lab Results  Component Value Date   HGBA1C 6.0* 10/03/2013   Assessment/Plan 1. Primary osteoarthritis of left knee -was only mild on xrays 4 years ago  -has already had steroid and hyalgan injections, but will try another depomedrol '40mg'$  and lidocaine injection here today to lateral knee -area was marked on lateral left knee, cleansed with betadine, then sprayed with bupivicaine numbing spray, and injection of '40mg'$  depomedrol with 101m lidocaine was performed into knee joint via lateral approach, betadine washed off and bandaid placed -pt expressed some immediate relief  -also ordered PT for her outpatient (her son is already going and could bring her for an appt before or after his own--[address] location)  2. Essential hypertension, benign -bp at goal with current therapy  3. Hearing loss, bilateral -severe and must use handwritten communication with her  4. Hyperlipidemia -very elevated LDL, but was having side effects from lipids -advised to cut back on cookies a bit, but, at her age, this is one of her pleasures in life so don't cut out altogether  5.  Proximal muscle weakness -second reason for therapy due to fall risk from this -probably would do better with a walker than a cane also  Labs/tests ordered:  Left knee injection done Next appt:  F/u 3 mos  Mallika Sanmiguel L. Bahja Bence,  D.O. [facility] [address] [place] [id number] Phone (Mon-Fri 8am-5pm):  [phone] Call:  [phone]& follow prompts after 5pm & weekends Office Phone:  [phone] Fax:  [phone]

## 2014-07-31 ENCOUNTER — Other Ambulatory Visit: Payer: Self-pay | Admitting: Internal Medicine

## 2014-07-31 DIAGNOSIS — M6281 Muscle weakness (generalized): Secondary | ICD-10-CM

## 2014-08-02 DIAGNOSIS — I10 Essential (primary) hypertension: Secondary | ICD-10-CM

## 2014-08-02 DIAGNOSIS — M1990 Unspecified osteoarthritis, unspecified site: Secondary | ICD-10-CM

## 2014-08-02 DIAGNOSIS — G609 Hereditary and idiopathic neuropathy, unspecified: Secondary | ICD-10-CM

## 2014-08-02 DIAGNOSIS — G709 Myoneural disorder, unspecified: Secondary | ICD-10-CM

## 2014-08-08 ENCOUNTER — Other Ambulatory Visit: Payer: Self-pay | Admitting: Internal Medicine

## 2014-09-02 ENCOUNTER — Other Ambulatory Visit: Payer: Self-pay | Admitting: Internal Medicine

## 2014-09-10 ENCOUNTER — Other Ambulatory Visit: Payer: Self-pay | Admitting: Internal Medicine

## 2014-09-11 ENCOUNTER — Other Ambulatory Visit: Payer: Self-pay | Admitting: Internal Medicine

## 2014-10-22 ENCOUNTER — Other Ambulatory Visit: Payer: Self-pay | Admitting: Internal Medicine

## 2014-10-27 ENCOUNTER — Encounter: Payer: Self-pay | Admitting: Internal Medicine

## 2014-10-27 ENCOUNTER — Ambulatory Visit (INDEPENDENT_AMBULATORY_CARE_PROVIDER_SITE_OTHER): Payer: Medicare Other | Admitting: Internal Medicine

## 2014-10-27 VITALS — BP 124/72 | HR 67 | Temp 98.2°F | Resp 16 | Ht 67.0 in | Wt 177.0 lb

## 2014-10-27 DIAGNOSIS — D509 Iron deficiency anemia, unspecified: Secondary | ICD-10-CM | POA: Diagnosis not present

## 2014-10-27 DIAGNOSIS — M179 Osteoarthritis of knee, unspecified: Secondary | ICD-10-CM | POA: Diagnosis not present

## 2014-10-27 DIAGNOSIS — E785 Hyperlipidemia, unspecified: Secondary | ICD-10-CM

## 2014-10-27 DIAGNOSIS — I10 Essential (primary) hypertension: Secondary | ICD-10-CM | POA: Diagnosis not present

## 2014-10-27 DIAGNOSIS — R609 Edema, unspecified: Secondary | ICD-10-CM | POA: Diagnosis not present

## 2014-10-27 DIAGNOSIS — M1710 Unilateral primary osteoarthritis, unspecified knee: Secondary | ICD-10-CM

## 2014-10-27 DIAGNOSIS — H9193 Unspecified hearing loss, bilateral: Secondary | ICD-10-CM

## 2014-10-27 MED ORDER — FUROSEMIDE 20 MG PO TABS: 20.0000 mg | ORAL_TABLET | Freq: Every day | ORAL | Status: DC

## 2014-10-27 NOTE — Progress Notes (Signed)
Patient ID: [name], female   DOB: 07/24/1915, 81 y.o.   MRN: [id number]   Location:  [facility]  Code Status: DNR Goals of Care: Advanced Directive information Does patient have an advance directive?: Yes, Type of Advance Directive: [place];Living will;Out of facility DNR (pink MOST or yellow form), Pre-existing out of facility DNR order (yellow form or pink MOST form): Yellow form placed in chart (order not valid for inpatient use), Does patient want to make changes to advanced directive?: No - Patient declined   Chief Complaint  Patient presents with  . Medical Management of Chronic Issues    3 month follow-up     HPI: Patient is a 81 y.o. white female seen in the office today for med mgt of her chronic diseases.  She is accompanied by her son.    Left knee--had [facility] procedure.  Has been a bit uncomfortable after surgery.  She told her the lateral thigh was tight.  It hurt, but seems to help.  Had a machine that she walked in with something pulled up over her.  It's expensive--$70 that medicare doesn't pay per knee.  Is getting 5 treatments.  Right knee is not bothering her, but getting both treated.  Has to go two different days due to medicare.  Is there Mon and Wed for 3 hrs a piece.    Only lost 2 lbs.  Says her son brings foods that tempt her.  At [employer], he bought two fruit desserts.    Review of Systems:  ROS  Past Medical History  Diagnosis Date  . History of hiatal hernia   . Mitral valve prolapse   . Plantar fasciitis   . Hay fever   . GERD (gastroesophageal reflux disease)   . Palpitations   . Neuromuscular disorder   . Deafness     "very HOH; even w/hearing aides we have to communicate via writing things down for her to read" (08/25/2012)  . H/O hiatal hernia   . Arthritis     "a little all over" (08/25/2012)  . Lung mass     Hx: of  . Anxiety   . Hemorrhoids   . Neuropathy     . Hypertension     Past Surgical History  Procedure Laterality Date  . Foot surgery Bilateral ~ 2000    "pinched nerve on feet" (08/25/2012)  . Vesicovaginal fistula closure w/ tah    . Abdominal hysterectomy  1998  . Tonsillectomy and adenoidectomy  1940's  . Cesarean section  1952  . Knee arthroscopy w/ meniscectomy Left 07/15/2010    partial medial and lateral meniscectomies.Archie Endo 07/15/2010 (08/25/2012)  . Hip arthroplasty Right 08/26/2012    Procedure: ARTHROPLASTY BIPOLAR HIP;  Surgeon: Mauri Pole, MD;  Location: [place];  Service: Orthopedics;  Laterality: Right;  . Open reduction internal fixation (orif) distal radial fracture Right 09/11/2012    Procedure: OPEN REDUCTION INTERNAL FIXATION (ORIF) RIGHT DISTAL RADIAL FRACTURE/ULNAR POSSIBLE BONE GRAFTING;  Surgeon: Linna Hoff, MD;  Location: [place];  Service: Orthopedics;  Laterality: Right;    Allergies  Allergen Reactions  . Aleve [Naproxen Sodium] Swelling    Legs and feet   Medications: Patient's Medications  New Prescriptions   No medications on file  Previous Medications   ACETAMINOPHEN (TYLENOL) 325 MG TABLET    Take 325-650 mg by mouth every 4 (four) hours as needed for mild pain.   CALCIUM GLYCEROPHOSPHATE (PRELIEF  PO)    Take 65 mg by mouth daily as needed (as needed for indigestion).    CYCLOBENZAPRINE (FLEXERIL) 5 MG TABLET    Take 1 tablet (5 mg total) by mouth 3 (three) times daily as needed for muscle spasms.   FUROSEMIDE (LASIX) 40 MG TABLET    TAKE 0.5 TABLETS (20 MG TOTAL) BY MOUTH DAILY.   GABAPENTIN (NEURONTIN) 600 MG TABLET    TAKE 1 TABLET (600 MG TOTAL) BY MOUTH 3 (THREE) TIMES DAILY.   KLOR-CON M20 20 MEQ TABLET    Take 20 mEq by mouth daily.    LOSARTAN (COZAAR) 50 MG TABLET    TAKE 1 TABLET (50 MG TOTAL) BY MOUTH DAILY.   METOPROLOL SUCCINATE (TOPROL-XL) 100 MG 24 HR TABLET    TAKE ONE TABLET BY MOUTH ONCE DAILY.   NASONEX 50 MCG/ACT NASAL SPRAY    PLACE 2 SPRAYS INTO THE NOSE DAILY.    POLYETHYLENE GLYCOL (MIRALAX / GLYCOLAX) PACKET    Take 17 g by mouth daily as needed for mild constipation (constipation).    SENNOSIDES-DOCUSATE SODIUM (SENOKOT-S) 8.6-50 MG TABLET    Take 1 tablet by mouth 2 (two) times daily.  Modified Medications   No medications on file  Discontinued Medications   GABAPENTIN (NEURONTIN) 600 MG TABLET    Take one tablet by mouth three times daily for pains.   KLOR-CON M20 20 MEQ TABLET    TAKE 2 TABLETS (40 MEQ TOTAL) BY MOUTH DAILY.   MOMETASONE (NASONEX) 50 MCG/ACT NASAL SPRAY    PLACE 2 SPRAYS INTO THE NOSE As needed.   SERTRALINE (ZOLOFT) 50 MG TABLET    TAKE 1 TABLET (50 MG TOTAL) BY MOUTH DAILY. TAKE DAILY TO CONTROL ANXIETY    Physical Exam: Filed Vitals:   10/27/14 1035  BP: 124/72  Pulse: 67  Temp: 98.2 F (36.8 C)  TempSrc: Oral  Resp: 16  Height: '5\' 7"'$  (1.702 m)  Weight: 177 lb (80.287 kg)  SpO2: 95%   Physical Exam  Labs reviewed: Basic Metabolic Panel:  Recent Labs  02/09/14 1132 07/24/14 0909  NA 137 140  K 4.6 4.5  CL 104 102  CO2  --  24  GLUCOSE 115* 96  BUN 19 19  CREATININE 0.80 0.84  CALCIUM  --  9.0   Liver Function Tests: No results for input(s): AST, ALT, ALKPHOS, BILITOT, PROT, ALBUMIN in the last 8760 hours. No results for input(s): LIPASE, AMYLASE in the last 8760 hours. No results for input(s): AMMONIA in the last 8760 hours. CBC:  Recent Labs  02/09/14 1132 07/24/14 0909  WBC  --  6.2  NEUTROABS  --  3.0  HGB 13.9  --   HCT 41.0 38.6   Lipid Panel:  Recent Labs  12/23/13 1019  CHOL 309*  HDL 75  LDLCALC 222*  TRIG 62  CHOLHDL 4.1   Lab Results  Component Value Date   HGBA1C 6.0* 10/03/2013    Procedures since last visit: She has been undergoing cartilage injections and specialized therapy at [facility] on [facility]  Assessment/Plan 1. Essential hypertension, benign - bp is at goal with current therapy including metoprolol succinate and losartan plus her lasix and  potassium--cont these  2. Hearing loss, bilateral -remains severe and we communicate with a dry erase board or a piece of paper and pen  3. Tricompartment degenerative joint disease of knee -left greater than right--cont her therapy and cartilage injections as per [facility]--she feels her steroid injection I gave  her lasted her several months and she's been much more active and steady using her cane -may continue tylenol as needed for pain--also has flexeril prescribed a year ago, but does not use it regularly  4. Hyperlipidemia -did not tolerate statins due to myopathy -lipids not at goal, but she is 81 yo and refuses further treatment of her cholesterol -she does try to eat properly but cannot resist some sweet desserts her son brings home  5. Edema -stable, uses compression hose as directed, lasix '20mg'$  with potassium supplement  6. Anemia, iron deficiency -last cbc in April was wnl -recheck at next appt  Labs/tests ordered:  No orders of the defined types were placed in this encounter.   Will do labs after appt as needed  Next appt:  4 mos for annual exam, labs day of visit  [place]. Rakan Soffer, D.O. [facility] [address], [place] [address] Cell Phone (Mon-Fri 8am-5pm):  [phone] On Call:  [phone] & follow prompts after 5pm & weekends Office Phone:  [phone] Office Fax:  [phone]

## 2014-11-09 ENCOUNTER — Other Ambulatory Visit: Payer: Self-pay | Admitting: Internal Medicine

## 2014-11-15 ENCOUNTER — Other Ambulatory Visit: Payer: Self-pay | Admitting: *Deleted

## 2014-11-15 MED ORDER — GABAPENTIN 600 MG PO TABS: ORAL_TABLET | ORAL | Status: DC

## 2014-12-06 ENCOUNTER — Encounter: Payer: Self-pay | Admitting: Podiatry

## 2014-12-06 ENCOUNTER — Ambulatory Visit (INDEPENDENT_AMBULATORY_CARE_PROVIDER_SITE_OTHER): Payer: Medicare Other | Admitting: Podiatry

## 2014-12-06 VITALS — BP 149/83 | HR 90

## 2014-12-06 DIAGNOSIS — M79606 Pain in leg, unspecified: Secondary | ICD-10-CM

## 2014-12-06 DIAGNOSIS — B351 Tinea unguium: Secondary | ICD-10-CM | POA: Diagnosis not present

## 2014-12-06 NOTE — Progress Notes (Signed)
Subjective:  81 y.o. year old female patient presents requesting toe nails trimmed.  Toes are stiff and hurting.   Objective: Dermatologic:  All nails are hypertrophic and elongated.  Cryptotic nail both great toe medial border symptomatic.  Vascular:  Dorsalis pedis arteries are not palpable on both.  Posterior tibial pulses are palpable on both.  Orthopedic:  Rectus foot with mild digital contracture right foot.  Neurologic:  Decreased sensory perception to Monofilament sensory testing bilateral.   Assessment:  Dystrophic mycotic nails x 10.  Ingrown nail both great toes.  Peripheral neuropathy.   Treatment: All mycotic nails debrided.  Return in 3 months or as needed.

## 2014-12-06 NOTE — Patient Instructions (Signed)
Seen for hypertrophic nails. All nails debrided. Return in 3 months or as needed.  

## 2014-12-14 ENCOUNTER — Encounter (HOSPITAL_COMMUNITY): Payer: Self-pay | Admitting: Emergency Medicine

## 2014-12-14 ENCOUNTER — Emergency Department (INDEPENDENT_AMBULATORY_CARE_PROVIDER_SITE_OTHER)
Admission: EM | Admit: 2014-12-14 | Discharge: 2014-12-14 | Disposition: A | Discharge status: Home / Self Care | Payer: Medicare Other | Source: Home / Self Care | Attending: Family Medicine | Admitting: Family Medicine

## 2014-12-14 DIAGNOSIS — R300 Dysuria: Secondary | ICD-10-CM | POA: Diagnosis not present

## 2014-12-14 LAB — POCT URINALYSIS DIP (DEVICE)
BILIRUBIN URINE: NEGATIVE
Glucose, UA: NEGATIVE mg/dL
Hgb urine dipstick: NEGATIVE
Ketones, ur: NEGATIVE mg/dL
LEUKOCYTES UA: NEGATIVE
Nitrite: NEGATIVE
Protein, ur: NEGATIVE mg/dL
Specific Gravity, Urine: 1.015 (ref 1.005–1.030)
UROBILINOGEN UA: 0.2 mg/dL (ref 0.0–1.0)
pH: 6.5 (ref 5.0–8.0)

## 2014-12-14 MED ORDER — CEPHALEXIN 500 MG PO CAPS: 500.0000 mg | ORAL_CAPSULE | Freq: Four times a day (QID) | ORAL | Status: DC

## 2014-12-14 NOTE — Discharge Instructions (Signed)

## 2014-12-14 NOTE — ED Notes (Signed)
Assisted to the toilet to obtain urine specimen.

## 2014-12-14 NOTE — ED Notes (Signed)
Concerned for bladder infection

## 2014-12-14 NOTE — ED Provider Notes (Signed)
CSN: [id number]     Arrival date & time 12/14/14  1307 History   First MD Initiated Contact with Patient 12/14/14 1440     Chief Complaint  Patient presents with  . Urinary Tract Infection   (Consider location/radiation/quality/duration/timing/severity/associated sxs/prior Treatment) HPI Comments: 81 year old female very hard of hearing is complaining of bladder symptoms. She is complaining of burning with urination and sometimes without urination as well as urinary frequency. She is also on a diarrhetic and is unable to distinguish between the increase in urination from taking the medication versus potentially having a bladder infection. Denies fever, pelvic pain. The onset was several days ago and I am unable to be more specific.  Patient is a 81 y.o. female presenting with urinary tract infection.  Urinary Tract Infection Associated symptoms: no flank pain     Past Medical History  Diagnosis Date  . History of hiatal hernia   . Mitral valve prolapse   . Plantar fasciitis   . Hay fever   . GERD (gastroesophageal reflux disease)   . Palpitations   . Neuromuscular disorder   . Deafness     "very HOH; even w/hearing aides we have to communicate via writing things down for her to read" (08/25/2012)  . H/O hiatal hernia   . Arthritis     "a little all over" (08/25/2012)  . Lung mass     Hx: of  . Anxiety   . Hemorrhoids   . Neuropathy   . Hypertension    Past Surgical History  Procedure Laterality Date  . Foot surgery Bilateral ~ 2000    "pinched nerve on feet" (08/25/2012)  . Vesicovaginal fistula closure w/ tah    . Abdominal hysterectomy  1998  . Tonsillectomy and adenoidectomy  1940's  . Cesarean section  1952  . Knee arthroscopy w/ meniscectomy Left 07/15/2010    partial medial and lateral meniscectomies.Archie Endo 07/15/2010 (08/25/2012)  . Hip arthroplasty Right 08/26/2012    Procedure: ARTHROPLASTY BIPOLAR HIP;  Surgeon: Mauri Pole, MD;  Location: [place];  Service:  Orthopedics;  Laterality: Right;  . Open reduction internal fixation (orif) distal radial fracture Right 09/11/2012    Procedure: OPEN REDUCTION INTERNAL FIXATION (ORIF) RIGHT DISTAL RADIAL FRACTURE/ULNAR POSSIBLE BONE GRAFTING;  Surgeon: Linna Hoff, MD;  Location: [place];  Service: Orthopedics;  Laterality: Right;   Family History  Problem Relation Age of Onset  . Colon cancer Mother   . Colon cancer Sister   . Cancer Sister     Breast  . Colon cancer Brother   . Heart disease Brother     Heart Attack  . Cancer Father     Leukemia   Social History  Substance Use Topics  . Smoking status: Never Smoker   . Smokeless tobacco: Never Used  . Alcohol Use: No   OB History    Gravida Para Term Preterm AB TAB SAB Ectopic Multiple Living   '2 2 2 '$ 0 0 0 0 0       Review of Systems  Constitutional: Negative.   Respiratory: Negative for cough and shortness of breath.   Cardiovascular: Negative for chest pain.  Gastrointestinal: Negative.   Genitourinary: Positive for dysuria and frequency. Negative for hematuria and flank pain.  Musculoskeletal: Positive for back pain and arthralgias.  Skin: Negative for rash.    Allergies  Statins and Aleve  Home Medications   Prior to Admission medications   Medication Sig Start Date End Date Taking? Authorizing Provider  acetaminophen (  TYLENOL) 325 MG tablet Take 325-650 mg by mouth every 4 (four) hours as needed for mild pain.    Historical Provider, MD  Calcium Glycerophosphate (PRELIEF PO) Take 65 mg by mouth daily as needed (as needed for indigestion).     Historical Provider, MD  cephALEXin (KEFLEX) 500 MG capsule Take 1 capsule (500 mg total) by mouth 4 (four) times daily. 12/14/14   Janne Napoleon, NP  cyclobenzaprine (FLEXERIL) 5 MG tablet Take 1 tablet (5 mg total) by mouth 3 (three) times daily as needed for muscle spasms. 10/03/13   Tiffany L Reed, DO  furosemide (LASIX) 20 MG tablet Take 1 tablet (20 mg total) by mouth daily. 10/27/14    Tiffany L Reed, DO  gabapentin (NEURONTIN) 600 MG tablet TAKE 1 TABLET (600 MG TOTAL) BY MOUTH 3 (THREE) TIMES DAILY. 11/15/14   Tiffany L Reed, DO  KLOR-CON M20 20 MEQ tablet Take 20 mEq by mouth daily.  01/11/13   Historical Provider, MD  losartan (COZAAR) 50 MG tablet TAKE 1 TABLET (50 MG TOTAL) BY MOUTH DAILY. 09/11/14   Tiffany L Reed, DO  metoprolol succinate (TOPROL-XL) 100 MG 24 hr tablet TAKE ONE TABLET BY MOUTH ONCE DAILY. 11/09/14   Tiffany L Reed, DO  NASONEX 50 MCG/ACT nasal spray PLACE 2 SPRAYS INTO THE NOSE DAILY. 08/08/14   Tiffany L Reed, DO  polyethylene glycol (MIRALAX / GLYCOLAX) packet Take 17 g by mouth daily as needed for mild constipation (constipation).     Historical Provider, MD  sennosides-docusate sodium (SENOKOT-S) 8.6-50 MG tablet Take 1 tablet by mouth 2 (two) times daily.    Historical Provider, MD   Meds Ordered and Administered this Visit  Medications - No data to display  BP 166/80 mmHg  Pulse 74  Temp(Src) 97.9 F (36.6 C) (Oral)  Resp 16  SpO2 96% No data found.   Physical Exam  Constitutional: She appears well-nourished. No distress.  Eyes: EOM are normal.  Neck: Normal range of motion. Neck supple.  Cardiovascular: Normal rate and regular rhythm.   Murmur heard. Pulmonary/Chest: Effort normal and breath sounds normal. No respiratory distress.  Abdominal: Soft. There is no tenderness. There is no rebound.  Lymphadenopathy:    She has no cervical adenopathy.  Neurological: She is alert. She exhibits normal muscle tone.  Skin: Skin is warm and dry.  Psychiatric: She has a normal mood and affect.  Nursing note and vitals reviewed.   ED Course  Procedures (including critical care time)  Labs Review Labs Reviewed  URINE CULTURE  POCT URINALYSIS DIP (DEVICE)   Results for orders placed or performed during the hospital encounter of 12/14/14  POCT urinalysis dip (device)  Result Value Ref Range   Glucose, UA NEGATIVE NEGATIVE mg/dL    Bilirubin Urine NEGATIVE NEGATIVE   Ketones, ur NEGATIVE NEGATIVE mg/dL   Specific Gravity, Urine 1.015 1.005 - 1.030   Hgb urine dipstick NEGATIVE NEGATIVE   pH 6.5 5.0 - 8.0   Protein, ur NEGATIVE NEGATIVE mg/dL   Urobilinogen, UA 0.2 0.0 - 1.0 mg/dL   Nitrite NEGATIVE NEGATIVE   Leukocytes, UA NEGATIVE NEGATIVE     Imaging Review No results found.   Visual Acuity Review  Right Eye Distance:   Left Eye Distance:   Bilateral Distance:    Right Eye Near:   Left Eye Near:    Bilateral Near:         MDM   1. Dysuria    Presumptive UTI. Keflex as dir.  Will culture urine If not improved in 3 days see your PCP    Janne Napoleon, NP 12/14/14 1516

## 2014-12-15 LAB — URINE CULTURE: Special Requests: NORMAL

## 2014-12-29 ENCOUNTER — Ambulatory Visit (INDEPENDENT_AMBULATORY_CARE_PROVIDER_SITE_OTHER): Payer: Medicare Other | Admitting: Internal Medicine

## 2014-12-29 ENCOUNTER — Encounter: Payer: Self-pay | Admitting: Internal Medicine

## 2014-12-29 VITALS — BP 118/82 | HR 90 | Temp 98.1°F | Resp 20 | Ht 67.0 in | Wt 176.6 lb

## 2014-12-29 DIAGNOSIS — H9193 Unspecified hearing loss, bilateral: Secondary | ICD-10-CM

## 2014-12-29 DIAGNOSIS — R300 Dysuria: Secondary | ICD-10-CM | POA: Diagnosis not present

## 2014-12-29 DIAGNOSIS — N952 Postmenopausal atrophic vaginitis: Secondary | ICD-10-CM | POA: Diagnosis not present

## 2014-12-29 LAB — POCT URINALYSIS DIPSTICK
Glucose, UA: NEGATIVE
PH UA: 5
PROTEIN UA: 30
RBC UA: NEGATIVE
SPEC GRAV UA: 1.01
Urobilinogen, UA: NEGATIVE

## 2014-12-29 MED ORDER — FUROSEMIDE 20 MG PO TABS: 20.0000 mg | ORAL_TABLET | Freq: Every day | ORAL | Status: DC

## 2014-12-29 MED ORDER — METRONIDAZOLE 500 MG PO TABS: 500.0000 mg | ORAL_TABLET | Freq: Two times a day (BID) | ORAL | Status: DC

## 2014-12-29 NOTE — Patient Instructions (Addendum)
Take all of medication to treat vaginal infection  You need to call the pharmacy and have them put your lasix '20mg'$  on automatic refill  Continue all other medications as ordered  Follow up with Dr Mariea Clonts as scheduled  Vaginitis Vaginitis is an inflammation of the vagina. It is most often caused by a change in the normal balance of the bacteria and yeast that live in the vagina. This change in balance causes an overgrowth of certain bacteria or yeast, which causes the inflammation. There are different types of vaginitis, but the most common types are:  Bacterial vaginosis.  Yeast infection (candidiasis).  Trichomoniasis vaginitis. This is a sexually transmitted infection (STI).  Viral vaginitis.  Atropic vaginitis.  Allergic vaginitis. CAUSES  The cause depends on the type of vaginitis. Vaginitis can be caused by:  Bacteria (bacterial vaginosis).  Yeast (yeast infection).  A parasite (trichomoniasis vaginitis)  A virus (viral vaginitis).  Low hormone levels (atrophic vaginitis). Low hormone levels can occur during pregnancy, breastfeeding, or after menopause.  Irritants, such as bubble baths, scented tampons, and feminine sprays (allergic vaginitis). Other factors can change the normal balance of the yeast and bacteria that live in the vagina. These include:  Antibiotic medicines.  Poor hygiene.  Diaphragms, vaginal sponges, spermicides, birth control pills, and intrauterine devices (IUD).  Sexual intercourse.  Infection.  Uncontrolled diabetes.  A weakened immune system. SYMPTOMS  Symptoms can vary depending on the cause of the vaginitis. Common symptoms include:  Abnormal vaginal discharge.  The discharge is white, gray, or yellow with bacterial vaginosis.  The discharge is thick, white, and cheesy with a yeast infection.  The discharge is frothy and yellow or greenish with trichomoniasis.  A bad vaginal odor.  The odor is fishy with bacterial  vaginosis.  Vaginal itching, pain, or swelling.  Painful intercourse.  Pain or burning when urinating. Sometimes, there are no symptoms. TREATMENT  Treatment will vary depending on the type of infection.   Bacterial vaginosis and trichomoniasis are often treated with antibiotic creams or pills.  Yeast infections are often treated with antifungal medicines, such as vaginal creams or suppositories.  Viral vaginitis has no cure, but symptoms can be treated with medicines that relieve discomfort. Your sexual partner should be treated as well.  Atrophic vaginitis may be treated with an estrogen cream, pill, suppository, or vaginal ring. If vaginal dryness occurs, lubricants and moisturizing creams may help. You may be told to avoid scented soaps, sprays, or douches.  Allergic vaginitis treatment involves quitting the use of the product that is causing the problem. Vaginal creams can be used to treat the symptoms. HOME CARE INSTRUCTIONS   Take all medicines as directed by your caregiver.  Keep your genital area clean and dry. Avoid soap and only rinse the area with water.  Avoid douching. It can remove the healthy bacteria in the vagina.  Do not use tampons or have sexual intercourse until your vaginitis has been treated. Use sanitary pads while you have vaginitis.  Wipe from front to back. This avoids the spread of bacteria from the rectum to the vagina.  Let air reach your genital area.  Wear cotton underwear to decrease moisture buildup.  Avoid wearing underwear while you sleep until your vaginitis is gone.  Avoid tight pants and underwear or nylons without a cotton panel.  Take off wet clothing (especially bathing suits) as soon as possible.  Use mild, non-scented products. Avoid using irritants, such as:  Scented feminine sprays.  Fabric softeners.  Scented detergents.  Scented tampons.  Scented soaps or bubble baths.  Practice safe sex and use condoms. Condoms  may prevent the spread of trichomoniasis and viral vaginitis. SEEK MEDICAL CARE IF:   You have abdominal pain.  You have a fever or persistent symptoms for more than 2-3 days.  You have a fever and your symptoms suddenly get worse. Document Released: 01/12/2007 Document Revised: 12/10/2011 Document Reviewed: 08/28/2011 [facility] Patient Information 2015 [place], [place]. This information is not intended to replace advice given to you by your health care provider. Make sure you discuss any questions you have with your health care provider.

## 2014-12-29 NOTE — Progress Notes (Signed)
Patient ID: [name], female   DOB: 08/02/1915, 81 y.o.   MRN: [id number]    Location:    [facility]   Place of Service:   OFFICE  Chief Complaint  Patient presents with  . Acute Visit    Pain in lower abdomen    HPI:  81 yo female seen today for abdominal pain. Sx's began 2 weeks ago with bladder infection sx's. She has subjective f/c, N/V. She was seen at [facility] UC and dx with presumed UTI. She was Rx keflex '500mg'$  QID and took it for 5 days only due to nausea. She could only take it 3 times daily. She reports irritation in her vaginal area. No d/c. She reports hx atrophic vaginitis that was tx without relief but this discomfort is different. She requests a further eval. Urine cx from UC revealed multiple species and repeat specimen suggested   She is extremely HOH and a more complete HPI difficult to obtain  Past Medical History  Diagnosis Date  . History of hiatal hernia   . Mitral valve prolapse   . Plantar fasciitis   . Hay fever   . GERD (gastroesophageal reflux disease)   . Palpitations   . Neuromuscular disorder   . Deafness     "very HOH; even w/hearing aides we have to communicate via writing things down for her to read" (08/25/2012)  . H/O hiatal hernia   . Arthritis     "a little all over" (08/25/2012)  . Lung mass     Hx: of  . Anxiety   . Hemorrhoids   . Neuropathy   . Hypertension     Past Surgical History  Procedure Laterality Date  . Foot surgery Bilateral ~ 2000    "pinched nerve on feet" (08/25/2012)  . Vesicovaginal fistula closure w/ tah    . Abdominal hysterectomy  1998  . Tonsillectomy and adenoidectomy  1940's  . Cesarean section  1952  . Knee arthroscopy w/ meniscectomy Left 07/15/2010    partial medial and lateral meniscectomies.Archie Endo 07/15/2010 (08/25/2012)  . Hip arthroplasty Right 08/26/2012    Procedure: ARTHROPLASTY BIPOLAR HIP;  Surgeon: Mauri Pole, MD;  Location: [place];  Service: Orthopedics;  Laterality: Right;  . Open reduction  internal fixation (orif) distal radial fracture Right 09/11/2012    Procedure: OPEN REDUCTION INTERNAL FIXATION (ORIF) RIGHT DISTAL RADIAL FRACTURE/ULNAR POSSIBLE BONE GRAFTING;  Surgeon: Linna Hoff, MD;  Location: [place];  Service: Orthopedics;  Laterality: Right;    Patient Care Team: Gayland Curry, DO as PCP - General (Geriatric Medicine) Anastasio Auerbach, MD as Consulting Physician (Gynecology)  Social History   Social History  . Marital Status: Unknown    Spouse Name: N/A  . Number of Children: N/A  . Years of Education: N/A   Occupational History  . Not on file.   Social History Main Topics  . Smoking status: Never Smoker   . Smokeless tobacco: Never Used  . Alcohol Use: No  . Drug Use: No  . Sexual Activity: No   Other Topics Concern  . Not on file   Social History Narrative   ** Merged History Encounter **       Married 10 years - widowed 1996   1 son, 1 daughter 2 grandsons   Lives in her own home, son lives with her. Tries to walk every day. Starting to get out more after ortho injury. I- ADLS.   End-of-life issues. DNR and no heroic or  extraordinary measures.   Walks with cane     reports that she has never smoked. She has never used smokeless tobacco. She reports that she does not drink alcohol or use illicit drugs.  Allergies  Allergen Reactions  . Statins Other (See Comments)    myopathy  . Aleve [Naproxen Sodium] Swelling    Legs and feet    Medications: Patient's Medications  New Prescriptions   METRONIDAZOLE (FLAGYL) 500 MG TABLET    Take 1 tablet (500 mg total) by mouth 2 (two) times daily.  Previous Medications   ACETAMINOPHEN (TYLENOL) 325 MG TABLET    Take 325-650 mg by mouth every 4 (four) hours as needed for mild pain.   CALCIUM GLYCEROPHOSPHATE (PRELIEF PO)    Take 65 mg by mouth daily as needed (as needed for indigestion).    GABAPENTIN (NEURONTIN) 600 MG TABLET    TAKE 1 TABLET (600 MG TOTAL) BY MOUTH 3 (THREE) TIMES DAILY.    KLOR-CON M20 20 MEQ TABLET    Take 20 mEq by mouth daily.    LOSARTAN (COZAAR) 50 MG TABLET    TAKE 1 TABLET (50 MG TOTAL) BY MOUTH DAILY.   METOPROLOL SUCCINATE (TOPROL-XL) 100 MG 24 HR TABLET    TAKE ONE TABLET BY MOUTH ONCE DAILY.   NASONEX 50 MCG/ACT NASAL SPRAY    PLACE 2 SPRAYS INTO THE NOSE DAILY.   POLYETHYLENE GLYCOL (MIRALAX / GLYCOLAX) PACKET    Take 17 g by mouth daily as needed for mild constipation (constipation).   Modified Medications   Modified Medication Previous Medication   FUROSEMIDE (LASIX) 20 MG TABLET furosemide (LASIX) 20 MG tablet      Take 1 tablet (20 mg total) by mouth daily.    Take 1 tablet (20 mg total) by mouth daily.  Discontinued Medications   CEPHALEXIN (KEFLEX) 500 MG CAPSULE    Take 1 capsule (500 mg total) by mouth 4 (four) times daily.   CYCLOBENZAPRINE (FLEXERIL) 5 MG TABLET    Take 1 tablet (5 mg total) by mouth 3 (three) times daily as needed for muscle spasms.   SENNOSIDES-DOCUSATE SODIUM (SENOKOT-S) 8.6-50 MG TABLET    Take 1 tablet by mouth 2 (two) times daily.    Review of Systems  Unable to perform ROS: Other  pt very HOH  Filed Vitals:   12/29/14 0931  BP: 118/82  Pulse: 90  Temp: 98.1 F (36.7 C)  TempSrc: Oral  Resp: 20  Height: '5\' 7"'$  (1.702 m)  Weight: 176 lb 9.6 oz (80.105 kg)  SpO2: 96%   Body mass index is 27.65 kg/(m^2).  Physical Exam  Constitutional: She appears well-developed and well-nourished. No distress.  HENT:  Very HOH  Cardiovascular: Normal rate, regular rhythm and intact distal pulses.  Exam reveals no gallop and no friction rub.   No murmur heard. Pulmonary/Chest: Effort normal and breath sounds normal. No respiratory distress. She has no wheezes. She has no rales. She exhibits no tenderness.  Abdominal: Soft. Bowel sounds are normal. She exhibits no distension and no mass. There is tenderness (suprapubic TTP but no r/g/r). There is no guarding.  Genitourinary:    Pelvic exam was performed with patient  supine. There is no rash on the right labia. There is no rash on the left labia. No vaginal discharge found.  Musculoskeletal: She exhibits edema and tenderness.  Neurological: She is alert.  Skin: Skin is warm and dry. No rash noted.  Psychiatric: She has a normal mood and affect.  Her behavior is normal. Thought content normal.     Labs reviewed: Admission on 12/14/2014, Discharged on 12/14/2014  Component Date Value Ref Range Status  . Glucose, UA 12/14/2014 NEGATIVE  NEGATIVE mg/dL Final  . Bilirubin Urine 12/14/2014 NEGATIVE  NEGATIVE Final  . Ketones, ur 12/14/2014 NEGATIVE  NEGATIVE mg/dL Final  . Specific Gravity, Urine 12/14/2014 1.015  1.005 - 1.030 Final  . Hgb urine dipstick 12/14/2014 NEGATIVE  NEGATIVE Final  . pH 12/14/2014 6.5  5.0 - 8.0 Final  . Protein, ur 12/14/2014 NEGATIVE  NEGATIVE mg/dL Final  . Urobilinogen, UA 12/14/2014 0.2  0.0 - 1.0 mg/dL Final  . Nitrite 12/14/2014 NEGATIVE  NEGATIVE Final  . Leukocytes, UA 12/14/2014 NEGATIVE  NEGATIVE Final   Biochemical Testing Only. Please order routine urinalysis from main lab if confirmatory testing is needed.  Marland Kitchen Specimen Description 12/14/2014 URINE, CLEAN CATCH   Final  . Special Requests 12/14/2014 Normal   Final  . Culture 12/14/2014 MULTIPLE SPECIES PRESENT, SUGGEST RECOLLECTION   Final  . Report Status 12/14/2014 12/15/2014 FINAL   Final    No results found.   Assessment/Plan   ICD-9-CM ICD-10-CM   1. Dysuria with probable BV 788.1 R30.0 POC Urinalysis Dipstick  2. Atrophic vaginitis 627.3 N95.2   3. Hearing loss, bilateral 389.9 H91.93     --check urine dipstick - moderate leukocytes; no nitrites; 30 protein; no sediment changes s/o UTI  --in light of clinical presentation and hx urinary incontinence, will treat empirically for bacterial vaginosis with flagyl '500mg'$  BID x 7 days  --she has tried premarin cream in the past for atrophic vaginitis but it was ineffective.  --cont other meds as  ordered  --f/u as scheduled with Dr Lorelei Pont S. Perlie Gold  [facility] [address] [place], [place] [address] [phone] Cell (Monday-Friday 8 AM - 5 PM) [phone] After 5 PM and follow prompts

## 2015-01-01 ENCOUNTER — Ambulatory Visit: Payer: Medicare Other | Admitting: Internal Medicine

## 2015-01-30 ENCOUNTER — Encounter: Payer: Self-pay | Admitting: Podiatry

## 2015-01-30 ENCOUNTER — Ambulatory Visit (INDEPENDENT_AMBULATORY_CARE_PROVIDER_SITE_OTHER): Payer: Medicare Other | Admitting: Podiatry

## 2015-01-30 VITALS — BP 169/83 | HR 85

## 2015-01-30 DIAGNOSIS — L600 Ingrowing nail: Secondary | ICD-10-CM

## 2015-01-30 DIAGNOSIS — M79604 Pain in right leg: Secondary | ICD-10-CM

## 2015-01-30 NOTE — Progress Notes (Signed)
Subjective:  81 y.o. year old female patient presents requesting toe nails trimmed.  Right great toe has been hurting in shoes. The toe and nail is brown from using Iodine solution on right great toe.  Patient is hard of hearing and need to use writing pad.   Objective: Dermatologic:  Painful toe right hallux in shoe. Hard incurvated nail right hallux. No active drainage or erythema noted. Long right hallux.  Friction callus at distal end of right great toe. Vascular:  Dorsalis pedis arteries are not palpable on both.  Posterior tibial pulses are palpable on both.  Orthopedic:  Rectus foot with mild digital contracture right foot.  Neurologic:  Decreased sensory perception to Monofilament sensory testing bilateral.   Assessment:  Ingrown nail right great toe painful in shoe. Long protruding right hallux with excess pressure from shoe. Peripheral neuropathy.   Treatment: All nails debrided.  Reviewed findings and advised to wear open toes shoes or surgical shoe while the toe is symptomatic. Return in 3 months or as needed.

## 2015-01-30 NOTE — Patient Instructions (Signed)
Seen for pain in right great toe. The great toe is long and hits in shoe, and causes pain. Please wear open toed shoe to relieve pressure to the great toe nail and tip of the bone. Return in 3 months or as needed.

## 2015-02-03 ENCOUNTER — Encounter (HOSPITAL_COMMUNITY): Payer: Self-pay

## 2015-02-03 ENCOUNTER — Emergency Department (HOSPITAL_COMMUNITY)
Admission: EM | Admit: 2015-02-03 | Discharge: 2015-02-03 | Disposition: A | Discharge status: Home / Self Care | Payer: Medicare Other | Attending: Emergency Medicine | Admitting: Emergency Medicine

## 2015-02-03 ENCOUNTER — Emergency Department (HOSPITAL_COMMUNITY): Payer: Medicare Other

## 2015-02-03 DIAGNOSIS — F419 Anxiety disorder, unspecified: Secondary | ICD-10-CM | POA: Diagnosis not present

## 2015-02-03 DIAGNOSIS — R61 Generalized hyperhidrosis: Secondary | ICD-10-CM | POA: Diagnosis not present

## 2015-02-03 DIAGNOSIS — R079 Chest pain, unspecified: Secondary | ICD-10-CM | POA: Insufficient documentation

## 2015-02-03 DIAGNOSIS — K219 Gastro-esophageal reflux disease without esophagitis: Secondary | ICD-10-CM | POA: Diagnosis not present

## 2015-02-03 DIAGNOSIS — Z8709 Personal history of other diseases of the respiratory system: Secondary | ICD-10-CM | POA: Diagnosis not present

## 2015-02-03 DIAGNOSIS — Z7951 Long term (current) use of inhaled steroids: Secondary | ICD-10-CM | POA: Diagnosis not present

## 2015-02-03 DIAGNOSIS — R42 Dizziness and giddiness: Secondary | ICD-10-CM | POA: Diagnosis not present

## 2015-02-03 DIAGNOSIS — Z79899 Other long term (current) drug therapy: Secondary | ICD-10-CM | POA: Insufficient documentation

## 2015-02-03 DIAGNOSIS — M549 Dorsalgia, unspecified: Secondary | ICD-10-CM | POA: Insufficient documentation

## 2015-02-03 DIAGNOSIS — M1990 Unspecified osteoarthritis, unspecified site: Secondary | ICD-10-CM | POA: Diagnosis not present

## 2015-02-03 DIAGNOSIS — I10 Essential (primary) hypertension: Secondary | ICD-10-CM | POA: Diagnosis not present

## 2015-02-03 DIAGNOSIS — H9190 Unspecified hearing loss, unspecified ear: Secondary | ICD-10-CM | POA: Insufficient documentation

## 2015-02-03 LAB — BASIC METABOLIC PANEL
Anion gap: 7 (ref 5–15)
BUN: 17 mg/dL (ref 6–20)
CHLORIDE: 95 mmol/L — AB (ref 101–111)
CO2: 24 mmol/L (ref 22–32)
CREATININE: 0.78 mg/dL (ref 0.44–1.00)
Calcium: 9 mg/dL (ref 8.9–10.3)
GFR calc Af Amer: >60 mL/min (ref >60)
GFR calc non Af Amer: >60 mL/min (ref >60)
GLUCOSE: 123 mg/dL — AB (ref 65–99)
Potassium: 4.7 mmol/L (ref 3.5–5.1)
SODIUM: 126 mmol/L — AB (ref 135–145)

## 2015-02-03 LAB — I-STAT TROPONIN, ED
TROPONIN I, POC: 0.01 ng/mL (ref 0.00–0.08)
Troponin i, poc: 0.01 ng/mL (ref 0.00–0.08)

## 2015-02-03 LAB — CBC
HEMATOCRIT: 39 % (ref 36.0–46.0)
Hemoglobin: 13.4 g/dL (ref 12.0–15.0)
MCH: 31.7 pg (ref 26.0–34.0)
MCHC: 34.4 g/dL (ref 30.0–36.0)
MCV: 92.2 fL (ref 78.0–100.0)
PLATELETS: 243 10*3/uL (ref 150–400)
RBC: 4.23 MIL/uL (ref 3.87–5.11)
RDW: 13.2 % (ref 11.5–15.5)
WBC: 11 10*3/uL — AB (ref 4.0–10.5)

## 2015-02-03 NOTE — ED Provider Notes (Signed)
CSN: [id number]     Arrival date & time 02/03/15  1018 History   First MD Initiated Contact with Patient 02/03/15 1020     Chief Complaint  Patient presents with  . Chest Pain     (Consider location/radiation/quality/duration/timing/severity/associated sxs/prior Treatment) Patient is a 81 y.o. female presenting with chest pain. The history is provided by the patient and a relative.  Chest Pain Associated symptoms: back pain and diaphoresis   Associated symptoms: no abdominal pain, no fever, no headache, no nausea, no shortness of breath and not vomiting    patient brought in by family members with complaint of left substernal chest pain for 3-4 days. Patient states that intensified at 2 in the morning and went away at 9 this morning. Associated with feeling lightheaded no true vertigo. Patient reportedly was diaphoretic upon arrival. Patient denies any shortness of breath or nausea or vomiting. Patient also with left upper back pain however that's been present for several weeks according to the son.  Past Medical History  Diagnosis Date  . History of hiatal hernia   . Mitral valve prolapse   . Plantar fasciitis   . Hay fever   . GERD (gastroesophageal reflux disease)   . Palpitations   . Neuromuscular disorder ([place])   . Deafness     "very HOH; even w/hearing aides we have to communicate via writing things down for her to read" (08/25/2012)  . H/O hiatal hernia   . Arthritis     "a little all over" (08/25/2012)  . Lung mass     Hx: of  . Anxiety   . Hemorrhoids   . Neuropathy ([place])   . Hypertension    Past Surgical History  Procedure Laterality Date  . Foot surgery Bilateral ~ 2000    "pinched nerve on feet" (08/25/2012)  . Vesicovaginal fistula closure w/ tah    . Abdominal hysterectomy  1998  . Tonsillectomy and adenoidectomy  1940's  . Cesarean section  1952  . Knee arthroscopy w/ meniscectomy Left 07/15/2010    partial medial and lateral meniscectomies.Archie Endo 07/15/2010  (08/25/2012)  . Hip arthroplasty Right 08/26/2012    Procedure: ARTHROPLASTY BIPOLAR HIP;  Surgeon: Mauri Pole, MD;  Location: [place];  Service: Orthopedics;  Laterality: Right;  . Open reduction internal fixation (orif) distal radial fracture Right 09/11/2012    Procedure: OPEN REDUCTION INTERNAL FIXATION (ORIF) RIGHT DISTAL RADIAL FRACTURE/ULNAR POSSIBLE BONE GRAFTING;  Surgeon: Linna Hoff, MD;  Location: [place];  Service: Orthopedics;  Laterality: Right;   Family History  Problem Relation Age of Onset  . Colon cancer Mother   . Colon cancer Sister   . Cancer Sister     Breast  . Colon cancer Brother   . Heart disease Brother     Heart Attack  . Cancer Father     Leukemia   Social History  Substance Use Topics  . Smoking status: Never Smoker   . Smokeless tobacco: Never Used  . Alcohol Use: No   OB History    Gravida Para Term Preterm AB TAB SAB Ectopic Multiple Living   '2 2 2 '$ 0 0 0 0 0       Review of Systems  Constitutional: Positive for diaphoresis. Negative for fever.  HENT: Negative for congestion.   Eyes: Negative for visual disturbance.  Respiratory: Negative for shortness of breath.   Cardiovascular: Positive for chest pain.  Gastrointestinal: Negative for nausea, vomiting and abdominal pain.  Genitourinary: Negative for dysuria.  Musculoskeletal: Positive for back pain.  Skin: Negative for rash.  Neurological: Positive for light-headedness. Negative for headaches.      Allergies  Statins and Aleve  Home Medications   Prior to Admission medications   Medication Sig Start Date End Date Taking? Authorizing Provider  acetaminophen (TYLENOL) 325 MG tablet Take 325-650 mg by mouth every 4 (four) hours as needed for mild pain.   Yes Historical Provider, MD  gabapentin (NEURONTIN) 600 MG tablet TAKE 1 TABLET (600 MG TOTAL) BY MOUTH 3 (THREE) TIMES DAILY. 11/15/14  Yes Tiffany L Reed, DO  KLOR-CON M20 20 MEQ tablet Take 20 mEq by mouth daily.  01/11/13  Yes  Historical Provider, MD  losartan (COZAAR) 50 MG tablet TAKE 1 TABLET (50 MG TOTAL) BY MOUTH DAILY. 09/11/14  Yes Tiffany L Reed, DO  metoprolol succinate (TOPROL-XL) 100 MG 24 hr tablet TAKE ONE TABLET BY MOUTH ONCE DAILY. 11/09/14  Yes Tiffany L Reed, DO  NASONEX 50 MCG/ACT nasal spray PLACE 2 SPRAYS INTO THE NOSE DAILY. Patient taking differently: as needed 08/08/14  Yes Tiffany L Reed, DO  omeprazole (PRILOSEC) 20 MG capsule Take 20 mg by mouth daily.   Yes Historical Provider, MD  polyethylene glycol (MIRALAX / GLYCOLAX) packet Take 17 g by mouth daily as needed for mild constipation (constipation).    Yes Historical Provider, MD  Calcium Glycerophosphate (PRELIEF PO) Take 65 mg by mouth daily as needed (as needed for indigestion).     Historical Provider, MD  furosemide (LASIX) 20 MG tablet Take 1 tablet (20 mg total) by mouth daily. Patient not taking: Reported on 02/03/2015 12/29/14   Gildardo Cranker, DO   BP 152/76 mmHg  Pulse 72  Temp(Src) 98.6 F (37 C) (Oral)  Resp 12  Ht '5\' 4"'$  (1.626 m)  Wt 175 lb (79.379 kg)  BMI 30.02 kg/m2  SpO2 97% Physical Exam  Constitutional: She is oriented to person, place, and time. She appears well-developed and well-nourished. No distress.  HENT:  Head: Normocephalic and atraumatic.  Eyes: Conjunctivae and EOM are normal. Pupils are equal, round, and reactive to light.  Neck: Normal range of motion. Neck supple.  Cardiovascular: Normal rate, regular rhythm and normal heart sounds.   No murmur heard. Pulmonary/Chest: Effort normal and breath sounds normal. No respiratory distress.  Abdominal: Soft. Bowel sounds are normal. There is no tenderness.  Musculoskeletal: Normal range of motion. She exhibits no edema.  Neurological: She is alert and oriented to person, place, and time. No cranial nerve deficit. She exhibits normal muscle tone. Coordination normal.  Normal no exam except for very hard of hearing.  Skin: Skin is warm. No rash noted.  Nursing  note and vitals reviewed.   ED Course  Procedures (including critical care time) Labs Review Labs Reviewed  BASIC METABOLIC PANEL - Abnormal; Notable for the following:    Sodium 126 (*)    Chloride 95 (*)    Glucose, Bld 123 (*)    All other components within normal limits  CBC - Abnormal; Notable for the following:    WBC 11.0 (*)    All other components within normal limits  I-STAT TROPOININ, ED  I-STAT TROPOININ, ED   Results for orders placed or performed during the hospital encounter of 89/38/10  Basic metabolic panel  Result Value Ref Range   Sodium 126 (L) 135 - 145 mmol/L   Potassium 4.7 3.5 - 5.1 mmol/L   Chloride 95 (L) 101 - 111 mmol/L   CO2 24  22 - 32 mmol/L   Glucose, Bld 123 (H) 65 - 99 mg/dL   BUN 17 6 - 20 mg/dL   Creatinine, Ser 0.78 0.44 - 1.00 mg/dL   Calcium 9.0 8.9 - 10.3 mg/dL   GFR calc non Af Amer >60 >60 mL/min   GFR calc Af Amer >60 >60 mL/min   Anion gap 7 5 - 15  CBC  Result Value Ref Range   WBC 11.0 (H) 4.0 - 10.5 K/uL   RBC 4.23 3.87 - 5.11 MIL/uL   Hemoglobin 13.4 12.0 - 15.0 g/dL   HCT 39.0 36.0 - 46.0 %   MCV 92.2 78.0 - 100.0 fL   MCH 31.7 26.0 - 34.0 pg   MCHC 34.4 30.0 - 36.0 g/dL   RDW 13.2 11.5 - 15.5 %   Platelets 243 150 - 400 K/uL  I-stat troponin, ED (not at [facility])  Result Value Ref Range   Troponin i, poc 0.01 0.00 - 0.08 ng/mL   Comment 3          I-Stat Troponin, ED (not at [facility])  Result Value Ref Range   Troponin i, poc 0.01 0.00 - 0.08 ng/mL   Comment 3             Imaging Review Dg Chest 2 View  02/03/2015  CLINICAL DATA:  Left chest pain since last night. Back pain for 1 week. History of mitral valve prolapse. EXAM: CHEST  2 VIEW COMPARISON:  Previous radiographs and CT examinations. FINDINGS: Stable mildly enlarged cardiac silhouette and prominent pulmonary vasculature and interstitial markings. A left apical mass appears similar to the CT dated 01/18/2014. Costal cartilage calcification is again  demonstrated overlying the right lung base. Diffuse osteopenia. Mild scoliosis. IMPRESSION: 1. Grossly stable left apical mass concerning for a lung neoplasm. 2. Stable mild cardiomegaly, pulmonary vascular congestion and chronic interstitial lung disease. Electronically Signed   By: Claudie Revering M.D.   On: 02/03/2015 11:20   I have personally reviewed and evaluated these images and lab results as part of my medical decision-making.   EKG Interpretation   Date/Time:  Saturday February 03 2015 10:30:26 EDT Ventricular Rate:  80 PR Interval:  167 QRS Duration: 81 QT Interval:  384 QTC Calculation: 443 R Axis:   55 Text Interpretation:  Sinus rhythm Confirmed by Dmarcus Decicco  MD, Bryton Waight  ([id number]) on 02/03/2015 10:40:45 AM      MDM   Final diagnoses:  Chest pain, unspecified chest pain type    The patient with left-sided chest pain or substernal began 3-4 days ago started more intense early this morning at about 2 in the morning and was completely gone by 9 in the morning. Patient did not have any recurrence of it. Patient was noted to be diaphoretic upon arrival. Patient states that the pain radiates to left upper back that's been present for for longer than the 3-4 days of the chest pain.  Chest x-ray showed evidence of a left apical lung mass has been present for over a year no significant change in size. Patient's son was aware of that finding. Troponins 2 were negative. EKG without any acute changes at all very normal EKG. Mild leukocytosis no significant anemia no significant electrolyte abnormalities. Other than hyponatremia sodium of 126. The patient very functional here. This will need to be followed. They hyponatremia is probably related to patient's treatment for Lasix.  Patient's oxygen saturations are normal 96-97%. The patient received IV normal saline.  Patient has follow-up  with her primary care doctor as well as referral now to cardiology for the chest pain.    Fredia Sorrow, MD 02/03/15 [phone]

## 2015-02-03 NOTE — Discharge Instructions (Signed)
Make an appointment to follow-up with cardiology. Information and phone number provided. Today's workup without any explanation for the chest pain. No evidence of a heart attack. The chest x-ray shows evidence of a lung mass is been present for over a year. It appears that should doctors are aware of that. It has not changed at all. It is stable.  Return for any new or worse symptoms.

## 2015-02-03 NOTE — ED Notes (Signed)
Pt reports left sided chest pain and "feeling woozy" that began 3-4 days ago.  Pt is diaphoretic upon arrival.  Pt reports pain radiates to left upper back.

## 2015-02-05 ENCOUNTER — Telehealth: Payer: Self-pay

## 2015-02-05 NOTE — Telephone Encounter (Signed)
Called patients son's phone number got no answer and answering machine not set up will try again later.

## 2015-02-06 ENCOUNTER — Telehealth: Payer: Self-pay

## 2015-02-06 NOTE — Telephone Encounter (Signed)
Patients son called the office back returning my call, informed him of what the doctor had said in her notes he was in agreement with her.

## 2015-02-06 NOTE — Telephone Encounter (Signed)
Please call Meylin's son. At her age, I don't know what the benefit of her seeing a cardiologist is. She has a lung mass which probably is why she has chest pain. I now she wants no invasive procedures whatsoever. Cardiology just does not make sense at this time. If they would like, I can discuss this further with them at an appointment.     Called patients home phone machine still not set up.Left message on machine for patients son to return call when available.

## 2015-02-08 ENCOUNTER — Encounter: Payer: Self-pay | Admitting: Internal Medicine

## 2015-02-08 ENCOUNTER — Telehealth: Payer: Self-pay | Admitting: *Deleted

## 2015-02-08 ENCOUNTER — Ambulatory Visit (INDEPENDENT_AMBULATORY_CARE_PROVIDER_SITE_OTHER): Payer: Medicare Other | Admitting: Internal Medicine

## 2015-02-08 VITALS — BP 146/90 | HR 95 | Temp 97.9°F | Ht 64.0 in | Wt 172.0 lb

## 2015-02-08 DIAGNOSIS — F411 Generalized anxiety disorder: Secondary | ICD-10-CM | POA: Diagnosis not present

## 2015-02-08 DIAGNOSIS — R911 Solitary pulmonary nodule: Secondary | ICD-10-CM | POA: Diagnosis not present

## 2015-02-08 DIAGNOSIS — R072 Precordial pain: Secondary | ICD-10-CM

## 2015-02-08 DIAGNOSIS — K219 Gastro-esophageal reflux disease without esophagitis: Secondary | ICD-10-CM

## 2015-02-08 DIAGNOSIS — E871 Hypo-osmolality and hyponatremia: Secondary | ICD-10-CM

## 2015-02-08 DIAGNOSIS — R600 Localized edema: Secondary | ICD-10-CM

## 2015-02-08 NOTE — Progress Notes (Signed)
Patient ID: [name], female   DOB: [birth year], 81 y.o.   MRN: [id number]   Location: [place] Provider: Rexene Edison. Mariea Clonts, D.O., C.M.D.  Code Status: DNR Goals of Care: Advanced Directive information Does patient have an advance directive?: Yes, Type of Advance Directive: [place];Living will, Does patient want to make changes to advanced directive?: No - Patient declined  Chief Complaint  Patient presents with  . Follow-up    ED follow-up, patient with ongoing stomach pains that radiate up into her chest.   . Medication Management    Discuss restarting Zoloft     HPI: Patient is a 81 y.o. female seen in the office today for ED f/u after she was there on 11/5 with "chest pain".  She was woozy and having left-sided chest pain for 3-4 days per the ED notes which I reviewed.  She was diaphoretic upon arrival and it was radiating to her left upper back.  Her son reported that the back pain had been present for several weeks.  Her CXR still showed the left apical lung mass that pt did not want addressed.  Troponins were negative x 2.  EKG was without acute changes and normal.  She had mild leukocytosis and mild hyponatremia at 126.  She is on the lasix for her edema which she adamantly requested after I had deferred the request for fear of electrolyte abnormalities for months.  She only takes the lasix prn. Sats were normal. She thinks her chest pain was due to the debate and elections.  She also had taken herself off prilosec.  Was taking a supplement for her reflux.  It seems better now.    CMA notes today that pt is very anxious.  Her bp is up a little bit.  She lost her notes where she wrote her questions for me today.  Wants to restart zoloft for her anxiety.  She is tremulous.    Review of Systems:  Review of Systems  Constitutional: Positive for malaise/fatigue and diaphoresis. Negative for fever and chills.  HENT: Positive for hearing loss. Negative  for congestion.        We must communicate on paper or with dry erase board due to near deafness  Eyes: Negative for blurred vision.       Glasses  Respiratory: Negative for cough, shortness of breath and wheezing.   Cardiovascular: Positive for chest pain. Negative for palpitations and leg swelling.  Gastrointestinal: Negative for abdominal pain, constipation, blood in stool and melena.  Genitourinary: Negative for dysuria.  Musculoskeletal: Negative for falls.  Skin: Negative for rash.  Neurological: Positive for tingling, tremors and sensory change. Negative for dizziness and weakness.       Neuropathy in feet--on gabapentin with benefit  Psychiatric/Behavioral: Positive for depression. Negative for memory loss. The patient is nervous/anxious. The patient does not have insomnia.     Past Medical History  Diagnosis Date  . History of hiatal hernia   . Mitral valve prolapse   . Plantar fasciitis   . Hay fever   . GERD (gastroesophageal reflux disease)   . Palpitations   . Neuromuscular disorder ([place])   . Deafness     "very HOH; even w/hearing aides we have to communicate via writing things down for her to read" (08/25/2012)  . H/O hiatal hernia   . Arthritis     "a little all over" (08/25/2012)  . Lung mass     Hx: of  . Anxiety   .  Hemorrhoids   . Neuropathy ([place])   . Hypertension     Past Surgical History  Procedure Laterality Date  . Foot surgery Bilateral ~ 2000    "pinched nerve on feet" (08/25/2012)  . Vesicovaginal fistula closure w/ tah    . Abdominal hysterectomy  1998  . Tonsillectomy and adenoidectomy  1940's  . Cesarean section  1952  . Knee arthroscopy w/ meniscectomy Left 07/15/2010    partial medial and lateral meniscectomies.Archie Endo 07/15/2010 (08/25/2012)  . Hip arthroplasty Right 08/26/2012    Procedure: ARTHROPLASTY BIPOLAR HIP;  Surgeon: Mauri Pole, MD;  Location: [facility];  Service: Orthopedics;  Laterality: Right;  . Open reduction internal fixation  (orif) distal radial fracture Right 09/11/2012    Procedure: OPEN REDUCTION INTERNAL FIXATION (ORIF) RIGHT DISTAL RADIAL FRACTURE/ULNAR POSSIBLE BONE GRAFTING;  Surgeon: Linna Hoff, MD;  Location: [facility];  Service: Orthopedics;  Laterality: Right;    Allergies  Allergen Reactions  . Statins Other (See Comments)    myopathy  . Aleve [Naproxen Sodium] Swelling    Legs and feet      Medication List       This list is accurate as of: 02/08/15 10:41 AM.  Always use your most recent med list.               acetaminophen 325 MG tablet  Commonly known as:  TYLENOL  Take 325-650 mg by mouth every 4 (four) hours as needed for mild pain.     furosemide 20 MG tablet  Commonly known as:  LASIX  Take 20 mg by mouth as needed.     gabapentin 600 MG tablet  Commonly known as:  NEURONTIN  TAKE 1 TABLET (600 MG TOTAL) BY MOUTH 3 (THREE) TIMES DAILY.     KLOR-CON M20 20 MEQ tablet  Generic drug:  potassium chloride SA  Take 20 mEq by mouth daily.     losartan 50 MG tablet  Commonly known as:  COZAAR  TAKE 1 TABLET (50 MG TOTAL) BY MOUTH DAILY.     metoprolol succinate 100 MG 24 hr tablet  Commonly known as:  TOPROL-XL  TAKE ONE TABLET BY MOUTH ONCE DAILY.     mometasone 50 MCG/ACT nasal spray  Commonly known as:  NASONEX  Place 1 spray into the nose as needed.     omeprazole 20 MG capsule  Commonly known as:  PRILOSEC  Take 20 mg by mouth daily.     polyethylene glycol packet  Commonly known as:  MIRALAX / GLYCOLAX  Take 17 g by mouth daily as needed for mild constipation (constipation).     PRELIEF PO  Take 65 mg by mouth daily as needed (as needed for indigestion).        Health Maintenance  Topic Date Due  . INFLUENZA VACCINE  10/30/2014  . DEXA SCAN  03/31/2018 (Originally 10-07-201984)  . TETANUS/TDAP  11/09/2023  . ZOSTAVAX  Completed  . PNA vac Low Risk Adult  Completed    Physical Exam: Filed Vitals:   02/08/15 1023  BP: 146/90  Pulse: 95  Temp: 97.9  F (36.6 C)  TempSrc: Oral  Height: '5\' 4"'$  (1.626 m)  Weight: 172 lb (78.019 kg)  SpO2: 95%   Body mass index is 29.51 kg/(m^2). Physical Exam  Constitutional: She is oriented to person, place, and time. She appears well-developed and well-nourished.  HENT:  Extremely HOH  Eyes:  glasses  Cardiovascular: Normal rate, regular rhythm, normal heart sounds and intact  distal pulses.   Pulmonary/Chest: Effort normal and breath sounds normal. No respiratory distress.  Abdominal: Soft. Bowel sounds are normal. She exhibits no distension and no mass. There is no tenderness. There is no rebound and no guarding.  Musculoskeletal: Normal range of motion. She exhibits no edema or tenderness.  Uses cane some for balance  Neurological: She is alert and oriented to person, place, and time.  Skin: She is diaphoretic.  Psychiatric:  Anxious, jittery and tremulous    Labs reviewed: Basic Metabolic Panel:  Recent Labs  02/09/14 1132 07/24/14 0909 02/03/15 1046  NA 137 140 126*  K 4.6 4.5 4.7  CL 104 102 95*  CO2  --  24 24  GLUCOSE 115* 96 123*  BUN '19 19 17  '$ CREATININE 0.80 0.84 0.78  CALCIUM  --  9.0 9.0   Liver Function Tests: No results for input(s): AST, ALT, ALKPHOS, BILITOT, PROT, ALBUMIN in the last 8760 hours. No results for input(s): LIPASE, AMYLASE in the last 8760 hours. No results for input(s): AMMONIA in the last 8760 hours. CBC:  Recent Labs  02/09/14 1132 07/24/14 0909 02/03/15 1046  WBC  --  6.2 11.0*  NEUTROABS  --  3.0  --   HGB 13.9  --  13.4  HCT 41.0 38.6 39.0  MCV  --   --  92.2  PLT  --   --  243   Lipid Panel: No results for input(s): CHOL, HDL, LDLCALC, TRIG, CHOLHDL, LDLDIRECT in the last 8760 hours. Lab Results  Component Value Date   HGBA1C 6.0* 10/03/2013   Assessment/Plan 1. Solitary pulmonary nodule on lung CT -again reviewed with patient and she does not want anything done about this -suspect hyponatremia may be SIADH related to this  mass -advised to quit the lasix and will reassess Na - Basic metabolic panel - CBC with Differential/Platelet  2. Hyponatremia - due to diuretic vs. Lung mass with siadh - Basic metabolic panel  3. Substernal chest pain -etiology at ED was unclear -may be due to #1 vs. Anxiety--pt had stopped her zoloft and now wants to restart so we will at '50mg'$   4. Gastroesophageal reflux disease, esophagitis presence not specified -is on prilosec '20mg'$  daily and does not think her symptoms are related to this and not based on her history/presentation  5. Localized edema -pt not to take lasix and kcl and see how she does without them -if she has improvement in Na, this may be the cause--she uses it only for swelling not any known chf -advised to elevate feet at rest and use compression hose when up and about  6. Anxiety state -resume zoloft '50mg'$  daily which had been working well, but pt had requested to discontinue  Labs/tests ordered:   Orders Placed This Encounter  Procedures  . Basic metabolic panel  . CBC with Differential/Platelet    Next appt: 03/12/2015   Mariellen Blaney L. Taquilla Downum, D.O. [facility] [address], [place] [address] Cell Phone (Mon-Fri 8am-5pm):  [phone] On Call:  [phone] & follow prompts after 5pm & weekends Office Phone:  [phone] Office Fax:  [phone]

## 2015-02-08 NOTE — Patient Instructions (Signed)
Keep taking your prilosec.  Continue the zoloft you are taking--call back with the dosage (is it '50mg'$ ?).  This should help your anxiety.  We checked your sodium today.  Take the lasix only if your legs get very swollen.

## 2015-02-08 NOTE — Telephone Encounter (Signed)
Great, thanks

## 2015-02-08 NOTE — Telephone Encounter (Signed)
Patient son called and stated that you wanted him to call back with the mg of the Zoloft she takes and he stated that she takes Zoloft '50mg'$ .

## 2015-02-09 LAB — CBC WITH DIFFERENTIAL/PLATELET
Basophils Absolute: 0 10*3/uL (ref 0.0–0.2)
Basos: 0 %
EOS (ABSOLUTE): 0.1 10*3/uL (ref 0.0–0.4)
Eos: 1 %
Hematocrit: 38.5 % (ref 34.0–46.6)
Hemoglobin: 13.5 g/dL (ref 11.1–15.9)
Immature Grans (Abs): 0 10*3/uL (ref 0.0–0.1)
Immature Granulocytes: 0 %
Lymphocytes Absolute: 1.5 10*3/uL (ref 0.7–3.1)
Lymphs: 17 %
MCH: 31.8 pg (ref 26.6–33.0)
MCHC: 35.1 g/dL (ref 31.5–35.7)
MCV: 91 fL (ref 79–97)
Monocytes Absolute: 0.9 10*3/uL (ref 0.1–0.9)
Monocytes: 10 %
Neutrophils Absolute: 6.4 10*3/uL (ref 1.4–7.0)
Neutrophils: 72 %
Platelets: 297 10*3/uL (ref 150–379)
RBC: 4.25 x10E6/uL (ref 3.77–5.28)
RDW: 13 % (ref 12.3–15.4)
WBC: 8.9 10*3/uL (ref 3.4–10.8)

## 2015-02-09 LAB — BASIC METABOLIC PANEL
BUN/Creatinine Ratio: 27 — ABNORMAL HIGH (ref 11–26)
BUN: 21 mg/dL (ref 10–36)
CO2: 21 mmol/L (ref 18–29)
Calcium: 9.5 mg/dL (ref 8.7–10.3)
Chloride: 91 mmol/L — ABNORMAL LOW (ref 97–106)
Creatinine, Ser: 0.79 mg/dL (ref 0.57–1.00)
GFR calc Af Amer: 73 mL/min/{1.73_m2} (ref >59)
GFR calc non Af Amer: 63 mL/min/{1.73_m2} (ref >59)
Glucose: 113 mg/dL — ABNORMAL HIGH (ref 65–99)
Potassium: 5.4 mmol/L — ABNORMAL HIGH (ref 3.5–5.2)
Sodium: 128 mmol/L — ABNORMAL LOW (ref 136–144)

## 2015-02-12 ENCOUNTER — Telehealth: Payer: Self-pay | Admitting: *Deleted

## 2015-02-12 DIAGNOSIS — I10 Essential (primary) hypertension: Secondary | ICD-10-CM

## 2015-02-12 DIAGNOSIS — R208 Other disturbances of skin sensation: Secondary | ICD-10-CM

## 2015-02-12 DIAGNOSIS — E871 Hypo-osmolality and hyponatremia: Secondary | ICD-10-CM

## 2015-02-12 NOTE — Telephone Encounter (Signed)
Let's have her come in for labs:  B12/folate, TSH, hba1c to assess for reasons for the burning pain in her feet.   She is already on quite a bit of gabapentin.  Please find out if she's taken pregabalin (lyrica) also.  That might be an alternative to the gabapentin.

## 2015-02-12 NOTE — Telephone Encounter (Signed)
Patient son called and stated that patient is having foot pain and its very painful. Sharp, burning pain. Has an appointment with the foot Dr. In 3 weeks on [place]. Would like to know what she can do while she's waiting for the appointment. Please Advise.

## 2015-02-13 ENCOUNTER — Other Ambulatory Visit: Payer: Medicare Other

## 2015-02-13 DIAGNOSIS — I10 Essential (primary) hypertension: Secondary | ICD-10-CM

## 2015-02-13 DIAGNOSIS — R208 Other disturbances of skin sensation: Secondary | ICD-10-CM

## 2015-02-13 DIAGNOSIS — E871 Hypo-osmolality and hyponatremia: Secondary | ICD-10-CM

## 2015-02-13 NOTE — Telephone Encounter (Signed)
Patient son notified and agreed and will bring her by for labs today. Order placed for labwork. Patient is not taking Lyrica.

## 2015-02-13 NOTE — Telephone Encounter (Signed)
Patient was here in the lab with her daughter [name]), [name] would like to know if patient can try celebrex for foot pain. Patient with no history of DM or other conditions that would cause neuropathy.  Please advise   [phone] (Contact number for [place])

## 2015-02-14 LAB — HEMOGLOBIN A1C
Est. average glucose Bld gHb Est-mCnc: 131 mg/dL
Hgb A1c MFr Bld: 6.2 % — ABNORMAL HIGH (ref 4.8–5.6)

## 2015-02-14 LAB — TSH: TSH: 1.29 u[IU]/mL (ref 0.450–4.500)

## 2015-02-14 LAB — FOLATE: Folate: >20 ng/mL (ref >3.0)

## 2015-02-14 LAB — VITAMIN B12: Vitamin B-12: 1439 pg/mL — ABNORMAL HIGH (ref 211–946)

## 2015-02-14 NOTE — Telephone Encounter (Signed)
Per Dr. Reed--D/C gabapentin/neurontin. Begin Lyrica '75mg'$  po bid. If we have samples that size, she can try them first before filling a prescription. It's controlled so we'll have to print and fax it or have her son pick it up.  Printed message, Gay Filler calling patient daughter [name] and will give her instructions. Gay Filler handled message.

## 2015-02-14 NOTE — Telephone Encounter (Signed)
You recommended Lyrica and not Celebrex. What mg of Lyrica and direction? Please Advise.

## 2015-02-15 ENCOUNTER — Telehealth: Payer: Self-pay | Admitting: *Deleted

## 2015-02-15 DIAGNOSIS — M25579 Pain in unspecified ankle and joints of unspecified foot: Secondary | ICD-10-CM

## 2015-02-15 MED ORDER — PREGABALIN 75 MG PO CAPS: 75.0000 mg | ORAL_CAPSULE | Freq: Two times a day (BID) | ORAL | Status: DC

## 2015-02-15 NOTE — Telephone Encounter (Signed)
Spoke with patient's daughter regarding medication for her foot pain, I informed her that Dr. Mariea Clonts wants her to start on Lyrica 75 mg twice daily for prediabetic condition. The daughter stated that she was unaware that her mother had diabetes, but she will make sure that her brother picks up her medication and she will talk with Dr. Mariea Clonts at her next appointment.

## 2015-02-16 ENCOUNTER — Telehealth: Payer: Self-pay

## 2015-02-16 DIAGNOSIS — M25579 Pain in unspecified ankle and joints of unspecified foot: Secondary | ICD-10-CM

## 2015-02-16 NOTE — Telephone Encounter (Signed)
Pt came into office with her son and lyrica was discontinued.  Pt requested to see her neurologist in f/u and was provided with the phone number by Ivin Booty RMA.  There was some mention of the lung nodule causing her pain now which she has not mentioned at all during visits.  Apparently she wanted to see her cardiovascular surgeon about this.  If she is continuing to have problems, she will need an appointment with someone with her who is able to help her with her instructions.

## 2015-02-16 NOTE — Telephone Encounter (Signed)
Patient stated that she would like to see an neurologist regarding her foot pain, informed her that Dr. Cala Bradford the referral.

## 2015-02-16 NOTE — Telephone Encounter (Signed)
Patient's daughter left message on triage voicemail: patient unable to tolerate lyrica (made patient sick). Patient with general weakness (unable to sleep on right side)  Please advise

## 2015-02-17 ENCOUNTER — Other Ambulatory Visit: Payer: Self-pay | Admitting: Internal Medicine

## 2015-02-17 NOTE — Progress Notes (Signed)
When pt and son came to office and said she did not tolerate lyrica, she was advised to stop it and that allergy would be added to list.  This was now completed.  Patient's daughter should be notified that patient may resume her gabapentin (still on list) for her neuropathy and that neuropathy does not mean diabetes, but her sugars are borderline.  This neuropathy can be for an unknown cause in many older patients.  All of her labs to work it up were unremarkable.  I see she's had it for years per Dr. Linda Hedges' notes.

## 2015-02-19 ENCOUNTER — Telehealth: Payer: Self-pay | Admitting: *Deleted

## 2015-02-19 NOTE — Telephone Encounter (Signed)
Spoke with patient's daughter regarding her mother's medications lyrica and gabapentin, she stated that her mother did start the lyrica over the weekend and is feeling better. She also stated that her mother need to know if she should be taking the gabapentin with the lyrica and per Dr. Mariea Clonts the answer was no, do not take both just take the lyrica only until her Dec.appointment and they will discuss any changes at that time.

## 2015-02-27 ENCOUNTER — Ambulatory Visit: Payer: Medicare Other | Admitting: Podiatry

## 2015-02-28 ENCOUNTER — Other Ambulatory Visit: Payer: Self-pay | Admitting: *Deleted

## 2015-02-28 MED ORDER — PREGABALIN 75 MG PO CAPS: 75.0000 mg | ORAL_CAPSULE | Freq: Two times a day (BID) | ORAL | Status: DC

## 2015-02-28 NOTE — Telephone Encounter (Signed)
Patient requested refill

## 2015-03-01 ENCOUNTER — Encounter (HOSPITAL_COMMUNITY): Payer: Self-pay | Admitting: Neurology

## 2015-03-01 ENCOUNTER — Emergency Department (HOSPITAL_COMMUNITY): Payer: Medicare Other

## 2015-03-01 ENCOUNTER — Emergency Department (HOSPITAL_COMMUNITY)
Admission: EM | Admit: 2015-03-01 | Discharge: 2015-03-01 | Disposition: A | Discharge status: Home / Self Care | Payer: Medicare Other | Attending: Emergency Medicine | Admitting: Emergency Medicine

## 2015-03-01 DIAGNOSIS — R109 Unspecified abdominal pain: Secondary | ICD-10-CM

## 2015-03-01 DIAGNOSIS — K8020 Calculus of gallbladder without cholecystitis without obstruction: Secondary | ICD-10-CM | POA: Insufficient documentation

## 2015-03-01 DIAGNOSIS — Z79899 Other long term (current) drug therapy: Secondary | ICD-10-CM | POA: Insufficient documentation

## 2015-03-01 DIAGNOSIS — M1990 Unspecified osteoarthritis, unspecified site: Secondary | ICD-10-CM | POA: Insufficient documentation

## 2015-03-01 DIAGNOSIS — K219 Gastro-esophageal reflux disease without esophagitis: Secondary | ICD-10-CM | POA: Diagnosis not present

## 2015-03-01 DIAGNOSIS — H9190 Unspecified hearing loss, unspecified ear: Secondary | ICD-10-CM | POA: Diagnosis not present

## 2015-03-01 DIAGNOSIS — R1010 Upper abdominal pain, unspecified: Secondary | ICD-10-CM

## 2015-03-01 DIAGNOSIS — F419 Anxiety disorder, unspecified: Secondary | ICD-10-CM | POA: Diagnosis not present

## 2015-03-01 DIAGNOSIS — Z8709 Personal history of other diseases of the respiratory system: Secondary | ICD-10-CM | POA: Insufficient documentation

## 2015-03-01 DIAGNOSIS — Z7951 Long term (current) use of inhaled steroids: Secondary | ICD-10-CM | POA: Diagnosis not present

## 2015-03-01 DIAGNOSIS — Z90710 Acquired absence of both cervix and uterus: Secondary | ICD-10-CM | POA: Diagnosis not present

## 2015-03-01 DIAGNOSIS — Z9889 Other specified postprocedural states: Secondary | ICD-10-CM | POA: Insufficient documentation

## 2015-03-01 DIAGNOSIS — I10 Essential (primary) hypertension: Secondary | ICD-10-CM | POA: Diagnosis not present

## 2015-03-01 DIAGNOSIS — G629 Polyneuropathy, unspecified: Secondary | ICD-10-CM | POA: Diagnosis not present

## 2015-03-01 LAB — URINE MICROSCOPIC-ADD ON

## 2015-03-01 LAB — I-STAT TROPONIN, ED: TROPONIN I, POC: 0 ng/mL (ref 0.00–0.08)

## 2015-03-01 LAB — URINALYSIS, ROUTINE W REFLEX MICROSCOPIC
Bilirubin Urine: NEGATIVE
Glucose, UA: NEGATIVE mg/dL
HGB URINE DIPSTICK: NEGATIVE
Ketones, ur: NEGATIVE mg/dL
NITRITE: NEGATIVE
Protein, ur: NEGATIVE mg/dL
SPECIFIC GRAVITY, URINE: 1.009 (ref 1.005–1.030)
pH: 7.5 (ref 5.0–8.0)

## 2015-03-01 LAB — COMPREHENSIVE METABOLIC PANEL
ALBUMIN: 3.8 g/dL (ref 3.5–5.0)
ALK PHOS: 55 U/L (ref 38–126)
ALT: 17 U/L (ref 14–54)
ANION GAP: 9 (ref 5–15)
AST: 19 U/L (ref 15–41)
BUN: 18 mg/dL (ref 6–20)
CALCIUM: 9.1 mg/dL (ref 8.9–10.3)
CO2: 23 mmol/L (ref 22–32)
Chloride: 96 mmol/L — ABNORMAL LOW (ref 101–111)
Creatinine, Ser: 0.83 mg/dL (ref 0.44–1.00)
GFR calc non Af Amer: 57 mL/min — ABNORMAL LOW (ref >60)
GLUCOSE: 136 mg/dL — AB (ref 65–99)
Potassium: 4.7 mmol/L (ref 3.5–5.1)
SODIUM: 128 mmol/L — AB (ref 135–145)
TOTAL PROTEIN: 6.3 g/dL — AB (ref 6.5–8.1)
Total Bilirubin: 0.5 mg/dL (ref 0.3–1.2)

## 2015-03-01 LAB — CBC
HEMATOCRIT: 38.6 % (ref 36.0–46.0)
HEMOGLOBIN: 12.8 g/dL (ref 12.0–15.0)
MCH: 30.9 pg (ref 26.0–34.0)
MCHC: 33.2 g/dL (ref 30.0–36.0)
MCV: 93.2 fL (ref 78.0–100.0)
Platelets: 290 10*3/uL (ref 150–400)
RBC: 4.14 MIL/uL (ref 3.87–5.11)
RDW: 13.2 % (ref 11.5–15.5)
WBC: 10.1 10*3/uL (ref 4.0–10.5)

## 2015-03-01 LAB — LIPASE, BLOOD: Lipase: 37 U/L (ref 11–51)

## 2015-03-01 MED ORDER — IOHEXOL 300 MG/ML  SOLN
100.0000 mL | Freq: Once | INTRAMUSCULAR | Status: AC | PRN
  Administered 2015-03-01: 100 mL via INTRAVENOUS

## 2015-03-01 NOTE — ED Notes (Signed)
CT made aware pt ready.

## 2015-03-01 NOTE — ED Provider Notes (Signed)
CSN: [id number]     Arrival date & time 03/01/15  5732 History   First MD Initiated Contact with Patient 03/01/15 1202     Chief Complaint  Patient presents with  . Abdominal Pain    HPI   81 year old female presents today with complaints of abdominal pain. Patient reports that for the last month she has had intermittent abdominal pain, she reports the pain is located in her upper abdomen and is not localized. Patient cannot describe the pain even with given examples. She reports that it occasionally radiates up into her chest and back. She was seen on 02/03/2015 with complaints of chest pain, she had 2 negative troponins, chest x-ray showed left apical lung mass that patient did not want addressed. EKG was normal. Patient reports that she has had intermittent abdominal pain, with associated nausea. She denies any vomiting, fever or weight loss. She does report that she has decreased appetite recently, but is able to tolerate by mouth. She notes the abdominal pain is worse with position comments not associated with any shortness of breath, chest pain, diaphoresis, dizziness, vomiting or diarrhea. She reports normal bowel movements. Patient notes at the time my evaluation that she is asymptomatic, has no abdominal discomfort.   Past Medical History  Diagnosis Date  . History of hiatal hernia   . Mitral valve prolapse   . Plantar fasciitis   . Hay fever   . GERD (gastroesophageal reflux disease)   . Palpitations   . Neuromuscular disorder ([place])   . Deafness     "very HOH; even w/hearing aides we have to communicate via writing things down for her to read" (08/25/2012)  . H/O hiatal hernia   . Arthritis     "a little all over" (08/25/2012)  . Lung mass     Hx: of  . Anxiety   . Hemorrhoids   . Neuropathy ([place])   . Hypertension   . Hyperglycemia    Past Surgical History  Procedure Laterality Date  . Foot surgery Bilateral ~ 2000    "pinched nerve on feet" (08/25/2012)  . Vesicovaginal  fistula closure w/ tah    . Abdominal hysterectomy  1998  . Tonsillectomy and adenoidectomy  1940's  . Cesarean section  1952  . Knee arthroscopy w/ meniscectomy Left 07/15/2010    partial medial and lateral meniscectomies.Archie Endo 07/15/2010 (08/25/2012)  . Hip arthroplasty Right 08/26/2012    Procedure: ARTHROPLASTY BIPOLAR HIP;  Surgeon: Mauri Pole, MD;  Location: [facility];  Service: Orthopedics;  Laterality: Right;  . Open reduction internal fixation (orif) distal radial fracture Right 09/11/2012    Procedure: OPEN REDUCTION INTERNAL FIXATION (ORIF) RIGHT DISTAL RADIAL FRACTURE/ULNAR POSSIBLE BONE GRAFTING;  Surgeon: Linna Hoff, MD;  Location: [facility];  Service: Orthopedics;  Laterality: Right;   Family History  Problem Relation Age of Onset  . Colon cancer Mother   . Colon cancer Sister   . Cancer Sister     Breast  . Colon cancer Brother   . Heart disease Brother     Heart Attack  . Cancer Father     Leukemia   Social History  Substance Use Topics  . Smoking status: Never Smoker   . Smokeless tobacco: Never Used  . Alcohol Use: No   OB History    Gravida Para Term Preterm AB TAB SAB Ectopic Multiple Living   '2 2 2 '$ 0 0 0 0 0       Review of Systems  All other  systems reviewed and are negative.   Allergies  Statins and Aleve  Home Medications   Prior to Admission medications   Medication Sig Start Date End Date Taking? Authorizing Provider  acetaminophen (TYLENOL) 325 MG tablet Take 325-650 mg by mouth every 4 (four) hours as needed for mild pain.    Historical Provider, MD  Calcium Glycerophosphate (PRELIEF PO) Take 65 mg by mouth daily as needed (as needed for indigestion).     Historical Provider, MD  losartan (COZAAR) 50 MG tablet TAKE 1 TABLET (50 MG TOTAL) BY MOUTH DAILY. 09/11/14   Tiffany L Reed, DO  metoprolol succinate (TOPROL-XL) 100 MG 24 hr tablet TAKE ONE TABLET BY MOUTH ONCE DAILY. 11/09/14   Tiffany L Reed, DO  mometasone (NASONEX) 50 MCG/ACT nasal spray  Place 1 spray into the nose as needed.    Historical Provider, MD  omeprazole (PRILOSEC) 20 MG capsule Take 20 mg by mouth daily.    Historical Provider, MD  polyethylene glycol (MIRALAX / GLYCOLAX) packet Take 17 g by mouth daily as needed for mild constipation (constipation).     Historical Provider, MD  pregabalin (LYRICA) 75 MG capsule Take 1 capsule (75 mg total) by mouth 2 (two) times daily. 02/28/15   Gildardo Cranker, DO  sertraline (ZOLOFT) 50 MG tablet Take 50 mg by mouth daily.    Historical Provider, MD   BP 120/88 mmHg  Pulse 67  Temp(Src) 98.1 F (36.7 C) (Oral)  Resp 18  SpO2 95%   Physical Exam  Constitutional: She is oriented to person, place, and time. She appears well-developed and well-nourished. No distress.  HENT:  Head: Normocephalic and atraumatic.  Eyes: Conjunctivae are normal. Pupils are equal, round, and reactive to light. Right eye exhibits no discharge. Left eye exhibits no discharge. No scleral icterus.  Neck: Normal range of motion. No JVD present. No tracheal deviation present.  Cardiovascular: Normal rate and regular rhythm.   Pulmonary/Chest: Effort normal and breath sounds normal. No stridor. No respiratory distress. She has no wheezes. She has no rales. She exhibits no tenderness.  Abdominal: Soft. Bowel sounds are normal. She exhibits no distension and no mass. There is tenderness. There is no rebound and no guarding.  Very minor pain to palpation of the upper abdomen, no focal abdominal tenderness  Musculoskeletal: Normal range of motion. She exhibits no edema or tenderness.  Neurological: She is alert and oriented to person, place, and time. Coordination normal.  Skin: Skin is warm and dry. No rash noted. She is not diaphoretic. No erythema. No pallor.  Psychiatric: She has a normal mood and affect. Her behavior is normal. Judgment and thought content normal.  Nursing note and vitals reviewed.   ED Course  Procedures (including critical care  time) Labs Review Labs Reviewed  COMPREHENSIVE METABOLIC PANEL - Abnormal; Notable for the following:    Sodium 128 (*)    Chloride 96 (*)    Glucose, Bld 136 (*)    Total Protein 6.3 (*)    GFR calc non Af Amer 57 (*)    All other components within normal limits  LIPASE, BLOOD  CBC  URINALYSIS, ROUTINE W REFLEX MICROSCOPIC (NOT AT [facility])    Imaging Review No results found. I have personally reviewed and evaluated these images and lab results as part of my medical decision-making.   EKG Interpretation None      MDM   Final diagnoses:  Upper abdominal pain    Labs: I-STAT troponin, lipase, CMP, CBC, urinalysis  Imaging: CT abdomen and pelvis with contrast-  see above  Consults:  Therapeutics:  Discharge Meds:   Assessment/Plan: 82 year old female presents today with a month-long history of intermittent abdominal pain. Patient is very minimally tender to palpation of the abdomen, she appears in no acute distress, vital signs reassuring, no significant findings on laboratory data. Patient will undergo CT scan here in the ED for further evaluation. CT shows questionable gallbladder pathology with cholelithiasis, ultrasound further evaluation. Patient's care signed out to oncoming provider Cris lawyer pending ultrasound results and disposition.         Okey Regal, PA-C 03/02/15 1308  Leo Grosser, MD 03/02/15 [phone]

## 2015-03-01 NOTE — ED Notes (Signed)
Pt here with abd pain for several weeks, RUQ pain when she turns on her right side. Denies pain at current. Has no appetite. Denies v/d. Pt is very hard of hearing.

## 2015-03-01 NOTE — Discharge Instructions (Signed)
Return here as needed.  Follow-up with the surgeon provided.  They will help determine the course of action to take with her gallbladder

## 2015-03-02 ENCOUNTER — Encounter: Payer: Self-pay | Admitting: Cardiology

## 2015-03-02 LAB — URINE CULTURE

## 2015-03-05 ENCOUNTER — Telehealth: Payer: Self-pay | Admitting: *Deleted

## 2015-03-05 ENCOUNTER — Other Ambulatory Visit: Payer: Self-pay | Admitting: General Surgery

## 2015-03-05 ENCOUNTER — Telehealth: Payer: Self-pay | Admitting: Internal Medicine

## 2015-03-05 MED ORDER — COENZYME Q10 10 MG PO CAPS: ORAL_CAPSULE | ORAL | Status: DC

## 2015-03-05 NOTE — Telephone Encounter (Signed)
[name], daughter calling stating patient is having muscle cramps that is bothering her at night time.Daughter told her to take alittle mustard but patient will not do that because she thinks it will upset her stomach. This is affecting her quality life. Would like to know if there is something she can take to help with this. Patient has an appointment for a CPE on 03/12/15. Please Advise.

## 2015-03-05 NOTE — Telephone Encounter (Signed)
Patient daughter notified and agreed.  

## 2015-03-05 NOTE — Telephone Encounter (Signed)
I recommend she take coenzyme Q10 at bedtime for leg cramps.  It is over the counter.

## 2015-03-05 NOTE — Telephone Encounter (Signed)
FYI - Called patient today to follow up on referral for Neurology. [facility] has been trying to get in touch with the patient to get an appointment scheduled. Patient told me she is feeling better and doesn't want to worry about this right now. She has to have surgery for her gail stones and she just doesn't want to worry about this, she said she has an appointment with you next Monday and she will discuss it with you then

## 2015-03-07 ENCOUNTER — Telehealth: Payer: Self-pay

## 2015-03-07 NOTE — Telephone Encounter (Addendum)
Patient called this morning requesting rx for gabapentin. Patient was informed per previous phone note she should not take both gabapentin and lyrica. Patient states she was told otherwise. Patient would like a new rx for gabapentin sent to the pharmacy, patient states she is having muscle spasms at night.   Please advise, route response to in office Triage CMA Rodena Piety May)

## 2015-03-07 NOTE — Telephone Encounter (Signed)
Tried calling patient, voicemail not set up and could not leave message.

## 2015-03-07 NOTE — Telephone Encounter (Signed)
Tried calling patient, voicemail not set up and could not leave message. Will try again.

## 2015-03-07 NOTE — Telephone Encounter (Signed)
Patient notified and agreed. Will keep follow up appointment.

## 2015-03-07 NOTE — Telephone Encounter (Signed)
The CoQ10 over the counter is for the leg cramps at night.  Did she start it yet?   She should continue the lyrica for her nerve pains (numbness, burning, tingling) in her feet until I see her next.

## 2015-03-09 ENCOUNTER — Emergency Department (HOSPITAL_COMMUNITY): Payer: Medicare Other

## 2015-03-09 ENCOUNTER — Encounter (HOSPITAL_COMMUNITY): Payer: Self-pay | Admitting: Emergency Medicine

## 2015-03-09 ENCOUNTER — Emergency Department (HOSPITAL_COMMUNITY)
Admission: EM | Admit: 2015-03-09 | Discharge: 2015-03-09 | Disposition: A | Discharge status: Home / Self Care | Payer: Medicare Other | Attending: Emergency Medicine | Admitting: Emergency Medicine

## 2015-03-09 DIAGNOSIS — R1010 Upper abdominal pain, unspecified: Secondary | ICD-10-CM | POA: Diagnosis present

## 2015-03-09 DIAGNOSIS — F419 Anxiety disorder, unspecified: Secondary | ICD-10-CM | POA: Insufficient documentation

## 2015-03-09 DIAGNOSIS — Z79899 Other long term (current) drug therapy: Secondary | ICD-10-CM | POA: Diagnosis not present

## 2015-03-09 DIAGNOSIS — Z9889 Other specified postprocedural states: Secondary | ICD-10-CM | POA: Insufficient documentation

## 2015-03-09 DIAGNOSIS — G629 Polyneuropathy, unspecified: Secondary | ICD-10-CM | POA: Insufficient documentation

## 2015-03-09 DIAGNOSIS — I10 Essential (primary) hypertension: Secondary | ICD-10-CM | POA: Insufficient documentation

## 2015-03-09 DIAGNOSIS — K8020 Calculus of gallbladder without cholecystitis without obstruction: Secondary | ICD-10-CM | POA: Diagnosis not present

## 2015-03-09 DIAGNOSIS — H9190 Unspecified hearing loss, unspecified ear: Secondary | ICD-10-CM | POA: Insufficient documentation

## 2015-03-09 DIAGNOSIS — Z8709 Personal history of other diseases of the respiratory system: Secondary | ICD-10-CM | POA: Insufficient documentation

## 2015-03-09 DIAGNOSIS — R0602 Shortness of breath: Secondary | ICD-10-CM | POA: Diagnosis not present

## 2015-03-09 DIAGNOSIS — Z90710 Acquired absence of both cervix and uterus: Secondary | ICD-10-CM | POA: Insufficient documentation

## 2015-03-09 DIAGNOSIS — K219 Gastro-esophageal reflux disease without esophagitis: Secondary | ICD-10-CM | POA: Diagnosis not present

## 2015-03-09 DIAGNOSIS — M1990 Unspecified osteoarthritis, unspecified site: Secondary | ICD-10-CM | POA: Diagnosis not present

## 2015-03-09 DIAGNOSIS — R109 Unspecified abdominal pain: Secondary | ICD-10-CM

## 2015-03-09 DIAGNOSIS — Z7951 Long term (current) use of inhaled steroids: Secondary | ICD-10-CM | POA: Insufficient documentation

## 2015-03-09 LAB — URINALYSIS, ROUTINE W REFLEX MICROSCOPIC
BILIRUBIN URINE: NEGATIVE
Glucose, UA: NEGATIVE mg/dL
HGB URINE DIPSTICK: NEGATIVE
Ketones, ur: NEGATIVE mg/dL
Leukocytes, UA: NEGATIVE
Nitrite: NEGATIVE
Protein, ur: NEGATIVE mg/dL
SPECIFIC GRAVITY, URINE: 1.017 (ref 1.005–1.030)
pH: 7 (ref 5.0–8.0)

## 2015-03-09 LAB — COMPREHENSIVE METABOLIC PANEL
ALBUMIN: 3.6 g/dL (ref 3.5–5.0)
ALK PHOS: 55 U/L (ref 38–126)
ALT: 13 U/L — AB (ref 14–54)
ANION GAP: 8 (ref 5–15)
AST: 17 U/L (ref 15–41)
BUN: 19 mg/dL (ref 6–20)
CALCIUM: 9.1 mg/dL (ref 8.9–10.3)
CO2: 24 mmol/L (ref 22–32)
CREATININE: 0.81 mg/dL (ref 0.44–1.00)
Chloride: 98 mmol/L — ABNORMAL LOW (ref 101–111)
GFR calc Af Amer: >60 mL/min (ref >60)
GFR calc non Af Amer: 59 mL/min — ABNORMAL LOW (ref >60)
GLUCOSE: 113 mg/dL — AB (ref 65–99)
Potassium: 4.8 mmol/L (ref 3.5–5.1)
SODIUM: 130 mmol/L — AB (ref 135–145)
Total Bilirubin: 0.6 mg/dL (ref 0.3–1.2)
Total Protein: 6.1 g/dL — ABNORMAL LOW (ref 6.5–8.1)

## 2015-03-09 LAB — CBC
HCT: 36.3 % (ref 36.0–46.0)
HEMOGLOBIN: 12.1 g/dL (ref 12.0–15.0)
MCH: 30.9 pg (ref 26.0–34.0)
MCHC: 33.3 g/dL (ref 30.0–36.0)
MCV: 92.6 fL (ref 78.0–100.0)
Platelets: 271 10*3/uL (ref 150–400)
RBC: 3.92 MIL/uL (ref 3.87–5.11)
RDW: 13.3 % (ref 11.5–15.5)
WBC: 8.2 10*3/uL (ref 4.0–10.5)

## 2015-03-09 LAB — LIPASE, BLOOD: Lipase: 37 U/L (ref 11–51)

## 2015-03-09 NOTE — ED Provider Notes (Signed)
CSN: [id number]     Arrival date & time 03/09/15  9767 History   First MD Initiated Contact with Patient 03/09/15 1014     Chief Complaint  Patient presents with  . Abdominal Pain     (Consider location/radiation/quality/duration/timing/severity/associated sxs/prior Treatment) Patient is a 81 y.o. female presenting with abdominal pain. The history is provided by the patient and medical records. No language interpreter was used.  Abdominal Pain Associated symptoms: shortness of breath   Associated symptoms: no chills, no constipation, no cough, no diarrhea, no fatigue, no fever, no nausea, no sore throat and no vomiting    [name] is a 81 y.o. female  with a PMH of deafness, gallstones, HTN who presents to the Emergency Department complaining of intermittent upper abdominal pain which will occasionally radiate to lower abdomen. - unable to characterize. Pt. Was seen on 12/01 and diagnosed with gallstones - surgery set up for January 5th, but patient states she cannot wait that long for pain resolution. Denies fever, chest pain. Tolerating PO. Admits to occasional shortness of breath. Seen on 12/01 in the ED where U/S and abd. CT were performed.    Past Medical History  Diagnosis Date  . History of hiatal hernia   . Mitral valve prolapse   . Plantar fasciitis   . Hay fever   . GERD (gastroesophageal reflux disease)   . Palpitations   . Neuromuscular disorder ([place])   . Deafness     "very HOH; even w/hearing aides we have to communicate via writing things down for her to read" (08/25/2012)  . H/O hiatal hernia   . Arthritis     "a little all over" (08/25/2012)  . Lung mass     Hx: of  . Anxiety   . Hemorrhoids   . Neuropathy ([place])   . Hypertension   . Hyperglycemia    Past Surgical History  Procedure Laterality Date  . Foot surgery Bilateral ~ 2000    "pinched nerve on feet" (08/25/2012)  . Vesicovaginal fistula closure w/ tah    . Abdominal hysterectomy  1998  .  Tonsillectomy and adenoidectomy  1940's  . Cesarean section  1952  . Knee arthroscopy w/ meniscectomy Left 07/15/2010    partial medial and lateral meniscectomies.Archie Endo 07/15/2010 (08/25/2012)  . Hip arthroplasty Right 08/26/2012    Procedure: ARTHROPLASTY BIPOLAR HIP;  Surgeon: Mauri Pole, MD;  Location: [facility];  Service: Orthopedics;  Laterality: Right;  . Open reduction internal fixation (orif) distal radial fracture Right 09/11/2012    Procedure: OPEN REDUCTION INTERNAL FIXATION (ORIF) RIGHT DISTAL RADIAL FRACTURE/ULNAR POSSIBLE BONE GRAFTING;  Surgeon: Linna Hoff, MD;  Location: [facility];  Service: Orthopedics;  Laterality: Right;   Family History  Problem Relation Age of Onset  . Colon cancer Mother   . Colon cancer Sister   . Cancer Sister     Breast  . Colon cancer Brother   . Heart disease Brother     Heart Attack  . Cancer Father     Leukemia   Social History  Substance Use Topics  . Smoking status: Never Smoker   . Smokeless tobacco: Never Used  . Alcohol Use: No   OB History    Gravida Para Term Preterm AB TAB SAB Ectopic Multiple Living   '2 2 2 '$ 0 0 0 0 0       Review of Systems  Constitutional: Positive for appetite change. Negative for fever, chills, diaphoresis, fatigue and unexpected weight change.  HENT: Negative for congestion, rhinorrhea and sore throat.   Eyes: Negative for visual disturbance.  Respiratory: Positive for shortness of breath. Negative for cough and wheezing.   Cardiovascular: Negative.   Gastrointestinal: Positive for abdominal pain. Negative for nausea, vomiting, diarrhea and constipation.  Musculoskeletal: Negative for myalgias, back pain, arthralgias and neck pain.  Skin: Negative for rash.  Neurological: Negative for dizziness, weakness and headaches.  Hematological: Does not bruise/bleed easily.      Allergies  Statins and Aleve  Home Medications   Prior to Admission medications   Medication Sig Start Date End Date Taking?  Authorizing Provider  acetaminophen (TYLENOL) 325 MG tablet Take 325-650 mg by mouth every 4 (four) hours as needed for mild pain.   Yes Historical Provider, MD  Coenzyme Q10 10 MG capsule Take one capsule by mouth at bedtime for leg cramps 03/05/15  Yes Tiffany L Reed, DO  losartan (COZAAR) 50 MG tablet TAKE 1 TABLET (50 MG TOTAL) BY MOUTH DAILY. 09/11/14  Yes Tiffany L Reed, DO  metoprolol succinate (TOPROL-XL) 100 MG 24 hr tablet TAKE ONE TABLET BY MOUTH ONCE DAILY. 11/09/14  Yes Tiffany L Reed, DO  mometasone (NASONEX) 50 MCG/ACT nasal spray Place 1 spray into the nose as needed.   Yes Historical Provider, MD  omeprazole (PRILOSEC) 20 MG capsule Take 20 mg by mouth daily.   Yes Historical Provider, MD  potassium chloride (K-DUR,KLOR-CON) 10 MEQ tablet Take 10 mEq by mouth daily.   Yes Historical Provider, MD  pregabalin (LYRICA) 75 MG capsule Take 1 capsule (75 mg total) by mouth 2 (two) times daily. 02/28/15  Yes Gildardo Cranker, DO  sertraline (ZOLOFT) 50 MG tablet Take 50 mg by mouth daily.   Yes Historical Provider, MD  polyethylene glycol (MIRALAX / GLYCOLAX) packet Take 17 g by mouth daily as needed for mild constipation (constipation).     Historical Provider, MD   BP 152/75 mmHg  Pulse 67  Temp(Src) 97.3 F (36.3 C) (Oral)  Resp 16  SpO2 96% Physical Exam  Constitutional: She is oriented to person, place, and time. She appears well-developed and well-nourished.  Alert and in no acute distress  HENT:  Head: Normocephalic and atraumatic.  Hard of hearing.   Cardiovascular: Normal rate, regular rhythm, normal heart sounds and intact distal pulses.  Exam reveals no gallop and no friction rub.   No murmur heard. Pulmonary/Chest: Effort normal and breath sounds normal. No respiratory distress. She has no wheezes. She has no rales. She exhibits no tenderness.  Abdominal: She exhibits no mass. There is no rebound and no guarding.  Abdomen soft, non-distended Generalized abdominal  tenderness. Bowel sounds positive in all four quadrants  Musculoskeletal: She exhibits no edema.  Neurological: She is alert and oriented to person, place, and time.  Skin: Skin is warm and dry. No rash noted.  Psychiatric: She has a normal mood and affect. Her behavior is normal. Judgment and thought content normal.  Nursing note and vitals reviewed.   ED Course  Procedures (including critical care time) Labs Review Labs Reviewed  COMPREHENSIVE METABOLIC PANEL - Abnormal; Notable for the following:    Sodium 130 (*)    Chloride 98 (*)    Glucose, Bld 113 (*)    Total Protein 6.1 (*)    ALT 13 (*)    GFR calc non Af Amer 59 (*)    All other components within normal limits  URINALYSIS, ROUTINE W REFLEX MICROSCOPIC (NOT AT [facility]) - Abnormal; Notable for  the following:    APPearance HAZY (*)    All other components within normal limits  LIPASE, BLOOD  CBC    Imaging Review Dg Abd Acute W/chest  03/09/2015  CLINICAL DATA:  Abdominal pain.  Left lung mass. EXAM: DG ABDOMEN ACUTE W/ 1V CHEST COMPARISON:  Chest radiograph on 02/03/2015 FINDINGS: The bowel gas pattern is normal. No evidence of dilated bowel loops or free intraperitoneal air. Left apical lung mass shows no significant change compared to recent exam. A small to moderate left pleural effusion is seen on the upright view which appears increased. Heart size is within normal limits. IMPRESSION: Normal bowel gas pattern. Left apical lung mass, without significant change compared to recent study. Small to moderate left pleural effusion. Electronically Signed   By: Earle Gell M.D.   On: 03/09/2015 12:15   US Abdomen Limited Ruq  03/09/2015  CLINICAL DATA:  Abdominal pain.History of gallstones, with increasing pain. EXAM: US ABDOMEN LIMITED - RIGHT UPPER QUADRANT COMPARISON:  CT body 03/01/2015. FINDINGS: Gallbladder: Multiple layering gallstones. Gallbladder wall thickness 1.6 mm. Negative sonographic Murphy's sign. Common bile duct:  Diameter: Within normal limits, measuring 4.5 mm. Liver: No focal hepatic abnormalities. IMPRESSION: Cholelithiasis without signs of acute cholecystitis. Electronically Signed   By: Staci Righter M.D.   On: 03/09/2015 15:03   I have personally reviewed and evaluated these images and lab results as part of my medical decision-making.   EKG Interpretation None      MDM   Final diagnoses:  Abdominal pain  Calculus of gallbladder without cholecystitis without obstruction   Dennard Schaumann presents with generalized abdominal pain. Seen in ED on 12/01. Lap chole scheduled for 04/05/15  Labs:reviewed and reassuring Imaging: See above. No change from previous - no signs of acute cholecystitis   A&P: Cholelithiasis, abdominal pain  - Will discharge to home (patient lives with son who cares for her) in good and stable condition.   - Keep all appointments and follow up with PCP.   Patient seen by and discussed with Dr. Johnney Killian who agrees with treatment plan.    Ozella Almond Ward, PA-C 03/09/15 [place], MD 03/15/15 [phone]

## 2015-03-09 NOTE — Discharge Instructions (Signed)
Continue taking your home medication Drink plenty of fluids Follow a low fat, high fiber diet Keep all of your scheduled doctor's appointments Please follow up with your primary doctor in 3 days for discussion of your diagnoses and further evaluation after today's visit Please return to the ER for any new or worsening conditions, any additional concerns.

## 2015-03-09 NOTE — ED Notes (Signed)
From home with son, sts is due to have gall bladder removed next week but that pain is worse since yesterday, no vomiting or diarrhea, A/O and in NAD

## 2015-03-09 NOTE — Progress Notes (Signed)
Responded to request to assist patient with advance Directive.  Patient doesn't hear well but communicate by writing board.  Chaplain left document with nurse and patient.  Nurse and Chaplain thought  It best to wait on patient son's return.  Staff will page Chaplain when ready.

## 2015-03-12 ENCOUNTER — Ambulatory Visit (INDEPENDENT_AMBULATORY_CARE_PROVIDER_SITE_OTHER): Payer: Medicare Other | Admitting: Internal Medicine

## 2015-03-12 ENCOUNTER — Encounter: Payer: Self-pay | Admitting: Internal Medicine

## 2015-03-12 VITALS — BP 100/58 | HR 72 | Temp 97.5°F | Ht 66.0 in | Wt 166.0 lb

## 2015-03-12 DIAGNOSIS — G609 Hereditary and idiopathic neuropathy, unspecified: Secondary | ICD-10-CM | POA: Diagnosis not present

## 2015-03-12 DIAGNOSIS — E871 Hypo-osmolality and hyponatremia: Secondary | ICD-10-CM

## 2015-03-12 DIAGNOSIS — K8020 Calculus of gallbladder without cholecystitis without obstruction: Secondary | ICD-10-CM

## 2015-03-12 DIAGNOSIS — R911 Solitary pulmonary nodule: Secondary | ICD-10-CM

## 2015-03-12 DIAGNOSIS — F411 Generalized anxiety disorder: Secondary | ICD-10-CM | POA: Diagnosis not present

## 2015-03-12 DIAGNOSIS — Z0000 Encounter for general adult medical examination without abnormal findings: Secondary | ICD-10-CM

## 2015-03-12 MED ORDER — SERTRALINE HCL 100 MG PO TABS: 100.0000 mg | ORAL_TABLET | Freq: Every day | ORAL | Status: DC

## 2015-03-12 MED ORDER — PREGABALIN 75 MG PO CAPS: 75.0000 mg | ORAL_CAPSULE | Freq: Three times a day (TID) | ORAL | Status: DC

## 2015-03-12 NOTE — Progress Notes (Deleted)
Patient ID: [name], female   DOB: 1915/11/01, 81 y.o.   MRN: [id number]  Provider:  Rexene Edison. Mariea [name], D.O., C.M.D. Location:  [facility] PCP: Cynthia Kinnier, DO  Code Status: DNR Goals of Care: Advanced Directive information Does patient have an advance directive?: Yes, Type of Advance Directive: Living will   Chief Complaint  Patient presents with  . Annual Exam    Wellness Here with daughter [name]  . Hospitalization Follow-up    ER 03/09/15 for abdominal pain Dx. cholelithiasis  . MMSE    22/30 failed clock drawing    HPI: Patient is a 81 y.o. female seen today for an annual comprehensive examination.  Terrible back pain.  Her abdomen was hurting across and she had to cut her elastic.  She has had indigestion, not eating, lost 6 lbs since 02/08/15.  Has cholelithiasis, but surgery is not scheduled until 04/05/15.  She's so bloated she can't wear her pants.    She'd been having neuropathic pain and only talked to me about that.    She had tried to go off prilosec and went back on it, but pain got worse and worse.     ROS  Past Medical History  Diagnosis Date  . History of hiatal hernia   . Mitral valve prolapse   . Plantar fasciitis   . Hay fever   . GERD (gastroesophageal reflux disease)   . Palpitations   . Neuromuscular disorder ([place])   . Deafness     "very HOH; even w/hearing aides we have to communicate via writing things down for her to read" (08/25/2012)  . H/O hiatal hernia   . Arthritis     "a little all over" (08/25/2012)  . Lung mass     Hx: of  . Anxiety   . Hemorrhoids   . Neuropathy ([place])   . Hypertension   . Hyperglycemia    Past Surgical History  Procedure Laterality Date  . Foot surgery Bilateral ~ 2000    "pinched nerve on feet" (08/25/2012)  . Vesicovaginal fistula closure w/ tah    . Abdominal hysterectomy  1998  . Tonsillectomy and adenoidectomy  1940's  . Cesarean section  1952  . Knee arthroscopy w/ meniscectomy Left  07/15/2010    partial medial and lateral meniscectomies.[name] 07/15/2010 (08/25/2012)  . Hip arthroplasty Right 08/26/2012    Procedure: ARTHROPLASTY BIPOLAR HIP;  Surgeon: [name];  Location: [place];  Service: Orthopedics;  Laterality: Right;  . Open reduction internal fixation (orif) distal radial fracture Right 09/11/2012    Procedure: OPEN REDUCTION INTERNAL FIXATION (ORIF) RIGHT DISTAL RADIAL FRACTURE/ULNAR POSSIBLE BONE GRAFTING;  Surgeon: [name];  Location: [place];  Service: Orthopedics;  Laterality: Right;    reports that she has never smoked. She has never used smokeless tobacco. She reports that she does not drink alcohol or use illicit drugs. Family History  Problem Relation Age of Onset  . Colon cancer Mother   . Colon cancer Sister   . Cancer Sister     Breast  . Colon cancer Brother   . Heart disease Brother     Heart Attack  . Cancer Father     Leukemia  . Pneumonia Sister     Pertinent  Health Maintenance Due  Topic Date Due  . DEXA SCAN  03/31/2018 (Originally Mar [birth year], 201984)  . INFLUENZA VACCINE  10/30/2015  . PNA vac Low Risk Adult  Completed   Fall Risk  03/12/2015 02/08/2015 12/29/2014  10/27/2014 07/28/2014  Falls in the past year? No No No No Yes  Number falls in past yr: - - - - 2 or more  Injury with Fall? - - - - No   Depression screen [facility] 2/9 03/12/2015 04/28/2014 04/14/2013  Decreased Interest 0 0 1  Down, Depressed, Hopeless 2 0 1  PHQ - 2 Score 2 0 2  Altered sleeping 3 - -  Tired, decreased energy 3 - -  Change in appetite 3 - -  Feeling bad or failure about yourself  2 - -  Trouble concentrating 1 - -  Moving slowly or fidgety/restless 0 - -  Suicidal thoughts 1 - -  PHQ-9 Score 15 - -  Difficult doing work/chores Not difficult at all - -    '@MMSE'$  RESULT@  Allergies  Allergen Reactions  . Statins Other (See Comments)    myopathy  . Aleve [Naproxen Sodium] Swelling    Legs and feet      Medication List       This list  is accurate as of: 03/12/15  2:49 PM.  Always use your most recent med list.               acetaminophen 325 MG tablet  Commonly known as:  TYLENOL  Take 325-650 mg by mouth every 4 (four) hours as needed for mild pain.     losartan 50 MG tablet  Commonly known as:  COZAAR  TAKE 1 TABLET (50 MG TOTAL) BY MOUTH DAILY.     metoprolol succinate 100 MG 24 hr tablet  Commonly known as:  TOPROL-XL  TAKE ONE TABLET BY MOUTH ONCE DAILY.     mometasone 50 MCG/ACT nasal spray  Commonly known as:  NASONEX  Place 1 spray into the nose as needed.     omeprazole 20 MG capsule  Commonly known as:  PRILOSEC  Take 20 mg by mouth daily.     polyethylene glycol packet  Commonly known as:  MIRALAX / GLYCOLAX  Take 17 g by mouth daily as needed for mild constipation (constipation).     potassium chloride 10 MEQ tablet  Commonly known as:  K-DUR,KLOR-CON  Take 10 mEq by mouth daily.     pregabalin 75 MG capsule  Commonly known as:  LYRICA  Take 1 capsule (75 mg total) by mouth 2 (two) times daily.     sertraline 50 MG tablet  Commonly known as:  ZOLOFT  Take 50 mg by mouth daily.        Filed Vitals:   03/12/15 1444  BP: 100/58  Pulse: 72  Temp: 97.5 F (36.4 C)  TempSrc: Oral  Height: '5\' 6"'$  (1.676 m)  Weight: 166 lb (75.297 kg)  SpO2: 96%   Body mass index is 26.81 kg/(m^2). Physical Exam  Labs reviewed: Basic Metabolic Panel:  Recent Labs  02/08/15 1114 03/01/15 1024 03/09/15 1034  NA 128* 128* 130*  K 5.4* 4.7 4.8  CL 91* 96* 98*  CO2 '21 23 24  '$ GLUCOSE 113* 136* 113*  BUN '21 18 19  '$ CREATININE 0.79 0.83 0.81  CALCIUM 9.5 9.1 9.1   Liver Function Tests:  Recent Labs  03/01/15 1024 03/09/15 1034  AST 19 17  ALT 17 13*  ALKPHOS 55 55  BILITOT 0.5 0.6  PROT 6.3* 6.1*  ALBUMIN 3.8 3.6    Recent Labs  03/01/15 1024 03/09/15 1034  LIPASE 37 37   No results for input(s): AMMONIA in the last 8760 hours. CBC:  Recent Labs  07/24/14 0909  02/03/15 1046 02/08/15 1114 03/01/15 1024 03/09/15 1034  WBC 6.2 11.0* 8.9 10.1 8.2  NEUTROABS 3.0  --  6.4  --   --   HGB  --  13.4  --  12.8 12.1  HCT 38.6 39.0 38.5 38.6 36.3  MCV  --  92.2  --  93.2 92.6  PLT  --  243  --  290 271   Cardiac Enzymes: No results for input(s): CKTOTAL, CKMB, CKMBINDEX, TROPONINI in the last 8760 hours. BNP: Invalid input(s): POCBNP Lab Results  Component Value Date   HGBA1C 6.2* 02/13/2015   Lab Results  Component Value Date   TSH 1.290 02/13/2015   Lab Results  Component Value Date   GYIRSWNI62 7035* 02/13/2015   Lab Results  Component Value Date   FOLATE >20.0 02/13/2015   Lab Results  Component Value Date   IRON 84 12/08/2012    Imaging and Procedures obtained recently: Ct Abdomen Pelvis W Contrast  03/01/2015  CLINICAL DATA:  Abdominal pain. ED Notes: Pt here with abd pain for several weeks, RUQ pain when she turns on her right side. Denies pain at current. Has no appetite. EXAM: CT ABDOMEN AND PELVIS WITH CONTRAST TECHNIQUE: Multidetector CT imaging of the abdomen and pelvis was performed using the standard protocol following bolus administration of intravenous contrast. CONTRAST:  152m OMNIPAQUE IOHEXOL 300 MG/ML  SOLN COMPARISON:  PET-CT 01/06/2013 comet CT of the chest on 01/18/2014 FINDINGS: Lower chest: Chronic changes are identified at the right lung base. There is a new left-sided pleural effusion and associated left basilar atelectasis. Significant coronary artery and mitral calcification noted. Upper abdomen: Gallbladder is distended and contains sludge and stones. No focal abnormality identified within the liver, spleen, or adrenal glands. Pancreas has a normal appearance. There is symmetric enhancement and excretion of both kidneys. Small cysts are identified in the upper [name] regions of both kidneys. Gastrointestinal tract: The stomach and small bowel loops are normal in appearance. The appendix is well seen and has a normal  appearance. There are numerous colonic diverticula. No acute diverticulitis. Pelvis: Small amount of air is identified within the urinary bladder possibly following recent catheterization. Status post hysterectomy. No adnexal mass. Retroperitoneum: There is dense atherosclerotic calcification of the abdominal aorta. Infrarenal aorta measures 3.0 cm. No evidence for dissection. No retroperitoneal or mesenteric adenopathy. Abdominal wall: Unremarkable. Osseous structures: Right hip arthroplasty. Moderate degenerative changes in the lumbar spine. No suspicious lytic or blastic lesions are identified. IMPRESSION: 1. Interval development of left-sided pleural effusion associated with left basilar atelectasis. 2. Coronary artery disease and mitral calcification. 3. Sludge and stones within the gallbladder. Consider further evaluation with abdominal ultrasound. 4. Small renal cysts. 5. Normal appendix. 6. Colonic diverticulosis. 7. Abdominal aortic aneurysm. Recommend followup by ultrasound in 3 years. This recommendation follows ACR consensus guidelines: White Paper of the ACR Incidental Findings Committee II on Vascular Findings. JNatasha MeadColl Radiol 2013; 10:789-794 Electronically Signed   By: ENolon NationsM.D.   On: 03/01/2015 16:08   Dg Abd Acute W/chest  03/09/2015  CLINICAL DATA:  Abdominal pain.  Left lung mass. EXAM: DG ABDOMEN ACUTE W/ 1V CHEST COMPARISON:  Chest radiograph on 02/03/2015 FINDINGS: The bowel gas pattern is normal. No evidence of dilated bowel loops or free intraperitoneal air. Left apical lung mass shows no significant change compared to recent exam. A small to moderate left pleural effusion is seen on the upright view which appears increased. Heart size  is within normal limits. IMPRESSION: Normal bowel gas pattern. Left apical lung mass, without significant change compared to recent study. Small to moderate left pleural effusion. Electronically Signed   By: Earle Gell M.D.   On: 03/09/2015  12:15   US Abdomen Limited Ruq  03/09/2015  CLINICAL DATA:  Abdominal pain.History of gallstones, with increasing pain. EXAM: US ABDOMEN LIMITED - RIGHT UPPER QUADRANT COMPARISON:  CT body 03/01/2015. FINDINGS: Gallbladder: Multiple layering gallstones. Gallbladder wall thickness 1.6 mm. Negative sonographic Murphy's sign. Common bile duct: Diameter: Within normal limits, measuring 4.5 mm. Liver: No focal hepatic abnormalities. IMPRESSION: Cholelithiasis without signs of acute cholecystitis. Electronically Signed   By: Staci Righter M.D.   On: 03/09/2015 15:03   US Abdomen Limited Ruq  03/01/2015  CLINICAL DATA:  Patient with right upper quadrant abdominal pain. EXAM: US ABDOMEN LIMITED - RIGHT UPPER QUADRANT COMPARISON:  CT 03/01/2015. FINDINGS: Gallbladder: Multiple mobile echogenic stones are demonstrated within the gallbladder lumen. No gallbladder wall thickening. No pericholecystic fluid. Negative sonographic Murphy sign. Common bile duct: Diameter: 5.5 mm Liver: No focal lesion identified. Within normal limits in parenchymal echogenicity. IMPRESSION: Cholelithiasis without sonographic evidence for acute cholecystitis. Electronically Signed   By: Lovey Newcomer M.D.   On: 03/01/2015 17:45    Assessment/Plan There are no diagnoses linked to this encounter.  Functional status:  Family/ staff Communication:   Labs/tests ordered:

## 2015-03-12 NOTE — Progress Notes (Signed)
Patient ID: [name], female   DOB: January 18, 1916, 81 y.o.   MRN: [id number] MMSE done today 22/30 failed clock drawing. Patient very [place] had to write all instructions for patient to understand.

## 2015-03-13 NOTE — Progress Notes (Signed)
Location: [facility]  Provider: Trany Chernick L. Mariea Clonts, D.O., C.M.D.  Code Status: DNR Goals of Care: Advanced Directive information Does patient have an advance directive?: Yes, Type of Advance Directive: Living will  Chief Complaint  Patient presents with  . Annual Exam    Wellness Here with daughter [name]  . Hospitalization Follow-up    ER 03/09/15 for abdominal pain Dx. cholelithiasis  . MMSE    22/30 failed clock drawing    HPI: Patient is a 81 y.o. female seen in the office today for an annual wellness exam, ER f/u after visit with cholelithiasis.  She is accompanied by her [place], [name]  Pt lives with her son who has bipolar disorder and is not always a reliable historian.  Until this episode of severe abdominal pain, [name] has done very well at home.  She is incredibly HOH even with hearing aides and must read her phone when our office calls.  I communicate with her with pen and paper or a dry erase board.    Today she c/o terrible lower left-sided back pain.  Her abdomen was hurting across and she had to cut her elastic on her tops and pants.  She has had indigestion, not eating, lost 6 lbs since 02/08/15.  Has cholelithiasis, but surgery is not scheduled until 04/05/15.  She's so bloated she can't wear her pants.  She is miserable and has been talking about wishing she would die rather than waiting until January for surgery.  Her daughter is very concerned about these comments.  Pt's only comfortable position today was resting on her back with pillows beneath her knees and covered with blankets.  She appears pale, slightly jaundice, and uncomfortable.     She'd been having neuropathic pain at her last visit and only talked to me about that not even describing abdominal pain.  She's also become more short of breath since the abdominal pain began.  She had tried to go off prilosec and went back on it, but pain got worse and worse.   She requests an  increase in her lyrica to three times a day due to her pain in her feet.  She is also using tylenol.           Depression screen [facility] 2/9 03/12/2015 04/28/2014 04/14/2013  Decreased Interest 0 0 1  Down, Depressed, Hopeless 2 0 1  PHQ - 2 Score 2 0 2  Altered sleeping 3 - -  Tired, decreased energy 3 - -  Change in appetite 3 - -  Feeling bad or failure about yourself  2 - -  Trouble concentrating 1 - -  Moving slowly or fidgety/restless 0 - -  Suicidal thoughts 1 - -  PHQ-9 Score 15 - -  Difficult doing work/chores Not difficult at all - -    Fall Risk  03/12/2015 02/08/2015 12/29/2014 10/27/2014 07/28/2014  Falls in the past year? No No No No Yes  Number falls in past yr: - - - - 2 or more  Injury with Fall? - - - - No   MMSE - Mini Mental State Exam 03/12/2015  Orientation to time 4  Orientation to Place 5  Registration 3  Attention/ Calculation 1  Recall 3  Language- name 2 objects 2  Language- repeat 0  Language- follow 3 step command 2  Language- read & follow direction 1  Write a sentence 1  Copy design 0  Total score 22  Health Maintenance  Topic Date Due  . DEXA SCAN  03/31/2018 (Originally February 15, 201984)  . INFLUENZA VACCINE  10/30/2015  . TETANUS/TDAP  11/09/2023  . ZOSTAVAX  Completed  . PNA vac Low Risk Adult  Completed   Urinary incontinence? no Functional status?  Independent normally, but struggling in the past few weeks with bathing, dressing and eating poorly due to illness.   Exercise?  Not exercising now except limited movements in bed Diet?  Not eating well right now, but normally eats balanced diet. Vision:  Wears glasses, no macular or glaucoma drops used.   Hearing:  VERY HOH.  Wears hearing aides bilaterally and communicates by written word Dentition: fair.  No concerns with chewing or swallowing. Pain:  Right now she notes severe pain in her left lower back and upper abdomen like a band coming around.  Review of Systems:  Review of Systems   Constitutional: Positive for malaise/fatigue. Negative for fever and chills.  HENT: Positive for hearing loss.   Eyes: Negative for blurred vision.       Glasses  Respiratory: Negative for shortness of breath.   Cardiovascular: Negative for chest pain.  Gastrointestinal: Positive for abdominal pain.  Genitourinary: Negative for dysuria.  Musculoskeletal: Positive for back pain. Negative for falls.  Skin: Negative for rash.  Neurological: Positive for weakness. Negative for dizziness and loss of consciousness.  Psychiatric/Behavioral: Positive for depression. Negative for memory loss. The patient is nervous/anxious.        Some confusion recently, but this is not typical, MMSE score affected by her poor hearing (did well on those she could "read" but poorly on those she had to listen to)    Past Medical History  Diagnosis Date  . History of hiatal hernia   . Mitral valve prolapse   . Plantar fasciitis   . Hay fever   . GERD (gastroesophageal reflux disease)   . Palpitations   . Neuromuscular disorder ([place])   . Deafness     "very HOH; even w/hearing aides we have to communicate via writing things down for her to read" (08/25/2012)  . H/O hiatal hernia   . Arthritis     "a little all over" (08/25/2012)  . Lung mass     Hx: of  . Anxiety   . Hemorrhoids   . Neuropathy ([place])   . Hypertension   . Hyperglycemia     Past Surgical History  Procedure Laterality Date  . Foot surgery Bilateral ~ 2000    "pinched nerve on feet" (08/25/2012)  . Vesicovaginal fistula closure w/ tah    . Abdominal hysterectomy  1998  . Tonsillectomy and adenoidectomy  1940's  . Cesarean section  1952  . Knee arthroscopy w/ meniscectomy Left 07/15/2010    partial medial and lateral meniscectomies.Archie Endo 07/15/2010 (08/25/2012)  . Hip arthroplasty Right 08/26/2012    Procedure: ARTHROPLASTY BIPOLAR HIP;  Surgeon: Mauri Pole, MD;  Location: [facility];  Service: Orthopedics;  Laterality: Right;  . Open  reduction internal fixation (orif) distal radial fracture Right 09/11/2012    Procedure: OPEN REDUCTION INTERNAL FIXATION (ORIF) RIGHT DISTAL RADIAL FRACTURE/ULNAR POSSIBLE BONE GRAFTING;  Surgeon: Linna Hoff, MD;  Location: [place];  Service: Orthopedics;  Laterality: Right;    The patient has a family history of  shoulder  Allergies  Allergen Reactions  . Statins Other (See Comments)    myopathy  . Aleve [Naproxen Sodium] Swelling    Legs and feet      Medication List  This list is accurate as of: 03/12/15 11:59 PM.  Always use your most recent med list.               acetaminophen 325 MG tablet  Commonly known as:  TYLENOL  Take 325-650 mg by mouth every 4 (four) hours as needed for mild pain.     losartan 50 MG tablet  Commonly known as:  COZAAR  TAKE 1 TABLET (50 MG TOTAL) BY MOUTH DAILY.     metoprolol succinate 100 MG 24 hr tablet  Commonly known as:  TOPROL-XL  TAKE ONE TABLET BY MOUTH ONCE DAILY.     mometasone 50 MCG/ACT nasal spray  Commonly known as:  NASONEX  Place 1 spray into the nose as needed.     omeprazole 20 MG capsule  Commonly known as:  PRILOSEC  Take 20 mg by mouth daily.     polyethylene glycol packet  Commonly known as:  MIRALAX / GLYCOLAX  Take 17 g by mouth daily as needed for mild constipation (constipation).     potassium chloride 10 MEQ tablet  Commonly known as:  K-DUR,KLOR-CON  Take 10 mEq by mouth daily.     pregabalin 75 MG capsule  Commonly known as:  LYRICA  Take 1 capsule (75 mg total) by mouth 3 (three) times daily.     sertraline 100 MG tablet  Commonly known as:  ZOLOFT  Take 1 tablet (100 mg total) by mouth at bedtime.        Physical Exam: Filed Vitals:   03/12/15 1444  BP: 100/58  Pulse: 72  Temp: 97.5 F (36.4 C)  TempSrc: Oral  Height: '5\' 6"'$  (1.676 m)  Weight: 166 lb (75.297 kg)  SpO2: 96%   Body mass index is 26.81 kg/(m^2). Physical Exam  Constitutional: She is oriented to person,  place, and time. She appears well-developed.  Appears uncomfortable with positional changes  HENT:  Head: Normocephalic and atraumatic.  Right Ear: External ear normal.  Left Ear: External ear normal.  Nose: Nose normal.  Mouth/Throat: Oropharynx is clear and moist. No oropharyngeal exudate.  HOH, wears hearing aides, no significant cerumen in ear canals  Eyes: Conjunctivae and EOM are normal. Pupils are equal, round, and reactive to light.  glasses  Neck: Neck supple. No JVD present.  Cardiovascular: Normal rate, regular rhythm, normal heart sounds and intact distal pulses.   Pulmonary/Chest: Effort normal and breath sounds normal. No respiratory distress.  Abdominal: Soft. Bowel sounds are normal. She exhibits no mass. There is tenderness. There is no rebound and no guarding.  In upper quadrants bilaterally   Musculoskeletal: She exhibits tenderness (around left lower lumbar region, lower ribs/flank). She exhibits no edema.  Neurological: She is alert and oriented to person, place, and time.  Skin:  Slight jaundice  Psychiatric:  anxious    Labs reviewed: Basic Metabolic Panel:  Recent Labs  02/08/15 1114 02/13/15 1515 03/01/15 1024 03/09/15 1034  NA 128*  --  128* 130*  K 5.4*  --  4.7 4.8  CL 91*  --  96* 98*  CO2 21  --  23 24  GLUCOSE 113*  --  136* 113*  BUN 21  --  18 19  CREATININE 0.79  --  0.83 0.81  CALCIUM 9.5  --  9.1 9.1  TSH  --  1.290  --   --    Liver Function Tests:  Recent Labs  03/01/15 1024 03/09/15 1034  AST 19 17  ALT 17  13*  ALKPHOS 55 55  BILITOT 0.5 0.6  PROT 6.3* 6.1*  ALBUMIN 3.8 3.6    Recent Labs  03/01/15 1024 03/09/15 1034  LIPASE 37 37   No results for input(s): AMMONIA in the last 8760 hours. CBC:  Recent Labs  07/24/14 0909 02/03/15 1046 02/08/15 1114 03/01/15 1024 03/09/15 1034  WBC 6.2 11.0* 8.9 10.1 8.2  NEUTROABS 3.0  --  6.4  --   --   HGB  --  13.4  --  12.8 12.1  HCT 38.6 39.0 38.5 38.6 36.3  MCV   --  92.2  --  93.2 92.6  PLT  --  243  --  290 271   Lipid Panel: No results for input(s): CHOL, HDL, LDLCALC, TRIG, CHOLHDL, LDLDIRECT in the last 8760 hours. Lab Results  Component Value Date   HGBA1C 6.2* 02/13/2015    Procedures: Ct Abdomen Pelvis W Contrast  03/01/2015  CLINICAL DATA:  Abdominal pain. ED Notes: Pt here with abd pain for several weeks, RUQ pain when she turns on her right side. Denies pain at current. Has no appetite. EXAM: CT ABDOMEN AND PELVIS WITH CONTRAST TECHNIQUE: Multidetector CT imaging of the abdomen and pelvis was performed using the standard protocol following bolus administration of intravenous contrast. CONTRAST:  129m OMNIPAQUE IOHEXOL 300 MG/ML  SOLN COMPARISON:  PET-CT 01/06/2013 comet CT of the chest on 01/18/2014 FINDINGS: Lower chest: Chronic changes are identified at the right lung base. There is a new left-sided pleural effusion and associated left basilar atelectasis. Significant coronary artery and mitral calcification noted. Upper abdomen: Gallbladder is distended and contains sludge and stones. No focal abnormality identified within the liver, spleen, or adrenal glands. Pancreas has a normal appearance. There is symmetric enhancement and excretion of both kidneys. Small cysts are identified in the upper pole regions of both kidneys. Gastrointestinal tract: The stomach and small bowel loops are normal in appearance. The appendix is well seen and has a normal appearance. There are numerous colonic diverticula. No acute diverticulitis. Pelvis: Small amount of air is identified within the urinary bladder possibly following recent catheterization. Status post hysterectomy. No adnexal mass. Retroperitoneum: There is dense atherosclerotic calcification of the abdominal aorta. Infrarenal aorta measures 3.0 cm. No evidence for dissection. No retroperitoneal or mesenteric adenopathy. Abdominal wall: Unremarkable. Osseous structures: Right hip arthroplasty. Moderate  degenerative changes in the lumbar spine. No suspicious lytic or blastic lesions are identified. IMPRESSION: 1. Interval development of left-sided pleural effusion associated with left basilar atelectasis. 2. Coronary artery disease and mitral calcification. 3. Sludge and stones within the gallbladder. Consider further evaluation with abdominal ultrasound. 4. Small renal cysts. 5. Normal appendix. 6. Colonic diverticulosis. 7. Abdominal aortic aneurysm. Recommend followup by ultrasound in 3 years. This recommendation follows ACR consensus guidelines: White Paper of the ACR Incidental Findings Committee II on Vascular Findings. JNatasha MeadColl Radiol 2013; 10:789-794 Electronically Signed   By: ENolon NationsM.D.   On: 03/01/2015 16:08   Dg Abd Acute W/chest  03/09/2015  CLINICAL DATA:  Abdominal pain.  Left lung mass. EXAM: DG ABDOMEN ACUTE W/ 1V CHEST COMPARISON:  Chest radiograph on 02/03/2015 FINDINGS: The bowel gas pattern is normal. No evidence of dilated bowel loops or free intraperitoneal air. Left apical lung mass shows no significant change compared to recent exam. A small to moderate left pleural effusion is seen on the upright view which appears increased. Heart size is within normal limits. IMPRESSION: Normal bowel gas pattern. Left apical lung mass,  without significant change compared to recent study. Small to moderate left pleural effusion. Electronically Signed   By: Earle Gell M.D.   On: 03/09/2015 12:15   US Abdomen Limited Ruq  03/09/2015  CLINICAL DATA:  Abdominal pain.History of gallstones, with increasing pain. EXAM: US ABDOMEN LIMITED - RIGHT UPPER QUADRANT COMPARISON:  CT body 03/01/2015. FINDINGS: Gallbladder: Multiple layering gallstones. Gallbladder wall thickness 1.6 mm. Negative sonographic Murphy's sign. Common bile duct: Diameter: Within normal limits, measuring 4.5 mm. Liver: No focal hepatic abnormalities. IMPRESSION: Cholelithiasis without signs of acute cholecystitis.  Electronically Signed   By: Staci Righter M.D.   On: 03/09/2015 15:03   US Abdomen Limited Ruq  03/01/2015  CLINICAL DATA:  Patient with right upper quadrant abdominal pain. EXAM: US ABDOMEN LIMITED - RIGHT UPPER QUADRANT COMPARISON:  CT 03/01/2015. FINDINGS: Gallbladder: Multiple mobile echogenic stones are demonstrated within the gallbladder lumen. No gallbladder wall thickening. No pericholecystic fluid. Negative sonographic Murphy sign. Common bile duct: Diameter: 5.5 mm Liver: No focal lesion identified. Within normal limits in parenchymal echogenicity. IMPRESSION: Cholelithiasis without sonographic evidence for acute cholecystitis. Electronically Signed   By: Lovey Newcomer M.D.   On: 03/01/2015 17:45    Assessment/Plan 1. Hereditary and idiopathic peripheral neuropathy - will increase lyrica to '75mg'$  po tid for neuropathy,cont to monitor - pregabalin (LYRICA) 75 MG capsule; Take 1 capsule (75 mg total) by mouth 3 (three) times daily.  Dispense: 90 capsule; Refill: 0  2. Calculus of gallbladder without cholecystitis without obstruction - she is continuing to decline awaiting surgery -if there's any way that her surgery can be done sooner, it would be to her benefit -she is developing a failure to thrive picture, lost 6 lbs already - due to this decline, referral made to home health for therapy and nursing assessments/education--Ambulatory referral to [facility]  3. Solitary pulmonary nodule on lung CT - this has remained unchanged and I see no reason to keep repeating it when she does not want intervention done  4. Hyponatremia -slight improvement since hospitalization  5. Medicare annual wellness visit, subsequent -see hpi  6. Anxiety state - related to her need for surgery -sertraline (ZOLOFT) 100 MG tablet; Take 1 tablet (100 mg total) by mouth at bedtime.  Dispense: 30 tablet; Refill: 3  Labs/tests ordered:   Orders Placed This Encounter  Procedures  . Ambulatory referral to  Home Health    Referral Priority:  Routine    Referral Type:  Home Health Care    Referral Reason:  Specialty Services Required    Requested Specialty:  [place]    Number of Visits Requested:  1    Next appt:  Will see her postop and prn sooner   Marelyn Rouser L. Mystie Ormand, D.O. [facility] [address], [place] [address] Cell Phone (Mon-Fri 8am-5pm):  [phone] On Call:  [phone] & follow prompts after 5pm & weekends Office Phone:  [phone] Office Fax:  [phone]

## 2015-03-15 DIAGNOSIS — I10 Essential (primary) hypertension: Secondary | ICD-10-CM

## 2015-03-15 DIAGNOSIS — R2689 Other abnormalities of gait and mobility: Secondary | ICD-10-CM

## 2015-03-15 DIAGNOSIS — G609 Hereditary and idiopathic neuropathy, unspecified: Secondary | ICD-10-CM

## 2015-03-15 DIAGNOSIS — K8020 Calculus of gallbladder without cholecystitis without obstruction: Secondary | ICD-10-CM

## 2015-03-16 ENCOUNTER — Inpatient Hospital Stay (HOSPITAL_COMMUNITY): Payer: Medicare Other

## 2015-03-16 ENCOUNTER — Inpatient Hospital Stay (HOSPITAL_COMMUNITY)
Admission: AD | Admit: 2015-03-16 | Discharge: 2015-03-27 | DRG: 418 | Disposition: A | Discharge status: Skilled Nursing Facility | Payer: Medicare Other | Source: Ambulatory Visit | Attending: Surgery | Admitting: Surgery

## 2015-03-16 DIAGNOSIS — K5900 Constipation, unspecified: Secondary | ICD-10-CM | POA: Diagnosis present

## 2015-03-16 DIAGNOSIS — I5032 Chronic diastolic (congestive) heart failure: Secondary | ICD-10-CM | POA: Diagnosis present

## 2015-03-16 DIAGNOSIS — K8020 Calculus of gallbladder without cholecystitis without obstruction: Secondary | ICD-10-CM | POA: Diagnosis not present

## 2015-03-16 DIAGNOSIS — Z01818 Encounter for other preprocedural examination: Secondary | ICD-10-CM | POA: Diagnosis not present

## 2015-03-16 DIAGNOSIS — J90 Pleural effusion, not elsewhere classified: Secondary | ICD-10-CM | POA: Diagnosis not present

## 2015-03-16 DIAGNOSIS — Z800 Family history of malignant neoplasm of digestive organs: Secondary | ICD-10-CM

## 2015-03-16 DIAGNOSIS — Z01810 Encounter for preprocedural cardiovascular examination: Secondary | ICD-10-CM

## 2015-03-16 DIAGNOSIS — K8010 Calculus of gallbladder with chronic cholecystitis without obstruction: Secondary | ICD-10-CM | POA: Diagnosis present

## 2015-03-16 DIAGNOSIS — R339 Retention of urine, unspecified: Secondary | ICD-10-CM | POA: Diagnosis not present

## 2015-03-16 DIAGNOSIS — R062 Wheezing: Secondary | ICD-10-CM

## 2015-03-16 DIAGNOSIS — K829 Disease of gallbladder, unspecified: Secondary | ICD-10-CM | POA: Diagnosis not present

## 2015-03-16 DIAGNOSIS — K819 Cholecystitis, unspecified: Secondary | ICD-10-CM

## 2015-03-16 DIAGNOSIS — H9193 Unspecified hearing loss, bilateral: Secondary | ICD-10-CM | POA: Diagnosis present

## 2015-03-16 DIAGNOSIS — F419 Anxiety disorder, unspecified: Secondary | ICD-10-CM | POA: Diagnosis present

## 2015-03-16 DIAGNOSIS — R634 Abnormal weight loss: Secondary | ICD-10-CM | POA: Diagnosis present

## 2015-03-16 DIAGNOSIS — Z96641 Presence of right artificial hip joint: Secondary | ICD-10-CM | POA: Diagnosis present

## 2015-03-16 DIAGNOSIS — E785 Hyperlipidemia, unspecified: Secondary | ICD-10-CM | POA: Diagnosis present

## 2015-03-16 DIAGNOSIS — G609 Hereditary and idiopathic neuropathy, unspecified: Secondary | ICD-10-CM | POA: Diagnosis present

## 2015-03-16 DIAGNOSIS — Z79899 Other long term (current) drug therapy: Secondary | ICD-10-CM

## 2015-03-16 DIAGNOSIS — K449 Diaphragmatic hernia without obstruction or gangrene: Secondary | ICD-10-CM | POA: Diagnosis present

## 2015-03-16 DIAGNOSIS — M79606 Pain in leg, unspecified: Secondary | ICD-10-CM | POA: Diagnosis present

## 2015-03-16 DIAGNOSIS — Z886 Allergy status to analgesic agent status: Secondary | ICD-10-CM | POA: Diagnosis not present

## 2015-03-16 DIAGNOSIS — M179 Osteoarthritis of knee, unspecified: Secondary | ICD-10-CM | POA: Diagnosis present

## 2015-03-16 DIAGNOSIS — R911 Solitary pulmonary nodule: Secondary | ICD-10-CM | POA: Diagnosis present

## 2015-03-16 DIAGNOSIS — K219 Gastro-esophageal reflux disease without esophagitis: Secondary | ICD-10-CM | POA: Diagnosis present

## 2015-03-16 DIAGNOSIS — Z888 Allergy status to other drugs, medicaments and biological substances status: Secondary | ICD-10-CM

## 2015-03-16 DIAGNOSIS — Z8249 Family history of ischemic heart disease and other diseases of the circulatory system: Secondary | ICD-10-CM

## 2015-03-16 DIAGNOSIS — M1710 Unilateral primary osteoarthritis, unspecified knee: Secondary | ICD-10-CM | POA: Diagnosis present

## 2015-03-16 DIAGNOSIS — I10 Essential (primary) hypertension: Secondary | ICD-10-CM | POA: Diagnosis present

## 2015-03-16 DIAGNOSIS — I341 Nonrheumatic mitral (valve) prolapse: Secondary | ICD-10-CM | POA: Diagnosis present

## 2015-03-16 DIAGNOSIS — J948 Other specified pleural conditions: Secondary | ICD-10-CM | POA: Diagnosis not present

## 2015-03-16 LAB — CBC WITH DIFFERENTIAL/PLATELET
BASOS ABS: 0 10*3/uL (ref 0.0–0.1)
Basophils Relative: 0 %
EOS ABS: 0.2 10*3/uL (ref 0.0–0.7)
Eosinophils Relative: 2 %
HCT: 41 % (ref 36.0–46.0)
HEMOGLOBIN: 13.5 g/dL (ref 12.0–15.0)
LYMPHS ABS: 1.8 10*3/uL (ref 0.7–4.0)
Lymphocytes Relative: 21 %
MCH: 30.9 pg (ref 26.0–34.0)
MCHC: 32.9 g/dL (ref 30.0–36.0)
MCV: 93.8 fL (ref 78.0–100.0)
Monocytes Absolute: 0.8 10*3/uL (ref 0.1–1.0)
Monocytes Relative: 9 %
NEUTROS PCT: 68 %
Neutro Abs: 5.7 10*3/uL (ref 1.7–7.7)
PLATELETS: 275 10*3/uL (ref 150–400)
RBC: 4.37 MIL/uL (ref 3.87–5.11)
RDW: 13.1 % (ref 11.5–15.5)
WBC: 8.4 10*3/uL (ref 4.0–10.5)

## 2015-03-16 LAB — COMPREHENSIVE METABOLIC PANEL
ALK PHOS: 63 U/L (ref 38–126)
ALT: 16 U/L (ref 14–54)
AST: 18 U/L (ref 15–41)
Albumin: 3.8 g/dL (ref 3.5–5.0)
Anion gap: 10 (ref 5–15)
BILIRUBIN TOTAL: 0.8 mg/dL (ref 0.3–1.2)
BUN: 19 mg/dL (ref 6–20)
CHLORIDE: 97 mmol/L — AB (ref 101–111)
CO2: 24 mmol/L (ref 22–32)
CREATININE: 0.75 mg/dL (ref 0.44–1.00)
Calcium: 9.3 mg/dL (ref 8.9–10.3)
GFR calc Af Amer: >60 mL/min (ref >60)
Glucose, Bld: 117 mg/dL — ABNORMAL HIGH (ref 65–99)
Potassium: 4.2 mmol/L (ref 3.5–5.1)
Sodium: 131 mmol/L — ABNORMAL LOW (ref 135–145)
TOTAL PROTEIN: 6.3 g/dL — AB (ref 6.5–8.1)

## 2015-03-16 LAB — LIPASE, BLOOD: LIPASE: 38 U/L (ref 11–51)

## 2015-03-16 LAB — URINALYSIS, ROUTINE W REFLEX MICROSCOPIC
Bilirubin Urine: NEGATIVE
Glucose, UA: NEGATIVE mg/dL
Hgb urine dipstick: NEGATIVE
Ketones, ur: NEGATIVE mg/dL
LEUKOCYTES UA: NEGATIVE
NITRITE: NEGATIVE
PROTEIN: NEGATIVE mg/dL
SPECIFIC GRAVITY, URINE: 1.013 (ref 1.005–1.030)
pH: 7 (ref 5.0–8.0)

## 2015-03-16 LAB — PROTIME-INR
INR: 1.09 (ref 0.00–1.49)
Prothrombin Time: 14.3 seconds (ref 11.6–15.2)

## 2015-03-16 LAB — APTT: APTT: 38 s — AB (ref 24–37)

## 2015-03-16 MED ORDER — PANTOPRAZOLE SODIUM 40 MG PO TBEC
40.0000 mg | DELAYED_RELEASE_TABLET | Freq: Every day | ORAL | Status: DC
  Administered 2015-03-16 – 2015-03-27 (×11): 40 mg via ORAL
  Filled 2015-03-16 (×11): qty 1

## 2015-03-16 MED ORDER — SODIUM CHLORIDE 0.9 % IV SOLN
INTRAVENOUS | Status: DC
  Administered 2015-03-16 – 2015-03-18 (×4): via INTRAVENOUS

## 2015-03-16 MED ORDER — METOPROLOL SUCCINATE ER 100 MG PO TB24
100.0000 mg | ORAL_TABLET | Freq: Every day | ORAL | Status: DC
  Administered 2015-03-16 – 2015-03-27 (×12): 100 mg via ORAL
  Filled 2015-03-16 (×3): qty 1
  Filled 2015-03-16: qty 2
  Filled 2015-03-16 (×2): qty 1
  Filled 2015-03-16 (×2): qty 2
  Filled 2015-03-16 (×2): qty 1
  Filled 2015-03-16 (×2): qty 2

## 2015-03-16 MED ORDER — HYDROMORPHONE HCL 1 MG/ML IJ SOLN
0.5000 mg | INTRAMUSCULAR | Status: DC | PRN
  Administered 2015-03-17 – 2015-03-24 (×8): 0.5 mg via INTRAVENOUS
  Filled 2015-03-16 (×8): qty 1

## 2015-03-16 MED ORDER — HEPARIN SODIUM (PORCINE) 5000 UNIT/ML IJ SOLN
5000.0000 [IU] | Freq: Three times a day (TID) | INTRAMUSCULAR | Status: DC
  Administered 2015-03-16 – 2015-03-17 (×3): 5000 [IU] via SUBCUTANEOUS
  Filled 2015-03-16 (×5): qty 1

## 2015-03-16 MED ORDER — OXYCODONE-ACETAMINOPHEN 5-325 MG PO TABS
1.0000 | ORAL_TABLET | ORAL | Status: DC | PRN
  Administered 2015-03-16: 2 via ORAL
  Administered 2015-03-17: 1 via ORAL
  Administered 2015-03-17 – 2015-03-20 (×4): 2 via ORAL
  Administered 2015-03-20 – 2015-03-21 (×2): 1 via ORAL
  Filled 2015-03-16 (×2): qty 1
  Filled 2015-03-16 (×3): qty 2
  Filled 2015-03-16: qty 1
  Filled 2015-03-16 (×2): qty 2

## 2015-03-16 MED ORDER — DOCUSATE SODIUM 100 MG PO CAPS
100.0000 mg | ORAL_CAPSULE | Freq: Two times a day (BID) | ORAL | Status: DC
  Administered 2015-03-16 – 2015-03-27 (×19): 100 mg via ORAL
  Filled 2015-03-16 (×22): qty 1

## 2015-03-16 MED ORDER — ACETAMINOPHEN 325 MG PO TABS
325.0000 mg | ORAL_TABLET | ORAL | Status: DC | PRN
  Administered 2015-03-24 – 2015-03-26 (×2): 650 mg via ORAL
  Filled 2015-03-16 (×3): qty 2

## 2015-03-16 MED ORDER — PREGABALIN 75 MG PO CAPS
75.0000 mg | ORAL_CAPSULE | Freq: Three times a day (TID) | ORAL | Status: DC
  Administered 2015-03-16 – 2015-03-27 (×32): 75 mg via ORAL
  Filled 2015-03-16 (×32): qty 1

## 2015-03-16 MED ORDER — DOXYLAMINE SUCCINATE (SLEEP) 25 MG PO TABS
25.0000 mg | ORAL_TABLET | Freq: Every evening | ORAL | Status: DC | PRN
  Filled 2015-03-16: qty 1

## 2015-03-16 MED ORDER — SERTRALINE HCL 100 MG PO TABS
100.0000 mg | ORAL_TABLET | Freq: Every day | ORAL | Status: DC
  Administered 2015-03-16 – 2015-03-26 (×11): 100 mg via ORAL
  Filled 2015-03-16 (×11): qty 1

## 2015-03-16 NOTE — Progress Notes (Signed)
  Echocardiogram 2D Echocardiogram has been performed.  Jennette Dubin 03/16/2015, 1:09 PM

## 2015-03-16 NOTE — Progress Notes (Signed)
03/16/2015 9:35 AM  Pt direct admit to 6E06 accompanied by her son with whom she lives.  Pt is fully alert and oriented, extremely hard of hearing despite hearing aides intact bilaterally.  Pt wears glasses.  Denies pain.  Vitals stable, full assessment to EPIC.  Skin intact but dry, heels reddened but blancheable.  Pt walks with cane at home, but does endorse at least two falls per year.  Pt is a high fall risk per protocol.  Pt and son educated on fall risk score and necessary interventions--both verbalized understanding.  Bed alarm turned on and bed in lowest position, yellow socks and arm band applied.  Pt and son oriented to room/unit, and was instructed on how to utilize the call bell, to which they verbalized understanding.  CCS PA paged for orders.  Awaiting further instruction. Princella Pellegrini

## 2015-03-16 NOTE — H&P (Signed)
[name] is an 81 y.o. female.   Chief Complaint:  abdominal pain HPI: 81 y/o who was seen in the ED on 03/01/15.  CT scan showed:  Interval development of left-sided pleural effusion associated with left basilar atelectasis. Coronary artery disease and mitral calcification.   Sludge and stones within the gallbladder. Small renal cyst, normal appendix and AAA.  [place] 03/01/15: shows multiple stones, no GB wall thickening or pericholecystic fluid, CBD 5.5 mm. She was back on 03/09/15 with pain.  Repeat US shows some GB wall thickening 1.6 mm, no sign of cholecystitis.  CXR shows a left apical mass, and left pleural effusion. Labs on 03/09/15 showed a normal UA, lipase, LFT's, and CBC.  Family has been calling office almost daily asking to be seen earlier for ongoing symptoms. She was seen by Dr. Barry Dienes on 03/05/15, and scheduled for surgery.  She continues to have symptoms and says she cannot stand it.    She was seen by PCP on 03/13/15, with back and abdominal pain, indegestion, not eating and ongoing weight loss.  She has been admitted by Dr. Barry Dienes now for further evaluation and treatment.   With the pleural effusion and hx of LUL nodule, we will get Medicine to see and clear for surgery.      Past Medical History  Diagnosis Date  . History of hiatal hernia   . Mitral valve prolapse   . Plantar fasciitis   . Hay fever   . GERD (gastroesophageal reflux disease)   . Palpitations   . Neuromuscular disorder ([place])   . Deafness     "very HOH; even w/hearing aides we have to communicate via writing things down for her to read" (08/25/2012)  . H/O hiatal hernia   . Arthritis     "a little all over" (08/25/2012)  . Lung mass     Hx: of  . Anxiety   . Hemorrhoids   . Neuropathy ([place])   . Hypertension   . Hyperglycemia     Past Surgical History  Procedure Laterality Date  . Foot surgery Bilateral ~ 2000    "pinched nerve on feet" (08/25/2012)  . Vesicovaginal fistula closure w/ tah    .  Abdominal hysterectomy  1998  . Tonsillectomy and adenoidectomy  1940's  . Cesarean section  1952  . Knee arthroscopy w/ meniscectomy Left 07/15/2010    partial medial and lateral meniscectomies.Archie Endo 07/15/2010 (08/25/2012)  . Hip arthroplasty Right 08/26/2012    Procedure: ARTHROPLASTY BIPOLAR HIP;  Surgeon: Mauri Pole, MD;  Location: [facility];  Service: Orthopedics;  Laterality: Right;  . Open reduction internal fixation (orif) distal radial fracture Right 09/11/2012    Procedure: OPEN REDUCTION INTERNAL FIXATION (ORIF) RIGHT DISTAL RADIAL FRACTURE/ULNAR POSSIBLE BONE GRAFTING;  Surgeon: Linna Hoff, MD;  Location: [facility];  Service: Orthopedics;  Laterality: Right;    Family History  Problem Relation Age of Onset  . Colon cancer Mother   . Colon cancer Sister   . Cancer Sister     Breast  . Colon cancer Brother   . Heart disease Brother     Heart Attack  . Cancer Father     Leukemia  . Pneumonia Sister    Social History:  reports that she has never smoked. She has never used smokeless tobacco. She reports that she does not drink alcohol or use illicit drugs.  Allergies:  Allergies  Allergen Reactions  . Statins Other (See Comments)    myopathy  .  Aleve [Naproxen Sodium] Swelling    Legs and feet    Medications Prior to Admission  Medication Sig Dispense Refill  . acetaminophen (TYLENOL) 325 MG tablet Take 325-650 mg by mouth every 4 (four) hours as needed for mild pain.    Marland Kitchen losartan (COZAAR) 50 MG tablet TAKE 1 TABLET (50 MG TOTAL) BY MOUTH DAILY. 30 tablet 9  . metoprolol succinate (TOPROL-XL) 100 MG 24 hr tablet TAKE ONE TABLET BY MOUTH ONCE DAILY. 30 tablet 5  . mometasone (NASONEX) 50 MCG/ACT nasal spray Place 1 spray into the nose as needed.    Marland Kitchen omeprazole (PRILOSEC) 20 MG capsule Take 20 mg by mouth daily.    . polyethylene glycol (MIRALAX / GLYCOLAX) packet Take 17 g by mouth daily as needed for mild constipation (constipation).     . potassium chloride  (K-DUR,KLOR-CON) 10 MEQ tablet Take 10 mEq by mouth daily.    . pregabalin (LYRICA) 75 MG capsule Take 1 capsule (75 mg total) by mouth 3 (three) times daily. 90 capsule 0  . sertraline (ZOLOFT) 100 MG tablet Take 1 tablet (100 mg total) by mouth at bedtime. 30 tablet 3    No results found for this or any previous visit (from the past 48 hour(s)). No results found.  Review of Systems  Constitutional: Positive for weight loss.       Everything has to be written out she cannot hear a thing.  So it is hard to get a through ROS, or history.  She just keeps telling me she can't enjoy Christmas and if she dies that's OK she's ready.    HENT: Positive for hearing loss (she cannot hear a word).   Eyes: Negative.   Respiratory: Positive for shortness of breath (all the time is what she says, doesn't look SOB and she is almost flat in bed.). Negative for wheezing.   Cardiovascular: Negative for chest pain.  Gastrointestinal: Positive for heartburn and abdominal pain. Negative for nausea, vomiting, diarrhea, constipation, blood in stool and melena.  Musculoskeletal: Negative.   Skin: Negative.   Neurological: Negative.   Psychiatric/Behavioral: Negative.     Blood pressure 133/60, pulse 70, temperature 98 F (36.7 C), temperature source Oral, resp. rate 18, SpO2 100 %. Physical Exam  Constitutional: She is oriented to person, place, and time. She appears well-developed and well-nourished. No distress.  HENT:  Head: Normocephalic.  Nose: Nose normal.  Eyes: Conjunctivae and EOM are normal. Right eye exhibits no discharge. Left eye exhibits no discharge. No scleral icterus.  Neck: Neck supple. No JVD present. No tracheal deviation present. No thyromegaly present.  Cardiovascular: Normal rate, regular rhythm, normal heart sounds and intact distal pulses.   No murmur heard. Respiratory: Effort normal and breath sounds normal. No respiratory distress. She has no wheezes. She has no rales. She  exhibits no tenderness.  i didn't really appreciate big change in base left vs right  GI: Soft. Bowel sounds are normal. She exhibits no distension and no mass. There is tenderness (she points to her mid epigastric area for pain, but on exam she is not tender to palpation when I saw her.). There is no rebound and no guarding.  Musculoskeletal: She exhibits no edema.  Lymphadenopathy:    She has no cervical adenopathy.  Neurological: She is alert and oriented to person, place, and time. A cranial nerve deficit is present.  Significant hearing loss both ears.  Skin: Skin is warm and dry. No rash noted. She is not diaphoretic.  No erythema. There is pallor.  Psychiatric: She has a normal mood and affect. Her behavior is normal. Judgment and thought content normal.     Assessment/Plan Symptomatic cholelithiasis Hard of hearing  Left pleural effusion Hx of lung nodule Hx of GERD Hiatal hernia Arthritis Hypertension  Plan:  We will look at her labs and get her cleared for surgery.  Pending findings surgery before she goes home for symptomatic cholelithiasis.    Brysen Shankman 03/16/2015, 9:33 AM

## 2015-03-16 NOTE — Consult Note (Signed)
[facility] Medical Consultation  [name] JHE:[id number] DOB: 1915/07/04 DOA: 03/16/2015 PCP: Hollace Kinnier, DO   Requesting physician: General Surgery, Will Theodis Sato Date of consultation: 03/16/2015 Reason for consultation: Cardiac clearance for surgery  Impression/Recommendations Principal Problem:   Symptomatic cholelithiasis Active Problems:   Pleural effusion, left   Hyperlipidemia   Essential hypertension, benign   GERD   Tricompartment degenerative joint disease of knee   Hereditary and idiopathic peripheral neuropathy   Pain in lower limb    Symptomatic Cholelithiasis  Management per Surgery.   Preoperative risk assessment / clearance  No angina, dyspnea, syncope, and palpitations, no history of hypertension, diabetes, chronic kidney disease, and cerebrovascular disease. Cardiac functional status : 1 METS Can take care of self, such as eat, dress, or use the toilet.  No longer able to go up stairs primarily due to knee pain. Is able to ambulate about the house. Preoperative ECG reviewed and shows normal sinus rhythm. No Q waves or significant ST-segment elevation or depression,, left ventricular hypertrophy, QTc prolongation, bundle-branch block, or arrhythmia.  On 2D echo, Grade 1 diastolic dysfunction with an LVEF of 65 - 70%.  Primarily due to age patient is a moderately high risk for cholecystectomy, but that understood, she is cleared for surgery.  Moderate left pleural effusion with hx of lung mass Patient is asymptomatic from the pleural effusion.  She describes no shortness of breath or cough. 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 Patient is followed by Dr. [name] for an enlarging lung mass that is presumed to be a slow growing malignancy.  She is not a candidate for lobectomy.  She declined chemo and radiation (per his notes).  For these reasons Dr. [name] did  not biopsy the mass.   Neuropathy Continue Lyrica   Anxiety Continue Zoloft   Hypertension Continue losartan, metoprolol, and resume Lasix as soon as appropriate.   Deafness  Please contact me if I can be of assistance in the meanwhile. Thank you for this consultation.  Chief Complaint: Symptomatic cholelithiasis  HPI:  This is a 81 year old female with peripheral neuropathy, deafness, hypertension, GERD, abdominal aortic aneurysm (3 cm)  and a lung mass presumed to be a slow growing malignancy. An abdominal/pelvis CT scan was done on December 1 that showed sludge and stones within the gallbladder.  She was seen by her PCP on 12/13. She was noted to be having severe abdominal pain, indigestion and a 6 lb weight loss - symptoms were felt to be due to her cholelithiasis.  The patient states she would rather die than wait until January for her scheduled cholecystectomy.  General surgery directly admitted the patient to the hospital and consulted [facility] for [facility] clearance.   Review of Systems:  Patient reports she is slightly constipated but denies any dysuria, diarrhea, melena. She complains of chronic pain in her feet but is still able to ambulate.  She denies fever, difficult breathing, cough, palpitations, chest pain. Review of Systems  Constitutional: Positive for weight loss.  HENT: Negative.   Eyes: Negative.   Respiratory: Negative for cough, hemoptysis, sputum production, shortness of breath and wheezing.   Cardiovascular: Negative for chest pain, palpitations and orthopnea.  Gastrointestinal: Positive for nausea and abdominal pain. Negative for vomiting, diarrhea, constipation and blood in stool.  Genitourinary: Negative.   Musculoskeletal: Negative.   Skin: Negative.   Neurological: Positive for weakness.  Endo/Heme/Allergies: Negative.   Psychiatric/Behavioral: Negative.  Past Medical History  Diagnosis Date  . History of hiatal hernia   . Mitral valve  prolapse   . Plantar fasciitis   . Hay fever   . GERD (gastroesophageal reflux disease)   . Palpitations   . Neuromuscular disorder ([place])   . Deafness     "very HOH; even w/hearing aides we have to communicate via writing things down for her to read" (08/25/2012)  . H/O hiatal hernia   . Arthritis     "a little all over" (08/25/2012)  . Lung mass     Hx: of  . Anxiety   . Hemorrhoids   . Neuropathy ([place])   . Hypertension   . Hyperglycemia    Past Surgical History  Procedure Laterality Date  . Foot surgery Bilateral ~ 2000    "pinched nerve on feet" (08/25/2012)  . Vesicovaginal fistula closure w/ tah    . Abdominal hysterectomy  1998  . Tonsillectomy and adenoidectomy  1940's  . Cesarean section  1952  . Knee arthroscopy w/ meniscectomy Left 07/15/2010    partial medial and lateral meniscectomies.Archie Endo 07/15/2010 (08/25/2012)  . Hip arthroplasty Right 08/26/2012    Procedure: ARTHROPLASTY BIPOLAR HIP;  Surgeon: Mauri Pole, MD;  Location: [facility];  Service: Orthopedics;  Laterality: Right;  . Open reduction internal fixation (orif) distal radial fracture Right 09/11/2012    Procedure: OPEN REDUCTION INTERNAL FIXATION (ORIF) RIGHT DISTAL RADIAL FRACTURE/ULNAR POSSIBLE BONE GRAFTING;  Surgeon: Linna Hoff, MD;  Location: [facility];  Service: Orthopedics;  Laterality: Right;   Social History:  reports that she has never smoked. She has never used smokeless tobacco. She reports that she does not drink alcohol or use illicit drugs.  Allergies  Allergen Reactions  . Statins Other (See Comments)    myopathy  . Aleve [Naproxen Sodium] Swelling    Legs and feet   Family History  Problem Relation Age of Onset  . Colon cancer Mother   . Colon cancer Sister   . Cancer Sister     Breast  . Colon cancer Brother   . Heart disease Brother     Heart Attack  . Cancer Father     Leukemia  . Pneumonia Sister     Prior to Admission medications   Medication Sig Start Date End Date  Taking? Authorizing Provider  acetaminophen (TYLENOL) 325 MG tablet Take 325-650 mg by mouth every 4 (four) hours as needed for mild pain.    Historical Provider, MD  losartan (COZAAR) 50 MG tablet TAKE 1 TABLET (50 MG TOTAL) BY MOUTH DAILY. 09/11/14   Tiffany L Reed, DO  metoprolol succinate (TOPROL-XL) 100 MG 24 hr tablet TAKE ONE TABLET BY MOUTH ONCE DAILY. 11/09/14   Tiffany L Reed, DO  mometasone (NASONEX) 50 MCG/ACT nasal spray Place 1 spray into the nose as needed.    Historical Provider, MD  omeprazole (PRILOSEC) 20 MG capsule Take 20 mg by mouth daily.    Historical Provider, MD  polyethylene glycol (MIRALAX / GLYCOLAX) packet Take 17 g by mouth daily as needed for mild constipation (constipation).     Historical Provider, MD  potassium chloride (K-DUR,KLOR-CON) 10 MEQ tablet Take 10 mEq by mouth daily.    Historical Provider, MD  pregabalin (LYRICA) 75 MG capsule Take 1 capsule (75 mg total) by mouth 3 (three) times daily. 03/12/15   Tiffany L Reed, DO  sertraline (ZOLOFT) 100 MG tablet Take 1 tablet (100 mg total) by mouth at bedtime. 03/12/15  Gayland Curry, DO   Physical Exam: Blood pressure 133/60, pulse 70, temperature 98 F (36.7 C), temperature source Oral, resp. rate 18, SpO2 100 %. Filed Vitals:   03/16/15 0927  BP: 133/60  Pulse: 70  Temp: 98 F (36.7 C)  Resp: 18     General:  Well-preserved, 81 year old female lying in bed in no apparent distress.  Eyes:  Sclera clear, conjunctiva pink, pupils are equal and round  ENT: Oropharynx is without erythema or exudate, nose and ears appear normal  Neck: Without lymphadenopathy, supple  Cardiovascular: Regular, without murmurs rubs or gallops. No lower extremity edema  Respiratory: Clear to auscultation, no wheezes crackles or rales  Abdomen: Nondistended, mildly tender to palpation near the umbilicus, no organomegaly, positive bowel sounds  Skin: No rash, bruises, lesions  Musculoskeletal: Able to move all 4  extremities, 5 over 5 strength in each  Psychiatric: Cooperative, appropriate, well-groomed.  Neurologic: No focal neuro deficits, cranial nerves II 12 are grossly intact.  Labs on Admission:  Basic Metabolic Panel:  Recent Labs Lab 03/16/15 1120  NA 131*  K 4.2  CL 97*  CO2 24  GLUCOSE 117*  BUN 19  CREATININE 0.75  CALCIUM 9.3   Liver Function Tests:  Recent Labs Lab 03/16/15 1120  AST 18  ALT 16  ALKPHOS 63  BILITOT 0.8  PROT 6.3*  ALBUMIN 3.8    Recent Labs Lab 03/16/15 1120  LIPASE 38   CBC:  Recent Labs Lab 03/16/15 1120  WBC 8.4  NEUTROABS 5.7  HGB 13.5  HCT 41.0  MCV 93.8  PLT 275    Radiological Exams on Admission: Dg Chest 2 View  03/16/2015  CLINICAL DATA:  Pleural effusions EXAM: CHEST  2 VIEW COMPARISON:  03/09/2015 FINDINGS: Moderate left pleural effusion. Left lower lobe atelectasis or infiltrate. Heart is borderline in size. No confluent opacity on the right. No acute bony abnormality. IMPRESSION: Moderate left pleural effusion. Left lower lobe atelectasis or infiltrate. Electronically Signed   By: Rolm Baptise M.D.   On: 03/16/2015 14:35    EKG: Independently reviewed. NSR.  No ST changes   Time spent: 45 min  Karen Kitchens [facility] Pager [phone]  If 7PM-7AM, please contact night-coverage [email or web address] Password [email or web address] 03/16/2015, 11:27 AM

## 2015-03-16 NOTE — Evaluation (Signed)
Physical Therapy Evaluation Patient Details Name: [name] MRN: [id number] DOB: 1915-08-03 Today's Date: 03/16/2015   History of Present Illness  81 y/o female with PMH of HTN, Anxiety, Deafness, Lung mass, recent recurrent h/o abdominal pains is admitted for cholecystectomy by surgery.  Clinical Impression  Pt admitted with the above complications, awaiting potential cholecystectomy; consult placed for PT pre-surgery evaluation. Ambulates generally well with a rolling walker up to 290 feet, SpO2 95% on room air, no dyspnea, HR in 70s. Required min assist for bed mobility and transfer. Son present and very supportive. Pt fairly independent with and assistive device at home. Denies any recent falls. Pt currently with functional limitations due to the deficits listed below (see PT Problem List).  Pt will benefit from skilled PT to increase their independence and safety with mobility to allow discharge to the venue listed below.       Follow Up Recommendations  (TBD)    Equipment Recommendations  None recommended by PT    Recommendations for Other Services       Precautions / Restrictions Precautions Precautions: Fall Restrictions Weight Bearing Restrictions: No      Mobility  Bed Mobility Overal bed mobility: Needs Assistance Bed Mobility: Supine to Sit;Sit to Supine     Supine to sit: Min assist Sit to supine: Supervision   General bed mobility comments: Min assist for patient to pull self up through PT's hand. VC for technique. back to supine without assist.   Transfers Overall transfer level: Needs assistance Equipment used: Rolling walker (2 wheeled) Transfers: Sit to/from Stand Sit to Stand: Min assist         General transfer comment: min assist for boost to stand from bed. rocks several times for momentum. Cues for hand placement.  Ambulation/Gait Ambulation/Gait assistance: Min guard Ambulation Distance (Feet): 290 Feet Assistive device: Rolling  walker (2 wheeled) Gait Pattern/deviations: Step-through pattern;Decreased stride length;Trunk flexed Gait velocity: slower Gait velocity interpretation: <1.8 ft/sec, indicative of risk for recurrent falls General Gait Details: Educated on safe DME use with a rolling walker. Cues for upright posture with forward gaze. No dyspnea. SpO2 95% on room air with HR to mid 70s during bout. One episode of instability due to head turns but able to self correct with assist from walker. Close guard for safety.  Stairs            Wheelchair Mobility    Modified Rankin (Stroke Patients Only)       Balance Overall balance assessment: Needs assistance Sitting-balance support: No upper extremity supported;Feet supported Sitting balance-Leahy Scale: Normal     Standing balance support: No upper extremity supported Standing balance-Leahy Scale: Fair                               Pertinent Vitals/Pain Pain Assessment: No/denies pain    Home Living Family/patient expects to be discharged to:: Private residence Living Arrangements: Children (Son) Available Help at Discharge: Family;Available 24 hours/day Type of Home: House Home Access: Stairs to enter Entrance Stairs-Rails: Psychiatric nurse of Steps: 4 Home Layout: Two level;Able to live on main level with bedroom/bathroom Home Equipment: Gilford Rile - 2 wheels;Cane - single point;Bedside commode;Shower seat;Grab bars - toilet;Grab bars - tub/shower      Prior Function Level of Independence: Needs assistance   Gait / Transfers Assistance Needed: independent with cane and occasionally RW  ADL's / Homemaking Assistance Needed: Son helps wash pt back  Hand Dominance        Extremity/Trunk Assessment   Upper Extremity Assessment: Defer to OT evaluation           Lower Extremity Assessment: Generalized weakness         Communication   Communication: HOH;Deaf (has a white board in room for  pt use)  Cognition Arousal/Alertness: Awake/alert Behavior During Therapy: WFL for tasks assessed/performed Overall Cognitive Status: Within Functional Limits for tasks assessed                      General Comments General comments (skin integrity, edema, etc.): Son present very supportive. States pt without any falls at home    Exercises        Assessment/Plan    PT Assessment Patient needs continued PT services  PT Diagnosis Abnormality of gait;Generalized weakness   PT Problem List Decreased strength;Decreased activity tolerance;Decreased balance;Decreased mobility;Decreased knowledge of use of DME  PT Treatment Interventions DME instruction;Gait training;Stair training;Functional mobility training;Therapeutic activities;Therapeutic exercise;Balance training;Patient/family education   PT Goals (Current goals can be found in the Care Plan section) Acute Rehab PT Goals Patient Stated Goal: To do well PT Goal Formulation: With patient Time For Goal Achievement: 03/30/15 Potential to Achieve Goals: Good    Frequency Min 3X/week   Barriers to discharge        Co-evaluation               End of Session   Activity Tolerance: Patient tolerated treatment well Patient left: in bed;with call bell/phone within reach;with bed alarm set;with family/visitor present Nurse Communication: Mobility status (SpO2)         Time: 6948-5462 PT Time Calculation (min) (ACUTE ONLY): 23 min   Charges:   PT Evaluation $Initial PT Evaluation Tier I: 1 Procedure PT Treatments $Gait Training: 8-22 mins   PT G CodesEllouise Newer 03/16/2015, 5:51 PM  Camille Bal Protivin, [place]

## 2015-03-17 MED ORDER — WHITE PETROLATUM GEL
Status: AC
  Administered 2015-03-17: 0.2
  Filled 2015-03-17: qty 1

## 2015-03-17 MED ORDER — ONDANSETRON HCL 4 MG/2ML IJ SOLN
4.0000 mg | Freq: Four times a day (QID) | INTRAMUSCULAR | Status: DC | PRN
  Administered 2015-03-17 – 2015-03-25 (×5): 4 mg via INTRAVENOUS
  Filled 2015-03-17 (×4): qty 2

## 2015-03-17 NOTE — Progress Notes (Signed)
  Subjective: Denies abdominal pain this morning Denies SOB  Objective: Vital signs in last 24 hours: Temp:  [97.5 F (36.4 C)-98.2 F (36.8 C)] 97.7 F (36.5 C) (12/17 0747) Pulse Rate:  [63-73] 70 (12/17 0747) Resp:  [16-18] 17 (12/17 0747) BP: (122-176)/(54-84) 176/82 mmHg (12/17 0747) SpO2:  [95 %-97 %] 95 % (12/17 0747) Weight:  [76 kg (167 lb 8.8 oz)] 76 kg (167 lb 8.8 oz) (12/16 2110) Last BM Date: 03/15/15  Intake/Output from previous day: 12/16 0701 - 12/17 0700 In: 1200 [P.O.:600; I.V.:600] Out: 1700 [Urine:1700] Intake/Output this shift: Total I/O In: 0  Out: 200 [Urine:200]  Exam: Lungs with decrease BS on left lower side Abdomen soft and non tender  Lab Results:   Recent Labs  03/16/15 1120  WBC 8.4  HGB 13.5  HCT 41.0  PLT 275   BMET  Recent Labs  03/16/15 1120  NA 131*  K 4.2  CL 97*  CO2 24  GLUCOSE 117*  BUN 19  CREATININE 0.75  CALCIUM 9.3   PT/INR  Recent Labs  03/16/15 1120  LABPROT 14.3  INR 1.09   ABG No results for input(s): PHART, HCO3 in the last 72 hours.  Invalid input(s): PCO2, PO2  Studies/Results: Dg Chest 2 View  03/16/2015  CLINICAL DATA:  Pleural effusions EXAM: CHEST  2 VIEW COMPARISON:  03/09/2015 FINDINGS: Moderate left pleural effusion. Left lower lobe atelectasis or infiltrate. Heart is borderline in size. No confluent opacity on the right. No acute bony abnormality. IMPRESSION: Moderate left pleural effusion. Left lower lobe atelectasis or infiltrate. Electronically Signed   By: Rolm Baptise M.D.   On: 03/16/2015 14:35    Anti-infectives: Anti-infectives    None      Assessment/Plan: s/p * No surgery found *  Symptomatic cholelithiasis Left pleural effusion, suspected malignant  Potential for lap chole on Monday.  She currently is stable from a pulmonary standpoint.  The effusion may be the cause of her left sided and back pain.  She may or may not need a thoracentesis before or after  surgery. Will allow po  LOS: 1 day    [name] A 03/17/2015

## 2015-03-18 LAB — SURGICAL PCR SCREEN
MRSA, PCR: NEGATIVE
Staphylococcus aureus: NEGATIVE

## 2015-03-18 MED ORDER — HEPARIN SODIUM (PORCINE) 5000 UNIT/ML IJ SOLN
5000.0000 [IU] | Freq: Three times a day (TID) | INTRAMUSCULAR | Status: AC
  Administered 2015-03-18 (×2): 5000 [IU] via SUBCUTANEOUS
  Filled 2015-03-18 (×2): qty 1

## 2015-03-18 NOTE — Progress Notes (Signed)
  Subjective: She wants to know why we are not doing her surgery today.  She said we should have known it was Sunday.  NO pain with clears.    Objective: Vital signs in last 24 hours: Temp:  [97 F (36.1 C)-98.2 F (36.8 C)] 98 F (36.7 C) (12/18 0809) Pulse Rate:  [64-67] 67 (12/18 0809) Resp:  [16-18] 17 (12/18 0809) BP: (129-174)/(54-71) 163/70 mmHg (12/18 0809) SpO2:  [94 %-97 %] 94 % (12/18 0809) Weight:  [75.894 kg (167 lb 5.1 oz)] 75.894 kg (167 lb 5.1 oz) (12/17 2044) Last BM Date: 03/15/15 480 PO Afebrile, VSS No labs but all have been stable so far Intake/Output from previous day: 12/17 0701 - 12/18 0700 In: 1080 [P.O.:480; I.V.:600] Out: 2500 [Urine:2500] Intake/Output this shift:    General appearance: alert, cooperative and no distress GI: soft, non-tender; bowel sounds normal; no masses,  no organomegaly  Lab Results:   Recent Labs  03/16/15 1120  WBC 8.4  HGB 13.5  HCT 41.0  PLT 275    BMET  Recent Labs  03/16/15 1120  NA 131*  K 4.2  CL 97*  CO2 24  GLUCOSE 117*  BUN 19  CREATININE 0.75  CALCIUM 9.3   PT/INR  Recent Labs  03/16/15 1120  LABPROT 14.3  INR 1.09     Recent Labs Lab 03/16/15 1120  AST 18  ALT 16  ALKPHOS 63  BILITOT 0.8  PROT 6.3*  ALBUMIN 3.8     Lipase     Component Value Date/Time   LIPASE 38 03/16/2015 1120     Studies/Results: Dg Chest 2 View  03/16/2015  CLINICAL DATA:  Pleural effusions EXAM: CHEST  2 VIEW COMPARISON:  03/09/2015 FINDINGS: Moderate left pleural effusion. Left lower lobe atelectasis or infiltrate. Heart is borderline in size. No confluent opacity on the right. No acute bony abnormality. IMPRESSION: Moderate left pleural effusion. Left lower lobe atelectasis or infiltrate. Electronically Signed   By: Rolm Baptise M.D.   On: 03/16/2015 14:35    Medications: . docusate sodium  100 mg Oral BID  . heparin  5,000 Units Subcutaneous 3 times per day  . metoprolol succinate  100 mg  Oral Daily  . pantoprazole  40 mg Oral Daily  . pregabalin  75 mg Oral TID  . sertraline  100 mg Oral QHS    Assessment/Plan Symptomatic cholelithiasis Hard of hearing  Left pleural effusion - this may be malignant Hx of lung nodule Hx of GERD Hiatal hernia Arthritis Hypertension Antibiotics:  None  DVT: heparin/SCD      Plan:  NPO after MN and DR.Tsuei and plan cholecystectomy tomorrow.     LOS: 2 days    [name] 03/18/2015

## 2015-03-19 ENCOUNTER — Encounter (HOSPITAL_COMMUNITY): Admission: AD | Disposition: A | Discharge status: Skilled Nursing Facility | Payer: Self-pay | Source: Ambulatory Visit

## 2015-03-19 ENCOUNTER — Inpatient Hospital Stay (HOSPITAL_COMMUNITY): Payer: Medicare Other

## 2015-03-19 ENCOUNTER — Encounter (HOSPITAL_COMMUNITY): Payer: Self-pay | Admitting: Anesthesiology

## 2015-03-19 ENCOUNTER — Inpatient Hospital Stay (HOSPITAL_COMMUNITY): Payer: Medicare Other | Admitting: Anesthesiology

## 2015-03-19 ENCOUNTER — Ambulatory Visit: Payer: Medicare Other | Admitting: Podiatry

## 2015-03-19 DIAGNOSIS — K829 Disease of gallbladder, unspecified: Secondary | ICD-10-CM | POA: Diagnosis present

## 2015-03-19 HISTORY — PX: CHOLECYSTECTOMY: SHX55

## 2015-03-19 HISTORY — PX: LAPAROSCOPIC CHOLECYSTECTOMY: SUR755

## 2015-03-19 SURGERY — LAPAROSCOPIC CHOLECYSTECTOMY WITH INTRAOPERATIVE CHOLANGIOGRAM
Anesthesia: General | Site: Abdomen

## 2015-03-19 MED ORDER — ONDANSETRON HCL 4 MG/2ML IJ SOLN
INTRAMUSCULAR | Status: AC
  Filled 2015-03-19: qty 2

## 2015-03-19 MED ORDER — FENTANYL CITRATE (PF) 100 MCG/2ML IJ SOLN: 25.0000 ug | INTRAMUSCULAR | Status: DC | PRN

## 2015-03-19 MED ORDER — LOSARTAN POTASSIUM 50 MG PO TABS
50.0000 mg | ORAL_TABLET | Freq: Every day | ORAL | Status: DC
  Administered 2015-03-20 – 2015-03-27 (×8): 50 mg via ORAL
  Filled 2015-03-19 (×8): qty 1

## 2015-03-19 MED ORDER — NEOSTIGMINE METHYLSULFATE 10 MG/10ML IV SOLN
INTRAVENOUS | Status: DC | PRN
  Administered 2015-03-19: 3 mg via INTRAVENOUS

## 2015-03-19 MED ORDER — CEFAZOLIN SODIUM-DEXTROSE 2-3 GM-% IV SOLR
2.0000 g | INTRAVENOUS | Status: AC
  Administered 2015-03-19: 2 g via INTRAVENOUS
  Filled 2015-03-19 (×2): qty 50

## 2015-03-19 MED ORDER — ACETAMINOPHEN 325 MG PO TABS: 325.0000 mg | ORAL_TABLET | ORAL | Status: DC | PRN

## 2015-03-19 MED ORDER — POTASSIUM CHLORIDE CRYS ER 20 MEQ PO TBCR
40.0000 meq | EXTENDED_RELEASE_TABLET | Freq: Every day | ORAL | Status: DC
  Administered 2015-03-20 – 2015-03-27 (×8): 40 meq via ORAL
  Filled 2015-03-19 (×8): qty 2

## 2015-03-19 MED ORDER — EPHEDRINE SULFATE 50 MG/ML IJ SOLN
INTRAMUSCULAR | Status: DC | PRN
  Administered 2015-03-19 (×2): 5 mg via INTRAVENOUS

## 2015-03-19 MED ORDER — FENTANYL CITRATE (PF) 250 MCG/5ML IJ SOLN
INTRAMUSCULAR | Status: AC
  Filled 2015-03-19: qty 5

## 2015-03-19 MED ORDER — SODIUM CHLORIDE 0.9 % IV SOLN
INTRAVENOUS | Status: DC | PRN
  Administered 2015-03-19: 30 mL

## 2015-03-19 MED ORDER — FENTANYL CITRATE (PF) 100 MCG/2ML IJ SOLN
INTRAMUSCULAR | Status: DC | PRN
  Administered 2015-03-19: 50 ug via INTRAVENOUS
  Administered 2015-03-19: 100 ug via INTRAVENOUS
  Administered 2015-03-19: 50 ug via INTRAVENOUS

## 2015-03-19 MED ORDER — PROPOFOL 10 MG/ML IV BOLUS
INTRAVENOUS | Status: AC
  Filled 2015-03-19: qty 20

## 2015-03-19 MED ORDER — 0.9 % SODIUM CHLORIDE (POUR BTL) OPTIME
TOPICAL | Status: DC | PRN
  Administered 2015-03-19: 1000 mL

## 2015-03-19 MED ORDER — BUPIVACAINE-EPINEPHRINE 0.25% -1:200000 IJ SOLN
INTRAMUSCULAR | Status: DC | PRN
  Administered 2015-03-19: 8 mL

## 2015-03-19 MED ORDER — GLYCOPYRROLATE 0.2 MG/ML IJ SOLN
INTRAMUSCULAR | Status: DC | PRN
  Administered 2015-03-19: 0.4 mg via INTRAVENOUS

## 2015-03-19 MED ORDER — SODIUM CHLORIDE 0.9 % IV SOLN
INTRAVENOUS | Status: DC
  Administered 2015-03-19: 15:00:00 via INTRAVENOUS

## 2015-03-19 MED ORDER — ROCURONIUM BROMIDE 100 MG/10ML IV SOLN
INTRAVENOUS | Status: DC | PRN
  Administered 2015-03-19: 40 mg via INTRAVENOUS

## 2015-03-19 MED ORDER — PROPOFOL 10 MG/ML IV BOLUS
INTRAVENOUS | Status: DC | PRN
  Administered 2015-03-19: 110 mg via INTRAVENOUS

## 2015-03-19 MED ORDER — BUPIVACAINE-EPINEPHRINE (PF) 0.25% -1:200000 IJ SOLN
INTRAMUSCULAR | Status: AC
  Filled 2015-03-19: qty 30

## 2015-03-19 MED ORDER — ACETAMINOPHEN 160 MG/5ML PO SOLN
325.0000 mg | ORAL | Status: DC | PRN
  Filled 2015-03-19: qty 20.3

## 2015-03-19 MED ORDER — BOOST / RESOURCE BREEZE PO LIQD
1.0000 | Freq: Three times a day (TID) | ORAL | Status: DC
  Administered 2015-03-20 – 2015-03-27 (×17): 1 via ORAL

## 2015-03-19 MED ORDER — SODIUM CHLORIDE 0.9 % IR SOLN
Status: DC | PRN
  Administered 2015-03-19: 1000 mL

## 2015-03-19 MED ORDER — OXYCODONE HCL 5 MG/5ML PO SOLN: 5.0000 mg | Freq: Once | ORAL | Status: DC | PRN

## 2015-03-19 MED ORDER — LIDOCAINE HCL (CARDIAC) 20 MG/ML IV SOLN
INTRAVENOUS | Status: AC
  Filled 2015-03-19: qty 5

## 2015-03-19 MED ORDER — GLYCOPYRROLATE 0.2 MG/ML IJ SOLN
INTRAMUSCULAR | Status: AC
  Filled 2015-03-19: qty 3

## 2015-03-19 MED ORDER — LACTATED RINGERS IV SOLN
INTRAVENOUS | Status: DC
  Administered 2015-03-19 (×2): via INTRAVENOUS

## 2015-03-19 MED ORDER — OXYCODONE HCL 5 MG PO TABS: 5.0000 mg | ORAL_TABLET | Freq: Once | ORAL | Status: DC | PRN

## 2015-03-19 MED ORDER — FUROSEMIDE 20 MG PO TABS
20.0000 mg | ORAL_TABLET | Freq: Every day | ORAL | Status: DC
  Administered 2015-03-20 – 2015-03-22 (×3): 20 mg via ORAL
  Filled 2015-03-19 (×3): qty 1

## 2015-03-19 MED ORDER — ONDANSETRON HCL 4 MG/2ML IJ SOLN
INTRAMUSCULAR | Status: DC | PRN
  Administered 2015-03-19: 4 mg via INTRAVENOUS

## 2015-03-19 MED ORDER — MICROFIBRILLAR COLL HEMOSTAT EX PADS
MEDICATED_PAD | CUTANEOUS | Status: DC | PRN
  Administered 2015-03-19: 1 via TOPICAL

## 2015-03-19 MED ORDER — NEOSTIGMINE METHYLSULFATE 10 MG/10ML IV SOLN
INTRAVENOUS | Status: AC
  Filled 2015-03-19: qty 1

## 2015-03-19 SURGICAL SUPPLY — 46 items
APL SKNCLS STERI-STRIP NONHPOA (GAUZE/BANDAGES/DRESSINGS) ×1
APPLIER CLIP ROT 10 11.4 M/L (STAPLE) ×3
APR CLP MED LRG 11.4X10 (STAPLE) ×1
BAG SPEC RTRVL LRG 6X4 10 (ENDOMECHANICALS) ×1
BENZOIN TINCTURE PRP APPL 2/3 (GAUZE/BANDAGES/DRESSINGS) ×3 IMPLANT
BLADE SURG ROTATE 9660 (MISCELLANEOUS) IMPLANT
CANISTER SUCTION 2500CC (MISCELLANEOUS) ×3 IMPLANT
CHLORAPREP W/TINT 26ML (MISCELLANEOUS) ×3 IMPLANT
CLIP APPLIE ROT 10 11.4 M/L (STAPLE) ×1 IMPLANT
CLOSURE STERI-STRIP 1/2X4 (GAUZE/BANDAGES/DRESSINGS) ×1
CLOSURE WOUND 1/2 X4 (GAUZE/BANDAGES/DRESSINGS) ×1
CLSR STERI-STRIP ANTIMIC 1/2X4 (GAUZE/BANDAGES/DRESSINGS) ×1 IMPLANT
COVER MAYO STAND STRL (DRAPES) ×3 IMPLANT
COVER SURGICAL LIGHT HANDLE (MISCELLANEOUS) ×3 IMPLANT
DRAPE C-ARM 42X72 X-RAY (DRAPES) ×3 IMPLANT
DRSG TEGADERM 2-3/8X2-3/4 SM (GAUZE/BANDAGES/DRESSINGS) ×5 IMPLANT
DRSG TEGADERM 4X4.75 (GAUZE/BANDAGES/DRESSINGS) ×3 IMPLANT
ELECT REM PT RETURN 9FT ADLT (ELECTROSURGICAL) ×3
ELECTRODE REM PT RTRN 9FT ADLT (ELECTROSURGICAL) ×1 IMPLANT
FILTER SMOKE EVAC LAPAROSHD (FILTER) ×3 IMPLANT
GAUZE SPONGE 2X2 8PLY STRL LF (GAUZE/BANDAGES/DRESSINGS) ×1 IMPLANT
GLOVE BIO SURGEON STRL SZ7 (GLOVE) ×3 IMPLANT
GLOVE BIOGEL PI IND STRL 7.5 (GLOVE) ×1 IMPLANT
GLOVE BIOGEL PI INDICATOR 7.5 (GLOVE) ×2
GOWN STRL REUS W/ TWL LRG LVL3 (GOWN DISPOSABLE) ×3 IMPLANT
GOWN STRL REUS W/TWL LRG LVL3 (GOWN DISPOSABLE) ×9
KIT BASIN OR (CUSTOM PROCEDURE TRAY) ×3 IMPLANT
KIT ROOM TURNOVER OR (KITS) ×3 IMPLANT
NS IRRIG 1000ML POUR BTL (IV SOLUTION) ×3 IMPLANT
PAD ARMBOARD 7.5X6 YLW CONV (MISCELLANEOUS) ×3 IMPLANT
POUCH SPECIMEN RETRIEVAL 10MM (ENDOMECHANICALS) ×3 IMPLANT
SCISSORS LAP 5X35 DISP (ENDOMECHANICALS) ×3 IMPLANT
SET CHOLANGIOGRAPH 5 50 .035 (SET/KITS/TRAYS/PACK) ×3 IMPLANT
SET IRRIG TUBING LAPAROSCOPIC (IRRIGATION / IRRIGATOR) ×3 IMPLANT
SLEEVE ENDOPATH XCEL 5M (ENDOMECHANICALS) ×3 IMPLANT
SPECIMEN JAR SMALL (MISCELLANEOUS) ×3 IMPLANT
SPONGE GAUZE 2X2 STER 10/PKG (GAUZE/BANDAGES/DRESSINGS) ×2
STRIP CLOSURE SKIN 1/2X4 (GAUZE/BANDAGES/DRESSINGS) ×2 IMPLANT
SUT MNCRL AB 4-0 PS2 18 (SUTURE) ×3 IMPLANT
TOWEL OR 17X24 6PK STRL BLUE (TOWEL DISPOSABLE) ×1 IMPLANT
TOWEL OR 17X26 10 PK STRL BLUE (TOWEL DISPOSABLE) ×5 IMPLANT
TRAY LAPAROSCOPIC MC (CUSTOM PROCEDURE TRAY) ×3 IMPLANT
TROCAR XCEL BLUNT TIP 100MML (ENDOMECHANICALS) ×3 IMPLANT
TROCAR XCEL NON-BLD 11X100MML (ENDOMECHANICALS) ×3 IMPLANT
TROCAR XCEL NON-BLD 5MMX100MML (ENDOMECHANICALS) ×3 IMPLANT
TUBING INSUFFLATION (TUBING) ×3 IMPLANT

## 2015-03-19 NOTE — Anesthesia Procedure Notes (Signed)
Procedure Name: Intubation Date/Time: 03/19/2015 12:36 PM Performed by: Rebekah Chesterfield L Pre-anesthesia Checklist: Patient identified, Emergency Drugs available, Suction available, Patient being monitored and Timeout performed Patient Re-evaluated:Patient Re-evaluated prior to inductionOxygen Delivery Method: Circle system utilized Preoxygenation: Pre-oxygenation with 100% oxygen Intubation Type: IV induction and Cricoid Pressure applied Ventilation: Mask ventilation without difficulty Laryngoscope Size: Mac and 4 Grade View: Grade III Tube type: Oral Tube size: 7.5 mm Number of attempts: 1 Airway Equipment and Method: Stylet Placement Confirmation: ETT inserted through vocal cords under direct vision,  positive ETCO2 and breath sounds checked- equal and bilateral Secured at: 21 cm Tube secured with: Tape Dental Injury: Teeth and Oropharynx as per pre-operative assessment

## 2015-03-19 NOTE — Op Note (Signed)
Laparoscopic Cholecystectomy with IOC Procedure Note  Indications: This patient presents with symptomatic gallbladder disease and will undergo laparoscopic cholecystectomy.  Pre-operative Diagnosis: Chronic calculus cholecystitis - symptomatic cholelithiasis  Post-operative Diagnosis: Same  Surgeon: Maycie Luera K.   Assistants: none  Anesthesia: General endotracheal anesthesia  ASA Class: 3  Procedure Details  The patient was seen again in the Holding Room. The risks, benefits, complications, treatment options, and expected outcomes were discussed with the patient. The possibilities of reaction to medication, pulmonary aspiration, perforation of viscus, bleeding, recurrent infection, finding a normal gallbladder, the need for additional procedures, failure to diagnose a condition, the possible need to convert to an open procedure, and creating a complication requiring transfusion or operation were discussed with the patient. The likelihood of improving the patient's symptoms with return to their baseline status is good.  The patient and/or family concurred with the proposed plan, giving informed consent. The site of surgery properly noted. The patient was taken to Operating Room, identified as [name] and the procedure verified as Laparoscopic Cholecystectomy with Intraoperative Cholangiogram. A Time Out was held and the above information confirmed.  Prior to the induction of general anesthesia, antibiotic prophylaxis was administered. General endotracheal anesthesia was then administered and tolerated well. After the induction, the abdomen was prepped with Chloraprep and draped in the sterile fashion. The patient was positioned in the supine position.  Local anesthetic agent was injected into the skin above the umbilicus and an incision made. We dissected down to the abdominal fascia with blunt dissection.  The fascia was incised vertically and we entered the peritoneal cavity  bluntly.  A pursestring suture of 0-Vicryl was placed around the fascial opening.  The Hasson cannula was inserted and secured with the stay suture.  Pneumoperitoneum was then created with CO2 and tolerated well without any adverse changes in the patient's vital signs. An 11-mm port was placed in the subxiphoid position.  Two 5-mm ports were placed in the right upper quadrant. All skin incisions were infiltrated with a local anesthetic agent before making the incision and placing the trocars.   We positioned the patient in reverse Trendelenburg, tilted slightly to the patient's left.  The gallbladder was identified, the fundus grasped and retracted cephalad. The gallbladder was thin-walled and very distended, but stones were visible through the gallbladder wall.  There were minimal adhesions to the gallbladder fundus.  Adhesions were lysed bluntly and with the electrocautery where indicated, taking care not to injure any adjacent organs or viscus. The infundibulum was grasped and retracted laterally, exposing the peritoneum overlying the triangle of Calot. This was then divided and exposed in a blunt fashion. A critical view of the cystic duct and cystic artery was obtained.  The cystic duct was clearly identified and bluntly dissected circumferentially. The cystic duct was ligated with a clip distally.   An incision was made in the cystic duct and the [facility] cholangiogram catheter introduced. The catheter was secured using a clip. A cholangiogram was then obtained which showed good visualization of the distal and proximal biliary tree with no sign of filling defects or obstruction.  Initially there was a small filling defect but this passed after repositioning the patient in reverse Trendelenburg and was felt to be an air bubble. Contrast flowed easily into the duodenum. The catheter was then removed.   The cystic duct was then ligated with clips and divided. The cystic artery was identified, dissected free,  ligated with clips and divided as well.   The  gallbladder was dissected from the liver bed in retrograde fashion with the electrocautery. The gallbladder was removed and placed in an Endocatch sac.  A small hole was inadvertently in the fundus as it was being removed.  Bile and some stones were spilled.  The bile was suctioned and the stones were picked up with the stone scoop. The liver bed was irrigated and inspected. Hemostasis was achieved with the electrocautery and surgicel SNOW. Copious irrigation was utilized and was repeatedly aspirated until clear.  The gallbladder and Endocatch sac were then removed through the umbilical port site.  The pursestring suture was used to close the umbilical fascia.    We again inspected the right upper quadrant for hemostasis.  Pneumoperitoneum was released as we removed the trocars.  4-0 Monocryl was used to close the skin.   Benzoin, steri-strips, and clean dressings were applied. The patient was then extubated and brought to the recovery room in stable condition. Instrument, sponge, and needle counts were correct at closure and at the conclusion of the case.   Findings: Cholecystitis with Cholelithiasis  Estimated Blood Loss: less than 50 mL         Drains: none         Specimens: Gallbladder           Complications: None; patient tolerated the procedure well.         Disposition: PACU - hemodynamically stable.         Condition: stable  Imogene Burn. Georgette Dover, MD, [facility] Surgery  General/ Trauma Surgery  03/19/2015 1:58 PM

## 2015-03-19 NOTE — Progress Notes (Signed)
PT Cancellation Note  Patient Details Name: [name] MRN: [id number] DOB: 1915-04-28   Cancelled Treatment:    Reason Eval/Treat Not Completed: Patient at procedure or test/unavailable (pt off floor in OR, lap chole.) PT to return as able.   Kingsley Callander 03/19/2015, 11:52 AM   Kittie Plater, PT, DPT Pager #: [phone] Office #: [phone]

## 2015-03-19 NOTE — Transfer of Care (Signed)
Immediate Anesthesia Transfer of Care Note  Patient: [name]  Procedure(s) Performed: Procedure(s): LAPAROSCOPIC CHOLECYSTECTOMY WITH INTRAOPERATIVE CHOLANGIOGRAM (N/A)  Patient Location: PACU  Anesthesia Type:General  Level of Consciousness: awake, alert , oriented and patient cooperative  Airway & Oxygen Therapy: Patient Spontanous Breathing and Patient connected to nasal cannula oxygen  Post-op Assessment: Report given to RN, Post -op Vital signs reviewed and stable and Patient moving all extremities  Post vital signs: Reviewed and stable  Last Vitals:  Filed Vitals:   03/19/15 0837 03/19/15 1404  BP: 166/75 143/83  Pulse: 80 71  Temp: 36.8 C   Resp: 17 13    Complications: No apparent anesthesia complications

## 2015-03-19 NOTE — Progress Notes (Signed)
  Subjective: Patient is ready for surgery - minimal RUQ discomfort NPO since midnight Cleared for surgery by IM   Objective: Vital signs in last 24 hours: Temp:  [97.6 F (36.4 C)-98.4 F (36.9 C)] 98.3 F (36.8 C) (12/19 0837) Pulse Rate:  [64-80] 80 (12/19 0837) Resp:  [16-18] 17 (12/19 0837) BP: (153-166)/(67-85) 166/75 mmHg (12/19 0837) SpO2:  [92 %-96 %] 92 % (12/19 0837) Last BM Date: 03/15/15  Intake/Output from previous day: 12/18 0701 - 12/19 0700 In: 1560 [P.O.:960; I.V.:600] Out: 2000 [Urine:2000] Intake/Output this shift:    WDWN in NAD Very hard of hearing Abd - soft, mild tenderness in RUQ; no palpable masses   Lab Results:   Recent Labs  03/16/15 1120  WBC 8.4  HGB 13.5  HCT 41.0  PLT 275   BMET  Recent Labs  03/16/15 1120  NA 131*  K 4.2  CL 97*  CO2 24  GLUCOSE 117*  BUN 19  CREATININE 0.75  CALCIUM 9.3   PT/INR  Recent Labs  03/16/15 1120  LABPROT 14.3  INR 1.09   ABG No results for input(s): PHART, HCO3 in the last 72 hours.  Invalid input(s): PCO2, PO2  Studies/Results: No results found.  Anti-infectives: Anti-infectives    None      Assessment/Plan: Chronic calculus cholecystitis with symptomatic cholelithiasis Although the patient is elderly, she is fairly symptomatic.  She has been cleared by IM.   Will plan laparoscopic cholecystectomy with cholangiogram today.   Will ask TCTS to see patient post-op regarding L lung mass, left sided effusion.  The surgical procedure has been discussed with the patient.  Potential risks, benefits, alternative treatments, and expected outcomes have been explained.  All of the patient's questions at this time have been answered.  The likelihood of reaching the patient's treatment goal is good.  The patient understand the proposed surgical procedure and wishes to proceed.    Wiley Magan K. 03/19/2015

## 2015-03-19 NOTE — Progress Notes (Signed)
UR COMPLETED  

## 2015-03-19 NOTE — Anesthesia Preprocedure Evaluation (Addendum)
Anesthesia Evaluation  Patient identified by MRN, date of birth, ID band Patient awake    Reviewed: Allergy & Precautions, NPO status , Patient's Chart, lab work & pertinent test results  History of Anesthesia Complications Negative for: history of anesthetic complications  Airway Mallampati: III  TM Distance: <3 FB Neck ROM: Full    Dental  (+) Teeth Intact   Pulmonary  Lung mass with left pleural effusion    + decreased breath sounds      Cardiovascular hypertension, Pt. on medications and Pt. on home beta blockers (-) Past MI and (-) CHF + Valvular Problems/Murmurs MVP  Rhythm:Regular     Neuro/Psych PSYCHIATRIC DISORDERS Anxiety  Neuromuscular disease    GI/Hepatic Neg liver ROS, hiatal hernia, GERD  ,gallstones   Endo/Other    Renal/GU negative Renal ROS     Musculoskeletal  (+) Arthritis ,   Abdominal   Peds  Hematology negative hematology ROS (+)   Anesthesia Other Findings   Reproductive/Obstetrics                           Anesthesia Physical Anesthesia Plan  ASA: III  Anesthesia Plan: General   Post-op Pain Management:    Induction: Intravenous  Airway Management Planned: Oral ETT  Additional Equipment: None  Intra-op Plan:   Post-operative Plan: Extubation in OR  Informed Consent: I have reviewed the patients History and Physical, chart, labs and discussed the procedure including the risks, benefits and alternatives for the proposed anesthesia with the patient or authorized representative who has indicated his/her understanding and acceptance.   Dental advisory given  Plan Discussed with: CRNA and Surgeon  Anesthesia Plan Comments:         Anesthesia Quick Evaluation

## 2015-03-19 NOTE — Care Management Important Message (Signed)
Important Message  Patient Details  Name: [name] MRN: [id number] Date of Birth: 1915/08/02   Medicare Important Message Given:  Yes    Angeliki Mates P Keanen Dohse 03/19/2015, 3:37 PM

## 2015-03-20 ENCOUNTER — Encounter (HOSPITAL_COMMUNITY): Payer: Self-pay | Admitting: Surgery

## 2015-03-20 ENCOUNTER — Inpatient Hospital Stay (HOSPITAL_COMMUNITY): Payer: Medicare Other

## 2015-03-20 DIAGNOSIS — J90 Pleural effusion, not elsewhere classified: Secondary | ICD-10-CM

## 2015-03-20 LAB — COMPREHENSIVE METABOLIC PANEL
ALK PHOS: 58 U/L (ref 38–126)
ALT: 35 U/L (ref 14–54)
AST: 43 U/L — ABNORMAL HIGH (ref 15–41)
Albumin: 2.8 g/dL — ABNORMAL LOW (ref 3.5–5.0)
Anion gap: 7 (ref 5–15)
BILIRUBIN TOTAL: 0.6 mg/dL (ref 0.3–1.2)
BUN: 11 mg/dL (ref 6–20)
CALCIUM: 8.8 mg/dL — AB (ref 8.9–10.3)
CO2: 26 mmol/L (ref 22–32)
CREATININE: 0.66 mg/dL (ref 0.44–1.00)
Chloride: 100 mmol/L — ABNORMAL LOW (ref 101–111)
Glucose, Bld: 135 mg/dL — ABNORMAL HIGH (ref 65–99)
Potassium: 4.3 mmol/L (ref 3.5–5.1)
Sodium: 133 mmol/L — ABNORMAL LOW (ref 135–145)
TOTAL PROTEIN: 5.3 g/dL — AB (ref 6.5–8.1)

## 2015-03-20 LAB — CBC
HCT: 36.6 % (ref 36.0–46.0)
Hemoglobin: 12.1 g/dL (ref 12.0–15.0)
MCH: 31.5 pg (ref 26.0–34.0)
MCHC: 33.1 g/dL (ref 30.0–36.0)
MCV: 95.3 fL (ref 78.0–100.0)
PLATELETS: 201 10*3/uL (ref 150–400)
RBC: 3.84 MIL/uL — AB (ref 3.87–5.11)
RDW: 13.5 % (ref 11.5–15.5)
WBC: 10.3 10*3/uL (ref 4.0–10.5)

## 2015-03-20 MED ORDER — FUROSEMIDE 10 MG/ML IJ SOLN
20.0000 mg | Freq: Once | INTRAMUSCULAR | Status: AC
  Administered 2015-03-20: 20 mg via INTRAVENOUS
  Filled 2015-03-20: qty 2

## 2015-03-20 MED ORDER — CETYLPYRIDINIUM CHLORIDE 0.05 % MT LIQD
7.0000 mL | Freq: Two times a day (BID) | OROMUCOSAL | Status: DC
  Administered 2015-03-20 – 2015-03-26 (×9): 7 mL via OROMUCOSAL

## 2015-03-20 MED ORDER — ALBUTEROL SULFATE (2.5 MG/3ML) 0.083% IN NEBU
2.5000 mg | INHALATION_SOLUTION | RESPIRATORY_TRACT | Status: DC | PRN
  Administered 2015-03-20 – 2015-03-21 (×2): 2.5 mg via RESPIRATORY_TRACT
  Filled 2015-03-20 (×2): qty 3

## 2015-03-20 MED ORDER — POLYETHYLENE GLYCOL 3350 17 G PO PACK
17.0000 g | PACK | Freq: Every day | ORAL | Status: DC
  Administered 2015-03-20 – 2015-03-22 (×2): 17 g via ORAL
  Filled 2015-03-20: qty 1

## 2015-03-20 MED ORDER — CHLORHEXIDINE GLUCONATE 0.12 % MT SOLN
15.0000 mL | Freq: Two times a day (BID) | OROMUCOSAL | Status: DC
  Administered 2015-03-20 – 2015-03-26 (×12): 15 mL via OROMUCOSAL
  Filled 2015-03-20 (×10): qty 15

## 2015-03-20 NOTE — Progress Notes (Signed)
Physical Therapy Treatment/Re-Evaluation Patient Details Name: [name] MRN: [id number] DOB: Aug 12, 1915 Today's Date: 03/20/2015    History of Present Illness 81 y/o female with PMH of HTN, Anxiety, Deafness, Lung mass, recent recurrent h/o abdominal pains is admitted for cholecystectomy by surgery.  Now s/p laparoscopic cholecystectomy.    PT Comments    Ms. [name] presents w/ increased generalized weakness and increased instability w/ ambulation following surgery.  Pt's daughter expresses concern regarding her brother's ability to provide assist to pt at d/c.  Please see general notes below for explanation of updated follow up recommendations.  SpO2 in the 80's ambulating on RA, 2 LPM O2 via [place] required to return to 90's.     Follow Up Recommendations  Home health PT;Supervision/Assistance - 24 hour;SNF (max out [facility] services to allow 24/7 supervision)     Equipment Recommendations  None recommended by PT    Recommendations for Other Services       Precautions / Restrictions Precautions Precautions: Fall Precaution Comments: pt is deaf; has white board for communication Restrictions Weight Bearing Restrictions: No    Mobility  Bed Mobility Overal bed mobility: Needs Assistance Bed Mobility: Supine to Sit     Supine to sit: Min guard     General bed mobility comments: Min guard for pt's safety.  Increased time and use of bed rail w/ HOB elevated.  Transfers Overall transfer level: Needs assistance Equipment used: Rolling walker (2 wheeled) Transfers: Sit to/from Stand Sit to Stand: Min assist         General transfer comment: Close min guard for pt's safety, min instability noted.  Cues to stand upright and cues for pursed lip breathing as SpO2 drops to 87%, returns back to 90's quickly.  Ambulation/Gait Ambulation/Gait assistance: Min guard Ambulation Distance (Feet): 100 Feet Assistive device: Rolling walker (2 wheeled) Gait Pattern/deviations:  Step-through pattern;Antalgic;Decreased stride length   Gait velocity interpretation: Below normal speed for age/gender General Gait Details: Min instability although no overt LOB.  Close min guard for pt's safety.  SpO2 down to 84% on RA, back up to 90% once 2 LPM O2 applied via [place].  Again, educated pt on pursed lip breathing.   Stairs            Wheelchair Mobility    Modified Rankin (Stroke Patients Only)       Balance Overall balance assessment: Needs assistance Sitting-balance support: Feet supported Sitting balance-Leahy Scale: Good     Standing balance support: Bilateral upper extremity supported;During functional activity Standing balance-Leahy Scale: Poor Standing balance comment: Min instability noted                     Cognition Arousal/Alertness: Awake/alert Behavior During Therapy: WFL for tasks assessed/performed Overall Cognitive Status: Within Functional Limits for tasks assessed                      Exercises      General Comments General comments (skin integrity, edema, etc.): Pt presents w/ increased generalized weakness and increased instability w/ ambulation following surgery.  Pt's daughter expresses concern regarding her brother's ability to provide assist to pt at d/c.  He is disable and leave pt alone at home at times.  Daughter reports that pt has been asking about assisted living and this PT encouraged family to continue that conversation as this will likely be the appropriate plan for pt.  Spoke w/ CM regarding these concerns and agreed that best scenario will  be for pt to return home w/ 24/7 supervision from family/HH services. Otherwise, pt may need SNF at d/c.      Pertinent Vitals/Pain Pain Assessment: Faces Faces Pain Scale: Hurts little more Pain Location: surgical site Pain Descriptors / Indicators: Operative site guarding;Grimacing;Moaning Pain Intervention(s): Limited activity within patient's tolerance;Monitored  during session;Repositioned    Home Living                      Prior Function            PT Goals (current goals can now be found in the care plan section) Acute Rehab PT Goals Patient Stated Goal: none stated PT Goal Formulation: With patient Time For Goal Achievement: 03/30/15 Potential to Achieve Goals: Good Progress towards PT goals: Progressing toward goals    Frequency  Min 3X/week    PT Plan Discharge plan needs to be updated    Co-evaluation             End of Session Equipment Utilized During Treatment: Gait belt;Oxygen Activity Tolerance: Patient limited by fatigue;Patient limited by pain Patient left: with call bell/phone within reach;with family/visitor present;in bed (sitting EOB)     Time: 1357-1430 PT Time Calculation (min) (ACUTE ONLY): 33 min  Charges:  $Gait Training: 8-22 mins                    G Codes:      Joslyn Hy PT, [place] [phone] Pager: [phone] 03/20/2015, 3:13 PM

## 2015-03-20 NOTE — Progress Notes (Signed)
Initial Nutrition Assessment  DOCUMENTATION CODES:   Non-severe (moderate) malnutrition in context of chronic illness  INTERVENTION:    Boost Breeze po TID, each supplement provides 250 kcal and 9 grams of protein  NUTRITION DIAGNOSIS:   Malnutrition related to chronic illness as evidenced by mild depletion of muscle mass, percent weight loss (5% weight loss within the past month).  GOAL:   Patient will meet greater than or equal to 90% of their needs  MONITOR:   Diet advancement, PO intake, Supplement acceptance, Weight trends, Labs  REASON FOR ASSESSMENT:   Malnutrition Screening Tool    ASSESSMENT:   Patient admitted on 12/16 with abdominal pain. S/P laparoscopic cholecystectomy 12/19 for chronic calculus cholecystitis - symptomatic cholelithiasis.  Nutrition-Focused physical exam completed. Findings are no fat depletion, mild muscle depletion, and no edema. Patient with ongoing weight loss, down 5% in the past month. Patient with moderate PCM. Currently on a clear liquid diet, c/o some nausea. Boost Breeze has been ordered TID.  Diet Order:  Diet clear liquid Room service appropriate?: Yes; Fluid consistency:: Thin  Skin:  Reviewed, no issues  Last BM:  12/16  Height:   Ht Readings from Last 1 Encounters:  03/19/15 '5\' 6"'$  (1.676 m)    Weight:   Wt Readings from Last 1 Encounters:  03/19/15 167 lb (75.751 kg)    Ideal Body Weight:  59.1 kg  BMI:  Body mass index is 26.97 kg/(m^2).  Estimated Nutritional Needs:   Kcal:  1550-1750  Protein:  85-95 gm  Fluid:  1.6-1.8 L  EDUCATION NEEDS:   No education needs identified at this time  Molli Barrows, [place], [place], [place] Pager [phone] After Hours Pager [phone]

## 2015-03-20 NOTE — Progress Notes (Signed)
1 Day Post-Op  Subjective: Patient has had some urinary retention overnight - only able to release small amounts Hasn't had a bowel movement in a few days, wants a laxative Reports some nausea, but was able to tolerate clears this morning Sore on right side of abdomen  Objective: Vital signs in last 24 hours: Temp:  [97.8 F (36.6 C)-98 F (36.7 C)] 97.8 F (36.6 C) (12/20 0700) Pulse Rate:  [64-86] 64 (12/20 0600) Resp:  [11-19] 11 (12/20 0600) BP: (109-196)/(58-92) 119/70 mmHg (12/20 0600) SpO2:  [91 %-96 %] 96 % (12/20 0600) Weight:  [75.751 kg (167 lb)] 75.751 kg (167 lb) (12/19 2300) Last BM Date: 03/16/15  Intake/Output from previous day: 12/19 0701 - 12/20 0700 In: 1476.7 [P.O.:120; I.V.:1356.7] Out: 200 [Urine:200] Intake/Output this shift:    General appearance: alert, cooperative, no distress and very hard of hearing Resp: clear to auscultation bilaterally GI: soft, tender around incisions Dressings c/d/i  Lab Results:   Recent Labs  03/20/15 0422  WBC 10.3  HGB 12.1  HCT 36.6  PLT 201   BMET  Recent Labs  03/20/15 0422  NA 133*  K 4.3  CL 100*  CO2 26  GLUCOSE 135*  BUN 11  CREATININE 0.66  CALCIUM 8.8*   Hepatic Function Latest Ref Rng 03/20/2015 03/16/2015 03/09/2015  Total Protein 6.5 - 8.1 g/dL 5.3(L) 6.3(L) 6.1(L)  Albumin 3.5 - 5.0 g/dL 2.8(L) 3.8 3.6  AST 15 - 41 U/L 43(H) 18 17  ALT 14 - 54 U/L 35 16 13(L)  Alk Phosphatase 38 - 126 U/L 58 63 55  Total Bilirubin 0.3 - 1.2 mg/dL 0.6 0.8 0.6  Bilirubin, Direct 0.0-0.3 mg/dL - - -     PT/INR No results for input(s): LABPROT, INR in the last 72 hours. ABG No results for input(s): PHART, HCO3 in the last 72 hours.  Invalid input(s): PCO2, PO2  Studies/Results: Dg Cholangiogram Operative  03/19/2015  CLINICAL DATA:  81 year old female with cholecystitis EXAM: INTRAOPERATIVE CHOLANGIOGRAM TECHNIQUE: Cholangiographic images from the C-arm fluoroscopic device were submitted for  interpretation post-operatively. Please see the procedural report for the amount of contrast and the fluoroscopy time utilized. COMPARISON:  Abdominal ultrasound 03/09/2015 FINDINGS: Cine images and multiple spot radiographs obtained during intraoperative cholangiogram at the time of laparoscopic cholecystectomy. The images demonstrate cannulation of the cystic duct remanent and opacification of the biliary tree. No evidence of biliary ductal dilatation, stenosis or stricture. No choledocholithiasis. There are a few mobile filling defects consistent with injected bubbles. Contrast material passes freely through the ampulla and into the duodenum. IMPRESSION: Negative intraoperative cholangiogram. Electronically Signed   By: Jacqulynn Cadet M.D.   On: 03/19/2015 13:52    Anti-infectives: Anti-infectives    Start     Dose/Rate Route Frequency Ordered Stop   03/19/15 0930  ceFAZolin (ANCEF) IVPB 2 g/50 mL premix     2 g 100 mL/hr over 30 Minutes Intravenous To Surgery 03/19/15 0915 03/19/15 1300      Assessment/Plan: s/p Procedure(s): LAPAROSCOPIC CHOLECYSTECTOMY WITH INTRAOPERATIVE CHOLANGIOGRAM (N/A) Place Foley for urinary retention  Continue clear liquids until nausea improves Miralax for constipation OOB to chair  We have asked thoracic surgery to offer recommendations on the left pleural effusion.  PT consult  LOS: 4 days    Shirlene Andaya K. 03/20/2015

## 2015-03-20 NOTE — Consult Note (Signed)
[address]       [place] [address]             [phone]      Cardiothoracic Surgery Consultation  Reason for Consult: Left pleural effusion and history of left lung nodule Referring Physician: Daralene Milch, MD  [name] is an 81 y.o. female.  HPI:   The patient is a 80 year old woman who I saw back in 12/2012 for a mass in the left lung apex found on a CT scan of the neck done in May 2014 after she suffered a fall. A CT of the chest on 08/25/2012 showed a 3.7 x 2.0 x 2.2 cm left apical lung mass. It was unchanged on CT done 12/08/2012. A PET scan on 01/06/2013 showed no change in the size of the mass but it did have some patchy hypermetabolic uptake with an SUV of 3.8. There was equivocal left hilar and right infrahilar nodal activity with SUV of 4.8 and 5.3, respectively. The background activity was 4.0. She has been a life-long nonsmoker. She was not felt to be a surgical candidate at that time due to her advanced age and was strongly against chemotherapy or XRT so a needle biopsy was not done. We decided to follow this for a while and a follow up CT in 06/2013 showed slight increase in the size of the left apical lung mass with possible satellite nodularity. She still did not want to pursue any therapy but her and her family wanted to continue following this. A repeat CT on 01/18/2014 showed further increase in the size of the lesion with extension toward the pleura and a satellite nodule in the left upper lobe as well as a subpleural nodule in the right upper lobe.   She was recently evaluated for abdominal pain and found to have cholelithiasis and gallbladder thickening and underwent laparoscopic cholecystectomy yesterday. A preop CXR showed a new small to moderate sized left pleural effusion.  Past Medical History  Diagnosis Date  . History of hiatal hernia   . Mitral valve prolapse   . Plantar fasciitis   . Hay fever   . GERD (gastroesophageal reflux  disease)   . Palpitations   . Neuromuscular disorder ([place])   . Deafness     "very HOH; even w/hearing aides we have to communicate via writing things down for her to read" (08/25/2012)  . H/O hiatal hernia   . Arthritis     "a little all over" (08/25/2012)  . Lung mass     Hx: of  . Anxiety   . Hemorrhoids   . Neuropathy ([place])   . Hypertension   . Hyperglycemia     Past Surgical History  Procedure Laterality Date  . Foot surgery Bilateral ~ 2000    "pinched nerve on feet" (08/25/2012)  . Vesicovaginal fistula closure w/ tah    . Abdominal hysterectomy  1998  . Tonsillectomy and adenoidectomy  1940's  . Cesarean section  1952  . Knee arthroscopy w/ meniscectomy Left 07/15/2010    partial medial and lateral meniscectomies.Archie Endo 07/15/2010 (08/25/2012)  . Hip arthroplasty Right 08/26/2012    Procedure: ARTHROPLASTY BIPOLAR HIP;  Surgeon: Mauri Pole, MD;  Location: [facility];  Service: Orthopedics;  Laterality: Right;  . Open reduction internal fixation (orif) distal radial fracture Right 09/11/2012    Procedure: OPEN REDUCTION INTERNAL FIXATION (ORIF) RIGHT DISTAL RADIAL FRACTURE/ULNAR POSSIBLE BONE GRAFTING;  Surgeon: Linna Hoff, MD;  Location: [facility];  Service: Orthopedics;  Laterality: Right;  . Cataract extraction, bilateral Bilateral   . Laparoscopic cholecystectomy  03/19/2015    Family History  Problem Relation Age of Onset  . Colon cancer Mother   . Colon cancer Sister   . Cancer Sister     Breast  . Colon cancer Brother   . Heart disease Brother     Heart Attack  . Cancer Father     Leukemia  . Pneumonia Sister     Social History:  reports that she has never smoked. She has never used smokeless tobacco. She reports that she does not drink alcohol or use illicit drugs.  Allergies:  Allergies  Allergen Reactions  . Statins Other (See Comments)    myopathy  . Aleve [Naproxen Sodium] Swelling    Legs and feet    Medications:  I have reviewed the patient's  current medications. Prior to Admission:  Prescriptions prior to admission  Medication Sig Dispense Refill Last Dose  . furosemide (LASIX) 20 MG tablet Take 20 mg by mouth daily.  3 03/16/2015 at Unknown time  . KLOR-CON M20 20 MEQ tablet Take 40 mEq by mouth daily.  6 03/16/2015 at Unknown time  . losartan (COZAAR) 50 MG tablet TAKE 1 TABLET (50 MG TOTAL) BY MOUTH DAILY. 30 tablet 9 03/16/2015 at Unknown time  . metoprolol succinate (TOPROL-XL) 100 MG 24 hr tablet TAKE ONE TABLET BY MOUTH ONCE DAILY. (Patient taking differently: TAKE 100 MG BY MOUTH ONCE DAILY) 30 tablet 5 03/16/2015 at 800  . metroNIDAZOLE (METROCREAM) 0.75 % cream Apply 1 application topically daily.   unknown  . Multiple Vitamin (MULTIVITAMIN WITH MINERALS) TABS tablet Take 1 tablet by mouth daily.   03/16/2015 at Unknown time  . pregabalin (LYRICA) 75 MG capsule Take 1 capsule (75 mg total) by mouth 3 (three) times daily. 90 capsule 0 03/16/2015 at Unknown time  . Probiotic Product (PROBIOTIC DAILY PO) Take 1 tablet by mouth daily.   03/16/2015 at Unknown time  . sertraline (ZOLOFT) 100 MG tablet Take 1 tablet (100 mg total) by mouth at bedtime. 30 tablet 3 03/15/2015 at Unknown time   Scheduled: . antiseptic oral rinse  7 mL Mouth Rinse q12n4p  . chlorhexidine  15 mL Mouth Rinse BID  . docusate sodium  100 mg Oral BID  . feeding supplement  1 Container Oral TID BM  . furosemide  20 mg Oral Daily  . losartan  50 mg Oral Daily  . metoprolol succinate  100 mg Oral Daily  . pantoprazole  40 mg Oral Daily  . polyethylene glycol  17 g Oral Daily  . potassium chloride SA  40 mEq Oral Daily  . pregabalin  75 mg Oral TID  . sertraline  100 mg Oral QHS   Continuous: . sodium chloride 50 mL/hr at 03/19/15 1452  . lactated ringers 50 mL/hr at 03/19/15 1210   EXB:[id number], HYDROmorphone (DILAUDID) injection, ondansetron (ZOFRAN) IV, oxyCODONE-acetaminophen Anti-infectives    Start     Dose/Rate Route Frequency Ordered  Stop   03/19/15 0930  ceFAZolin (ANCEF) IVPB 2 g/50 mL premix     2 g 100 mL/hr over 30 Minutes Intravenous To Surgery 03/19/15 0915 03/19/15 1300      Results for orders placed or performed during the hospital encounter of 03/16/15 (from the past 48 hour(s))  Surgical PCR screen     Status: None   Collection Time: 03/18/15  8:23 PM  Result Value Ref Range  MRSA, PCR NEGATIVE NEGATIVE   Staphylococcus aureus NEGATIVE NEGATIVE    Comment:        The Xpert SA Assay (FDA approved for NASAL specimens in patients over 47 years of age), is one component of a comprehensive surveillance program.  Test performance has been validated by [facility] for patients greater than or equal to 35 year old. It is not intended to diagnose infection nor to guide or monitor treatment.   Comprehensive metabolic panel     Status: Abnormal   Collection Time: 03/20/15  4:22 AM  Result Value Ref Range   Sodium 133 (L) 135 - 145 mmol/L   Potassium 4.3 3.5 - 5.1 mmol/L   Chloride 100 (L) 101 - 111 mmol/L   CO2 26 22 - 32 mmol/L   Glucose, Bld 135 (H) 65 - 99 mg/dL   BUN 11 6 - 20 mg/dL   Creatinine, Ser 0.66 0.44 - 1.00 mg/dL   Calcium 8.8 (L) 8.9 - 10.3 mg/dL   Total Protein 5.3 (L) 6.5 - 8.1 g/dL   Albumin 2.8 (L) 3.5 - 5.0 g/dL   AST 43 (H) 15 - 41 U/L   ALT 35 14 - 54 U/L   Alkaline Phosphatase 58 38 - 126 U/L   Total Bilirubin 0.6 0.3 - 1.2 mg/dL   GFR calc non Af Amer >60 >60 mL/min   GFR calc Af Amer >60 >60 mL/min    Comment: (NOTE) The eGFR has been calculated using the CKD EPI equation. This calculation has not been validated in all clinical situations. eGFR's persistently <60 mL/min signify possible Chronic Kidney Disease.    Anion gap 7 5 - 15  CBC     Status: Abnormal   Collection Time: 03/20/15  4:22 AM  Result Value Ref Range   WBC 10.3 4.0 - 10.5 K/uL   RBC 3.84 (L) 3.87 - 5.11 MIL/uL   Hemoglobin 12.1 12.0 - 15.0 g/dL   HCT 36.6 36.0 - 46.0 %   MCV 95.3 78.0 - 100.0 fL    MCH 31.5 26.0 - 34.0 pg   MCHC 33.1 30.0 - 36.0 g/dL   RDW 13.5 11.5 - 15.5 %   Platelets 201 150 - 400 K/uL    Dg Cholangiogram Operative  03/19/2015  CLINICAL DATA:  81 year old female with cholecystitis EXAM: INTRAOPERATIVE CHOLANGIOGRAM TECHNIQUE: Cholangiographic images from the C-arm fluoroscopic device were submitted for interpretation post-operatively. Please see the procedural report for the amount of contrast and the fluoroscopy time utilized. COMPARISON:  Abdominal ultrasound 03/09/2015 FINDINGS: Cine images and multiple spot radiographs obtained during intraoperative cholangiogram at the time of laparoscopic cholecystectomy. The images demonstrate cannulation of the cystic duct remanent and opacification of the biliary tree. No evidence of biliary ductal dilatation, stenosis or stricture. No choledocholithiasis. There are a few mobile filling defects consistent with injected bubbles. Contrast material passes freely through the ampulla and into the duodenum. IMPRESSION: Negative intraoperative cholangiogram. Electronically Signed   By: Jacqulynn Cadet M.D.   On: 03/19/2015 13:52   Essentially deaf. I had to write down questions for her and her answers are rambling. Review of Systems  Constitutional: Positive for weight loss.  HENT: Positive for hearing loss.   Respiratory: Positive for shortness of breath. Negative for cough and hemoptysis.   Cardiovascular: Negative for chest pain.  Gastrointestinal: Positive for abdominal pain.   Blood pressure 119/70, pulse 64, temperature 97.8 F (36.6 C), temperature source Oral, resp. rate 11, height _0  (1.676 m), weight 75.751  kg (167 lb), SpO2 96 %. Physical Exam  Constitutional:  Elderly woman in no distress. Lying flat in bed. Talks continuously without noticeable shortness of breath.  Neck: Normal range of motion. Neck supple.  Cardiovascular: Normal rate, regular rhythm and normal heart sounds.   Respiratory: Effort normal. No  respiratory distress.  Decreased breath sounds in left base.  GI: Soft.  Tender from surgery  Lymphadenopathy:    She has no cervical adenopathy.   CLINICAL DATA: Pleural effusions  EXAM: CHEST 2 VIEW  COMPARISON: 03/09/2015  FINDINGS: Moderate left pleural effusion. Left lower lobe atelectasis or infiltrate. Heart is borderline in size. No confluent opacity on the right. No acute bony abnormality.  IMPRESSION: Moderate left pleural effusion. Left lower lobe atelectasis or infiltrate.   Electronically Signed  By: Rolm Baptise M.D.  On: 03/16/2015 14:35   Assessment/Plan:  She has a slowly enlarging left apical lung mass with known satellite nodularity and hypermetabolic activity on prior PET scan suspicious for lung cancer. She is not an operative candidate to to advanced age and refused consideration of chemotherapy or XRT in the past. She was not biopsied since she refused any therapy and still does not want any therapy. She now has a small to moderate left pleural effusion that could be malignant. She does not seem symptomatic from this at the present time and is recovering from her cholecystectomy. If this effusion progresses then it would require thoracentesis for symptomatic relief and diagnostic cytology. I would not recommend doing anything at this time while she is recovering and it can be followed up in my office in a few weeks. Her family is not here at this time but they can call to schedule follow up once she goes home.  [name] 03/20/2015, 11:05 AM

## 2015-03-20 NOTE — Progress Notes (Addendum)
Physician notified: [place] At: 24  Regarding:  Dr Georgette Dover s/p lap chole. w lung mass and pleural effusion. NS'@50'$ , 2LPM O2. Now wheezing, slightly labored. No nebs, no Lasix ordered Awaiting return response.   1722: Dr Alena Bills. Saline lock PIV, 1V CXR, '20mg'$  IV lasix x1. PRN albuterol nebs Q4-wheeze.

## 2015-03-20 NOTE — Anesthesia Postprocedure Evaluation (Signed)
Anesthesia Post Note  Patient: [name]  Procedure(s) Performed: Procedure(s) (LRB): LAPAROSCOPIC CHOLECYSTECTOMY WITH INTRAOPERATIVE CHOLANGIOGRAM (N/A)  Patient location during evaluation: PACU Anesthesia Type: General Level of consciousness: awake Pain management: pain level controlled Vital Signs Assessment: post-procedure vital signs reviewed and stable Respiratory status: spontaneous breathing Cardiovascular status: stable Postop Assessment: no signs of nausea or vomiting Anesthetic complications: no    Last Vitals:  Filed Vitals:   03/20/15 0338 03/20/15 0413  BP:  136/68  Pulse:  74  Temp: 36.6 C   Resp:  17    Last Pain:  Filed Vitals:   03/20/15 0420  PainSc: 5                  [name]

## 2015-03-21 DIAGNOSIS — J90 Pleural effusion, not elsewhere classified: Secondary | ICD-10-CM

## 2015-03-21 DIAGNOSIS — E785 Hyperlipidemia, unspecified: Secondary | ICD-10-CM

## 2015-03-21 DIAGNOSIS — G609 Hereditary and idiopathic neuropathy, unspecified: Secondary | ICD-10-CM

## 2015-03-21 LAB — BASIC METABOLIC PANEL
Anion gap: 8 (ref 5–15)
BUN: 10 mg/dL (ref 6–20)
CALCIUM: 8.4 mg/dL — AB (ref 8.9–10.3)
CHLORIDE: 99 mmol/L — AB (ref 101–111)
CO2: 28 mmol/L (ref 22–32)
CREATININE: 0.7 mg/dL (ref 0.44–1.00)
GFR calc Af Amer: >60 mL/min (ref >60)
GFR calc non Af Amer: >60 mL/min (ref >60)
GLUCOSE: 160 mg/dL — AB (ref 65–99)
Potassium: 4.2 mmol/L (ref 3.5–5.1)
Sodium: 135 mmol/L (ref 135–145)

## 2015-03-21 LAB — CBC
HCT: 34.4 % — ABNORMAL LOW (ref 36.0–46.0)
Hemoglobin: 11.3 g/dL — ABNORMAL LOW (ref 12.0–15.0)
MCH: 31.2 pg (ref 26.0–34.0)
MCHC: 32.8 g/dL (ref 30.0–36.0)
MCV: 95 fL (ref 78.0–100.0)
PLATELETS: 181 10*3/uL (ref 150–400)
RBC: 3.62 MIL/uL — AB (ref 3.87–5.11)
RDW: 13.3 % (ref 11.5–15.5)
WBC: 9 10*3/uL (ref 4.0–10.5)

## 2015-03-21 MED ORDER — METHYLPREDNISOLONE SODIUM SUCC 40 MG IJ SOLR
40.0000 mg | Freq: Two times a day (BID) | INTRAMUSCULAR | Status: DC
  Administered 2015-03-21 – 2015-03-27 (×12): 40 mg via INTRAVENOUS
  Filled 2015-03-21 (×13): qty 1

## 2015-03-21 MED ORDER — LEVALBUTEROL HCL 0.63 MG/3ML IN NEBU
0.6300 mg | INHALATION_SOLUTION | Freq: Four times a day (QID) | RESPIRATORY_TRACT | Status: DC
  Administered 2015-03-21 – 2015-03-22 (×3): 0.63 mg via RESPIRATORY_TRACT
  Filled 2015-03-21 (×3): qty 3

## 2015-03-21 MED ORDER — HEPARIN SODIUM (PORCINE) 5000 UNIT/ML IJ SOLN
5000.0000 [IU] | Freq: Three times a day (TID) | INTRAMUSCULAR | Status: DC
  Administered 2015-03-21 – 2015-03-27 (×18): 5000 [IU] via SUBCUTANEOUS
  Filled 2015-03-21 (×18): qty 1

## 2015-03-21 MED ORDER — FUROSEMIDE 10 MG/ML IJ SOLN
40.0000 mg | Freq: Once | INTRAMUSCULAR | Status: AC
  Administered 2015-03-21: 40 mg via INTRAVENOUS
  Filled 2015-03-21: qty 4

## 2015-03-21 MED ORDER — FLEET ENEMA 7-19 GM/118ML RE ENEM
1.0000 | ENEMA | Freq: Once | RECTAL | Status: AC
  Administered 2015-03-21: 1 via RECTAL
  Filled 2015-03-21: qty 1

## 2015-03-21 MED ORDER — OXYCODONE-ACETAMINOPHEN 5-325 MG PO TABS
1.0000 | ORAL_TABLET | ORAL | Status: DC | PRN
  Administered 2015-03-21 – 2015-03-27 (×9): 1 via ORAL
  Filled 2015-03-21 (×9): qty 1

## 2015-03-21 NOTE — Progress Notes (Signed)
2 Days Post-Op  Subjective: She is wheezing some, but no further nausea, she wants more food.  She seems fairly comfortable.  We need to get her up more than once a day.    Objective: Vital signs in last 24 hours: Temp:  [97.8 F (36.6 C)-98.6 F (37 C)] 97.8 F (36.6 C) (12/21 0344) Pulse Rate:  [74-86] 74 (12/21 0345) Resp:  [14-19] 14 (12/21 0345) BP: (103-160)/(52-73) 103/52 mmHg (12/21 0345) SpO2:  [90 %-96 %] 95 % (12/21 0345) Last BM Date:  (prior to surgery) 480 PO recorded 2950 urine, negative fluid balance Afebrile, VSS No labs Intake/Output from previous day: 12/20 0701 - 12/21 0700 In: 480 [P.O.:480] Out: 2950 [Urine:2950] Intake/Output this shift:    General appearance: alert, cooperative and no distress Resp: mild wheezing she just had a rx 15 min before I came in.  Daughter says she was wheezing more earlier, this is not something they have seen before.  BS down some on the left. GI: soft, sore, port sites all look good.    Lab Results:   Recent Labs  03/20/15 0422  WBC 10.3  HGB 12.1  HCT 36.6  PLT 201    BMET  Recent Labs  03/20/15 0422  NA 133*  K 4.3  CL 100*  CO2 26  GLUCOSE 135*  BUN 11  CREATININE 0.66  CALCIUM 8.8*   PT/INR No results for input(s): LABPROT, INR in the last 72 hours.   Recent Labs Lab 03/16/15 1120 03/20/15 0422  AST 18 43*  ALT 16 35  ALKPHOS 63 58  BILITOT 0.8 0.6  PROT 6.3* 5.3*  ALBUMIN 3.8 2.8*     Lipase     Component Value Date/Time   LIPASE 38 03/16/2015 1120     Studies/Results: Dg Cholangiogram Operative  03/19/2015  CLINICAL DATA:  81 year old female with cholecystitis EXAM: INTRAOPERATIVE CHOLANGIOGRAM TECHNIQUE: Cholangiographic images from the C-arm fluoroscopic device were submitted for interpretation post-operatively. Please see the procedural report for the amount of contrast and the fluoroscopy time utilized. COMPARISON:  Abdominal ultrasound 03/09/2015 FINDINGS: Cine images and  multiple spot radiographs obtained during intraoperative cholangiogram at the time of laparoscopic cholecystectomy. The images demonstrate cannulation of the cystic duct remanent and opacification of the biliary tree. No evidence of biliary ductal dilatation, stenosis or stricture. No choledocholithiasis. There are a few mobile filling defects consistent with injected bubbles. Contrast material passes freely through the ampulla and into the duodenum. IMPRESSION: Negative intraoperative cholangiogram. Electronically Signed   By: Jacqulynn Cadet M.D.   On: 03/19/2015 13:52   Dg Chest Port 1 View  03/20/2015  CLINICAL DATA:  Wheezing and cough since this morning, gallbladder surgery yesterday, history hypertension, hiatal hernia EXAM: PORTABLE CHEST 1 VIEW COMPARISON:  Portable exam 1756 hours compared 03/16/2015 FINDINGS: Upper normal heart size. Stable mediastinal contours and pulmonary vascularity. Moderate LEFT pleural effusion increased since previous exam. Increased LEFT basilar atelectasis. Persistent bronchitic changes. RIGHT lung clear. No pneumothorax. Bones demineralized with posttraumatic deformity of the proximal RIGHT humerus. IMPRESSION: Increased LEFT pleural effusion and basilar atelectasis since previous exam. Electronically Signed   By: Lavonia Dana M.D.   On: 03/20/2015 18:09    Medications: . antiseptic oral rinse  7 mL Mouth Rinse q12n4p  . chlorhexidine  15 mL Mouth Rinse BID  . docusate sodium  100 mg Oral BID  . feeding supplement  1 Container Oral TID BM  . furosemide  20 mg Oral Daily  . losartan  50  mg Oral Daily  . metoprolol succinate  100 mg Oral Daily  . pantoprazole  40 mg Oral Daily  . polyethylene glycol  17 g Oral Daily  . potassium chloride SA  40 mEq Oral Daily  . pregabalin  75 mg Oral TID  . sertraline  100 mg Oral QHS    Assessment/Plan Symptomatic cholelithiasis S/p laparoscopic cholecystectomy with IOC 03/19/15 Hard of hearing  Left pleural  effusion Left apical lung mass with satellite hypermetabolic nodule Hx of GERD Hiatal hernia Arthritis Hypertension Antibiotics:  None DVT:  Heparin/scd   Plan:  Advance diet, mobilize more and work to get her off O2.  Case manager to see and help with dc planning.     LOS: 5 days    [name] 03/21/2015

## 2015-03-21 NOTE — Consult Note (Signed)
[facility] Medical Consultation  [name] SWH:[id number] DOB: 29-Oct-1915 DOA: 03/16/2015 PCP: Hollace Kinnier, DO   Requesting physician: Modena Jansky, PA-C Date of consultation: 03/21/2015 Reason for consultation: Assistance with general medical management specifically shortness of breath and wheezing  Impression/Recommendations Principal Problem:   Symptomatic cholelithiasis Active Problems:   Pleural effusion, left   Hyperlipidemia   Essential hypertension, benign   GERD   Tricompartment degenerative joint disease of knee   Hereditary and idiopathic peripheral neuropathy   Pain in lower limb   Gallbladder disease   Cholelithiasis S/P cholecystectomy on 12/19.  Management per primary service.  Left pleural effusion now with bilateral wheeze Now increased in comparison with previous xray on 12/19.  This is likely due to IV fluids she has received since admission.  Dr. [name] of cardiothoracic surgery consulted on 12/20 and recommended outpatient follow up in the next few weeks.  Stop IV fluids.  Give 40 mg IV lasix today in addition to her usual dose of 20 mg PO.  CXR in the morning.  If cxr has not improved would speak with Dr. [name] to give him the option of doing an inpatient thoracentesis.  Will also treat acute wheezing with scheduled nebulizers, and low dose solumedrol.  Constipation Patient has been constipated since prior to admission.  Give fleet enema today.  Continue stool softeners.  Neuropathy Continue Lyrica   Anxiety Continue Zoloft   Hypertension Continue losartan, metoprolol, and resume Lasix as soon as appropriate.   Deafness Patient uses a white board to communicate.   [facility] will followup again tomorrow. Please contact me if I can be of assistance in the meanwhile. Thank you for this consultation.  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 An abdominal/pelvis CT scan was done on December 1 that showed sludge and stones within the gallbladder. She was seen by her PCP on 12/13. She was noted to be having severe abdominal pain, indigestion and a 6 lb weight loss - symptoms were felt to be due to her cholelithiasis. On 12/19 the patient successfully underwent cholecystectomy.  Today she was noted to be wheezing and more short of breath. [facility] was consulted for assistance with general medical management.   Review of Systems:  Review of Systems  HENT: Negative.   Eyes: Negative.   Respiratory: Positive for shortness of breath and wheezing.   Cardiovascular: Negative.   Gastrointestinal: Positive for abdominal pain and constipation.  Genitourinary: Negative.   Musculoskeletal: Negative.   Skin: Negative.   Neurological: Positive for weakness.  Endo/Heme/Allergies: Negative.   Psychiatric/Behavioral: Negative.      Past Medical History  Diagnosis Date  . History of hiatal hernia   . Mitral valve prolapse   . Plantar fasciitis   . Hay fever   . GERD (gastroesophageal reflux disease)   . Palpitations   . Neuromuscular disorder ([place])   . Deafness     "very HOH; even w/hearing aides we have to communicate via writing things down for her to read" (08/25/2012)  . H/O hiatal hernia   . Arthritis     "a little all over" (08/25/2012)  . Lung mass     Hx: of  . Anxiety   . Hemorrhoids   . Neuropathy ([place])   . Hypertension   . Hyperglycemia    Past Surgical History  Procedure Laterality Date  . Foot surgery Bilateral ~ 2000    "pinched nerve on feet" (08/25/2012)  . Vesicovaginal fistula closure w/ tah    . Abdominal hysterectomy  1998  . Tonsillectomy and adenoidectomy  1940's  . Cesarean section  1952  . Knee arthroscopy w/ meniscectomy Left 07/15/2010    partial medial and lateral meniscectomies.Archie Endo  07/15/2010 (08/25/2012)  . Hip arthroplasty Right 08/26/2012    Procedure: ARTHROPLASTY BIPOLAR HIP;  Surgeon: Mauri Pole, MD;  Location: [facility];  Service: Orthopedics;  Laterality: Right;  . Open reduction internal fixation (orif) distal radial fracture Right 09/11/2012    Procedure: OPEN REDUCTION INTERNAL FIXATION (ORIF) RIGHT DISTAL RADIAL FRACTURE/ULNAR POSSIBLE BONE GRAFTING;  Surgeon: Linna Hoff, MD;  Location: [facility];  Service: Orthopedics;  Laterality: Right;  . Cataract extraction, bilateral Bilateral   . Laparoscopic cholecystectomy  03/19/2015  . Cholecystectomy N/A 03/19/2015    Procedure: LAPAROSCOPIC CHOLECYSTECTOMY WITH INTRAOPERATIVE CHOLANGIOGRAM;  Surgeon: Donnie Mesa, MD;  Location: [facility];  Service: General;  Laterality: N/A;   Social History:  reports that she has never smoked. She has never used smokeless tobacco. She reports that she does not drink alcohol or use illicit drugs.  Allergies  Allergen Reactions  . Statins Other (See Comments)    myopathy  . Aleve [Naproxen Sodium] Swelling    Legs and feet   Family History  Problem Relation Age of Onset  . Colon cancer Mother   . Colon cancer Sister   . Cancer Sister     Breast  . Colon cancer Brother   . Heart disease Brother     Heart Attack  . Cancer Father     Leukemia  . Pneumonia Sister     Prior to Admission medications   Medication Sig Start Date End Date Taking? Authorizing Provider  furosemide (LASIX) 20 MG tablet Take 20 mg by mouth daily. 03/06/15  Yes Historical Provider, MD  KLOR-CON M20 20 MEQ tablet Take 40 mEq by mouth daily. 02/10/15  Yes Historical Provider, MD  losartan (COZAAR) 50 MG tablet TAKE 1 TABLET (50 MG TOTAL) BY MOUTH DAILY. 09/11/14  Yes Tiffany L Reed, DO  metoprolol succinate (TOPROL-XL) 100 MG 24 hr tablet TAKE ONE TABLET BY MOUTH ONCE DAILY. Patient taking differently: TAKE 100 MG BY MOUTH ONCE DAILY 11/09/14  Yes Tiffany L Reed, DO  metroNIDAZOLE (METROCREAM) 0.75 %  cream Apply 1 application topically daily. 03/08/15  Yes Historical Provider, MD  Multiple Vitamin (MULTIVITAMIN WITH MINERALS) TABS tablet Take 1 tablet by mouth daily.   Yes Historical Provider, MD  pregabalin (LYRICA) 75 MG capsule Take 1 capsule (75 mg total) by mouth 3 (three) times daily. 03/12/15  Yes Tiffany L Reed, DO  Probiotic Product (PROBIOTIC DAILY PO) Take 1 tablet by mouth daily.   Yes Historical Provider, MD  sertraline (ZOLOFT) 100 MG tablet Take 1 tablet (100 mg total) by mouth at bedtime. 03/12/15  Yes Gayland Curry, DO   Physical Exam: Blood pressure 112/55, pulse 80, temperature 98.6 F (37 C), temperature source Oral, resp. rate 18, height '5\' 6"'$  (1.676 m), weight 75.751 kg (167 lb), SpO2 95 %. Filed Vitals:   03/21/15 0800 03/21/15 1138  BP:  112/55  Pulse:  80  Temp: 98.7 F (37.1 C) 98.6 F (37 C)  Resp:  18     General:  Pleasant, wd 81 yo female,  Family at bedside  Eyes: PER, sclera clear  ENT: No exudates or erythema  in her oropharynx  Neck: Supple  Cardiovascular: Regular rate and rhythm, no murmurs rubs or gallops, no lower extremity edema  Respiratory: Respiratory wheeze bilaterally, no increased work of breathing  Abdomen: Soft, active bowel sounds, surgical incisions appear to be healing well with no erythema  Skin: No rashes, bruises, or lesions other than surgical incisions.  Musculoskeletal: Able to move all 4 extremities, 5 over 5 strength in each  Psychiatric: Very hard of hearing but awake, cooperative, appropriate  Neurologic: Cranial nerves II through XII grossly intact, no acute focal neuro deficits   Labs on Admission:  Basic Metabolic Panel:  Recent Labs Lab 03/16/15 1120 03/20/15 0422  NA 131* 133*  K 4.2 4.3  CL 97* 100*  CO2 24 26  GLUCOSE 117* 135*  BUN 19 11  CREATININE 0.75 0.66  CALCIUM 9.3 8.8*   Liver Function Tests:  Recent Labs Lab 03/16/15 1120 03/20/15 0422  AST 18 43*  ALT 16 35  ALKPHOS 63  58  BILITOT 0.8 0.6  PROT 6.3* 5.3*  ALBUMIN 3.8 2.8*    Recent Labs Lab 03/16/15 1120  LIPASE 38   CBC:  Recent Labs Lab 03/16/15 1120 03/20/15 0422  WBC 8.4 10.3  NEUTROABS 5.7  --   HGB 13.5 12.1  HCT 41.0 36.6  MCV 93.8 95.3  PLT 275 201    Radiological Exams on Admission: Dg Chest Port 1 View  03/20/2015  CLINICAL DATA:  Wheezing and cough since this morning, gallbladder surgery yesterday, history hypertension, hiatal hernia EXAM: PORTABLE CHEST 1 VIEW COMPARISON:  Portable exam 1756 hours compared 03/16/2015 FINDINGS: Upper normal heart size. Stable mediastinal contours and pulmonary vascularity. Moderate LEFT pleural effusion increased since previous exam. Increased LEFT basilar atelectasis. Persistent bronchitic changes. RIGHT lung clear. No pneumothorax. Bones demineralized with posttraumatic deformity of the proximal RIGHT humerus. IMPRESSION: Increased LEFT pleural effusion and basilar atelectasis since previous exam. Electronically Signed   By: Lavonia Dana M.D.   On: 03/20/2015 18:09    EKG: Independently reviewed. NSR, no prolonged QT.  Time spent: 45 min.  Karen Kitchens [facility] Pager [phone]  If 7PM-7AM, please contact night-coverage [email or web address] Password [facility] 03/21/2015, 2:29 PM

## 2015-03-22 ENCOUNTER — Inpatient Hospital Stay (HOSPITAL_COMMUNITY): Payer: Medicare Other

## 2015-03-22 DIAGNOSIS — K8020 Calculus of gallbladder without cholecystitis without obstruction: Secondary | ICD-10-CM

## 2015-03-22 DIAGNOSIS — J948 Other specified pleural conditions: Secondary | ICD-10-CM

## 2015-03-22 DIAGNOSIS — K829 Disease of gallbladder, unspecified: Secondary | ICD-10-CM

## 2015-03-22 DIAGNOSIS — I10 Essential (primary) hypertension: Secondary | ICD-10-CM

## 2015-03-22 LAB — CBC
HCT: 37.7 % (ref 36.0–46.0)
HEMOGLOBIN: 12.3 g/dL (ref 12.0–15.0)
MCH: 30.9 pg (ref 26.0–34.0)
MCHC: 32.6 g/dL (ref 30.0–36.0)
MCV: 94.7 fL (ref 78.0–100.0)
Platelets: 222 10*3/uL (ref 150–400)
RBC: 3.98 MIL/uL (ref 3.87–5.11)
RDW: 13.2 % (ref 11.5–15.5)
WBC: 8.3 10*3/uL (ref 4.0–10.5)

## 2015-03-22 LAB — BASIC METABOLIC PANEL
Anion gap: 10 (ref 5–15)
BUN: 20 mg/dL (ref 6–20)
CHLORIDE: 95 mmol/L — AB (ref 101–111)
CO2: 29 mmol/L (ref 22–32)
CREATININE: 0.78 mg/dL (ref 0.44–1.00)
Calcium: 9 mg/dL (ref 8.9–10.3)
GFR calc Af Amer: >60 mL/min (ref >60)
GFR calc non Af Amer: >60 mL/min (ref >60)
GLUCOSE: 169 mg/dL — AB (ref 65–99)
POTASSIUM: 4.2 mmol/L (ref 3.5–5.1)
Sodium: 134 mmol/L — ABNORMAL LOW (ref 135–145)

## 2015-03-22 MED ORDER — LEVALBUTEROL HCL 0.63 MG/3ML IN NEBU
0.6300 mg | INHALATION_SOLUTION | Freq: Four times a day (QID) | RESPIRATORY_TRACT | Status: DC | PRN
  Administered 2015-03-22: 0.63 mg via RESPIRATORY_TRACT
  Filled 2015-03-22: qty 3

## 2015-03-22 MED ORDER — FUROSEMIDE 20 MG PO TABS
20.0000 mg | ORAL_TABLET | Freq: Every day | ORAL | Status: DC
  Administered 2015-03-24 – 2015-03-27 (×4): 20 mg via ORAL
  Filled 2015-03-22 (×4): qty 1

## 2015-03-22 MED ORDER — FUROSEMIDE 10 MG/ML IJ SOLN
40.0000 mg | Freq: Once | INTRAMUSCULAR | Status: AC
  Administered 2015-03-22: 40 mg via INTRAVENOUS
  Filled 2015-03-22: qty 4

## 2015-03-22 NOTE — Progress Notes (Signed)
3 Days Post-Op Procedure(s) (LRB): LAPAROSCOPIC CHOLECYSTECTOMY WITH INTRAOPERATIVE CHOLANGIOGRAM (N/A) Subjective: Short of breath  Objective: Vital signs in last 24 hours: Temp:  [97.6 F (36.4 C)-98.9 F (37.2 C)] 98.3 F (36.8 C) (12/22 0700) Pulse Rate:  [80-96] 84 (12/22 0344) Cardiac Rhythm:  [-] Normal sinus rhythm (12/22 0700) Resp:  [17-19] 17 (12/22 0344) BP: (112-141)/(55-82) 141/82 mmHg (12/22 0344) SpO2:  [95 %-97 %] 95 % (12/22 0344) Weight:  [75.7 kg (166 lb 14.2 oz)] 75.7 kg (166 lb 14.2 oz) (12/22 0344)  Hemodynamic parameters for last 24 hours:    Intake/Output from previous day: 12/21 0701 - 12/22 0700 In: 520 [P.O.:520] Out: 2725 [Urine:2725] Intake/Output this shift:   No distress Lungs: diminished breath sounds LLL  Lab Results:  Recent Labs  03/21/15 1430 03/22/15 0237  WBC 9.0 8.3  HGB 11.3* 12.3  HCT 34.4* 37.7  PLT 181 222   BMET:  Recent Labs  03/21/15 1430 03/22/15 0237  NA 135 134*  K 4.2 4.2  CL 99* 95*  CO2 28 29  GLUCOSE 160* 169*  BUN 10 20  CREATININE 0.70 0.78  CALCIUM 8.4* 9.0    PT/INR: No results for input(s): LABPROT, INR in the last 72 hours. ABG    Component Value Date/Time   TCO2 25 02/09/2014 1132   CBG (last 3)  No results for input(s): GLUCAP in the last 72 hours.  Assessment/Plan: S/P Procedure(s) (LRB): LAPAROSCOPIC CHOLECYSTECTOMY WITH INTRAOPERATIVE CHOLANGIOGRAM (N/A)  Follow up CXR has shown at least moderate left pleural effusion that seems to have increased some since admission as well as some interstitial edema bilat. I think she would benefit from left thoracentesis for therapeutic purposes and this will certainly improve her breathing. I would have as much fluid drained as possible and send it for cytology. If it recurs then a PleurX catheter could be inserted.   LOS: 6 days    [name] 03/22/2015

## 2015-03-22 NOTE — Progress Notes (Signed)
UR COMPLETED  

## 2015-03-22 NOTE — Progress Notes (Signed)
[facility] PROGRESS CONSULT NOTE    Progress Note   JUAN OLTHOFF OGD:[id number] DOB: [birth year] DOA: 03/16/2015 PCP: Bufford Spikes, DO   Brief Narrative:   [name] is an 81 y.o. female asked medical history mitral valve prolapse, GERD deafness left lung mass that has not been biopsied and was found on CT scan on May 2014, he repeated PET scan done on 01/06/2013 showed no change in size but it did have some patchy hypermetabolic uptake, with hilar nodes, she was felt at that time not to be a surgical candidate the patient refused biopsy as she did not want chemotherapy, essential hypertension comes into the hospital with right upper quadrant pain she is status post laparoscopic cholecystectomy, hospitalist were consulted for shortness of breath and wheezing.  Assessment/Plan:   Pleural effusion, left wheezing bilaterally: There is a concern that this may be related to malignancy but is likely due 20 fluid hydration during surgery. She has a 2-D echo that shows a grade 1 diastolic heart failure a chest x-ray was done on 03/16/2015 she received IV Lasix on 03/21/2015 a repeated chest x-ray is pending for 03/22/2015. She is about 5 L negative, her weight does not matches her negative balance. I cannot appreciate any JVD on physical exam she is mildly hyponatremic and hypochloremic and her CO2 was rising on her B-met. Her blood pressure is stable, her creatinine has not prized we'll give an additional dose of IV Lasix. She relates her breathing is improved. If there is no improvement on her chest x-ray will need to discuss with thoracic surgery with a therapeutic thoracocentesis will be a good option.  Symptomatic cholelithiasis: Status post surgical intervention she is tolerating her diet.  Hyperlipidemia:   Essential hypertension, benign Her blood pressure seems to be close to goal continue current management.   DVT Prophylaxis - Lovenox ordered.  Family  Communication: none Disposition Plan: Home when stable. Code Status:     Code Status Orders        Start     Ordered   03/19/15 1641  Do not attempt resuscitation (DNR)   Continuous    Question Answer Comment  In the event of cardiac or respiratory ARREST Do not call a "code blue"   In the event of cardiac or respiratory ARREST Do not perform Intubation, CPR, defibrillation or ACLS   In the event of cardiac or respiratory ARREST Use medication by any route, position, wound care, and other measures to relive pain and suffering. May use oxygen, suction and manual treatment of airway obstruction as needed for comfort.      03/19/15 1641    Advance Directive Documentation        Most Recent Value   Type of Advance Directive  Living will [dated 04/16/2005]   Pre-existing out of facility DNR order (yellow form or pink MOST form)     "MOST" Form in Place?          IV Access:    Peripheral IV   Procedures and diagnostic studies:   Dg Chest Port 1 View  16-Apr-2015  CLINICAL DATA:  Wheezing and cough since this morning, gallbladder surgery yesterday, history hypertension, hiatal hernia EXAM: PORTABLE CHEST 1 VIEW COMPARISON:  Portable exam 1756 hours compared 03/16/2015 FINDINGS: Upper normal heart size. Stable mediastinal contours and pulmonary vascularity. Moderate LEFT pleural effusion increased since previous exam. Increased LEFT basilar atelectasis. Persistent bronchitic changes. RIGHT lung clear. No pneumothorax. Bones demineralized with posttraumatic deformity of the proximal  RIGHT humerus. IMPRESSION: Increased LEFT pleural effusion and basilar atelectasis since previous exam. Electronically Signed   By: Lavonia Dana M.D.   On: 03/20/2015 18:09     Medical Consultants:    None.  Anti-Infectives:   Anti-infectives    Start     Dose/Rate Route Frequency Ordered Stop   03/19/15 0930  ceFAZolin (ANCEF) IVPB 2 g/50 mL premix     2 g 100 mL/hr over 30 Minutes Intravenous To  Surgery 03/19/15 0915 03/19/15 1300      Subjective:    Dennard Schaumann she relatesher shortness of breath is slightly improved.  Objective:    Filed Vitals:   03/21/15 2334 03/22/15 0209 03/22/15 0300 03/22/15 0344  BP: 135/66   141/82  Pulse: 88   84  Temp: 97.6 F (36.4 C)   97.9 F (36.6 C)  TempSrc: Oral   Oral  Resp: 19   17  Height:   '5\' 6"'$  (1.676 m)   Weight:   75.7 kg (166 lb 14.2 oz) 75.7 kg (166 lb 14.2 oz)  SpO2: 96% 97%  95%    Intake/Output Summary (Last 24 hours) at 03/22/15 0731 Last data filed at 03/22/15 0347  Gross per 24 hour  Intake    520 ml  Output   2600 ml  Net  -2080 ml   Filed Weights   03/19/15 2300 03/22/15 0300 03/22/15 0344  Weight: 75.751 kg (167 lb) 75.7 kg (166 lb 14.2 oz) 75.7 kg (166 lb 14.2 oz)    Exam: Gen:  NAD Cardiovascular:  RRR, No lower ext edem No JVD Chest and lungs: Good air movement and clear to auscultation. Abdomen:  Abdomen soft, NT/ND, + BS Extremities:  No edema   Data Reviewed:    Labs: Basic Metabolic Panel:  Recent Labs Lab 03/16/15 1120 03/20/15 0422 03/21/15 1430 03/22/15 0237  NA 131* 133* 135 134*  K 4.2 4.3 4.2 4.2  CL 97* 100* 99* 95*  CO2 '24 26 28 29  '$ GLUCOSE 117* 135* 160* 169*  BUN '19 11 10 20  '$ CREATININE 0.75 0.66 0.70 0.78  CALCIUM 9.3 8.8* 8.4* 9.0   GFR Estimated Creatinine Clearance: 41.8 mL/min (by C-G formula based on Cr of 0.78). Liver Function Tests:  Recent Labs Lab 03/16/15 1120 03/20/15 0422  AST 18 43*  ALT 16 35  ALKPHOS 63 58  BILITOT 0.8 0.6  PROT 6.3* 5.3*  ALBUMIN 3.8 2.8*    Recent Labs Lab 03/16/15 1120  LIPASE 38   No results for input(s): AMMONIA in the last 168 hours. Coagulation profile  Recent Labs Lab 03/16/15 1120  INR 1.09    CBC:  Recent Labs Lab 03/16/15 1120 03/20/15 0422 03/21/15 1430 03/22/15 0237  WBC 8.4 10.3 9.0 8.3  NEUTROABS 5.7  --   --   --   HGB 13.5 12.1 11.3* 12.3  HCT 41.0 36.6 34.4* 37.7  MCV 93.8  95.3 95.0 94.7  PLT 275 201 181 222   Cardiac Enzymes: No results for input(s): CKTOTAL, CKMB, CKMBINDEX, TROPONINI in the last 168 hours. BNP (last 3 results) No results for input(s): PROBNP in the last 8760 hours. CBG: No results for input(s): GLUCAP in the last 168 hours. D-Dimer: No results for input(s): DDIMER in the last 72 hours. Hgb A1c: No results for input(s): HGBA1C in the last 72 hours. Lipid Profile: No results for input(s): CHOL, HDL, LDLCALC, TRIG, CHOLHDL, LDLDIRECT in the last 72 hours. Thyroid function studies: No results for  input(s): TSH, T4TOTAL, T3FREE, THYROIDAB in the last 72 hours.  Invalid input(s): FREET3 Anemia work up: No results for input(s): VITAMINB12, FOLATE, FERRITIN, TIBC, IRON, RETICCTPCT in the last 72 hours. Sepsis Labs:  Recent Labs Lab 03/16/15 1120 03/20/15 0422 03/21/15 1430 03/22/15 0237  WBC 8.4 10.3 9.0 8.3   Microbiology Recent Results (from the past 240 hour(s))  Surgical PCR screen     Status: None   Collection Time: 03/18/15  8:23 PM  Result Value Ref Range Status   MRSA, PCR NEGATIVE NEGATIVE Final   Staphylococcus aureus NEGATIVE NEGATIVE Final    Comment:        The Xpert SA Assay (FDA approved for NASAL specimens in patients over 65 years of age), is one component of a comprehensive surveillance program.  Test performance has been validated by [facility] for patients greater than or equal to 18 year old. It is not intended to diagnose infection nor to guide or monitor treatment.      Medications:   . antiseptic oral rinse  7 mL Mouth Rinse q12n4p  . chlorhexidine  15 mL Mouth Rinse BID  . docusate sodium  100 mg Oral BID  . feeding supplement  1 Container Oral TID BM  . furosemide  20 mg Oral Daily  . heparin subcutaneous  5,000 Units Subcutaneous 3 times per day  . levalbuterol  0.63 mg Nebulization Q6H  . losartan  50 mg Oral Daily  . methylPREDNISolone (SOLU-MEDROL) injection  40 mg Intravenous  Q12H  . metoprolol succinate  100 mg Oral Daily  . pantoprazole  40 mg Oral Daily  . polyethylene glycol  17 g Oral Daily  . potassium chloride SA  40 mEq Oral Daily  . pregabalin  75 mg Oral TID  . sertraline  100 mg Oral QHS   Continuous Infusions:   Time spent: 25 min   LOS: 6 days   Charlynne Cousins  [facility] Pager [phone]  *Please refer to [email or web address], password [email or web address] to get updated schedule on who will round on this patient, as hospitalists switch teams weekly. If 7PM-7AM, please contact night-coverage at [email or web address], password [email or web address] for any overnight needs.  03/22/2015, 7:31 AM

## 2015-03-22 NOTE — Progress Notes (Addendum)
Physical Therapy Treatment Patient Details Name: [name] MRN: [id number] DOB: Feb 22, 1916 Today's Date: 03/22/2015    History of Present Illness 81 y/o female with PMH of HTN, Anxiety, Deafness, Lung mass, recent recurrent h/o abdominal pains is admitted for cholecystectomy by surgery.  Now s/p laparoscopic cholecystectomy.    PT Comments    Pt requires max encouragement to participate in therapy.  SpO2 down to 82% on 4 LPM ambulating in hallway.  Pt's son in room and very supportive and confirms he will be available to provide 24/7 assist to pt at d/c.  Addendum 03/23/15: pt's family now reporting they are unable to provide the 24/7 assist that pt will need at d/c.  Plan has been updated to SNF to meet pt's needs and to achieve pt's rehab goals.    Follow Up Recommendations  SNF; 24/7 Assist/supervision; Pt's family now expresses inability to provide 24/7 assist at home.     Equipment Recommendations  None recommended by PT    Recommendations for Other Services       Precautions / Restrictions Precautions Precautions: Fall Precaution Comments: pt is deaf; has white board for communication Restrictions Weight Bearing Restrictions: No    Mobility  Bed Mobility Overal bed mobility: Needs Assistance Bed Mobility: Supine to Sit     Supine to sit: Min assist     General bed mobility comments: Min assist for initiating w/ log roll. Tactile and verbal cues provided and pt requires increased time.  Transfers Overall transfer level: Needs assistance Equipment used: Rolling walker (2 wheeled) Transfers: Sit to/from Stand Sit to Stand: Min guard         General transfer comment: Close min guard for pt's safety, min instability noted.  Cues to scoot forward on bed/BSC prior to standing.  Ambulation/Gait Ambulation/Gait assistance: Min guard Ambulation Distance (Feet): 80 Feet Assistive device: Rolling walker (2 wheeled) Gait Pattern/deviations: Step-through  pattern;Decreased stride length   Gait velocity interpretation: Below normal speed for age/gender General Gait Details: Min instability although no overt LOB.  Close min guard for pt's safety.  SpO2 down to 82% on 4 LPM, back up to 90% once 6 LPM O2 and cues for pursed lip breathing.   Stairs            Wheelchair Mobility    Modified Rankin (Stroke Patients Only)       Balance Overall balance assessment: Needs assistance Sitting-balance support: Feet supported Sitting balance-Leahy Scale: Good     Standing balance support: Bilateral upper extremity supported;During functional activity Standing balance-Leahy Scale: Poor Standing balance comment: Relies on RW                     Cognition Arousal/Alertness: Awake/alert Behavior During Therapy: WFL for tasks assessed/performed Overall Cognitive Status: Within Functional Limits for tasks assessed                      Exercises      General Comments General comments (skin integrity, edema, etc.): Son present during session and reports he is available at d/c to provide 24/7 assist at home.      Pertinent Vitals/Pain Pain Assessment: Faces Faces Pain Scale: Hurts little more Pain Location: abdomen Pain Descriptors / Indicators: Aching;Grimacing Pain Intervention(s): Limited activity within patient's tolerance;Monitored during session;Repositioned    Home Living                      Prior Function  PT Goals (current goals can now be found in the care plan section) Acute Rehab PT Goals Patient Stated Goal: none stated PT Goal Formulation: With patient Time For Goal Achievement: 03/30/15 Potential to Achieve Goals: Good Progress towards PT goals: Progressing toward goals    Frequency  Min 3X/week    PT Plan Discharge plan needs to be updated    Co-evaluation             End of Session Equipment Utilized During Treatment: Gait belt;Oxygen Activity Tolerance:  Patient limited by fatigue;Other (comment) (limited by SpO2) Patient left: with call bell/phone within reach;with family/visitor present;in chair (sitting EOB)     Time: 1601-0932 PT Time Calculation (min) (ACUTE ONLY): 35 min  Charges:  $Gait Training: 23-37 mins $Therapeutic Activity: 8-22 mins                    G CodesJoslyn Hy PT, [place] [phone] Pager: [phone] 03/22/2015, 12:16 PM

## 2015-03-22 NOTE — Progress Notes (Signed)
3 Days Post-Op  Subjective: Ate breakfast, felt weak when getting up with assist  Objective: Vital signs in last 24 hours: Temp:  [97.6 F (36.4 C)-98.9 F (37.2 C)] 98.3 F (36.8 C) (12/22 0700) Pulse Rate:  [80-96] 84 (12/22 0344) Resp:  [17-19] 17 (12/22 0344) BP: (112-141)/(55-82) 141/82 mmHg (12/22 0344) SpO2:  [95 %-97 %] 95 % (12/22 0344) Weight:  [75.7 kg (166 lb 14.2 oz)] 75.7 kg (166 lb 14.2 oz) (12/22 0344) Last BM Date:  (prior to surgery)  Intake/Output from previous day: 12/21 0701 - 12/22 0700 In: 520 [P.O.:520] Out: 2725 [Urine:2725] Intake/Output this shift:    General appearance: alert and cooperative GI: soft, NT, incisions CDI  Lab Results:   Recent Labs  03/21/15 1430 03/22/15 0237  WBC 9.0 8.3  HGB 11.3* 12.3  HCT 34.4* 37.7  PLT 181 222   BMET  Recent Labs  03/21/15 1430 03/22/15 0237  NA 135 134*  K 4.2 4.2  CL 99* 95*  CO2 28 29  GLUCOSE 160* 169*  BUN 10 20  CREATININE 0.70 0.78  CALCIUM 8.4* 9.0    Anti-infectives: Anti-infectives    Start     Dose/Rate Route Frequency Ordered Stop   03/19/15 0930  ceFAZolin (ANCEF) IVPB 2 g/50 mL premix     2 g 100 mL/hr over 30 Minutes Intravenous To Surgery 03/19/15 0915 03/19/15 1300      Assessment/Plan: S/P lap chole, IOC 12/19 - doing well from this standoint Deconditioning - PT Pleural effusion - per primary service  LOS: 6 days    Mc Hollen E 03/22/2015

## 2015-03-23 ENCOUNTER — Inpatient Hospital Stay (HOSPITAL_COMMUNITY): Payer: Medicare Other

## 2015-03-23 MED ORDER — POLYETHYLENE GLYCOL 3350 17 G PO PACK
17.0000 g | PACK | Freq: Two times a day (BID) | ORAL | Status: DC
  Administered 2015-03-23 – 2015-03-27 (×8): 17 g via ORAL
  Filled 2015-03-23 (×9): qty 1

## 2015-03-23 MED ORDER — LIDOCAINE HCL (PF) 1 % IJ SOLN
INTRAMUSCULAR | Status: AC
  Filled 2015-03-23: qty 10

## 2015-03-23 NOTE — Evaluation (Signed)
Occupational Therapy Evaluation Patient Details Name: [name] MRN: [id number] DOB: October 28, 1915 Today's Date: 03/23/2015    History of Present Illness 81 y/o female with PMH of HTN, Anxiety, Deafness, Lung mass, recent recurrent h/o abdominal pains is admitted for cholecystectomy by surgery.  Now s/p laparoscopic cholecystectomy.   Clinical Impression   PT admitted with cholecystectomy. Pt currently with functional limitiations due to the deficits listed below (see OT problem list). PTA living at [facility] with (A) from children. Pt will benefit from skilled OT to increase their independence and safety with adls and balance to allow discharge SNF ( requesting [facility]).     Follow Up Recommendations  SNF    Equipment Recommendations  Other (comment) (defer)    Recommendations for Other Services Other (comment) (SW for SNF to [facility])     Precautions / Restrictions Precautions Precautions: Fall Precaution Comments: pt is deaf; has white board for communication Restrictions Weight Bearing Restrictions: No      Mobility Bed Mobility Overal bed mobility: Needs Assistance Bed Mobility: Supine to Sit     Supine to sit: Min assist;HOB elevated     General bed mobility comments: states "i have to have help" pt reaching for therapist  Transfers Overall transfer level: Needs assistance Equipment used: Rolling walker (2 wheeled) Transfers: Sit to/from Stand Sit to Stand: Min assist              Balance Overall balance assessment: Needs assistance         Standing balance support: Bilateral upper extremity supported;During functional activity Standing balance-Leahy Scale: Poor                              ADL Overall ADL's : Needs assistance/impaired Eating/Feeding: Set up   Grooming: Set up   Upper Body Bathing: Moderate assistance   Lower Body Bathing: Maximal assistance;Sit to/from stand Lower Body Bathing Details (indicate cue type  and reason): don stockings and shoes         Toilet Transfer: Minimal assistance;Ambulation           Functional mobility during ADLs: Minimal assistance;Rolling walker General ADL Comments: pt positioned in chair and requries white board for all communication during session. pt able to read board clearly and quickly     Vision     Perception     Praxis      Pertinent Vitals/Pain Pain Assessment: Faces Faces Pain Scale: Hurts even more Pain Location: back Pain Descriptors / Indicators: Aching Pain Intervention(s): Repositioned (reports less pain once OOB)     Hand Dominance Right   Extremity/Trunk Assessment Upper Extremity Assessment Upper Extremity Assessment: Generalized weakness   Lower Extremity Assessment Lower Extremity Assessment: Defer to PT evaluation   Cervical / Trunk Assessment Cervical / Trunk Assessment: Kyphotic   Communication Communication Communication: HOH;Deaf   Cognition Arousal/Alertness: Awake/alert Behavior During Therapy: WFL for tasks assessed/performed Overall Cognitive Status: Within Functional Limits for tasks assessed                     General Comments       Exercises       Shoulder Instructions      Home Living Family/patient expects to be discharged to:: Skilled nursing facility (requesting [facility]) Living Arrangements: Children Available Help at Discharge: Family;Available 24 hours/day  Prior Functioning/Environment Level of Independence: Needs assistance    ADL's / Homemaking Assistance Needed: sponge bath mainly sometimes sits on seat in shower        OT Diagnosis: Generalized weakness;Acute pain   OT Problem List: Decreased strength;Decreased activity tolerance;Impaired balance (sitting and/or standing);Decreased knowledge of use of DME or AE;Decreased knowledge of precautions;Obesity;Pain   OT Treatment/Interventions: Self-care/ADL  training;Therapeutic exercise;DME and/or AE instruction;Therapeutic activities;Patient/family education;Balance training    OT Goals(Current goals can be found in the care plan section) Acute Rehab OT Goals Patient Stated Goal: to get rehab before going home OT Goal Formulation: With patient Time For Goal Achievement: 04/06/15 Potential to Achieve Goals: Good  OT Frequency: Min 2X/week   Barriers to D/C:            Co-evaluation              End of Session Equipment Utilized During Treatment: Gait belt;Rolling walker;Oxygen Nurse Communication: Mobility status;Precautions  Activity Tolerance: Patient tolerated treatment well Patient left: in chair;with call bell/phone within reach;Other (comment) (Rn notified to (A) in 30 minutes back to bed)   Time: 0352-4818 OT Time Calculation (min): 28 min Charges:  OT General Charges $OT Visit: 1 Procedure OT Evaluation $Initial OT Evaluation Tier I: 1 Procedure OT Treatments $Self Care/Home Management : 8-22 mins G-Codes:    Parke Poisson B 2015-03-28, 2:41 PM  Jeri Modena   OTR/L Pager: [phone] Office: [phone] .

## 2015-03-23 NOTE — Progress Notes (Signed)
[facility] PROGRESS CONSULT NOTE    Progress Note   [name]:[id number] DOB: 07-18-1915 DOA: 03/16/2015 PCP: Hollace Kinnier, DO   Brief Narrative:   [name] is an 81 y.o. female asked medical history mitral valve prolapse, GERD deafness left lung mass that has not been biopsied and was found on CT scan on May 2014, he repeated PET scan done on 01/06/2013 showed no change in size but it did have some patchy hypermetabolic uptake, with hilar nodes, she was felt at that time not to be a surgical candidate the patient refused biopsy as she did not want chemotherapy, essential hypertension comes into the hospital with right upper quadrant pain she is status post laparoscopic cholecystectomy, hospitalist were consulted for shortness of breath and wheezing.  Assessment/Plan:   Pleural effusion, left wheezing bilaterally: There is a concern that this may be related to malignancy but is likely due 20 fluid hydration during surgery. She has a 2-D echo that shows a grade 1 diastolic heart failure a chest x-ray was done on 03/16/2015 she received IV Lasix on 03/21/2015 a repeated chest x-ray on 03/22/2015 showed worsening effusion. I cannot appreciate any JVD on physical exam She relates her breathing is improved. For therapeutic thoracocentesis today cell count and cytology. Cont current dose of lasix.  Will sign off, call with questions.  Symptomatic cholelithiasis: Status post surgical intervention she is tolerating her diet.  Hyperlipidemia:   Essential hypertension, benign Her blood pressure seems to be close to goal continue current management.   DVT Prophylaxis - Lovenox ordered.  Family Communication: none Disposition Plan: by primary care team Code Status:     Code Status Orders        Start     Ordered   03/19/15 1641  Do not attempt resuscitation (DNR)   Continuous    Question Answer Comment  In the event of cardiac or respiratory ARREST Do  not call a "code blue"   In the event of cardiac or respiratory ARREST Do not perform Intubation, CPR, defibrillation or ACLS   In the event of cardiac or respiratory ARREST Use medication by any route, position, wound care, and other measures to relive pain and suffering. May use oxygen, suction and manual treatment of airway obstruction as needed for comfort.      03/19/15 1641    Advance Directive Documentation        Most Recent Value   Type of Advance Directive  Living will [dated 04/16/2005]   Pre-existing out of facility DNR order (yellow form or pink MOST form)     "MOST" Form in Place?          IV Access:    Peripheral IV   Procedures and diagnostic studies:   Dg Chest 2 View  03/22/2015  CLINICAL DATA:  Shortness of Breath EXAM: CHEST  2 VIEW COMPARISON:  March 20, 2015 FINDINGS: There is a persistent sizable left pleural effusion with left lower lobe atelectasis. There is perihilar interstitial edema bilaterally. There is cardiomegaly with pulmonary venous hypertension. No apparent adenopathy. IMPRESSION: Evidence of congestive heart failure. Persistent sizable left effusion. Electronically Signed   By: Lowella Grip III M.D.   On: 03/22/2015 07:51     Medical Consultants:    None.  Anti-Infectives:   Anti-infectives    Start     Dose/Rate Route Frequency Ordered Stop   03/19/15 0930  ceFAZolin (ANCEF) IVPB 2 g/50 mL premix     2 g 100  mL/hr over 30 Minutes Intravenous To Surgery 03/19/15 0915 03/19/15 1300      Subjective:    [name] No SOB.  Objective:    Filed Vitals:   03/23/15 0200 03/23/15 0416 03/23/15 0421 03/23/15 0500  BP:  158/88    Pulse: 81   83  Temp:   97.9 F (36.6 C)   TempSrc:   Oral   Resp: '16 22  22  '$ Height:      Weight:   76 kg (167 lb 8.8 oz)   SpO2: 96%   95%    Intake/Output Summary (Last 24 hours) at 03/23/15 0742 Last data filed at 03/23/15 0421  Gross per 24 hour  Intake    240 ml  Output    1500 ml  Net  -1260 ml   Filed Weights   03/22/15 0300 03/22/15 0344 03/23/15 0421  Weight: 75.7 kg (166 lb 14.2 oz) 75.7 kg (166 lb 14.2 oz) 76 kg (167 lb 8.8 oz)    Exam: Gen:  NAD Cardiovascular:  RRR, No lower ext edem No JVD Chest and lungs: Good air movement and clear to auscultation. Abdomen:  Abdomen soft, NT/ND, + BS Extremities:  No edema   Data Reviewed:    Labs: Basic Metabolic Panel:  Recent Labs Lab 03/16/15 1120 03/20/15 0422 03/21/15 1430 03/22/15 0237  NA 131* 133* 135 134*  K 4.2 4.3 4.2 4.2  CL 97* 100* 99* 95*  CO2 '24 26 28 29  '$ GLUCOSE 117* 135* 160* 169*  BUN '19 11 10 20  '$ CREATININE 0.75 0.66 0.70 0.78  CALCIUM 9.3 8.8* 8.4* 9.0   GFR Estimated Creatinine Clearance: 41.9 mL/min (by C-G formula based on Cr of 0.78). Liver Function Tests:  Recent Labs Lab 03/16/15 1120 03/20/15 0422  AST 18 43*  ALT 16 35  ALKPHOS 63 58  BILITOT 0.8 0.6  PROT 6.3* 5.3*  ALBUMIN 3.8 2.8*    Recent Labs Lab 03/16/15 1120  LIPASE 38   No results for input(s): AMMONIA in the last 168 hours. Coagulation profile  Recent Labs Lab 03/16/15 1120  INR 1.09    CBC:  Recent Labs Lab 03/16/15 1120 03/20/15 0422 03/21/15 1430 03/22/15 0237  WBC 8.4 10.3 9.0 8.3  NEUTROABS 5.7  --   --   --   HGB 13.5 12.1 11.3* 12.3  HCT 41.0 36.6 34.4* 37.7  MCV 93.8 95.3 95.0 94.7  PLT 275 201 181 222   Cardiac Enzymes: No results for input(s): CKTOTAL, CKMB, CKMBINDEX, TROPONINI in the last 168 hours. BNP (last 3 results) No results for input(s): PROBNP in the last 8760 hours. CBG: No results for input(s): GLUCAP in the last 168 hours. D-Dimer: No results for input(s): DDIMER in the last 72 hours. Hgb A1c: No results for input(s): HGBA1C in the last 72 hours. Lipid Profile: No results for input(s): CHOL, HDL, LDLCALC, TRIG, CHOLHDL, LDLDIRECT in the last 72 hours. Thyroid function studies: No results for input(s): TSH, T4TOTAL, T3FREE, THYROIDAB in  the last 72 hours.  Invalid input(s): FREET3 Anemia work up: No results for input(s): VITAMINB12, FOLATE, FERRITIN, TIBC, IRON, RETICCTPCT in the last 72 hours. Sepsis Labs:  Recent Labs Lab 03/16/15 1120 03/20/15 0422 03/21/15 1430 03/22/15 0237  WBC 8.4 10.3 9.0 8.3   Microbiology Recent Results (from the past 240 hour(s))  Surgical PCR screen     Status: None   Collection Time: 03/18/15  8:23 PM  Result Value Ref Range Status   MRSA,  PCR NEGATIVE NEGATIVE Final   Staphylococcus aureus NEGATIVE NEGATIVE Final    Comment:        The Xpert SA Assay (FDA approved for NASAL specimens in patients over 38 years of age), is one component of a comprehensive surveillance program.  Test performance has been validated by [facility] for patients greater than or equal to 73 year old. It is not intended to diagnose infection nor to guide or monitor treatment.      Medications:   . antiseptic oral rinse  7 mL Mouth Rinse q12n4p  . chlorhexidine  15 mL Mouth Rinse BID  . docusate sodium  100 mg Oral BID  . feeding supplement  1 Container Oral TID BM  . [START ON 03/24/2015] furosemide  20 mg Oral Daily  . heparin subcutaneous  5,000 Units Subcutaneous 3 times per day  . losartan  50 mg Oral Daily  . methylPREDNISolone (SOLU-MEDROL) injection  40 mg Intravenous Q12H  . metoprolol succinate  100 mg Oral Daily  . pantoprazole  40 mg Oral Daily  . polyethylene glycol  17 g Oral Daily  . potassium chloride SA  40 mEq Oral Daily  . pregabalin  75 mg Oral TID  . sertraline  100 mg Oral QHS   Continuous Infusions:   Time spent: 25 min   LOS: 7 days   Charlynne Cousins  [facility] Pager [phone]  *Please refer to [email or web address], password [email or web address] to get updated schedule on who will round on this patient, as hospitalists switch teams weekly. If 7PM-7AM, please contact night-coverage at [email or web address], password [email or web address] for any overnight needs.  03/23/2015, 7:42 AM

## 2015-03-23 NOTE — Care Management Important Message (Signed)
Important Message  Patient Details  Name: [name] MRN: [id number] Date of Birth: [birth year]   Medicare Important Message Given:  Yes    Chrisma Hurlock Abena 03/23/2015, 3:20 PM

## 2015-03-23 NOTE — Progress Notes (Signed)
Patient ID: [name], female   DOB: 1915/09/11, 81 y.o.   MRN: [id number] 4 Days Post-Op  Subjective: Just back from L thoracentesis. Breathing better.  Objective: Vital signs in last 24 hours: Temp:  [97.5 F (36.4 C)-98.5 F (36.9 C)] 98.4 F (36.9 C) (12/23 0700) Pulse Rate:  [80-114] 83 (12/23 0500) Resp:  [16-27] 22 (12/23 0500) BP: (144-189)/(80-163) 189/163 mmHg (12/23 0935) SpO2:  [87 %-96 %] 95 % (12/23 0500) Weight:  [76 kg (167 lb 8.8 oz)] 76 kg (167 lb 8.8 oz) (12/23 0421) Last BM Date:  (prior to surgery)  Intake/Output from previous day: 12/22 0701 - 12/23 0700 In: 240 [P.O.:240] Out: 1650 [Urine:1650] Intake/Output this shift:    General appearance: cooperative Resp: clear to auscultation bilaterally and better air movement L lower Cardio: regular rate and rhythm GI: soft, NT, ND  Lab Results: CBC   Recent Labs  03/21/15 1430 03/22/15 0237  WBC 9.0 8.3  HGB 11.3* 12.3  HCT 34.4* 37.7  PLT 181 222   BMET  Recent Labs  03/21/15 1430 03/22/15 0237  NA 135 134*  K 4.2 4.2  CL 99* 95*  CO2 28 29  GLUCOSE 160* 169*  BUN 10 20  CREATININE 0.70 0.78  CALCIUM 8.4* 9.0   PT/INR No results for input(s): LABPROT, INR in the last 72 hours. ABG No results for input(s): PHART, HCO3 in the last 72 hours.  Invalid input(s): PCO2, PO2  Studies/Results: Dg Chest 1 View  03/23/2015  CLINICAL DATA:  Left pleural effusion.  Post left thoracentesis EXAM: CHEST 1 VIEW COMPARISON:  03/22/2015 FINDINGS: Decreasing left pleural effusion. Probable small left pleural effusion remains. No pneumothorax following thoracentesis. Linear areas of scarring or atelectasis in the lungs bilaterally. Heart is borderline in size. IMPRESSION: Significant decrease in left pleural effusion following thoracentesis. No pneumothorax. Electronically Signed   By: Rolm Baptise M.D.   On: 03/23/2015 09:59   Dg Chest 2 View  03/22/2015  CLINICAL DATA:  Shortness of Breath  EXAM: CHEST  2 VIEW COMPARISON:  March 20, 2015 FINDINGS: There is a persistent sizable left pleural effusion with left lower lobe atelectasis. There is perihilar interstitial edema bilaterally. There is cardiomegaly with pulmonary venous hypertension. No apparent adenopathy. IMPRESSION: Evidence of congestive heart failure. Persistent sizable left effusion. Electronically Signed   By: Lowella Grip III M.D.   On: 03/22/2015 07:51   US Thoracentesis Asp Pleural Space W/img Guide  03/23/2015  CLINICAL DATA:  CHF, left pleural effusion, shortness of breath EXAM: ULTRASOUND GUIDED LEFT THORACENTESIS COMPARISON:  03/23/2015 PROCEDURE: An ultrasound guided thoracentesis was thoroughly discussed with the patient and questions answered. The benefits, risks, alternatives and complications were also discussed. The patient understands and wishes to proceed with the procedure. Written consent was obtained. Ultrasound was performed to localize and mark an adequate pocket of fluid in the left chest. The area was then prepped and draped in the normal sterile fashion. 1% Lidocaine was used for local anesthesia. Under ultrasound guidance a safety centesis needle catheter was introduced. Thoracentesis was performed. The catheter was removed and a dressing applied. Complications:  None immediate FINDINGS: A total of approximately 1 L of serosanguineous amber pleural fluid was removed. A fluid sample wassent for laboratory analysis. IMPRESSION: Successful ultrasound guided left thoracentesis yielding 1 L of pleural fluid. Electronically Signed   By: Jerilynn Mages.  Shick M.D.   On: 03/23/2015 09:46    Anti-infectives: Anti-infectives    Start     Dose/Rate  Route Frequency Ordered Stop   03/19/15 0930  ceFAZolin (ANCEF) IVPB 2 g/50 mL premix     2 g 100 mL/hr over 30 Minutes Intravenous To Surgery 03/19/15 0915 03/19/15 1300      Assessment/Plan: S/P lap chole, IOC 12/19 - doing well from this standoint Deconditioning -  PT/OT, PT eval rec HH/24h sup. She wants to go to SNF to do rehab and clinically that seems like a good choice. OT eval is pending. CSW to see. Pleural effusion - appreciate TCTS F/U. S/P L thoracentesis this AM - 1L removed  Resp failure - wean O2 as able VTE - SQ hep DIspo - consider floor tomorrow if O2 req lower. Possible SNF as above I also spoke with her son.  LOS: 7 days    Georganna Skeans, MD, MPH, FACS Trauma: [phone] General Surgery: [phone]  03/23/2015

## 2015-03-23 NOTE — Procedures (Signed)
Successful left thoracentesis No comp 1 liter removed Full report in PACS CXR PENDING

## 2015-03-24 NOTE — NC FL2 (Signed)
[place] MEDICAID FL2 LEVEL OF CARE SCREENING TOOL     IDENTIFICATION  Patient Name: [name] Birthdate: 20-Oct-1915 Sex: female Admission Date (Current Location): 03/16/2015  [facility] and [place] Number:  Herbalist and Address:  [facility], [place] [address] [place], [place] [address]      Provider Number: [id number]  Attending Physician Name and Address:  Nolon Nations, MD  Relative Name and Phone Number:  [name], daughter, [phone]    Current Level of Care: Hospital Recommended Level of Care: [place] Prior Approval Number:    Date Approved/Denied:   PASRR Number: [id number]  Discharge Plan: SNF    Current Diagnoses: Patient Active Problem List   Diagnosis Date Noted  . Gallbladder disease 03/19/2015  . Symptomatic cholelithiasis 03/16/2015  . Pleural effusion, left 03/16/2015  . Atrophic vaginitis 12/29/2014  . Pain in lower limb 06/01/2013  . Ingrown nail 06/01/2013  . Hemorrhoids 03/16/2013  . Hereditary and idiopathic peripheral neuropathy 03/16/2013  . Solitary pulmonary nodule 12/14/2012  . Edema 10/28/2012  . Urinary hesitancy 10/28/2012  . Anemia, iron deficiency 09/06/2012  . Onychomycosis 08/04/2012  . Neck pain 06/24/2012  . Vulvar burning 05/29/2012  . Tricompartment degenerative joint disease of knee 12/10/2011  . Tinnitus 10/20/2011  . Hearing loss 08/11/2011  . Dizziness 03/05/2011  . KNEE PAIN 02/14/2010  . Hyperlipidemia 01/13/2007  . Essential hypertension, benign 01/13/2007  . HAY FEVER 01/13/2007  . GERD 01/13/2007  . ARTHRITIS 01/13/2007  . PLANTAR FASCIITIS 01/13/2007  . Personal history of unspecified circulatory disease 01/13/2007  . HIATAL HERNIA, HX OF 01/13/2007    Orientation RESPIRATION BLADDER Height & Weight    Self, Time, Situation, Place  Normal Continent, Indwelling catheter (Urinary Catheter)   158 lbs.  BEHAVIORAL SYMPTOMS/MOOD NEUROLOGICAL BOWEL  NUTRITION STATUS   (N/A)   Continent  (Mechanical Soft. Please see DC summary for updates.)  AMBULATORY STATUS COMMUNICATION OF NEEDS Skin   Extensive Assist Verbally Surgical wounds (Closed abdomen incision)                       Personal Care Assistance Level of Assistance  Bathing, Feeding, Dressing Bathing Assistance: Maximum assistance Feeding assistance: Independent Dressing Assistance: Maximum assistance     Functional Limitations Info  Hearing   Hearing Info: Impaired      SPECIAL CARE FACTORS FREQUENCY  PT (By licensed PT)     PT Frequency: 5x/week              Contractures      Additional Factors Info  Code Status, Allergies Code Status Info: DNR Allergies Info: Statins, Aleve           Current Medications (03/24/2015):  This is the current hospital active medication list Current Facility-Administered Medications  Medication Dose Route Frequency Provider Last Rate Last Dose  . acetaminophen (TYLENOL) tablet 325-650 mg  325-650 mg Oral Q4H PRN Earnstine Regal, PA-C   650 mg at 03/24/15 0915  . antiseptic oral rinse (CPC / CETYLPYRIDINIUM CHLORIDE 0.05%) solution 7 mL  7 mL Mouth Rinse q12n4p Donnie Mesa, MD   7 mL at 03/23/15 1332  . chlorhexidine (PERIDEX) 0.12 % solution 15 mL  15 mL Mouth Rinse BID Donnie Mesa, MD   15 mL at 03/24/15 5916  . docusate sodium (COLACE) capsule 100 mg  100 mg Oral BID Melton Alar, PA-C   100 mg at 03/24/15 0915  . feeding supplement (BOOST /  RESOURCE BREEZE) liquid 1 Container  1 Container Oral TID BM [name], MD   1 Container at 03/23/15 2242  . furosemide (LASIX) tablet 20 mg  20 mg Oral Daily Charlynne Cousins, MD   20 mg at 03/24/15 0915  . heparin injection 5,000 Units  5,000 Units Subcutaneous 3 times per day Earnstine Regal, PA-C   5,000 Units at 03/24/15 [phone]  . HYDROmorphone (DILAUDID) injection 0.5 mg  0.5 mg Intravenous Q3H PRN Earnstine Regal, PA-C   0.5 mg at 03/24/15 0048  .  levalbuterol (XOPENEX) nebulizer solution 0.63 mg  0.63 mg Nebulization Q6H PRN Charlynne Cousins, MD   0.63 mg at 03/22/15 1428  . losartan (COZAAR) tablet 50 mg  50 mg Oral Daily Donnie Mesa, MD   50 mg at 03/24/15 0915  . methylPREDNISolone sodium succinate (SOLU-MEDROL) 40 mg/mL injection 40 mg  40 mg Intravenous Q12H Melton Alar, PA-C   40 mg at 03/24/15 0550  . metoprolol succinate (TOPROL-XL) 24 hr tablet 100 mg  100 mg Oral Daily Earnstine Regal, PA-C   100 mg at 03/24/15 0915  . ondansetron (ZOFRAN) injection 4 mg  4 mg Intravenous Q6H PRN Coralie Keens, MD   4 mg at 03/23/15 0534  . oxyCODONE-acetaminophen (PERCOCET/ROXICET) 5-325 MG per tablet 1 tablet  1 tablet Oral Q4H PRN Melton Alar, PA-C   1 tablet at 03/23/15 1406  . pantoprazole (PROTONIX) EC tablet 40 mg  40 mg Oral Daily Earnstine Regal, PA-C   40 mg at 03/24/15 0915  . polyethylene glycol (MIRALAX / GLYCOLAX) packet 17 g  17 g Oral BID Charlynne Cousins, MD   17 g at 03/24/15 [phone]  . potassium chloride SA (K-DUR,KLOR-CON) CR tablet 40 mEq  40 mEq Oral Daily Nat Christen, PA-C   40 mEq at 03/24/15 0915  . pregabalin (LYRICA) capsule 75 mg  75 mg Oral TID Earnstine Regal, PA-C   75 mg at 03/24/15 0915  . sertraline (ZOLOFT) tablet 100 mg  100 mg Oral QHS Earnstine Regal, PA-C   100 mg at 03/23/15 2241     Discharge Medications: Please see discharge summary for a list of discharge medications.  Relevant Imaging Results:  Relevant Lab Results:   Additional Information SSN: [id number]  Benard Halsted, LCSWA

## 2015-03-24 NOTE — Progress Notes (Signed)
Patient ID: [name], female   DOB: 18-Dec-1915, 81 y.o.   MRN: [id number]     [facility]      [address] [place], [place] [address]    Phone: [phone] FAX: [phone]     Subjective:  Passing flatus, no BM.  VSS.  Afebrile.  On room air.   Objective:  Vital signs:  Filed Vitals:   03/23/15 1622 03/23/15 1932 03/23/15 2356 03/24/15 0413  BP:  158/81 148/82 140/82  Pulse:  92 83 78  Temp: 97.3 F (36.3 C) 97.9 F (36.6 C) 98.3 F (36.8 C) 97.8 F (36.6 C)  TempSrc: Oral Oral Oral Oral  Resp:  '22 19 14  '$ Height:      Weight:    71.9 kg (158 lb 8.2 oz)  SpO2:  94% 93% 94%    Last BM Date:  (prior to surgery)  Intake/Output   Yesterday:  12/23 0701 - 12/24 0700 In: 1090 [P.O.:840] Out: 900 [Urine:900] This shift:    I/O last 3 completed shifts: In: 1090 [P.O.:840; Other:250] Out: 1450 [Urine:1450]    Physical Exam: General: Pt awake/alert/oriented x4 in no acute distress Chest: cta.  No chest wall pain w good excursion CV:  Pulses intact.  Regular rhythm MS: Normal AROM mjr joints.  No obvious deformity Abdomen: Soft.  Nondistended.  Incisions are c/d/i.  Mildly tender at incisions only.  No evidence of peritonitis.  No incarcerated hernias. Ext: No mjr edema.  No cyanosis Skin: No petechiae / purpura   Problem List:   Principal Problem:   Symptomatic cholelithiasis Active Problems:   Hyperlipidemia   Essential hypertension, benign   GERD   Tricompartment degenerative joint disease of knee   Hereditary and idiopathic peripheral neuropathy   Pain in lower limb   Pleural effusion, left   Gallbladder disease    Results:   Labs: No results found for this or any previous visit (from the past 70 hour(s)).  Imaging / Studies: Dg Chest 1 View  03/23/2015  CLINICAL DATA:  Left pleural effusion.  Post left thoracentesis EXAM: CHEST 1 VIEW COMPARISON:  03/22/2015 FINDINGS: Decreasing left  pleural effusion. Probable small left pleural effusion remains. No pneumothorax following thoracentesis. Linear areas of scarring or atelectasis in the lungs bilaterally. Heart is borderline in size. IMPRESSION: Significant decrease in left pleural effusion following thoracentesis. No pneumothorax. Electronically Signed   By: Rolm Baptise M.D.   On: 03/23/2015 09:59   US Thoracentesis Asp Pleural Space W/img Guide  03/23/2015  CLINICAL DATA:  CHF, left pleural effusion, shortness of breath EXAM: ULTRASOUND GUIDED LEFT THORACENTESIS COMPARISON:  03/23/2015 PROCEDURE: An ultrasound guided thoracentesis was thoroughly discussed with the patient and questions answered. The benefits, risks, alternatives and complications were also discussed. The patient understands and wishes to proceed with the procedure. Written consent was obtained. Ultrasound was performed to localize and mark an adequate pocket of fluid in the left chest. The area was then prepped and draped in the normal sterile fashion. 1% Lidocaine was used for local anesthesia. Under ultrasound guidance a safety centesis needle catheter was introduced. Thoracentesis was performed. The catheter was removed and a dressing applied. Complications:  None immediate FINDINGS: A total of approximately 1 L of serosanguineous amber pleural fluid was removed. A fluid sample wassent for laboratory analysis. IMPRESSION: Successful ultrasound guided left thoracentesis yielding 1 L of pleural fluid. Electronically Signed   By: Jerilynn Mages.  Shick M.D.   On: 03/23/2015  09:46    Medications / Allergies:  Scheduled Meds: . antiseptic oral rinse  7 mL Mouth Rinse q12n4p  . chlorhexidine  15 mL Mouth Rinse BID  . docusate sodium  100 mg Oral BID  . feeding supplement  1 Container Oral TID BM  . furosemide  20 mg Oral Daily  . heparin subcutaneous  5,000 Units Subcutaneous 3 times per day  . losartan  50 mg Oral Daily  . methylPREDNISolone (SOLU-MEDROL) injection  40 mg  Intravenous Q12H  . metoprolol succinate  100 mg Oral Daily  . pantoprazole  40 mg Oral Daily  . polyethylene glycol  17 g Oral BID  . potassium chloride SA  40 mEq Oral Daily  . pregabalin  75 mg Oral TID  . sertraline  100 mg Oral QHS   Continuous Infusions:  PRN Meds:.acetaminophen, HYDROmorphone (DILAUDID) injection, levalbuterol, ondansetron (ZOFRAN) IV, oxyCODONE-acetaminophen  Antibiotics: Anti-infectives    Start     Dose/Rate Route Frequency Ordered Stop   03/19/15 0930  ceFAZolin (ANCEF) IVPB 2 g/50 mL premix     2 g 100 mL/hr over 30 Minutes Intravenous To Surgery 03/19/15 0915 03/19/15 1300       Assessment/Plan: S/P lap chole, IOC 12/19 - doing well from this standoint Deconditioning - PT/OT SNF, up to chair, ambulate as tolerated.  Pleural effusion - appreciate TCTS F/U. S/P L thoracentesis 12/23- QL off Resp failure - improved, off 02.  VTE - SQ hep DIspo - to floor.  SNF when available    Erby Pian, ANP-BC [facility]    03/24/2015 7:52 AM

## 2015-03-24 NOTE — Progress Notes (Signed)
Pt transferred from [facility] this pm. Alert,oriented and able to voice needs but is very hard of hearing even with hearing aids. Family at bedside. No concerns expressed at this time

## 2015-03-24 NOTE — Clinical Social Work Note (Signed)
Clinical Social Work Assessment  Patient Details  Name: [name] MRN: [id number] Date of Birth: April 09, 1915  Date of referral:  03/24/15               Reason for consult:  Facility Placement                Permission sought to share information with:  Facility Sport and exercise psychologist, Family Supports Permission granted to share information::  Yes, Verbal Permission Granted  Name::     [place]::  [facility] SNFs  Relationship::  Daughter  Contact Information:  [phone]  Housing/Transportation Living arrangements for the past 2 months:  [place] of Information:  Adult Children Patient Interpreter Needed:  None Criminal Activity/Legal Involvement Pertinent to Current Situation/Hospitalization:  No - Comment as needed Significant Relationships:  Adult Children Lives with:  Adult Children Do you feel safe going back to the place where you live?  No Need for family participation in patient care:  Yes (Comment)  Care giving concerns:  CSW received referral for possible SNF placement at time of discharge. CSW spoke w/patient's daughter regarding PT recommendation of SNF placement at time of discharge. Per patient's daughter, patient normally lives w/ patient's son and is independent. Patient's son is currently unable to care for patient at their home given patient's current physical needs and fall risk. Patient and patient's daughter expressed understanding of PT recommendation and are agreeable to SNF placement at time of discharge. CSW to continue to follow and assist with discharge planning needs.   Social Worker assessment / plan:  CSW spoke with patient's daughter concerning possibility of rehab at [facility] before returning home.  Employment status:  Retired Forensic scientist:  Medicare PT Recommendations:  [place] / Referral to community resources:  [place]  Patient/Family's Response to care:   Patient's daughter recognizes need for rehab before returning home and is agreeable to a SNF in [place]. Patient reported preference for [facility].  Patient/Family's Understanding of and Emotional Response to Diagnosis, Current Treatment, and Prognosis:  Patient is realistic regarding therapy needs. No questions/concerns about plan or treatment.    Emotional Assessment Appearance:  Appears stated age Attitude/Demeanor/Rapport:   (Completed assessment w/daughter) Affect (typically observed):    Orientation:  Oriented to Self, Oriented to Place, Oriented to  Time, Oriented to Situation Alcohol / Substance use:  Not Applicable Psych involvement (Current and /or in the community):  No (Comment)  Discharge Needs  Concerns to be addressed:  Care Coordination Readmission within the last 30 days:  No Current discharge risk:  None Barriers to Discharge:  Continued Medical Work up   [employer], [place] 03/24/2015, 10:46 AM

## 2015-03-25 MED ORDER — BISACODYL 10 MG RE SUPP: 10.0000 mg | Freq: Every day | RECTAL | Status: DC | PRN

## 2015-03-25 NOTE — Progress Notes (Signed)
Patient ID: [name], female   DOB: [birth year], 81 y.o.   MRN: [id number]  [facility]  POD#: 6  Subjective: Patient now on 6N floor.  Up in chair eating regular breakfast.  BM this AM.  Objective: Vital signs in last 24 hours: Temp:  [98.1 F (36.7 C)-98.6 F (37 C)] 98.6 F (37 C) (12/25 0543) Pulse Rate:  [73-88] 74 (12/25 0543) Resp:  [20-22] 20 (12/25 0543) BP: (122-149)/(61-78) 146/70 mmHg (12/25 0543) SpO2:  [90 %-97 %] 95 % (12/25 0543) Weight:  [79.1 kg (174 lb 6.1 oz)] 79.1 kg (174 lb 6.1 oz) (12/25 0543) Last BM Date:  (prior to surgery)  Intake/Output from previous day: 12/24 0701 - 12/25 0700 In: 900 [P.O.:900] Out: 2200 [Urine:2200] Intake/Output this shift:    Physical Exam: HEENT - sclerae clear, mucous membranes moist Neck - soft Chest - clear bilaterally Cor - RRR Abdomen - soft without distension; BS present; non-tender  Lab Results:  No results for input(s): WBC, HGB, HCT, PLT in the last 72 hours. BMET No results for input(s): NA, K, CL, CO2, GLUCOSE, BUN, CREATININE, CALCIUM in the last 72 hours. PT/INR No results for input(s): LABPROT, INR in the last 72 hours. Comprehensive Metabolic Panel:    Component Value Date/Time   NA 134* 03/22/2015 0237   NA 135 03/21/2015 1430   NA 128* 02/08/2015 1114   NA 140 07/24/2014 0909   K 4.2 03/22/2015 0237   K 4.2 03/21/2015 1430   CL 95* 03/22/2015 0237   CL 99* 03/21/2015 1430   CO2 29 03/22/2015 0237   CO2 28 03/21/2015 1430   BUN 20 03/22/2015 0237   BUN 10 03/21/2015 1430   BUN 21 02/08/2015 1114   BUN 19 07/24/2014 0909   CREATININE 0.78 03/22/2015 0237   CREATININE 0.70 03/21/2015 1430   GLUCOSE 169* 03/22/2015 0237   GLUCOSE 160* 03/21/2015 1430   GLUCOSE 113* 02/08/2015 1114   GLUCOSE 96 07/24/2014 0909   CALCIUM 9.0 03/22/2015 0237   CALCIUM 8.4* 03/21/2015 1430   AST 43* 03/20/2015 0422   AST 18 03/16/2015 1120   ALT 35 03/20/2015  0422   ALT 16 03/16/2015 1120   ALKPHOS 58 03/20/2015 0422   ALKPHOS 63 03/16/2015 1120   BILITOT 0.6 03/20/2015 0422   BILITOT 0.8 03/16/2015 1120   PROT 5.3* 03/20/2015 0422   PROT 6.3* 03/16/2015 1120   PROT 6.8 10/03/2013 1215   ALBUMIN 2.8* 03/20/2015 0422   ALBUMIN 3.8 03/16/2015 1120   ALBUMIN 4.5 10/03/2013 1215    Studies/Results: Dg Chest 1 View  03/23/2015  CLINICAL DATA:  Left pleural effusion.  Post left thoracentesis EXAM: CHEST 1 VIEW COMPARISON:  03/22/2015 FINDINGS: Decreasing left pleural effusion. Probable small left pleural effusion remains. No pneumothorax following thoracentesis. Linear areas of scarring or atelectasis in the lungs bilaterally. Heart is borderline in size. IMPRESSION: Significant decrease in left pleural effusion following thoracentesis. No pneumothorax. Electronically Signed   By: Rolm Baptise M.D.   On: 03/23/2015 09:59   US Thoracentesis Asp Pleural Space W/img Guide  03/23/2015  CLINICAL DATA:  CHF, left pleural effusion, shortness of breath EXAM: ULTRASOUND GUIDED LEFT THORACENTESIS COMPARISON:  03/23/2015 PROCEDURE: An ultrasound guided thoracentesis was thoroughly discussed with the patient and questions answered. The benefits, risks, alternatives and complications were also discussed. The patient understands and wishes to proceed with the procedure. Written consent was obtained. Ultrasound was performed to localize and mark an adequate  pocket of fluid in the left chest. The area was then prepped and draped in the normal sterile fashion. 1% Lidocaine was used for local anesthesia. Under ultrasound guidance a safety centesis needle catheter was introduced. Thoracentesis was performed. The catheter was removed and a dressing applied. Complications:  None immediate FINDINGS: A total of approximately 1 L of serosanguineous amber pleural fluid was removed. A fluid sample wassent for laboratory analysis. IMPRESSION: Successful ultrasound guided left  thoracentesis yielding 1 L of pleural fluid. Electronically Signed   By: Jerilynn Mages.  Shick M.D.   On: 03/23/2015 09:46    Anti-infectives: Anti-infectives    Start     Dose/Rate Route Frequency Ordered Stop   03/19/15 0930  ceFAZolin (ANCEF) IVPB 2 g/50 mL premix     2 g 100 mL/hr over 30 Minutes Intravenous To Surgery 03/19/15 0915 03/19/15 1300      Assessment & Plans: S/P lap chole, IOC 12/19  Doing well from this standoint  Tolerating diet  Had BM - will give suppository prn Deconditioning  PT/OT, up to chair, ambulate as tolerated  Planning SNF for rehab Pleural effusion  appreciate TCTS F/U. S/P L thoracentesis 12/23- QL off VTE  SQ hep DIspo  SNF when available   FL2 signed electronically  Earnstine Regal, MD, [facility] Office: [place] 03/25/2015

## 2015-03-25 NOTE — Progress Notes (Signed)
$'4mg'a$  iv zofran administered for c/o nausea

## 2015-03-25 NOTE — Progress Notes (Signed)
Pt transfers from bed to chair with maximum assistance but unable to ambulate in hallway as ordered. Very weak on feet. Currently sitting up in chair

## 2015-03-25 NOTE — Progress Notes (Signed)
1 po percocet administered for c/o pain

## 2015-03-25 NOTE — Progress Notes (Signed)
Cardiac monitoring discontinued this morning by Dr Harlow Asa

## 2015-03-26 NOTE — Progress Notes (Signed)
Patient ID: REEVE MALLO, female   DOB: May 15, 1915, 81 y.o.   MRN: [id number]  [facility]  POD#: 7  Subjective: Patient in bed, comfortable, waiting on her breakfast.  Drinking supplement.  Objective: Vital signs in last 24 hours: Temp:  [97.5 F (36.4 C)-98 F (36.7 C)] 97.5 F (36.4 C) (12/26 0529) Pulse Rate:  [71-78] 72 (12/26 0529) Resp:  [16-18] 16 (12/26 0529) BP: (109-129)/(71-74) 109/71 mmHg (12/26 0529) SpO2:  [95 %-98 %] 98 % (12/26 0529) Weight:  [77.4 kg (170 lb 10.2 oz)-77.5 kg (170 lb 13.7 oz)] 77.4 kg (170 lb 10.2 oz) (12/26 0529) Last BM Date: 03/25/15  Intake/Output from previous day: 12/25 0701 - 12/26 0700 In: 240 [P.O.:240] Out: 1150 [Urine:1150] Intake/Output this shift:    Physical Exam: HEENT - sclerae clear, mucous membranes moist Neck - soft Abdomen - soft without distension; wounds dry and intact with steristrips in place  Lab Results:  No results for input(s): WBC, HGB, HCT, PLT in the last 72 hours. BMET No results for input(s): NA, K, CL, CO2, GLUCOSE, BUN, CREATININE, CALCIUM in the last 72 hours. PT/INR No results for input(s): LABPROT, INR in the last 72 hours. Comprehensive Metabolic Panel:    Component Value Date/Time   NA 134* 03/22/2015 0237   NA 135 03/21/2015 1430   NA 128* 02/08/2015 1114   NA 140 07/24/2014 0909   K 4.2 03/22/2015 0237   K 4.2 03/21/2015 1430   CL 95* 03/22/2015 0237   CL 99* 03/21/2015 1430   CO2 29 03/22/2015 0237   CO2 28 03/21/2015 1430   BUN 20 03/22/2015 0237   BUN 10 03/21/2015 1430   BUN 21 02/08/2015 1114   BUN 19 07/24/2014 0909   CREATININE 0.78 03/22/2015 0237   CREATININE 0.70 03/21/2015 1430   GLUCOSE 169* 03/22/2015 0237   GLUCOSE 160* 03/21/2015 1430   GLUCOSE 113* 02/08/2015 1114   GLUCOSE 96 07/24/2014 0909   CALCIUM 9.0 03/22/2015 0237   CALCIUM 8.4* 03/21/2015 1430   AST 43* 03/20/2015 0422   AST 18 03/16/2015 1120   ALT 35  03/20/2015 0422   ALT 16 03/16/2015 1120   ALKPHOS 58 03/20/2015 0422   ALKPHOS 63 03/16/2015 1120   BILITOT 0.6 03/20/2015 0422   BILITOT 0.8 03/16/2015 1120   PROT 5.3* 03/20/2015 0422   PROT 6.3* 03/16/2015 1120   PROT 6.8 10/03/2013 1215   ALBUMIN 2.8* 03/20/2015 0422   ALBUMIN 3.8 03/16/2015 1120   ALBUMIN 4.5 10/03/2013 1215    Studies/Results: No results found.  Anti-infectives: Anti-infectives    Start     Dose/Rate Route Frequency Ordered Stop   03/19/15 0930  ceFAZolin (ANCEF) IVPB 2 g/50 mL premix     2 g 100 mL/hr over 30 Minutes Intravenous To Surgery 03/19/15 0915 03/19/15 1300      Assessment & Plans: S/P lap chole, IOC 12/19 Doing well from this standoint Tolerating diet Deconditioning PT/OT, up to chair, ambulate as tolerated Planning SNF for rehab Pleural effusion appreciate TCTS F/U. S/P L thoracentesis 12/23- QL off VTE SQ hep DIspo  Awaiting SNF this week per Care Management and Social Work [employer] signed electronically  Earnstine Regal, MD, [facility] Office: [place] 03/26/2015

## 2015-03-26 NOTE — Progress Notes (Signed)
Pt's daughter asking about foley discontinuation. Please review and evaluate need for continued foley catheter use

## 2015-03-26 NOTE — Care Management Note (Signed)
Case Management Note  Patient Details  Name: CHENNEL OLIVOS MRN: [id number] Date of Birth: [birth year]  Subjective/Objective:                    Action/Plan:   Expected Discharge Date:                  Expected Discharge Plan:  Skilled Nursing Facility  In-House Referral:  Clinical Social Work  Discharge planning Services     Post Acute Care Choice:    Choice offered to:  Patient  DME Arranged:    DME Agency:     HH Arranged:    [place] Agency:     Status of Service:  Completed, signed off  Medicare Important Message Given:  Yes Date Medicare IM Given:    Medicare IM give by:    Date Additional Medicare IM Given:    Additional Medicare Important Message give by:     If discussed at [place] of Stay Meetings, dates discussed:    Additional Comments:  Marilu Favre, RN 03/26/2015, 12:00 PM

## 2015-03-26 NOTE — Clinical Social Work Note (Addendum)
CSW spoke to patient's daughter [name] and presented bed offers.  CSW awaiting call back from patient's daughter on where they would like her to go.  CSW to continue to follow patient's progress.  3:30pm  CSW received phone call from patient's family who would like patient to go to [facility] as first choice, [facility] as a second choice, [facility] as a third choice, and [facility] as a Education officer, museum.  CSW attempted to call [facility] about SNF bed, and the admissions person is not available today, and will be available tomorrow.  CSW to follow up with [facility] tomorrow morning to see if they can take patient.  Patient's family requests private room, CSW informed them that the SNFs usually do not have private rooms available immediately, and patient would have to go on a wait list at facility.  Patient's daughter was in agreement to this.  Jones Broom. [place], MSW, [place] 03/26/2015 06:00pm

## 2015-03-26 NOTE — Progress Notes (Signed)
Physical Therapy Treatment Patient Details Name: MARKITTA AUSBURN MRN: [id number] DOB: [birth year] Today's Date: 03/26/2015    History of Present Illness 81 y/o female with PMH of HTN, Anxiety, Deafness, Lung mass, recent recurrent h/o abdominal pains is admitted for cholecystectomy by surgery.  Now s/p laparoscopic cholecystectomy.    PT Comments    Making slow progress with ambulation and functional mobility; Walked on 2 L supplemental O2, and O2 sats remained in the 90s; I anticipate continuing progress in post-acute rehab at SNF  Follow Up Recommendations  Supervision/Assistance - 24 hour;SNF     Equipment Recommendations  None recommended by PT    Recommendations for Other Services       Precautions / Restrictions Precautions Precautions: Fall Precaution Comments: pt is deaf; has white board for communication Restrictions Weight Bearing Restrictions: No    Mobility  Bed Mobility                  Transfers Overall transfer level: Needs assistance Equipment used: Rolling walker (2 wheeled) Transfers: Sit to/from Stand Sit to Stand: Min assist         General transfer comment: Min assist to power up; cues to scoot to edge of chair before standing; Sat down somewhat impulsively after walk, before reaching back for the chair  Ambulation/Gait Ambulation/Gait assistance: Min guard Ambulation Distance (Feet): 80 Feet Assistive device: Rolling walker (2 wheeled) Gait Pattern/deviations: Step-through pattern;Decreased stride length;Trunk flexed Gait velocity: slower   General Gait Details: Min instability although no overt LOB.  Close min guard for pt's safety.  Cues to self-monitor for activity tolerance; seems to walk better with her shoes on   Stairs            Wheelchair Mobility    Modified Rankin (Stroke Patients Only)       Balance             Standing balance-Leahy Scale: Poor                      Cognition  Arousal/Alertness: Awake/alert Behavior During Therapy: WFL for tasks assessed/performed Overall Cognitive Status: Within Functional Limits for tasks assessed                      Exercises      General Comments General comments (skin integrity, edema, etc.): Session conducted on 2 L supplemental O2, and sats remained greater than or equal to 94%      Pertinent Vitals/Pain Pain Assessment: Faces Faces Pain Scale: Hurts a little bit Pain Location: surgery site Pain Descriptors / Indicators: Grimacing Pain Intervention(s): Monitored during session    Home Living                      Prior Function            PT Goals (current goals can now be found in the care plan section) Acute Rehab PT Goals Patient Stated Goal: to get rehab before going home PT Goal Formulation: With patient Time For Goal Achievement: 03/30/15 Potential to Achieve Goals: Good Progress towards PT goals: Progressing toward goals    Frequency  Min 3X/week    PT Plan Current plan remains appropriate    Co-evaluation             End of Session Equipment Utilized During Treatment: Gait belt;Oxygen Activity Tolerance: Patient tolerated treatment well;Patient limited by fatigue Patient left: in chair;with call bell/phone within reach;with family/visitor  present     Time: 6415-8309 PT Time Calculation (min) (ACUTE ONLY): 22 min  Charges:  $Gait Training: 8-22 mins                    G Codes:      Quin Hoop 03/26/2015, 10:16 AM  Roney Marion, PT  Acute Rehabilitation Services Pager [phone] Office [phone]

## 2015-03-27 MED ORDER — OXYCODONE-ACETAMINOPHEN 5-325 MG PO TABS: 1.0000 | ORAL_TABLET | Freq: Four times a day (QID) | ORAL | Status: DC | PRN

## 2015-03-27 MED ORDER — GLUCERNA SHAKE PO LIQD: 237.0000 mL | Freq: Two times a day (BID) | ORAL | Status: DC

## 2015-03-27 MED ORDER — POLYETHYLENE GLYCOL 3350 17 G PO PACK: 17.0000 g | PACK | Freq: Two times a day (BID) | ORAL | Status: DC

## 2015-03-27 MED ORDER — ACETAMINOPHEN 325 MG PO TABS: 325.0000 mg | ORAL_TABLET | ORAL | Status: DC | PRN

## 2015-03-27 MED ORDER — BOOST / RESOURCE BREEZE PO LIQD: 1.0000 | Freq: Three times a day (TID) | ORAL | Status: DC

## 2015-03-27 MED ORDER — DOCUSATE SODIUM 100 MG PO CAPS: 100.0000 mg | ORAL_CAPSULE | Freq: Two times a day (BID) | ORAL | Status: DC | PRN

## 2015-03-27 NOTE — Progress Notes (Signed)
IV removed. Patient assisted with getting dressed. EMT present to get patient.

## 2015-03-27 NOTE — Progress Notes (Signed)
Nutrition Follow-up  DOCUMENTATION CODES:   Non-severe (moderate) malnutrition in context of chronic illness  INTERVENTION:   -D/c Boost Breeze po TID, each supplement provides 250 kcal and 9 grams of protein -Glucerna Shake po BID, each supplement provides 220 kcal and 10 grams of protein -Provide chopped meats with additional gravy for ease of intake  NUTRITION DIAGNOSIS:   Malnutrition related to chronic illness as evidenced by mild depletion of muscle mass, percent weight loss (5% weight loss within the past month).  Ongoing  GOAL:   Patient will meet greater than or equal to 90% of their needs  Progressing  MONITOR:   Diet advancement, PO intake, Supplement acceptance, Weight trends, Labs  REASON FOR ASSESSMENT:   Malnutrition Screening Tool    ASSESSMENT:   Patient admitted on 12/16 with abdominal pain. S/P laparoscopic cholecystectomy 12/19 for chronic calculus cholecystitis - symptomatic cholelithiasis.  Spoke with pt at bedside; she is [facility], however, communicates well writing on a dry erase board. She reveals that her appetite has improved greatly since admission. Meal completion 75-100% of a soft diet. Pt complains that some of the meats are "too hard", but is amenable to chopped meats with gravy for ease of intake.   Noted pt is refusing Boost Breeze supplements; she reports she is not fond of the taste and is concerned about the impact of the carbohydrate count on her blood sugar. Family members brought in [place], which she reveals she was drinking at home PTA.   Pt and son reveal that pt is supposed to discharge to SNF rehab today. Discussed importance of good meal and supplement intake to promote healing.   Labs reviewed: Na: 134.   Diet Order:  DIET SOFT Room service appropriate?: Yes; Fluid consistency:: Thin  Skin:  Reviewed, no issues  Last BM:  03/25/15  Height:   Ht Readings from Last 1 Encounters:  03/22/15 '5\' 6"'$  (1.676 m)    Weight:    Wt Readings from Last 1 Encounters:  03/27/15 167 lb 15.9 oz (76.2 kg)    Ideal Body Weight:  59.1 kg  BMI:  Body mass index is 27.13 kg/(m^2).  Estimated Nutritional Needs:   Kcal:  1550-1750  Protein:  85-95 gm  Fluid:  1.6-1.8 L  EDUCATION NEEDS:   No education needs identified at this time  Rickard Kennerly A. Jimmye Norman, RD, LDN, CDE Pager: [phone] After hours Pager: [phone]

## 2015-03-27 NOTE — Discharge Summary (Signed)
[facility] Discharge Summary   Patient ID: [name] MRN: [id number] DOB/AGE: 12/19/1915 81 y.o.  Admit date: 03/16/2015 Discharge date: 03/27/2015  Admitting Diagnosis: Chronic cholecystitis Symptomatic cholelithiasis Hard of hearing Left pleural effusion H/o lung nodule H/o GERD Arthritis HTN  Discharge Diagnosis Patient Active Problem List   Diagnosis Date Noted  . Gallbladder disease 03/19/2015  . Symptomatic cholelithiasis 03/16/2015  . Pleural effusion, left 03/16/2015  . Atrophic vaginitis 12/29/2014  . Pain in lower limb 06/01/2013  . Ingrown nail 06/01/2013  . Hemorrhoids 03/16/2013  . Hereditary and idiopathic peripheral neuropathy 03/16/2013  . Solitary pulmonary nodule 12/14/2012  . Edema 10/28/2012  . Urinary hesitancy 10/28/2012  . Anemia, iron deficiency 09/06/2012  . Onychomycosis 08/04/2012  . Neck pain 06/24/2012  . Vulvar burning 05/29/2012  . Tricompartment degenerative joint disease of knee 12/10/2011  . Tinnitus 10/20/2011  . Hearing loss 08/11/2011  . Dizziness 03/05/2011  . KNEE PAIN 02/14/2010  . Hyperlipidemia 01/13/2007  . Essential hypertension, benign 01/13/2007  . HAY FEVER 01/13/2007  . GERD 01/13/2007  . ARTHRITIS 01/13/2007  . PLANTAR FASCIITIS 01/13/2007  . Personal history of unspecified circulatory disease 01/13/2007  . HIATAL HERNIA, HX OF 01/13/2007    Consultants Dr. [name] (Cardiothoracic surgery) Dr. Marily Memos, Dr. Daleen Bo, Dr. Aileen Fass (Internal medicine)  Imaging: No results found.  Procedures Dr. Georgette Dover (03/19/15) - Laparoscopic Cholecystectomy with [facility] Hospital Course:  81 y/o who was seen in the ED on 03/01/15. CT scan showed: Interval development of left-sided pleural effusion associated with left basilar atelectasis. Coronary artery disease and mitral calcification. Sludge and stones within the gallbladder. Small renal cyst, normal appendix and AAA. [place] 03/01/15: shows multiple  stones, no GB wall thickening or pericholecystic fluid, CBD 5.5 mm. She was back on 03/09/15 with pain. Repeat US shows some GB wall thickening 1.6 mm, no sign of cholecystitis. CXR shows a left apical mass, and left pleural effusion.  Labs on 03/09/15 showed a normal UA, lipase, LFT's, and CBC. Family has been calling office almost daily asking to be seen earlier for ongoing symptoms. She was seen by Dr. Barry Dienes on 03/05/15, and scheduled for surgery. She continues to have symptoms and says she cannot stand it.She was seen by PCP on 03/13/15, with back and abdominal pain, indigestion, not eating and ongoing weight loss. She has been admitted by Dr. Barry Dienes now for further evaluation and treatment.  With the pleural effusion and hx of LUL nodule which she has seen Dr. [name] in the past for potential malignancy.  She does not want any workup regarding her lungs.    The patient was cleared for surgery and did well post-operatively.  Diet was advanced as tolerated.  BM's resumed.  However, her pleural effusion enlarged and a Left thoracentesis was performed by Dr. Annamaria Boots which evacuated 1L fluid.  Cytology still pending today.  She has since improved in her breathing.  O2 was weaned.  On POD #8, the patient was voiding well, tolerating diet, ambulating well, pain well controlled, vital signs stable, incisions c/d/i and felt stable for discharge to SNF.  Patient will follow up in our office in 2 weeks and knows to call with questions or concerns.  She will follow up with Dr. [name] and her PCP upon discharge.      Medication List    TAKE these medications        acetaminophen 325 MG tablet  Commonly known as:  TYLENOL  Take 1-2 tablets (325-650 mg  total) by mouth every 4 (four) hours as needed for mild pain.     docusate sodium 100 MG capsule  Commonly known as:  COLACE  Take 1 capsule (100 mg total) by mouth 2 (two) times daily as needed for mild constipation.     feeding supplement Liqd   Take 1 Container by mouth 3 (three) times daily between meals.     furosemide 20 MG tablet  Commonly known as:  LASIX  Take 20 mg by mouth daily.     KLOR-CON M20 20 MEQ tablet  Generic drug:  potassium chloride SA  Take 40 mEq by mouth daily.     losartan 50 MG tablet  Commonly known as:  COZAAR  TAKE 1 TABLET (50 MG TOTAL) BY MOUTH DAILY.     metoprolol succinate 100 MG 24 hr tablet  Commonly known as:  TOPROL-XL  TAKE ONE TABLET BY MOUTH ONCE DAILY.     metroNIDAZOLE 0.75 % cream  Commonly known as:  METROCREAM  Apply 1 application topically daily.     multivitamin with minerals Tabs tablet  Take 1 tablet by mouth daily.     oxyCODONE-acetaminophen 5-325 MG tablet  Commonly known as:  PERCOCET/ROXICET  Take 1 tablet by mouth every 6 (six) hours as needed for moderate pain.     polyethylene glycol packet  Commonly known as:  MIRALAX / GLYCOLAX  Take 17 g by mouth 2 (two) times daily.     pregabalin 75 MG capsule  Commonly known as:  LYRICA  Take 1 capsule (75 mg total) by mouth 3 (three) times daily.     PROBIOTIC DAILY PO  Take 1 tablet by mouth daily.     sertraline 100 MG tablet  Commonly known as:  ZOLOFT  Take 1 tablet (100 mg total) by mouth at bedtime.         Follow-up Information    Follow up with [name], MD. Schedule an appointment as soon as possible for a visit in 2 weeks.   Specialty:  Cardiothoracic Surgery   Why:  For post-hospital follow up, regarding your pleural effusion   Contact information:   [place] [id number] [phone]       Follow up with [facility]. Go on 04/04/2015.   Specialty:  General Surgery   Why:  For post-operation check. Your appointment is at 8:45am, please arrive at least 8mn before your appointment to complete your check in paperwork.  If you are unable to arrive 326m prior to your appointment time we may have to cancel or reschedule you.   Contact  information:   [id number]    Follow up with REED, TIFFANY, DO. Schedule an appointment as soon as possible for a visit in 2 weeks.   Specialty:  Geriatric Medicine   Why:  For post-hospital follow up   Contact information:   [phone]     Signed: MeNat ChristenPANovant [facility] [phone]/27/2016, 1:05 PM

## 2015-03-27 NOTE — Discharge Instructions (Signed)
Your appointment is at 8:45am, please arrive at least 12mn before your appointment to complete your check in paperwork.  If you are unable to arrive 377m prior to your appointment time we may have to cancel or reschedule you.  LAPAROSCOPIC SURGERY: POST OP INSTRUCTIONS  1. DIET: Follow a light bland diet the first 24 hours after arrival home, such as soup, liquids, crackers, etc. Be sure to include lots of fluids daily. Avoid fast food or heavy meals as your are more likely to get nauseated. Eat a low fat the next few days after surgery.  2. Take your usually prescribed home medications unless otherwise directed. 3. PAIN CONTROL:  1. Pain is best controlled by a usual combination of three different methods TOGETHER:  1. Ice/Heat 2. Over the counter pain medication 3. Prescription pain medication 2. Most patients will experience some swelling and bruising around the incisions. Ice packs or heating pads (30-60 minutes up to 6 times a day) will help. Use ice for the first few days to help decrease swelling and bruising, then switch to heat to help relax tight/sore spots and speed recovery. Some people prefer to use ice alone, heat alone, alternating between ice & heat. Experiment to what works for you. Swelling and bruising can take several weeks to resolve.  3. It is helpful to take an over-the-counter pain medication regularly for the first few weeks. Choose one of the following that works best for you:  1. Naproxen (Aleve, etc) Two '220mg'$  tabs twice a day 2. Ibuprofen (Advil, etc) Three '200mg'$  tabs four times a day (every meal & bedtime) 3. Acetaminophen (Tylenol, etc) 500-'650mg'$  four times a day (every meal & bedtime) 4. A prescription for pain medication (such as oxycodone, hydrocodone, etc) should be given to you upon discharge. Take your pain medication as prescribed.  1. If you are having problems/concerns with the prescription medicine (does not control pain, nausea, vomiting, rash, itching,  etc), please call [phone] see if we need to switch you to a different pain medicine that will work better for you and/or control your side effect better. 2. If you need a refill on your pain medication, please contact your pharmacy. They will contact our office to request authorization. Prescriptions will not be filled after 5 pm or on week-ends. 4. Avoid getting constipated. Between the surgery and the pain medications, it is common to experience some constipation. Increasing fluid intake and taking a fiber supplement (such as Metamucil, Citrucel, FiberCon, MiraLax, etc) 1-2 times a day regularly will usually help prevent this problem from occurring. A mild laxative (prune juice, Milk of Magnesia, MiraLax, etc) should be taken according to package directions if there are no bowel movements after 48 hours.  5. Watch out for diarrhea. If you have many loose bowel movements, simplify your diet to bland foods & liquids for a few days. Stop any stool softeners and decrease your fiber supplement. Switching to mild anti-diarrheal medications (Kayopectate, Pepto Bismol) can help. If this worsens or does not improve, please call usKorea6. Wash / shower every day. You may shower over the dressings as they are waterproof. Continue to shower over incision(s) after the dressing is off. 7. Remove your waterproof bandages 5 days after surgery. You may leave the incision open to air. You may replace a dressing/Band-Aid to cover the incision for comfort if you wish.  8. ACTIVITIES as tolerated:  1. You may resume regular (light) daily activities beginning the next day--such as daily self-care,  walking, climbing stairs--gradually increasing activities as tolerated. If you can walk 30 minutes without difficulty, it is safe to try more intense activity such as jogging, treadmill, bicycling, low-impact aerobics, swimming, etc. 2. Save the most intensive and strenuous activity for last such as sit-ups, heavy lifting,  contact sports, etc Refrain from any heavy lifting or straining until you are off narcotics for pain control.  3. DO NOT PUSH THROUGH PAIN. Let pain be your guide: If it hurts to do something, don't do it. Pain is your body warning you to avoid that activity for another week until the pain goes down. 4. You may drive when you are no longer taking prescription pain medication, you can comfortably wear a seatbelt, and you can safely maneuver your car and apply brakes. 5. You may have sexual intercourse when it is comfortable.  9. FOLLOW UP in our office  1. Please call [facility] at [phone] to set up an appointment to see your surgeon in the office for a follow-up appointment approximately 2-3 weeks after your surgery. 2. Make sure that you call for this appointment the day you arrive home to insure a convenient appointment time.      10. IF YOU HAVE DISABILITY OR FAMILY LEAVE FORMS, BRING THEM TO THE               OFFICE FOR PROCESSING.   WHEN TO CALL us [phone]:  1. Poor pain control 2. Reactions / problems with new medications (rash/itching, nausea, etc)  3. Fever over 101.5 F (38.5 C) 4. Inability to urinate 5. Nausea and/or vomiting 6. Worsening swelling or bruising 7. Continued bleeding from incision. 8. Increased pain, redness, or drainage from the incision  The clinic staff is available to answer your questions during regular business hours (8:30am-5pm). Please dont hesitate to call and ask to speak to one of our nurses for clinical concerns.  If you have a medical emergency, go to the nearest emergency room or call 911.  A surgeon from [facility] is always on call at the [facility], [place], [place], [place], [place] [address] ?  MAIN: [phone] ? TOLL FREE: [phone] ?  FAX [phone]  [email or web address]

## 2015-03-27 NOTE — Discharge Planning (Signed)
Patient to be discharged to [facility]. Patient's daughter and son updated.  Facility: [facility] RN report number: [phone] Transportation: Greig Castilla, [place] [facility]: [id number] Surgical: [phone]

## 2015-03-27 NOTE — Progress Notes (Signed)
Occupational Therapy Treatment Patient Details Name: [name] MRN: [id number] DOB: Nov 02, 1915 Today's Date: 03/27/2015    History of present illness 81 y/o female with PMH of HTN, Anxiety, Deafness, Lung mass, recent recurrent h/o abdominal pains is admitted for cholecystectomy by surgery.  Now s/p laparoscopic cholecystectomy.   OT comments  Pt progressing towards acute OT goals. Focus of session was sitting EOB to complete grooming tasks, in-room functional mobility and transfers. D/c plan remains appropriate.   Follow Up Recommendations  SNF    Equipment Recommendations  Other (comment) (defer)    Recommendations for Other Services      Precautions / Restrictions Precautions Precautions: Fall Precaution Comments: pt is deaf; has white board for communication Restrictions Weight Bearing Restrictions: No       Mobility Bed Mobility Overal bed mobility: Needs Assistance Bed Mobility: Supine to Sit;Sit to Supine     Supine to sit: Min assist;HOB elevated Sit to supine: Supervision   General bed mobility comments: min assist to powerup trunk to EOB. returned to supine without physical assist. Scooted up in bed using bed rail with LUE without physical A.   Transfers Overall transfer level: Needs assistance Equipment used: Rolling walker (2 wheeled) Transfers: Sit to/from Stand Sit to Stand: Min assist         General transfer comment: Min A to power up and to control descent. Cues for technique with rw including to position in front of self during pivoting to sit and not leave to the side.    Balance Overall balance assessment: Needs assistance Sitting-balance support: No upper extremity supported;Feet supported Sitting balance-Leahy Scale: Good Sitting balance - Comments: EOB grooming   Standing balance support: Bilateral upper extremity supported;During functional activity Standing balance-Leahy Scale: Poor                     ADL Overall  ADL's : Needs assistance/impaired     Grooming: Wash/dry hands;Wash/dry face;Oral care;Brushing hair;Set up;Supervision/safety;Sitting Grooming Details (indicate cue type and reason): sat EOB about 5 minutes to complete grooming tasks                             Functional mobility during ADLs: Minimal assistance;Rolling walker General ADL Comments: Pt completed bed mobility, grooming EOB, in-room functioal mobility and transfers from recliner and EOB. Min A needed at times for balance and for safety during pivoting to sit.       Vision                     Perception     Praxis      Cognition   Behavior During Therapy: WFL for tasks assessed/performed Overall Cognitive Status: Within Functional Limits for tasks assessed                       Extremity/Trunk Assessment               Exercises     Shoulder Instructions       General Comments      Pertinent Vitals/ Pain       Pain Assessment: Faces Faces Pain Scale: Hurts even more Pain Location: surgical area, wraps around to back Pain Descriptors / Indicators: Grimacing;Operative site guarding Pain Intervention(s): Monitored during session;Repositioned;Limited activity within patient's tolerance  Home Living  Prior Functioning/Environment              Frequency Min 2X/week     Progress Toward Goals  OT Goals(current goals can now be found in the care plan section)  Progress towards OT goals: Progressing toward goals  Acute Rehab OT Goals Patient Stated Goal: to get rehab before going home OT Goal Formulation: With patient Time For Goal Achievement: 04/06/15 Potential to Achieve Goals: Good ADL Goals Pt Will Perform Grooming: with supervision;sitting Pt Will Perform Upper Body Bathing: with supervision;sitting Pt Will Transfer to Toilet: with min guard assist;ambulating;bedside commode  Plan Discharge plan  remains appropriate    Co-evaluation                 End of Session Equipment Utilized During Treatment: Gait belt;Rolling walker;Oxygen   Activity Tolerance Patient tolerated treatment well   Patient Left in bed;with call bell/phone within reach;with family/visitor present   Nurse Communication          Time: 1030-1057 OT Time Calculation (min): 27 min  Charges: OT General Charges $OT Visit: 1 Procedure OT Treatments $Self Care/Home Management : 23-37 mins  Hortencia Pilar 03/27/2015, 11:23 AM

## 2015-03-27 NOTE — Progress Notes (Signed)
Report called to Gay[place] at [facility]

## 2015-03-27 NOTE — Care Management Important Message (Signed)
Important Message  Patient Details  Name: [name] MRN: [id number] Date of Birth: Jun 05, 1915   Medicare Important Message Given:  Yes    Nathen May 03/27/2015, 12:34 PM

## 2015-03-27 NOTE — Progress Notes (Signed)
[facility] Progress Note  8 Days Post-Op  Subjective: Pt feels okay, c/o lower chest pain.  Denies pain at incisions.  On O2 currently.  Tolerating diet, only minimal nausea, but still able to eat.  Having BM's and flatus.  Objective: Vital signs in last 24 hours: Temp:  [97.5 F (36.4 C)-97.9 F (36.6 C)] 97.5 F (36.4 C) (12/27 0509) Pulse Rate:  [65-85] 70 (12/27 0509) Resp:  [17-20] 20 (12/27 0509) BP: (120-143)/(52-72) 137/72 mmHg (12/27 0509) SpO2:  [97 %-100 %] 97 % (12/27 0509) Weight:  [76.2 kg (167 lb 15.9 oz)] 76.2 kg (167 lb 15.9 oz) (12/27 0509) Last BM Date: 03/25/15  Intake/Output from previous day: 12/26 0701 - 12/27 0700 In: 880 [P.O.:880] Out: 1600 [Urine:1600] Intake/Output this shift:    PE: Gen:  Alert, NAD, pleasant Abd: Soft, NT/ND, +BS, no HSM, incisions C/D/I, steri-strips in place   Lab Results:  No results for input(s): WBC, HGB, HCT, PLT in the last 72 hours. BMET No results for input(s): NA, K, CL, CO2, GLUCOSE, BUN, CREATININE, CALCIUM in the last 72 hours. PT/INR No results for input(s): LABPROT, INR in the last 72 hours. CMP     Component Value Date/Time   NA 134* 03/22/2015 0237   NA 128* 02/08/2015 1114   K 4.2 03/22/2015 0237   CL 95* 03/22/2015 0237   CO2 29 03/22/2015 0237   GLUCOSE 169* 03/22/2015 0237   GLUCOSE 113* 02/08/2015 1114   BUN 20 03/22/2015 0237   BUN 21 02/08/2015 1114   CREATININE 0.78 03/22/2015 0237   CALCIUM 9.0 03/22/2015 0237   PROT 5.3* 03/20/2015 0422   PROT 6.8 10/03/2013 1215   ALBUMIN 2.8* 03/20/2015 0422   ALBUMIN 4.5 10/03/2013 1215   AST 43* 03/20/2015 0422   ALT 35 03/20/2015 0422   ALKPHOS 58 03/20/2015 0422   BILITOT 0.6 03/20/2015 0422   GFRNONAA >60 03/22/2015 0237   GFRAA >60 03/22/2015 0237   Lipase     Component Value Date/Time   LIPASE 38 03/16/2015 1120       Studies/Results: No results found.  Anti-infectives: Anti-infectives    Start     Dose/Rate  Route Frequency Ordered Stop   03/19/15 0930  ceFAZolin (ANCEF) IVPB 2 g/50 mL premix     2 g 100 mL/hr over 30 Minutes Intravenous To Surgery 03/19/15 0915 03/19/15 1300       Assessment/Plan Chronic calculus cholecystitis, symptomatic cholelithiasis POD #8 S/P lap chole, IOC 12/19 -Doing well from this standoint -Tolerating diet -Steri-strips in place, no suture to be removed Deconditioning -PT/OT, up to chair, ambulate as tolerated -Planning SNF for rehab Pleural effusion -Appreciate TCTS F/U. S/P L thoracentesis 12/23- QL off -Still has lower chest pain -Wean oxygen -F/u with Dr. [name] at discharge, may need PleurX catheter, don't see where cytology was obtained -Foley d/c now that has good urine output VTE - SQ hep Dispo -Awaiting SNF today if bed available per Care Management and Social Work -[employer] first choice    LOS: 11 days    Nat Christen 03/27/2015, 8:05 AM Pager: [phone]

## 2015-03-27 NOTE — Clinical Social Work Placement (Signed)
   CLINICAL SOCIAL WORK PLACEMENT  NOTE  Date:  03/27/2015  Patient Details  Name: [name] MRN: [id number] Date of Birth: [birth year]  Clinical Social Work is seeking post-discharge placement for this patient at the [place] level of care (*CSW will initial, date and re-position this form in  chart as items are completed):      Patient/family provided with [facility] Work Department's list of facilities offering this level of care within the geographic area requested by the patient (or if unable, by the patient's family).      Patient/family informed of their freedom to choose among providers that offer the needed level of care, that participate in Medicare, Medicaid or managed care program needed by the patient, have an available bed and are willing to accept the patient.      Patient/family informed of [facility]'s ownership interest in [facility] and [facility], as well as of the fact that they are under no obligation to receive care at these facilities.  PASRR submitted to EDS on       PASRR number received on       Existing PASRR number confirmed on 03/24/15     FL2 transmitted to all facilities in geographic area requested by pt/family on 03/24/15     FL2 transmitted to all facilities within larger geographic area on       Patient informed that his/her managed care company has contracts with or will negotiate with certain facilities, including the following:        Yes   Patient/family informed of bed offers received.  Patient chooses bed at [facility] recommends and patient chooses bed at      Patient to be transferred to [facility] on 03/27/15.  Patient to be transferred to facility by [facility]     Patient family notified on 03/27/15 of transfer.  Name of family member notified:  Wynonia Lawman, son     PHYSICIAN Please sign FL2, Please sign DNR     Additional Comment:     _______________________________________________ Caroline Sauger, LCSW 03/27/2015, 1:56 PM

## 2015-03-29 ENCOUNTER — Non-Acute Institutional Stay (SKILLED_NURSING_FACILITY): Payer: Medicare Other | Admitting: Internal Medicine

## 2015-03-29 ENCOUNTER — Encounter: Payer: Self-pay | Admitting: Internal Medicine

## 2015-03-29 DIAGNOSIS — K219 Gastro-esophageal reflux disease without esophagitis: Secondary | ICD-10-CM | POA: Diagnosis not present

## 2015-03-29 DIAGNOSIS — M2550 Pain in unspecified joint: Secondary | ICD-10-CM

## 2015-03-29 DIAGNOSIS — J90 Pleural effusion, not elsewhere classified: Secondary | ICD-10-CM

## 2015-03-29 DIAGNOSIS — H9193 Unspecified hearing loss, bilateral: Secondary | ICD-10-CM

## 2015-03-29 DIAGNOSIS — F411 Generalized anxiety disorder: Secondary | ICD-10-CM | POA: Diagnosis not present

## 2015-03-29 DIAGNOSIS — J948 Other specified pleural conditions: Secondary | ICD-10-CM

## 2015-03-29 DIAGNOSIS — Z9049 Acquired absence of other specified parts of digestive tract: Secondary | ICD-10-CM

## 2015-03-29 DIAGNOSIS — K5900 Constipation, unspecified: Secondary | ICD-10-CM

## 2015-03-29 DIAGNOSIS — R911 Solitary pulmonary nodule: Secondary | ICD-10-CM

## 2015-03-29 DIAGNOSIS — I10 Essential (primary) hypertension: Secondary | ICD-10-CM

## 2015-03-29 DIAGNOSIS — I959 Hypotension, unspecified: Secondary | ICD-10-CM

## 2015-03-29 DIAGNOSIS — G609 Hereditary and idiopathic neuropathy, unspecified: Secondary | ICD-10-CM | POA: Diagnosis not present

## 2015-03-29 NOTE — Progress Notes (Deleted)
Patient ID: ADALEEN HULGAN, female   DOB: [birth year], 81 y.o.   MRN: [id number]    HISTORY AND PHYSICAL   DATE: 03/29/15  Location:  [facility]    Place of Service: SNF (31)   Extended Emergency Contact Information Primary Emergency Contact: Chrostowski,Clyde Address: [address], [place] [address] Johnnette Litter of [place] Phone: [phone] Mobile Phone: [phone] Relation: Son Secondary Emergency Contact: Julieta Gutting [place] of [place] Phone: [phone] Mobile Phone: [phone] Relation: Daughter  Advanced Directive information  DNR  Chief Complaint  Patient presents with  . New Admit To SNF    HPI:  81 yo female seen today as a new admission into SNF following hospital stay for chronic cholecystitis with symptomatic cholelithiasis, left pleural effusion, known lung nodue, HTN, GERD, arthritis.  Her son is present  Past Medical History  Diagnosis Date  . History of hiatal hernia   . Mitral valve prolapse   . Plantar fasciitis   . Hay fever   . GERD (gastroesophageal reflux disease)   . Palpitations   . Neuromuscular disorder ([place])   . Deafness     "very HOH; even w/hearing aides we have to communicate via writing things down for her to read" (08/25/2012)  . H/O hiatal hernia   . Arthritis     "a little all over" (08/25/2012)  . Lung mass     Hx: of  . Anxiety   . Hemorrhoids   . Neuropathy ([place])   . Hypertension   . Hyperglycemia     Past Surgical History  Procedure Laterality Date  . Foot surgery Bilateral ~ 2000    "pinched nerve on feet" (08/25/2012)  . Vesicovaginal fistula closure w/ tah    . Abdominal hysterectomy  1998  . Tonsillectomy and adenoidectomy  1940's  . Cesarean section  1952  . Knee arthroscopy w/ meniscectomy Left 07/15/2010    partial medial and lateral meniscectomies.Archie Endo 07/15/2010 (08/25/2012)  . Hip arthroplasty Right 08/26/2012    Procedure: ARTHROPLASTY  BIPOLAR HIP;  Surgeon: Mauri Pole, MD;  Location: [place];  Service: Orthopedics;  Laterality: Right;  . Open reduction internal fixation (orif) distal radial fracture Right 09/11/2012    Procedure: OPEN REDUCTION INTERNAL FIXATION (ORIF) RIGHT DISTAL RADIAL FRACTURE/ULNAR POSSIBLE BONE GRAFTING;  Surgeon: Linna Hoff, MD;  Location: [place];  Service: Orthopedics;  Laterality: Right;  . Cataract extraction, bilateral Bilateral   . Laparoscopic cholecystectomy  03/19/2015  . Cholecystectomy N/A 03/19/2015    Procedure: LAPAROSCOPIC CHOLECYSTECTOMY WITH INTRAOPERATIVE CHOLANGIOGRAM;  Surgeon: Donnie Mesa, MD;  Location: [facility];  Service: General;  Laterality: N/A;    Patient Care Team: Gayland Curry, DO as PCP - General (Geriatric Medicine) Anastasio Auerbach, MD as Consulting Physician (Gynecology)  Social History   Social History  . Marital Status: Widowed    Spouse Name: N/A  . Number of Children: N/A  . Years of Education: N/A   Occupational History  . Not on file.   Social History Main Topics  . Smoking status: Never Smoker   . Smokeless tobacco: Never Used  . Alcohol Use: No  . Drug Use: No  . Sexual Activity: No   Other Topics Concern  . Not on file   Social History Narrative   ** Merged History Encounter **       Married 57 years - widowed 1996   1 son, 1 daughter  2 grandsons   Lives in her own home, son lives with her. Tries to walk every day. Starting to get out more after ortho injury. I- ADLS.   End-of-life issues. DNR and no heroic or extraordinary measures.   Walks with cane     reports that she has never smoked. She has never used smokeless tobacco. She reports that she does not drink alcohol or use illicit drugs.  Family History  Problem Relation Age of Onset  . Colon cancer Mother   . Colon cancer Sister   . Cancer Sister     Breast  . Colon cancer Brother   . Heart disease Brother     Heart Attack  . Cancer Father     Leukemia  . Pneumonia  Sister    Family Status  Relation Status Death Age  . Mother Deceased   . Sister Alive   . Brother Deceased   . Father Deceased   . Daughter Alive   . Son Alive   . Maternal Grandmother Deceased   . Maternal Grandfather Deceased   . Paternal Grandmother Deceased   . Paternal Grandfather Deceased   . Brother Deceased   . Sister Deceased 31    Immunization History  Administered Date(s) Administered  . Influenza Split 03/04/2011  . Pneumococcal Conjugate-13 04/28/2014  . Pneumococcal Polysaccharide-23 07/08/2006  . Tdap 11/08/2013  . Zoster 03/03/2011    Allergies  Allergen Reactions  . Statins Other (See Comments)    myopathy  . Aleve [Naproxen Sodium] Swelling    Legs and feet    Medications: Patient's Medications  New Prescriptions   No medications on file  Previous Medications   ACETAMINOPHEN (TYLENOL) 325 MG TABLET    Take 1-2 tablets (325-650 mg total) by mouth every 4 (four) hours as needed for mild pain.   DOCUSATE SODIUM (COLACE) 100 MG CAPSULE    Take 1 capsule (100 mg total) by mouth 2 (two) times daily as needed for mild constipation.   FEEDING SUPPLEMENT (BOOST / RESOURCE BREEZE) LIQD    Take 1 Container by mouth 3 (three) times daily between meals.   FUROSEMIDE (LASIX) 20 MG TABLET    Take 20 mg by mouth daily.   KLOR-CON M20 20 MEQ TABLET    Take 40 mEq by mouth daily.   LOSARTAN (COZAAR) 50 MG TABLET    TAKE 1 TABLET (50 MG TOTAL) BY MOUTH DAILY.   METOPROLOL SUCCINATE (TOPROL-XL) 100 MG 24 HR TABLET    TAKE ONE TABLET BY MOUTH ONCE DAILY.   METRONIDAZOLE (METROCREAM) 0.75 % CREAM    Apply 1 application topically daily.   MULTIPLE VITAMIN (MULTIVITAMIN WITH MINERALS) TABS TABLET    Take 1 tablet by mouth daily.   OXYCODONE-ACETAMINOPHEN (PERCOCET/ROXICET) 5-325 MG TABLET    Take 1 tablet by mouth every 6 (six) hours as needed for moderate pain.   POLYETHYLENE GLYCOL (MIRALAX / GLYCOLAX) PACKET    Take 17 g by mouth 2 (two) times daily.   PREGABALIN  (LYRICA) 75 MG CAPSULE    Take 1 capsule (75 mg total) by mouth 3 (three) times daily.   PROBIOTIC PRODUCT (PROBIOTIC DAILY PO)    Take 1 tablet by mouth daily.   SERTRALINE (ZOLOFT) 100 MG TABLET    Take 1 tablet (100 mg total) by mouth at bedtime.  Modified Medications   No medications on file  Discontinued Medications   No medications on file    Review of Systems  Unable to perform ROS:  Other  pt very HOH  Filed Vitals:   03/29/15 2332  BP: 76/47  Pulse: 83  Temp: 97.5 F (36.4 C)  SpO2: 97%   There is no weight on file to calculate BMI.  Physical Exam   Labs reviewed: Admission on 03/16/2015, Discharged on 03/27/2015  Component Date Value Ref Range Status  . Sodium 03/16/2015 131* 135 - 145 mmol/L Final  . Potassium 03/16/2015 4.2  3.5 - 5.1 mmol/L Final  . Chloride 03/16/2015 97* 101 - 111 mmol/L Final  . CO2 03/16/2015 24  22 - 32 mmol/L Final  . Glucose, Bld 03/16/2015 117* 65 - 99 mg/dL Final  . BUN 07/29/6315 19  6 - 20 mg/dL Final  . Creatinine, Ser 03/16/2015 0.75  0.44 - 1.00 mg/dL Final  . Calcium 92/13/0397 9.3  8.9 - 10.3 mg/dL Final  . Total Protein 03/16/2015 6.3* 6.5 - 8.1 g/dL Final  . Albumin 49/15/1904 3.8  3.5 - 5.0 g/dL Final  . AST 67/93/8537 18  15 - 41 U/L Final  . ALT 03/16/2015 16  14 - 54 U/L Final  . Alkaline Phosphatase 03/16/2015 63  38 - 126 U/L Final  . Total Bilirubin 03/16/2015 0.8  0.3 - 1.2 mg/dL Final  . GFR calc non Af Amer 03/16/2015 >60  >60 mL/min Final  . GFR calc Af Amer 03/16/2015 >60  >60 mL/min Final   Comment: (NOTE) The eGFR has been calculated using the CKD EPI equation. This calculation has not been validated in all clinical situations. eGFR's persistently <60 mL/min signify possible Chronic Kidney Disease.   . Anion gap 03/16/2015 10  5 - 15 Final  . aPTT 03/16/2015 38* 24 - 37 seconds Final   Comment:        IF BASELINE aPTT IS ELEVATED, SUGGEST PATIENT RISK ASSESSMENT BE USED TO DETERMINE  APPROPRIATE ANTICOAGULANT THERAPY.   . Prothrombin Time 03/16/2015 14.3  11.6 - 15.2 seconds Final  . INR 03/16/2015 1.09  0.00 - 1.49 Final  . WBC 03/16/2015 8.4  4.0 - 10.5 K/uL Final  . RBC 03/16/2015 4.37  3.87 - 5.11 MIL/uL Final  . Hemoglobin 03/16/2015 13.5  12.0 - 15.0 g/dL Final  . HCT 08/33/2738 41.0  36.0 - 46.0 % Final  . MCV 03/16/2015 93.8  78.0 - 100.0 fL Final  . MCH 03/16/2015 30.9  26.0 - 34.0 pg Final  . MCHC 03/16/2015 32.9  30.0 - 36.0 g/dL Final  . RDW 09/47/0110 13.1  11.5 - 15.5 % Final  . Platelets 03/16/2015 275  150 - 400 K/uL Final  . Neutrophils Relative % 03/16/2015 68   Final  . Neutro Abs 03/16/2015 5.7  1.7 - 7.7 K/uL Final  . Lymphocytes Relative 03/16/2015 21   Final  . Lymphs Abs 03/16/2015 1.8  0.7 - 4.0 K/uL Final  . Monocytes Relative 03/16/2015 9   Final  . Monocytes Absolute 03/16/2015 0.8  0.1 - 1.0 K/uL Final  . Eosinophils Relative 03/16/2015 2   Final  . Eosinophils Absolute 03/16/2015 0.2  0.0 - 0.7 K/uL Final  . Basophils Relative 03/16/2015 0   Final  . Basophils Absolute 03/16/2015 0.0  0.0 - 0.1 K/uL Final  . Color, Urine 03/16/2015 YELLOW  YELLOW Final  . APPearance 03/16/2015 CLEAR  CLEAR Final  . Specific Gravity, Urine 03/16/2015 1.013  1.005 - 1.030 Final  . pH 03/16/2015 7.0  5.0 - 8.0 Final  . Glucose, UA 03/16/2015 NEGATIVE  NEGATIVE mg/dL Final  . Hgb  urine dipstick 03/16/2015 NEGATIVE  NEGATIVE Final  . Bilirubin Urine 03/16/2015 NEGATIVE  NEGATIVE Final  . Ketones, ur 03/16/2015 NEGATIVE  NEGATIVE mg/dL Final  . Protein, ur 03/16/2015 NEGATIVE  NEGATIVE mg/dL Final  . Nitrite 03/16/2015 NEGATIVE  NEGATIVE Final  . Leukocytes, UA 03/16/2015 NEGATIVE  NEGATIVE Final   MICROSCOPIC NOT DONE ON URINES WITH NEGATIVE PROTEIN, BLOOD, LEUKOCYTES, NITRITE, OR GLUCOSE <1000 mg/dL.  . Lipase 03/16/2015 38  11 - 51 U/L Final  . MRSA, PCR 03/18/2015 NEGATIVE  NEGATIVE Final  . Staphylococcus aureus 03/18/2015 NEGATIVE  NEGATIVE  Final   Comment:        The Xpert SA Assay (FDA approved for NASAL specimens in patients over 34 years of age), is one component of a comprehensive surveillance program.  Test performance has been validated by [facility] for patients greater than or equal to 26 year old. It is not intended to diagnose infection nor to guide or monitor treatment.   . Sodium 03/20/2015 133* 135 - 145 mmol/L Final  . Potassium 03/20/2015 4.3  3.5 - 5.1 mmol/L Final  . Chloride 03/20/2015 100* 101 - 111 mmol/L Final  . CO2 03/20/2015 26  22 - 32 mmol/L Final  . Glucose, Bld 03/20/2015 135* 65 - 99 mg/dL Final  . BUN 03/20/2015 11  6 - 20 mg/dL Final  . Creatinine, Ser 03/20/2015 0.66  0.44 - 1.00 mg/dL Final  . Calcium 03/20/2015 8.8* 8.9 - 10.3 mg/dL Final  . Total Protein 03/20/2015 5.3* 6.5 - 8.1 g/dL Final  . Albumin 03/20/2015 2.8* 3.5 - 5.0 g/dL Final  . AST 03/20/2015 43* 15 - 41 U/L Final  . ALT 03/20/2015 35  14 - 54 U/L Final  . Alkaline Phosphatase 03/20/2015 58  38 - 126 U/L Final  . Total Bilirubin 03/20/2015 0.6  0.3 - 1.2 mg/dL Final  . GFR calc non Af Amer 03/20/2015 >60  >60 mL/min Final  . GFR calc Af Amer 03/20/2015 >60  >60 mL/min Final   Comment: (NOTE) The eGFR has been calculated using the CKD EPI equation. This calculation has not been validated in all clinical situations. eGFR's persistently <60 mL/min signify possible Chronic Kidney Disease.   . Anion gap 03/20/2015 7  5 - 15 Final  . WBC 03/20/2015 10.3  4.0 - 10.5 K/uL Final  . RBC 03/20/2015 3.84* 3.87 - 5.11 MIL/uL Final  . Hemoglobin 03/20/2015 12.1  12.0 - 15.0 g/dL Final  . HCT 03/20/2015 36.6  36.0 - 46.0 % Final  . MCV 03/20/2015 95.3  78.0 - 100.0 fL Final  . MCH 03/20/2015 31.5  26.0 - 34.0 pg Final  . MCHC 03/20/2015 33.1  30.0 - 36.0 g/dL Final  . RDW 03/20/2015 13.5  11.5 - 15.5 % Final  . Platelets 03/20/2015 201  150 - 400 K/uL Final  . Sodium 03/21/2015 135  135 - 145 mmol/L Final  . Potassium  03/21/2015 4.2  3.5 - 5.1 mmol/L Final  . Chloride 03/21/2015 99* 101 - 111 mmol/L Final  . CO2 03/21/2015 28  22 - 32 mmol/L Final  . Glucose, Bld 03/21/2015 160* 65 - 99 mg/dL Final  . BUN 03/21/2015 10  6 - 20 mg/dL Final  . Creatinine, Ser 03/21/2015 0.70  0.44 - 1.00 mg/dL Final  . Calcium 03/21/2015 8.4* 8.9 - 10.3 mg/dL Final  . GFR calc non Af Amer 03/21/2015 >60  >60 mL/min Final  . GFR calc Af Amer 03/21/2015 >60  >60 mL/min Final  Comment: (NOTE) The eGFR has been calculated using the CKD EPI equation. This calculation has not been validated in all clinical situations. eGFR's persistently <60 mL/min signify possible Chronic Kidney Disease.   . Anion gap 03/21/2015 8  5 - 15 Final  . WBC 03/21/2015 9.0  4.0 - 10.5 K/uL Final  . RBC 03/21/2015 3.62* 3.87 - 5.11 MIL/uL Final  . Hemoglobin 03/21/2015 11.3* 12.0 - 15.0 g/dL Final  . HCT 03/21/2015 34.4* 36.0 - 46.0 % Final  . MCV 03/21/2015 95.0  78.0 - 100.0 fL Final  . MCH 03/21/2015 31.2  26.0 - 34.0 pg Final  . MCHC 03/21/2015 32.8  30.0 - 36.0 g/dL Final  . RDW 03/21/2015 13.3  11.5 - 15.5 % Final  . Platelets 03/21/2015 181  150 - 400 K/uL Final  . WBC 03/22/2015 8.3  4.0 - 10.5 K/uL Final  . RBC 03/22/2015 3.98  3.87 - 5.11 MIL/uL Final  . Hemoglobin 03/22/2015 12.3  12.0 - 15.0 g/dL Final  . HCT 03/22/2015 37.7  36.0 - 46.0 % Final  . MCV 03/22/2015 94.7  78.0 - 100.0 fL Final  . MCH 03/22/2015 30.9  26.0 - 34.0 pg Final  . MCHC 03/22/2015 32.6  30.0 - 36.0 g/dL Final  . RDW 03/22/2015 13.2  11.5 - 15.5 % Final  . Platelets 03/22/2015 222  150 - 400 K/uL Final  . Sodium 03/22/2015 134* 135 - 145 mmol/L Final  . Potassium 03/22/2015 4.2  3.5 - 5.1 mmol/L Final  . Chloride 03/22/2015 95* 101 - 111 mmol/L Final  . CO2 03/22/2015 29  22 - 32 mmol/L Final  . Glucose, Bld 03/22/2015 169* 65 - 99 mg/dL Final  . BUN 03/22/2015 20  6 - 20 mg/dL Final  . Creatinine, Ser 03/22/2015 0.78  0.44 - 1.00 mg/dL Final  . Calcium  03/22/2015 9.0  8.9 - 10.3 mg/dL Final  . GFR calc non Af Amer 03/22/2015 >60  >60 mL/min Final  . GFR calc Af Amer 03/22/2015 >60  >60 mL/min Final   Comment: (NOTE) The eGFR has been calculated using the CKD EPI equation. This calculation has not been validated in all clinical situations. eGFR's persistently <60 mL/min signify possible Chronic Kidney Disease.   . Anion gap 03/22/2015 10  5 - 15 Final  Admission on 03/09/2015, Discharged on 03/09/2015  Component Date Value Ref Range Status  . Lipase 03/09/2015 37  11 - 51 U/L Final  . Sodium 03/09/2015 130* 135 - 145 mmol/L Final  . Potassium 03/09/2015 4.8  3.5 - 5.1 mmol/L Final  . Chloride 03/09/2015 98* 101 - 111 mmol/L Final  . CO2 03/09/2015 24  22 - 32 mmol/L Final  . Glucose, Bld 03/09/2015 113* 65 - 99 mg/dL Final  . BUN 03/09/2015 19  6 - 20 mg/dL Final  . Creatinine, Ser 03/09/2015 0.81  0.44 - 1.00 mg/dL Final  . Calcium 03/09/2015 9.1  8.9 - 10.3 mg/dL Final  . Total Protein 03/09/2015 6.1* 6.5 - 8.1 g/dL Final  . Albumin 03/09/2015 3.6  3.5 - 5.0 g/dL Final  . AST 03/09/2015 17  15 - 41 U/L Final  . ALT 03/09/2015 13* 14 - 54 U/L Final  . Alkaline Phosphatase 03/09/2015 55  38 - 126 U/L Final  . Total Bilirubin 03/09/2015 0.6  0.3 - 1.2 mg/dL Final  . GFR calc non Af Amer 03/09/2015 59* >60 mL/min Final  . GFR calc Af Amer 03/09/2015 >60  >60 mL/min Final  Comment: (NOTE) The eGFR has been calculated using the CKD EPI equation. This calculation has not been validated in all clinical situations. eGFR's persistently <60 mL/min signify possible Chronic Kidney Disease.   . Anion gap 03/09/2015 8  5 - 15 Final  . WBC 03/09/2015 8.2  4.0 - 10.5 K/uL Final  . RBC 03/09/2015 3.92  3.87 - 5.11 MIL/uL Final  . Hemoglobin 03/09/2015 12.1  12.0 - 15.0 g/dL Final  . HCT 03/09/2015 36.3  36.0 - 46.0 % Final  . MCV 03/09/2015 92.6  78.0 - 100.0 fL Final  . MCH 03/09/2015 30.9  26.0 - 34.0 pg Final  . MCHC 03/09/2015 33.3   30.0 - 36.0 g/dL Final  . RDW 03/09/2015 13.3  11.5 - 15.5 % Final  . Platelets 03/09/2015 271  150 - 400 K/uL Final  . Color, Urine 03/09/2015 YELLOW  YELLOW Final  . APPearance 03/09/2015 HAZY* CLEAR Final  . Specific Gravity, Urine 03/09/2015 1.017  1.005 - 1.030 Final  . pH 03/09/2015 7.0  5.0 - 8.0 Final  . Glucose, UA 03/09/2015 NEGATIVE  NEGATIVE mg/dL Final  . Hgb urine dipstick 03/09/2015 NEGATIVE  NEGATIVE Final  . Bilirubin Urine 03/09/2015 NEGATIVE  NEGATIVE Final  . Ketones, ur 03/09/2015 NEGATIVE  NEGATIVE mg/dL Final  . Protein, ur 03/09/2015 NEGATIVE  NEGATIVE mg/dL Final  . Nitrite 03/09/2015 NEGATIVE  NEGATIVE Final  . Leukocytes, UA 03/09/2015 NEGATIVE  NEGATIVE Final   MICROSCOPIC NOT DONE ON URINES WITH NEGATIVE PROTEIN, BLOOD, LEUKOCYTES, NITRITE, OR GLUCOSE <1000 mg/dL.  Admission on 03/01/2015, Discharged on 03/01/2015  Component Date Value Ref Range Status  . Lipase 03/01/2015 37  11 - 51 U/L Final  . Sodium 03/01/2015 128* 135 - 145 mmol/L Final  . Potassium 03/01/2015 4.7  3.5 - 5.1 mmol/L Final  . Chloride 03/01/2015 96* 101 - 111 mmol/L Final  . CO2 03/01/2015 23  22 - 32 mmol/L Final  . Glucose, Bld 03/01/2015 136* 65 - 99 mg/dL Final  . BUN 03/01/2015 18  6 - 20 mg/dL Final  . Creatinine, Ser 03/01/2015 0.83  0.44 - 1.00 mg/dL Final  . Calcium 03/01/2015 9.1  8.9 - 10.3 mg/dL Final  . Total Protein 03/01/2015 6.3* 6.5 - 8.1 g/dL Final  . Albumin 03/01/2015 3.8  3.5 - 5.0 g/dL Final  . AST 03/01/2015 19  15 - 41 U/L Final  . ALT 03/01/2015 17  14 - 54 U/L Final  . Alkaline Phosphatase 03/01/2015 55  38 - 126 U/L Final  . Total Bilirubin 03/01/2015 0.5  0.3 - 1.2 mg/dL Final  . GFR calc non Af Amer 03/01/2015 57* >60 mL/min Final  . GFR calc Af Amer 03/01/2015 >60  >60 mL/min Final   Comment: (NOTE) The eGFR has been calculated using the CKD EPI equation. This calculation has not been validated in all clinical situations. eGFR's persistently <60  mL/min signify possible Chronic Kidney Disease.   . Anion gap 03/01/2015 9  5 - 15 Final  . WBC 03/01/2015 10.1  4.0 - 10.5 K/uL Final  . RBC 03/01/2015 4.14  3.87 - 5.11 MIL/uL Final  . Hemoglobin 03/01/2015 12.8  12.0 - 15.0 g/dL Final  . HCT 03/01/2015 38.6  36.0 - 46.0 % Final  . MCV 03/01/2015 93.2  78.0 - 100.0 fL Final  . MCH 03/01/2015 30.9  26.0 - 34.0 pg Final  . MCHC 03/01/2015 33.2  30.0 - 36.0 g/dL Final  . RDW 03/01/2015 13.2  11.5 - 15.5 %  Final  . Platelets 03/01/2015 290  150 - 400 K/uL Final  . Color, Urine 03/01/2015 YELLOW  YELLOW Final  . APPearance 03/01/2015 CLEAR  CLEAR Final  . Specific Gravity, Urine 03/01/2015 1.009  1.005 - 1.030 Final  . pH 03/01/2015 7.5  5.0 - 8.0 Final  . Glucose, UA 03/01/2015 NEGATIVE  NEGATIVE mg/dL Final  . Hgb urine dipstick 03/01/2015 NEGATIVE  NEGATIVE Final  . Bilirubin Urine 03/01/2015 NEGATIVE  NEGATIVE Final  . Ketones, ur 03/01/2015 NEGATIVE  NEGATIVE mg/dL Final  . Protein, ur 03/01/2015 NEGATIVE  NEGATIVE mg/dL Final  . Nitrite 03/01/2015 NEGATIVE  NEGATIVE Final  . Leukocytes, UA 03/01/2015 TRACE* NEGATIVE Final  . Troponin i, poc 03/01/2015 0.00  0.00 - 0.08 ng/mL Final  . Comment 3 03/01/2015          Final   Comment: Due to the release kinetics of cTnI, a negative result within the first hours of the onset of symptoms does not rule out myocardial infarction with certainty. If myocardial infarction is still suspected, repeat the test at appropriate intervals.   . Squamous Epithelial / LPF 03/01/2015 0-5* NONE SEEN Final  . WBC, UA 03/01/2015 6-30  0 - 5 WBC/hpf Final  . RBC / HPF 03/01/2015 0-5  0 - 5 RBC/hpf Final  . Bacteria, UA 03/01/2015 FEW* NONE SEEN Final  . Casts 03/01/2015 HYALINE CASTS* NEGATIVE Final  . Specimen Description 03/01/2015 URINE, RANDOM   Final  . Special Requests 03/01/2015 NONE   Final  . Culture 03/01/2015 MULTIPLE SPECIES PRESENT, SUGGEST RECOLLECTION   Final  . Report Status  03/01/2015 03/02/2015 FINAL   Final  Appointment on 02/13/2015  Component Date Value Ref Range Status  . Vitamin B-12 02/13/2015 1439* 211 - 946 pg/mL Final  . Folate 02/13/2015 >20.0  >3.0 ng/mL Final   Comment: A serum folate concentration of less than 3.1 ng/mL is considered to represent clinical deficiency.   Marland Kitchen TSH 02/13/2015 1.290  0.450 - 4.500 uIU/mL Final  . Hgb A1c MFr Bld 02/13/2015 6.2* 4.8 - 5.6 % Final   Comment:          Pre-diabetes: 5.7 - 6.4          Diabetes: >6.4          Glycemic control for adults with diabetes: <7.0   . Est. average glucose Bld gHb Est-m* 02/13/2015 131   Final  Office Visit on 02/08/2015  Component Date Value Ref Range Status  . Glucose 02/08/2015 113* 65 - 99 mg/dL Final  . BUN 02/08/2015 21  10 - 36 mg/dL Final  . Creatinine, Ser 02/08/2015 0.79  0.57 - 1.00 mg/dL Final  . GFR calc non Af Amer 02/08/2015 63  >59 mL/min/1.73 Final  . GFR calc Af Amer 02/08/2015 73  >59 mL/min/1.73 Final  . BUN/Creatinine Ratio 02/08/2015 27* 11 - 26 Final  . Sodium 02/08/2015 128* 136 - 144 mmol/L Final  . Potassium 02/08/2015 5.4* 3.5 - 5.2 mmol/L Final  . Chloride 02/08/2015 91* 97 - 106 mmol/L Final  . CO2 02/08/2015 21  18 - 29 mmol/L Final  . Calcium 02/08/2015 9.5  8.7 - 10.3 mg/dL Final  . WBC 02/08/2015 8.9  3.4 - 10.8 x10E3/uL Final  . RBC 02/08/2015 4.25  3.77 - 5.28 x10E6/uL Final  . Hemoglobin 02/08/2015 13.5  11.1 - 15.9 g/dL Final  . Hematocrit 02/08/2015 38.5  34.0 - 46.6 % Final  . MCV 02/08/2015 91  79 - 97 fL Final  .  MCH 02/08/2015 31.8  26.6 - 33.0 pg Final  . MCHC 02/08/2015 35.1  31.5 - 35.7 g/dL Final  . RDW 02/08/2015 13.0  12.3 - 15.4 % Final  . Platelets 02/08/2015 297  150 - 379 x10E3/uL Final  . Neutrophils 02/08/2015 72   Final  . Lymphs 02/08/2015 17   Final  . Monocytes 02/08/2015 10   Final  . Eos 02/08/2015 1   Final  . Basos 02/08/2015 0   Final  . Neutrophils Absolute 02/08/2015 6.4  1.4 - 7.0 x10E3/uL Final  .  Lymphocytes Absolute 02/08/2015 1.5  0.7 - 3.1 x10E3/uL Final  . Monocytes Absolute 02/08/2015 0.9  0.1 - 0.9 x10E3/uL Final  . EOS (ABSOLUTE) 02/08/2015 0.1  0.0 - 0.4 x10E3/uL Final  . Basophils Absolute 02/08/2015 0.0  0.0 - 0.2 x10E3/uL Final  . Immature Granulocytes 02/08/2015 0   Final  . Immature Grans (Abs) 02/08/2015 0.0  0.0 - 0.1 x10E3/uL Final  Admission on 02/03/2015, Discharged on 02/03/2015  Component Date Value Ref Range Status  . Sodium 02/03/2015 126* 135 - 145 mmol/L Final  . Potassium 02/03/2015 4.7  3.5 - 5.1 mmol/L Final  . Chloride 02/03/2015 95* 101 - 111 mmol/L Final  . CO2 02/03/2015 24  22 - 32 mmol/L Final  . Glucose, Bld 02/03/2015 123* 65 - 99 mg/dL Final  . BUN 02/03/2015 17  6 - 20 mg/dL Final  . Creatinine, Ser 02/03/2015 0.78  0.44 - 1.00 mg/dL Final  . Calcium 02/03/2015 9.0  8.9 - 10.3 mg/dL Final  . GFR calc non Af Amer 02/03/2015 >60  >60 mL/min Final  . GFR calc Af Amer 02/03/2015 >60  >60 mL/min Final   Comment: (NOTE) The eGFR has been calculated using the CKD EPI equation. This calculation has not been validated in all clinical situations. eGFR's persistently <60 mL/min signify possible Chronic Kidney Disease.   . Anion gap 02/03/2015 7  5 - 15 Final  . WBC 02/03/2015 11.0* 4.0 - 10.5 K/uL Final  . RBC 02/03/2015 4.23  3.87 - 5.11 MIL/uL Final  . Hemoglobin 02/03/2015 13.4  12.0 - 15.0 g/dL Final  . HCT 02/03/2015 39.0  36.0 - 46.0 % Final  . MCV 02/03/2015 92.2  78.0 - 100.0 fL Final  . MCH 02/03/2015 31.7  26.0 - 34.0 pg Final  . MCHC 02/03/2015 34.4  30.0 - 36.0 g/dL Final  . RDW 02/03/2015 13.2  11.5 - 15.5 % Final  . Platelets 02/03/2015 243  150 - 400 K/uL Final  . Troponin i, poc 02/03/2015 0.01  0.00 - 0.08 ng/mL Final  . Comment 3 02/03/2015          Final   Comment: Due to the release kinetics of cTnI, a negative result within the first hours of the onset of symptoms does not rule out myocardial infarction with certainty. If  myocardial infarction is still suspected, repeat the test at appropriate intervals.   . Troponin i, poc 02/03/2015 0.01  0.00 - 0.08 ng/mL Final  . Comment 3 02/03/2015          Final   Comment: Due to the release kinetics of cTnI, a negative result within the first hours of the onset of symptoms does not rule out myocardial infarction with certainty. If myocardial infarction is still suspected, repeat the test at appropriate intervals.   Office Visit on 12/29/2014  Component Date Value Ref Range Status  . Color, UA 12/29/2014 dark yellow   Final  .  Clarity, UA 12/29/2014 little cloudy   Final  . Glucose, UA 12/29/2014 negative   Final  . Bilirubin, UA 12/29/2014 small   Final  . Ketones, UA 12/29/2014 trace   Final  . Spec Grav, UA 12/29/2014 1.010   Final  . Blood, UA 12/29/2014 negative   Final  . pH, UA 12/29/2014 5.0   Final  . Protein, UA 12/29/2014 30   Final  . Urobilinogen, UA 12/29/2014 negative   Final  . Nitrite, UA 12/29/2014 nigative   Final  . Leukocytes, UA 12/29/2014 moderate (2+)* Negative Final    Dg Chest 1 View  03/23/2015  CLINICAL DATA:  Left pleural effusion.  Post left thoracentesis EXAM: CHEST 1 VIEW COMPARISON:  03/22/2015 FINDINGS: Decreasing left pleural effusion. Probable small left pleural effusion remains. No pneumothorax following thoracentesis. Linear areas of scarring or atelectasis in the lungs bilaterally. Heart is borderline in size. IMPRESSION: Significant decrease in left pleural effusion following thoracentesis. No pneumothorax. Electronically Signed   By: Rolm Baptise M.D.   On: 03/23/2015 09:59   Dg Chest 2 View  03/22/2015  CLINICAL DATA:  Shortness of Breath EXAM: CHEST  2 VIEW COMPARISON:  March 20, 2015 FINDINGS: There is a persistent sizable left pleural effusion with left lower lobe atelectasis. There is perihilar interstitial edema bilaterally. There is cardiomegaly with pulmonary venous hypertension. No apparent adenopathy.  IMPRESSION: Evidence of congestive heart failure. Persistent sizable left effusion. Electronically Signed   By: Lowella Grip III M.D.   On: 03/22/2015 07:51   Dg Chest 2 View  03/16/2015  CLINICAL DATA:  Pleural effusions EXAM: CHEST  2 VIEW COMPARISON:  03/09/2015 FINDINGS: Moderate left pleural effusion. Left lower lobe atelectasis or infiltrate. Heart is borderline in size. No confluent opacity on the right. No acute bony abnormality. IMPRESSION: Moderate left pleural effusion. Left lower lobe atelectasis or infiltrate. Electronically Signed   By: Rolm Baptise M.D.   On: 03/16/2015 14:35   Dg Cholangiogram Operative  03/19/2015  CLINICAL DATA:  81 year old female with cholecystitis EXAM: INTRAOPERATIVE CHOLANGIOGRAM TECHNIQUE: Cholangiographic images from the C-arm fluoroscopic device were submitted for interpretation post-operatively. Please see the procedural report for the amount of contrast and the fluoroscopy time utilized. COMPARISON:  Abdominal ultrasound 03/09/2015 FINDINGS: Cine images and multiple spot radiographs obtained during intraoperative cholangiogram at the time of laparoscopic cholecystectomy. The images demonstrate cannulation of the cystic duct remanent and opacification of the biliary tree. No evidence of biliary ductal dilatation, stenosis or stricture. No choledocholithiasis. There are a few mobile filling defects consistent with injected bubbles. Contrast material passes freely through the ampulla and into the duodenum. IMPRESSION: Negative intraoperative cholangiogram. Electronically Signed   By: Jacqulynn Cadet M.D.   On: 03/19/2015 13:52   Ct Abdomen Pelvis W Contrast  03/01/2015  CLINICAL DATA:  Abdominal pain. ED Notes: Pt here with abd pain for several weeks, RUQ pain when she turns on her right side. Denies pain at current. Has no appetite. EXAM: CT ABDOMEN AND PELVIS WITH CONTRAST TECHNIQUE: Multidetector CT imaging of the abdomen and pelvis was performed using  the standard protocol following bolus administration of intravenous contrast. CONTRAST:  125m OMNIPAQUE IOHEXOL 300 MG/ML  SOLN COMPARISON:  PET-CT 01/06/2013 comet CT of the chest on 01/18/2014 FINDINGS: Lower chest: Chronic changes are identified at the right lung base. There is a new left-sided pleural effusion and associated left basilar atelectasis. Significant coronary artery and mitral calcification noted. Upper abdomen: Gallbladder is distended and contains sludge and stones.  No focal abnormality identified within the liver, spleen, or adrenal glands. Pancreas has a normal appearance. There is symmetric enhancement and excretion of both kidneys. Small cysts are identified in the upper pole regions of both kidneys. Gastrointestinal tract: The stomach and small bowel loops are normal in appearance. The appendix is well seen and has a normal appearance. There are numerous colonic diverticula. No acute diverticulitis. Pelvis: Small amount of air is identified within the urinary bladder possibly following recent catheterization. Status post hysterectomy. No adnexal mass. Retroperitoneum: There is dense atherosclerotic calcification of the abdominal aorta. Infrarenal aorta measures 3.0 cm. No evidence for dissection. No retroperitoneal or mesenteric adenopathy. Abdominal wall: Unremarkable. Osseous structures: Right hip arthroplasty. Moderate degenerative changes in the lumbar spine. No suspicious lytic or blastic lesions are identified. IMPRESSION: 1. Interval development of left-sided pleural effusion associated with left basilar atelectasis. 2. Coronary artery disease and mitral calcification. 3. Sludge and stones within the gallbladder. Consider further evaluation with abdominal ultrasound. 4. Small renal cysts. 5. Normal appendix. 6. Colonic diverticulosis. 7. Abdominal aortic aneurysm. Recommend followup by ultrasound in 3 years. This recommendation follows ACR consensus guidelines: White Paper of the ACR  Incidental Findings Committee II on Vascular Findings. Natasha Mead Coll Radiol 2013; 10:[id number] Electronically Signed   By: Nolon Nations M.D.   On: 03/01/2015 16:08   Dg Chest Port 1 View  03/20/2015  CLINICAL DATA:  Wheezing and cough since this morning, gallbladder surgery yesterday, history hypertension, hiatal hernia EXAM: PORTABLE CHEST 1 VIEW COMPARISON:  Portable exam 1756 hours compared 03/16/2015 FINDINGS: Upper normal heart size. Stable mediastinal contours and pulmonary vascularity. Moderate LEFT pleural effusion increased since previous exam. Increased LEFT basilar atelectasis. Persistent bronchitic changes. RIGHT lung clear. No pneumothorax. Bones demineralized with posttraumatic deformity of the proximal RIGHT humerus. IMPRESSION: Increased LEFT pleural effusion and basilar atelectasis since previous exam. Electronically Signed   By: Lavonia Dana M.D.   On: 03/20/2015 18:09   Dg Abd Acute W/chest  03/09/2015  CLINICAL DATA:  Abdominal pain.  Left lung mass. EXAM: DG ABDOMEN ACUTE W/ 1V CHEST COMPARISON:  Chest radiograph on 02/03/2015 FINDINGS: The bowel gas pattern is normal. No evidence of dilated bowel loops or free intraperitoneal air. Left apical lung mass shows no significant change compared to recent exam. A small to moderate left pleural effusion is seen on the upright view which appears increased. Heart size is within normal limits. IMPRESSION: Normal bowel gas pattern. Left apical lung mass, without significant change compared to recent study. Small to moderate left pleural effusion. Electronically Signed   By: Earle Gell M.D.   On: 03/09/2015 12:15   US Abdomen Limited Ruq  03/09/2015  CLINICAL DATA:  Abdominal pain.History of gallstones, with increasing pain. EXAM: US ABDOMEN LIMITED - RIGHT UPPER QUADRANT COMPARISON:  CT body 03/01/2015. FINDINGS: Gallbladder: Multiple layering gallstones. Gallbladder wall thickness 1.6 mm. Negative sonographic Murphy's sign. Common bile duct:  Diameter: Within normal limits, measuring 4.5 mm. Liver: No focal hepatic abnormalities. IMPRESSION: Cholelithiasis without signs of acute cholecystitis. Electronically Signed   By: Staci Righter M.D.   On: 03/09/2015 15:03   US Abdomen Limited Ruq  03/01/2015  CLINICAL DATA:  Patient with right upper quadrant abdominal pain. EXAM: US ABDOMEN LIMITED - RIGHT UPPER QUADRANT COMPARISON:  CT 03/01/2015. FINDINGS: Gallbladder: Multiple mobile echogenic stones are demonstrated within the gallbladder lumen. No gallbladder wall thickening. No pericholecystic fluid. Negative sonographic Murphy sign. Common bile duct: Diameter: 5.5 mm Liver: No focal lesion identified. Within  normal limits in parenchymal echogenicity. IMPRESSION: Cholelithiasis without sonographic evidence for acute cholecystitis. Electronically Signed   By: Lovey Newcomer M.D.   On: 03/01/2015 17:45   US Thoracentesis Asp Pleural Space W/img Guide  03/23/2015  CLINICAL DATA:  CHF, left pleural effusion, shortness of breath EXAM: ULTRASOUND GUIDED LEFT THORACENTESIS COMPARISON:  03/23/2015 PROCEDURE: An ultrasound guided thoracentesis was thoroughly discussed with the patient and questions answered. The benefits, risks, alternatives and complications were also discussed. The patient understands and wishes to proceed with the procedure. Written consent was obtained. Ultrasound was performed to localize and mark an adequate pocket of fluid in the left chest. The area was then prepped and draped in the normal sterile fashion. 1% Lidocaine was used for local anesthesia. Under ultrasound guidance a safety centesis needle catheter was introduced. Thoracentesis was performed. The catheter was removed and a dressing applied. Complications:  None immediate FINDINGS: A total of approximately 1 L of serosanguineous amber pleural fluid was removed. A fluid sample wassent for laboratory analysis. IMPRESSION: Successful ultrasound guided left thoracentesis yielding 1  L of pleural fluid. Electronically Signed   By: Jerilynn Mages.  Shick M.D.   On: 03/23/2015 09:46     Assessment/Plan  VS qshift x 1 week  Hold losartan if SBP <100  Cont other meds as ordered  F/u with specialists as scheduled  May bring yogurt from home  Nutritional supplement per facility protocol at least 60 cc TID. Nutrition eval  GOAL: short term rehab and d/c home when medically appropriate. Communicated with pt and nursing.  Will follow  Finnlee Guarnieri S. Perlie Gold  [facility] [address] [place], [place] [address] [phone] Cell (Monday-Friday 8 AM - 5 PM) [phone] After 5 PM and follow prompts

## 2015-04-01 ENCOUNTER — Other Ambulatory Visit: Payer: Self-pay | Admitting: Internal Medicine

## 2015-04-01 LAB — BASIC METABOLIC PANEL
BUN: 17 mg/dL (ref 4–21)
Creatinine: 0.9 mg/dL (ref 0.5–1.1)
GLUCOSE: 137 mg/dL
Potassium: 4.8 mmol/L (ref 3.4–5.3)
Sodium: 133 mmol/L — AB (ref 137–147)

## 2015-04-01 LAB — CBC AND DIFFERENTIAL
HEMATOCRIT: 40 % (ref 36–46)
HEMOGLOBIN: 13.2 g/dL (ref 12.0–16.0)
Platelets: 247 10*3/uL (ref 150–399)
WBC: 13.8 10*3/mL

## 2015-04-01 LAB — HEPATIC FUNCTION PANEL
ALT: 11 U/L (ref 7–35)
AST: 11 U/L — AB (ref 13–35)
Alkaline Phosphatase: 53 U/L (ref 25–125)
BILIRUBIN, TOTAL: 0.3 mg/dL

## 2015-04-05 ENCOUNTER — Encounter: Payer: Self-pay | Admitting: Internal Medicine

## 2015-04-05 ENCOUNTER — Ambulatory Visit: Admit: 2015-04-05 | Payer: BC Managed Care – PPO | Admitting: General Surgery

## 2015-04-05 ENCOUNTER — Non-Acute Institutional Stay (SKILLED_NURSING_FACILITY): Payer: Medicare Other | Admitting: Internal Medicine

## 2015-04-05 DIAGNOSIS — K5900 Constipation, unspecified: Secondary | ICD-10-CM | POA: Diagnosis not present

## 2015-04-05 DIAGNOSIS — J301 Allergic rhinitis due to pollen: Secondary | ICD-10-CM

## 2015-04-05 DIAGNOSIS — G894 Chronic pain syndrome: Secondary | ICD-10-CM

## 2015-04-05 DIAGNOSIS — K5909 Other constipation: Secondary | ICD-10-CM

## 2015-04-05 DIAGNOSIS — G609 Hereditary and idiopathic neuropathy, unspecified: Secondary | ICD-10-CM

## 2015-04-05 SURGERY — LAPAROSCOPIC CHOLECYSTECTOMY WITH INTRAOPERATIVE CHOLANGIOGRAM
Anesthesia: General

## 2015-04-05 NOTE — Progress Notes (Signed)
Patient ID: [name], female   DOB: Jan 04, 1916, 82 y.o.   MRN: [id number]    DATE:04/05/15  Location:  [facility]    Place of Service: [phone])   Extended Emergency Contact Information Primary Emergency Contact: Mousel,Clyde Address: [place], [place] [address] Johnnette Litter of [place] Phone: [phone] Mobile Phone: [phone] Relation: Son Secondary Emergency Contact: Julieta Gutting [place] of [place] Phone: [phone] Mobile Phone: [phone] Relation: Daughter  Advanced Directive information  DNR  Chief Complaint  Patient presents with  . Acute Visit    os red; constipation    HPI:  82 yoshort term stay female seen today for constipation and eval of red OS. She reports redness in OS x few days with crusting only in AM. No f/c, sinus pressure, HA. She does not take antihistamine. She continues to have issues with constipation despite taking colace and miralax. No bloody stools. No abdominal pain. She takes prn percocet for chronic pain in joints and also takes lyrica for neuroapthy. Son is present. Unable to obtain further hx due to she is very HOH. Hx obtained from son.  Past Medical History  Diagnosis Date  . History of hiatal hernia   . Mitral valve prolapse   . Plantar fasciitis   . Hay fever   . GERD (gastroesophageal reflux disease)   . Palpitations   . Neuromuscular disorder ([place])   . Deafness     "very HOH; even w/hearing aides we have to communicate via writing things down for her to read" (08/25/2012)  . H/O hiatal hernia   . Arthritis     "a little all over" (08/25/2012)  . Lung mass     Hx: of  . Anxiety   . Hemorrhoids   . Neuropathy ([place])   . Hypertension   . Hyperglycemia     Past Surgical History  Procedure Laterality Date  . Foot surgery Bilateral ~ 2000    "pinched nerve on feet" (08/25/2012)  . Vesicovaginal fistula closure w/ tah    . Abdominal hysterectomy   1998  . Tonsillectomy and adenoidectomy  1940's  . Cesarean section  1952  . Knee arthroscopy w/ meniscectomy Left 07/15/2010    partial medial and lateral meniscectomies.Archie Endo 07/15/2010 (08/25/2012)  . Hip arthroplasty Right 08/26/2012    Procedure: ARTHROPLASTY BIPOLAR HIP;  Surgeon: Mauri Pole, MD;  Location: [place];  Service: Orthopedics;  Laterality: Right;  . Open reduction internal fixation (orif) distal radial fracture Right 09/11/2012    Procedure: OPEN REDUCTION INTERNAL FIXATION (ORIF) RIGHT DISTAL RADIAL FRACTURE/ULNAR POSSIBLE BONE GRAFTING;  Surgeon: Linna Hoff, MD;  Location: [place];  Service: Orthopedics;  Laterality: Right;  . Cataract extraction, bilateral Bilateral   . Laparoscopic cholecystectomy  03/19/2015  . Cholecystectomy N/A 03/19/2015    Procedure: LAPAROSCOPIC CHOLECYSTECTOMY WITH INTRAOPERATIVE CHOLANGIOGRAM;  Surgeon: Donnie Mesa, MD;  Location: [place];  Service: General;  Laterality: N/A;    Patient Care Team: Gayland Curry, DO as PCP - General (Geriatric Medicine) Anastasio Auerbach, MD as Consulting Physician (Gynecology)  Social History   Social History  . Marital Status: Widowed    Spouse Name: N/A  . Number of Children: N/A  . Years of Education: N/A   Occupational History  . Not on file.   Social History Main Topics  . Smoking status: Never Smoker   . Smokeless tobacco: Never Used  . Alcohol  Use: No  . Drug Use: No  . Sexual Activity: No   Other Topics Concern  . Not on file   Social History Narrative   ** Merged History Encounter **       Married 36 years - widowed 1996   1 son, 1 daughter 2 grandsons   Lives in her own home, son lives with her. Tries to walk every day. Starting to get out more after ortho injury. I- ADLS.   End-of-life issues. DNR and no heroic or extraordinary measures.   Walks with cane     reports that she has never smoked. She has never used smokeless tobacco. She reports that she does not drink alcohol  or use illicit drugs.  Immunization History  Administered Date(s) Administered  . Influenza Split 03/04/2011  . Pneumococcal Conjugate-13 04/28/2014  . Pneumococcal Polysaccharide-23 07/08/2006  . Tdap 11/08/2013  . Zoster 03/03/2011    Allergies  Allergen Reactions  . Statins Other (See Comments)    myopathy  . Aleve [Naproxen Sodium] Swelling    Legs and feet    Medications: Patient's Medications  New Prescriptions   No medications on file  Previous Medications   ACETAMINOPHEN (TYLENOL) 325 MG TABLET    Take 1-2 tablets (325-650 mg total) by mouth every 4 (four) hours as needed for mild pain.   DOCUSATE SODIUM (COLACE) 100 MG CAPSULE    Take 1 capsule (100 mg total) by mouth 2 (two) times daily as needed for mild constipation.   FEEDING SUPPLEMENT (BOOST / RESOURCE BREEZE) LIQD    Take 1 Container by mouth 3 (three) times daily between meals.   FUROSEMIDE (LASIX) 20 MG TABLET    Take 20 mg by mouth daily.   KLOR-CON M20 20 MEQ TABLET    Take 40 mEq by mouth daily.   LOSARTAN (COZAAR) 50 MG TABLET    TAKE 1 TABLET (50 MG TOTAL) BY MOUTH DAILY.   METOPROLOL SUCCINATE (TOPROL-XL) 100 MG 24 HR TABLET    TAKE ONE TABLET BY MOUTH ONCE DAILY.   METRONIDAZOLE (METROCREAM) 0.75 % CREAM    Apply 1 application topically daily.   MULTIPLE VITAMIN (MULTIVITAMIN WITH MINERALS) TABS TABLET    Take 1 tablet by mouth daily.   OXYCODONE-ACETAMINOPHEN (PERCOCET/ROXICET) 5-325 MG TABLET    Take 1 tablet by mouth every 6 (six) hours as needed for moderate pain.   POLYETHYLENE GLYCOL (MIRALAX / GLYCOLAX) PACKET    Take 17 g by mouth 2 (two) times daily.   PREGABALIN (LYRICA) 75 MG CAPSULE    Take 1 capsule (75 mg total) by mouth 3 (three) times daily.   PROBIOTIC PRODUCT (PROBIOTIC DAILY PO)    Take 1 tablet by mouth daily.   SERTRALINE (ZOLOFT) 100 MG TABLET    Take 1 tablet (100 mg total) by mouth at bedtime.  Modified Medications   No medications on file  Discontinued Medications   No  medications on file    Review of Systems  Unable to perform ROS: Other  due to very HOH  Filed Vitals:   04/05/15 1709  BP: 128/70  Pulse: 60  Temp: 97.6 F (36.4 C)  Weight: 164 lb 9.6 oz (74.662 kg)   Body mass index is 26.58 kg/(m^2).  Physical Exam  Constitutional: She appears well-developed.  Frail appearing in NAD. Very HOH.   Eyes: Pupils are equal, round, and reactive to light. No scleral icterus.  No corneal redness. No d/c. Infraorbital swelling b/l. No orbital TTP. No sinus  TTP.  Neurological: She is alert.  Skin: Skin is warm and dry. No rash noted.  Psychiatric: She has a normal mood and affect. Her behavior is normal.     Labs reviewed: Admission on 03/16/2015, Discharged on 03/27/2015  Component Date Value Ref Range Status  . Sodium 03/16/2015 131* 135 - 145 mmol/L Final  . Potassium 03/16/2015 4.2  3.5 - 5.1 mmol/L Final  . Chloride 03/16/2015 97* 101 - 111 mmol/L Final  . CO2 03/16/2015 24  22 - 32 mmol/L Final  . Glucose, Bld 03/16/2015 117* 65 - 99 mg/dL Final  . BUN 03/16/2015 19  6 - 20 mg/dL Final  . Creatinine, Ser 03/16/2015 0.75  0.44 - 1.00 mg/dL Final  . Calcium 03/16/2015 9.3  8.9 - 10.3 mg/dL Final  . Total Protein 03/16/2015 6.3* 6.5 - 8.1 g/dL Final  . Albumin 03/16/2015 3.8  3.5 - 5.0 g/dL Final  . AST 03/16/2015 18  15 - 41 U/L Final  . ALT 03/16/2015 16  14 - 54 U/L Final  . Alkaline Phosphatase 03/16/2015 63  38 - 126 U/L Final  . Total Bilirubin 03/16/2015 0.8  0.3 - 1.2 mg/dL Final  . GFR calc non Af Amer 03/16/2015 >60  >60 mL/min Final  . GFR calc Af Amer 03/16/2015 >60  >60 mL/min Final   Comment: (NOTE) The eGFR has been calculated using the CKD EPI equation. This calculation has not been validated in all clinical situations. eGFR's persistently <60 mL/min signify possible Chronic Kidney Disease.   . Anion gap 03/16/2015 10  5 - 15 Final  . aPTT 03/16/2015 38* 24 - 37 seconds Final   Comment:        IF BASELINE aPTT IS  ELEVATED, SUGGEST PATIENT RISK ASSESSMENT BE USED TO DETERMINE APPROPRIATE ANTICOAGULANT THERAPY.   . Prothrombin Time 03/16/2015 14.3  11.6 - 15.2 seconds Final  . INR 03/16/2015 1.09  0.00 - 1.49 Final  . WBC 03/16/2015 8.4  4.0 - 10.5 K/uL Final  . RBC 03/16/2015 4.37  3.87 - 5.11 MIL/uL Final  . Hemoglobin 03/16/2015 13.5  12.0 - 15.0 g/dL Final  . HCT 03/16/2015 41.0  36.0 - 46.0 % Final  . MCV 03/16/2015 93.8  78.0 - 100.0 fL Final  . MCH 03/16/2015 30.9  26.0 - 34.0 pg Final  . MCHC 03/16/2015 32.9  30.0 - 36.0 g/dL Final  . RDW 03/16/2015 13.1  11.5 - 15.5 % Final  . Platelets 03/16/2015 275  150 - 400 K/uL Final  . Neutrophils Relative % 03/16/2015 68   Final  . Neutro Abs 03/16/2015 5.7  1.7 - 7.7 K/uL Final  . Lymphocytes Relative 03/16/2015 21   Final  . Lymphs Abs 03/16/2015 1.8  0.7 - 4.0 K/uL Final  . Monocytes Relative 03/16/2015 9   Final  . Monocytes Absolute 03/16/2015 0.8  0.1 - 1.0 K/uL Final  . Eosinophils Relative 03/16/2015 2   Final  . Eosinophils Absolute 03/16/2015 0.2  0.0 - 0.7 K/uL Final  . Basophils Relative 03/16/2015 0   Final  . Basophils Absolute 03/16/2015 0.0  0.0 - 0.1 K/uL Final  . Color, Urine 03/16/2015 YELLOW  YELLOW Final  . APPearance 03/16/2015 CLEAR  CLEAR Final  . Specific Gravity, Urine 03/16/2015 1.013  1.005 - 1.030 Final  . pH 03/16/2015 7.0  5.0 - 8.0 Final  . Glucose, UA 03/16/2015 NEGATIVE  NEGATIVE mg/dL Final  . Hgb urine dipstick 03/16/2015 NEGATIVE  NEGATIVE Final  . Bilirubin  Urine 03/16/2015 NEGATIVE  NEGATIVE Final  . Ketones, ur 03/16/2015 NEGATIVE  NEGATIVE mg/dL Final  . Protein, ur 03/16/2015 NEGATIVE  NEGATIVE mg/dL Final  . Nitrite 03/16/2015 NEGATIVE  NEGATIVE Final  . Leukocytes, UA 03/16/2015 NEGATIVE  NEGATIVE Final   MICROSCOPIC NOT DONE ON URINES WITH NEGATIVE PROTEIN, BLOOD, LEUKOCYTES, NITRITE, OR GLUCOSE <1000 mg/dL.  . Lipase 03/16/2015 38  11 - 51 U/L Final  . MRSA, PCR 03/18/2015 NEGATIVE  NEGATIVE  Final  . Staphylococcus aureus 03/18/2015 NEGATIVE  NEGATIVE Final   Comment:        The Xpert SA Assay (FDA approved for NASAL specimens in patients over 71 years of age), is one component of a comprehensive surveillance program.  Test performance has been validated by [facility] for patients greater than or equal to 35 year old. It is not intended to diagnose infection nor to guide or monitor treatment.   . Sodium 03/20/2015 133* 135 - 145 mmol/L Final  . Potassium 03/20/2015 4.3  3.5 - 5.1 mmol/L Final  . Chloride 03/20/2015 100* 101 - 111 mmol/L Final  . CO2 03/20/2015 26  22 - 32 mmol/L Final  . Glucose, Bld 03/20/2015 135* 65 - 99 mg/dL Final  . BUN 03/20/2015 11  6 - 20 mg/dL Final  . Creatinine, Ser 03/20/2015 0.66  0.44 - 1.00 mg/dL Final  . Calcium 03/20/2015 8.8* 8.9 - 10.3 mg/dL Final  . Total Protein 03/20/2015 5.3* 6.5 - 8.1 g/dL Final  . Albumin 03/20/2015 2.8* 3.5 - 5.0 g/dL Final  . AST 03/20/2015 43* 15 - 41 U/L Final  . ALT 03/20/2015 35  14 - 54 U/L Final  . Alkaline Phosphatase 03/20/2015 58  38 - 126 U/L Final  . Total Bilirubin 03/20/2015 0.6  0.3 - 1.2 mg/dL Final  . GFR calc non Af Amer 03/20/2015 >60  >60 mL/min Final  . GFR calc Af Amer 03/20/2015 >60  >60 mL/min Final   Comment: (NOTE) The eGFR has been calculated using the CKD EPI equation. This calculation has not been validated in all clinical situations. eGFR's persistently <60 mL/min signify possible Chronic Kidney Disease.   . Anion gap 03/20/2015 7  5 - 15 Final  . WBC 03/20/2015 10.3  4.0 - 10.5 K/uL Final  . RBC 03/20/2015 3.84* 3.87 - 5.11 MIL/uL Final  . Hemoglobin 03/20/2015 12.1  12.0 - 15.0 g/dL Final  . HCT 03/20/2015 36.6  36.0 - 46.0 % Final  . MCV 03/20/2015 95.3  78.0 - 100.0 fL Final  . MCH 03/20/2015 31.5  26.0 - 34.0 pg Final  . MCHC 03/20/2015 33.1  30.0 - 36.0 g/dL Final  . RDW 03/20/2015 13.5  11.5 - 15.5 % Final  . Platelets 03/20/2015 201  150 - 400 K/uL Final  .  Sodium 03/21/2015 135  135 - 145 mmol/L Final  . Potassium 03/21/2015 4.2  3.5 - 5.1 mmol/L Final  . Chloride 03/21/2015 99* 101 - 111 mmol/L Final  . CO2 03/21/2015 28  22 - 32 mmol/L Final  . Glucose, Bld 03/21/2015 160* 65 - 99 mg/dL Final  . BUN 03/21/2015 10  6 - 20 mg/dL Final  . Creatinine, Ser 03/21/2015 0.70  0.44 - 1.00 mg/dL Final  . Calcium 03/21/2015 8.4* 8.9 - 10.3 mg/dL Final  . GFR calc non Af Amer 03/21/2015 >60  >60 mL/min Final  . GFR calc Af Amer 03/21/2015 >60  >60 mL/min Final   Comment: (NOTE) The eGFR has been calculated using  the CKD EPI equation. This calculation has not been validated in all clinical situations. eGFR's persistently <60 mL/min signify possible Chronic Kidney Disease.   . Anion gap 03/21/2015 8  5 - 15 Final  . WBC 03/21/2015 9.0  4.0 - 10.5 K/uL Final  . RBC 03/21/2015 3.62* 3.87 - 5.11 MIL/uL Final  . Hemoglobin 03/21/2015 11.3* 12.0 - 15.0 g/dL Final  . HCT 03/21/2015 34.4* 36.0 - 46.0 % Final  . MCV 03/21/2015 95.0  78.0 - 100.0 fL Final  . MCH 03/21/2015 31.2  26.0 - 34.0 pg Final  . MCHC 03/21/2015 32.8  30.0 - 36.0 g/dL Final  . RDW 03/21/2015 13.3  11.5 - 15.5 % Final  . Platelets 03/21/2015 181  150 - 400 K/uL Final  . WBC 03/22/2015 8.3  4.0 - 10.5 K/uL Final  . RBC 03/22/2015 3.98  3.87 - 5.11 MIL/uL Final  . Hemoglobin 03/22/2015 12.3  12.0 - 15.0 g/dL Final  . HCT 03/22/2015 37.7  36.0 - 46.0 % Final  . MCV 03/22/2015 94.7  78.0 - 100.0 fL Final  . MCH 03/22/2015 30.9  26.0 - 34.0 pg Final  . MCHC 03/22/2015 32.6  30.0 - 36.0 g/dL Final  . RDW 03/22/2015 13.2  11.5 - 15.5 % Final  . Platelets 03/22/2015 222  150 - 400 K/uL Final  . Sodium 03/22/2015 134* 135 - 145 mmol/L Final  . Potassium 03/22/2015 4.2  3.5 - 5.1 mmol/L Final  . Chloride 03/22/2015 95* 101 - 111 mmol/L Final  . CO2 03/22/2015 29  22 - 32 mmol/L Final  . Glucose, Bld 03/22/2015 169* 65 - 99 mg/dL Final  . BUN 03/22/2015 20  6 - 20 mg/dL Final  .  Creatinine, Ser 03/22/2015 0.78  0.44 - 1.00 mg/dL Final  . Calcium 03/22/2015 9.0  8.9 - 10.3 mg/dL Final  . GFR calc non Af Amer 03/22/2015 >60  >60 mL/min Final  . GFR calc Af Amer 03/22/2015 >60  >60 mL/min Final   Comment: (NOTE) The eGFR has been calculated using the CKD EPI equation. This calculation has not been validated in all clinical situations. eGFR's persistently <60 mL/min signify possible Chronic Kidney Disease.   . Anion gap 03/22/2015 10  5 - 15 Final  Admission on 03/09/2015, Discharged on 03/09/2015  Component Date Value Ref Range Status  . Lipase 03/09/2015 37  11 - 51 U/L Final  . Sodium 03/09/2015 130* 135 - 145 mmol/L Final  . Potassium 03/09/2015 4.8  3.5 - 5.1 mmol/L Final  . Chloride 03/09/2015 98* 101 - 111 mmol/L Final  . CO2 03/09/2015 24  22 - 32 mmol/L Final  . Glucose, Bld 03/09/2015 113* 65 - 99 mg/dL Final  . BUN 03/09/2015 19  6 - 20 mg/dL Final  . Creatinine, Ser 03/09/2015 0.81  0.44 - 1.00 mg/dL Final  . Calcium 03/09/2015 9.1  8.9 - 10.3 mg/dL Final  . Total Protein 03/09/2015 6.1* 6.5 - 8.1 g/dL Final  . Albumin 03/09/2015 3.6  3.5 - 5.0 g/dL Final  . AST 03/09/2015 17  15 - 41 U/L Final  . ALT 03/09/2015 13* 14 - 54 U/L Final  . Alkaline Phosphatase 03/09/2015 55  38 - 126 U/L Final  . Total Bilirubin 03/09/2015 0.6  0.3 - 1.2 mg/dL Final  . GFR calc non Af Amer 03/09/2015 59* >60 mL/min Final  . GFR calc Af Amer 03/09/2015 >60  >60 mL/min Final   Comment: (NOTE) The eGFR has been calculated  using the CKD EPI equation. This calculation has not been validated in all clinical situations. eGFR's persistently <60 mL/min signify possible Chronic Kidney Disease.   . Anion gap 03/09/2015 8  5 - 15 Final  . WBC 03/09/2015 8.2  4.0 - 10.5 K/uL Final  . RBC 03/09/2015 3.92  3.87 - 5.11 MIL/uL Final  . Hemoglobin 03/09/2015 12.1  12.0 - 15.0 g/dL Final  . HCT 03/09/2015 36.3  36.0 - 46.0 % Final  . MCV 03/09/2015 92.6  78.0 - 100.0 fL Final  .  MCH 03/09/2015 30.9  26.0 - 34.0 pg Final  . MCHC 03/09/2015 33.3  30.0 - 36.0 g/dL Final  . RDW 03/09/2015 13.3  11.5 - 15.5 % Final  . Platelets 03/09/2015 271  150 - 400 K/uL Final  . Color, Urine 03/09/2015 YELLOW  YELLOW Final  . APPearance 03/09/2015 HAZY* CLEAR Final  . Specific Gravity, Urine 03/09/2015 1.017  1.005 - 1.030 Final  . pH 03/09/2015 7.0  5.0 - 8.0 Final  . Glucose, UA 03/09/2015 NEGATIVE  NEGATIVE mg/dL Final  . Hgb urine dipstick 03/09/2015 NEGATIVE  NEGATIVE Final  . Bilirubin Urine 03/09/2015 NEGATIVE  NEGATIVE Final  . Ketones, ur 03/09/2015 NEGATIVE  NEGATIVE mg/dL Final  . Protein, ur 03/09/2015 NEGATIVE  NEGATIVE mg/dL Final  . Nitrite 03/09/2015 NEGATIVE  NEGATIVE Final  . Leukocytes, UA 03/09/2015 NEGATIVE  NEGATIVE Final   MICROSCOPIC NOT DONE ON URINES WITH NEGATIVE PROTEIN, BLOOD, LEUKOCYTES, NITRITE, OR GLUCOSE <1000 mg/dL.  Admission on 03/01/2015, Discharged on 03/01/2015  Component Date Value Ref Range Status  . Lipase 03/01/2015 37  11 - 51 U/L Final  . Sodium 03/01/2015 128* 135 - 145 mmol/L Final  . Potassium 03/01/2015 4.7  3.5 - 5.1 mmol/L Final  . Chloride 03/01/2015 96* 101 - 111 mmol/L Final  . CO2 03/01/2015 23  22 - 32 mmol/L Final  . Glucose, Bld 03/01/2015 136* 65 - 99 mg/dL Final  . BUN 03/01/2015 18  6 - 20 mg/dL Final  . Creatinine, Ser 03/01/2015 0.83  0.44 - 1.00 mg/dL Final  . Calcium 03/01/2015 9.1  8.9 - 10.3 mg/dL Final  . Total Protein 03/01/2015 6.3* 6.5 - 8.1 g/dL Final  . Albumin 03/01/2015 3.8  3.5 - 5.0 g/dL Final  . AST 03/01/2015 19  15 - 41 U/L Final  . ALT 03/01/2015 17  14 - 54 U/L Final  . Alkaline Phosphatase 03/01/2015 55  38 - 126 U/L Final  . Total Bilirubin 03/01/2015 0.5  0.3 - 1.2 mg/dL Final  . GFR calc non Af Amer 03/01/2015 57* >60 mL/min Final  . GFR calc Af Amer 03/01/2015 >60  >60 mL/min Final   Comment: (NOTE) The eGFR has been calculated using the CKD EPI equation. This calculation has not been  validated in all clinical situations. eGFR's persistently <60 mL/min signify possible Chronic Kidney Disease.   . Anion gap 03/01/2015 9  5 - 15 Final  . WBC 03/01/2015 10.1  4.0 - 10.5 K/uL Final  . RBC 03/01/2015 4.14  3.87 - 5.11 MIL/uL Final  . Hemoglobin 03/01/2015 12.8  12.0 - 15.0 g/dL Final  . HCT 03/01/2015 38.6  36.0 - 46.0 % Final  . MCV 03/01/2015 93.2  78.0 - 100.0 fL Final  . MCH 03/01/2015 30.9  26.0 - 34.0 pg Final  . MCHC 03/01/2015 33.2  30.0 - 36.0 g/dL Final  . RDW 03/01/2015 13.2  11.5 - 15.5 % Final  . Platelets 03/01/2015 290  150 - 400 K/uL Final  . Color, Urine 03/01/2015 YELLOW  YELLOW Final  . APPearance 03/01/2015 CLEAR  CLEAR Final  . Specific Gravity, Urine 03/01/2015 1.009  1.005 - 1.030 Final  . pH 03/01/2015 7.5  5.0 - 8.0 Final  . Glucose, UA 03/01/2015 NEGATIVE  NEGATIVE mg/dL Final  . Hgb urine dipstick 03/01/2015 NEGATIVE  NEGATIVE Final  . Bilirubin Urine 03/01/2015 NEGATIVE  NEGATIVE Final  . Ketones, ur 03/01/2015 NEGATIVE  NEGATIVE mg/dL Final  . Protein, ur 03/01/2015 NEGATIVE  NEGATIVE mg/dL Final  . Nitrite 03/01/2015 NEGATIVE  NEGATIVE Final  . Leukocytes, UA 03/01/2015 TRACE* NEGATIVE Final  . Troponin i, poc 03/01/2015 0.00  0.00 - 0.08 ng/mL Final  . Comment 3 03/01/2015          Final   Comment: Due to the release kinetics of cTnI, a negative result within the first hours of the onset of symptoms does not rule out myocardial infarction with certainty. If myocardial infarction is still suspected, repeat the test at appropriate intervals.   . Squamous Epithelial / LPF 03/01/2015 0-5* NONE SEEN Final  . WBC, UA 03/01/2015 6-30  0 - 5 WBC/hpf Final  . RBC / HPF 03/01/2015 0-5  0 - 5 RBC/hpf Final  . Bacteria, UA 03/01/2015 FEW* NONE SEEN Final  . Casts 03/01/2015 HYALINE CASTS* NEGATIVE Final  . Specimen Description 03/01/2015 URINE, RANDOM   Final  . Special Requests 03/01/2015 NONE   Final  . Culture 03/01/2015 MULTIPLE SPECIES  PRESENT, SUGGEST RECOLLECTION   Final  . Report Status 03/01/2015 03/02/2015 FINAL   Final  Appointment on 02/13/2015  Component Date Value Ref Range Status  . Vitamin B-12 02/13/2015 1439* 211 - 946 pg/mL Final  . Folate 02/13/2015 >20.0  >3.0 ng/mL Final   Comment: A serum folate concentration of less than 3.1 ng/mL is considered to represent clinical deficiency.   Marland Kitchen TSH 02/13/2015 1.290  0.450 - 4.500 uIU/mL Final  . Hgb A1c MFr Bld 02/13/2015 6.2* 4.8 - 5.6 % Final   Comment:          Pre-diabetes: 5.7 - 6.4          Diabetes: >6.4          Glycemic control for adults with diabetes: <7.0   . Est. average glucose Bld gHb Est-m* 02/13/2015 131   Final  Office Visit on 02/08/2015  Component Date Value Ref Range Status  . Glucose 02/08/2015 113* 65 - 99 mg/dL Final  . BUN 02/08/2015 21  10 - 36 mg/dL Final  . Creatinine, Ser 02/08/2015 0.79  0.57 - 1.00 mg/dL Final  . GFR calc non Af Amer 02/08/2015 63  >59 mL/min/1.73 Final  . GFR calc Af Amer 02/08/2015 73  >59 mL/min/1.73 Final  . BUN/Creatinine Ratio 02/08/2015 27* 11 - 26 Final  . Sodium 02/08/2015 128* 136 - 144 mmol/L Final  . Potassium 02/08/2015 5.4* 3.5 - 5.2 mmol/L Final  . Chloride 02/08/2015 91* 97 - 106 mmol/L Final  . CO2 02/08/2015 21  18 - 29 mmol/L Final  . Calcium 02/08/2015 9.5  8.7 - 10.3 mg/dL Final  . WBC 02/08/2015 8.9  3.4 - 10.8 x10E3/uL Final  . RBC 02/08/2015 4.25  3.77 - 5.28 x10E6/uL Final  . Hemoglobin 02/08/2015 13.5  11.1 - 15.9 g/dL Final  . Hematocrit 02/08/2015 38.5  34.0 - 46.6 % Final  . MCV 02/08/2015 91  79 - 97 fL Final  . MCH 02/08/2015 31.8  26.6 -  33.0 pg Final  . MCHC 02/08/2015 35.1  31.5 - 35.7 g/dL Final  . RDW 02/08/2015 13.0  12.3 - 15.4 % Final  . Platelets 02/08/2015 297  150 - 379 x10E3/uL Final  . Neutrophils 02/08/2015 72   Final  . Lymphs 02/08/2015 17   Final  . Monocytes 02/08/2015 10   Final  . Eos 02/08/2015 1   Final  . Basos 02/08/2015 0   Final  . Neutrophils  Absolute 02/08/2015 6.4  1.4 - 7.0 x10E3/uL Final  . Lymphocytes Absolute 02/08/2015 1.5  0.7 - 3.1 x10E3/uL Final  . Monocytes Absolute 02/08/2015 0.9  0.1 - 0.9 x10E3/uL Final  . EOS (ABSOLUTE) 02/08/2015 0.1  0.0 - 0.4 x10E3/uL Final  . Basophils Absolute 02/08/2015 0.0  0.0 - 0.2 x10E3/uL Final  . Immature Granulocytes 02/08/2015 0   Final  . Immature Grans (Abs) 02/08/2015 0.0  0.0 - 0.1 x10E3/uL Final  Admission on 02/03/2015, Discharged on 02/03/2015  Component Date Value Ref Range Status  . Sodium 02/03/2015 126* 135 - 145 mmol/L Final  . Potassium 02/03/2015 4.7  3.5 - 5.1 mmol/L Final  . Chloride 02/03/2015 95* 101 - 111 mmol/L Final  . CO2 02/03/2015 24  22 - 32 mmol/L Final  . Glucose, Bld 02/03/2015 123* 65 - 99 mg/dL Final  . BUN 02/03/2015 17  6 - 20 mg/dL Final  . Creatinine, Ser 02/03/2015 0.78  0.44 - 1.00 mg/dL Final  . Calcium 02/03/2015 9.0  8.9 - 10.3 mg/dL Final  . GFR calc non Af Amer 02/03/2015 >60  >60 mL/min Final  . GFR calc Af Amer 02/03/2015 >60  >60 mL/min Final   Comment: (NOTE) The eGFR has been calculated using the CKD EPI equation. This calculation has not been validated in all clinical situations. eGFR's persistently <60 mL/min signify possible Chronic Kidney Disease.   . Anion gap 02/03/2015 7  5 - 15 Final  . WBC 02/03/2015 11.0* 4.0 - 10.5 K/uL Final  . RBC 02/03/2015 4.23  3.87 - 5.11 MIL/uL Final  . Hemoglobin 02/03/2015 13.4  12.0 - 15.0 g/dL Final  . HCT 02/03/2015 39.0  36.0 - 46.0 % Final  . MCV 02/03/2015 92.2  78.0 - 100.0 fL Final  . MCH 02/03/2015 31.7  26.0 - 34.0 pg Final  . MCHC 02/03/2015 34.4  30.0 - 36.0 g/dL Final  . RDW 02/03/2015 13.2  11.5 - 15.5 % Final  . Platelets 02/03/2015 243  150 - 400 K/uL Final  . Troponin i, poc 02/03/2015 0.01  0.00 - 0.08 ng/mL Final  . Comment 3 02/03/2015          Final   Comment: Due to the release kinetics of cTnI, a negative result within the first hours of the onset of symptoms does  not rule out myocardial infarction with certainty. If myocardial infarction is still suspected, repeat the test at appropriate intervals.   . Troponin i, poc 02/03/2015 0.01  0.00 - 0.08 ng/mL Final  . Comment 3 02/03/2015          Final   Comment: Due to the release kinetics of cTnI, a negative result within the first hours of the onset of symptoms does not rule out myocardial infarction with certainty. If myocardial infarction is still suspected, repeat the test at appropriate intervals.     Dg Chest 1 View  03/23/2015  CLINICAL DATA:  Left pleural effusion.  Post left thoracentesis EXAM: CHEST 1 VIEW COMPARISON:  03/22/2015 FINDINGS: Decreasing  left pleural effusion. Probable small left pleural effusion remains. No pneumothorax following thoracentesis. Linear areas of scarring or atelectasis in the lungs bilaterally. Heart is borderline in size. IMPRESSION: Significant decrease in left pleural effusion following thoracentesis. No pneumothorax. Electronically Signed   By: Rolm Baptise M.D.   On: 03/23/2015 09:59      Assessment/Plan   ICD-9-CM ICD-10-CM   1. Chronic constipation 564.00 K59.00   2. 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 Hereditary and idiopathic peripheral neuropathy 356.9 G60.9   4. Seasonal allergic rhinitis due to pollen 477.0 J30.1    T/c antihistamine daily if redness returns  Add lactulose 30 cc po daily prn severe constipation  Cont other meds as ordered  Will follow  Afiya Ferrebee S. Perlie Gold  [facility] [address] [place], [place] [address] [phone] Cell (Monday-Friday 8 AM - 5 PM) [phone] After 5 PM and follow prompts

## 2015-04-09 ENCOUNTER — Other Ambulatory Visit: Payer: Self-pay | Admitting: Surgery

## 2015-04-09 DIAGNOSIS — Z9049 Acquired absence of other specified parts of digestive tract: Secondary | ICD-10-CM | POA: Insufficient documentation

## 2015-04-09 DIAGNOSIS — J90 Pleural effusion, not elsewhere classified: Secondary | ICD-10-CM

## 2015-04-09 DIAGNOSIS — I959 Hypotension, unspecified: Secondary | ICD-10-CM | POA: Insufficient documentation

## 2015-04-09 NOTE — Progress Notes (Signed)
Patient ID: [name], female   DOB: [birth year], 82 y.o.   MRN: [id number]    HISTORY AND PHYSICAL   DATE: 03/29/15  Location:  [facility]    Place of Service: SNF (31)   Extended Emergency Contact Information Primary Emergency Contact: Masci,Clyde Address: [place], [place] [address] Johnnette Litter of [place] Phone: [phone] Mobile Phone: [phone] Relation: Son Secondary Emergency Contact: Julieta Gutting [place] Phone: [phone] Mobile Phone: [phone] Relation: Daughter  Advanced Directive information  DNR  Chief Complaint  Patient presents with  . New Admit To SNF    HPI:  82 yo female seen today as a new admission into SNF following hospital stay for chronic cholecystitis with symptomatic cholelithiasis, left pleural effusion, known lung nodule, HTN, GERD, arthritis. On 12/1st, an abdominal US revealed gallstones but no GB wall thickening, CBD 5.69m. Repeat [place] 12[place]9th revealed GB wall thickening of 1.640mbut no cholecystitis. She was taken to OR for symptomatic cholelithiasis and is s/p lap chole with IOC. Post op she had SOB. CXR revealed left apical mass and left pleural effusion. She has declined w/u of lung mass in past. She did undergo left thoracentesis and 1 liter fluid removed and sent to cytology. Results pending at d/c.   Her son is present. Pt is very HOH and communicates by writing on an eraser board. She reports SOB, nausea, reduced appetite, and intermittent constipation. No pain, f/c. Son reports she has a hx intermittent low BP. She would like to bring yogurt from home in lieu of ensure/boost. No other concerns. No nursing issues  GERD/HH/constipation - stable on colace, miralax and probiotic. She eats yogurt from home. Takes no PPI  Neuropathy - stable on lyrica  HTN - stable on losartan and metoprolol succ. She reports long hx occasional low BPs. Currently  asymptomatic. Takes lasix with KCL for edema  Arthritis - pain controlled on prn percocet  Anxiety - stable on zoloft  She takes vitamins daily  Past Medical History  Diagnosis Date  . History of hiatal hernia   . Mitral valve prolapse   . Plantar fasciitis   . Hay fever   . GERD (gastroesophageal reflux disease)   . Palpitations   . Neuromuscular disorder ([facility]  . Deafness     "very HOH; even w/hearing aides we have to communicate via writing things down for her to read" (08/25/2012)  . H/O hiatal hernia   . Arthritis     "a little all over" (08/25/2012)  . Lung mass     Hx: of  . Anxiety   . Hemorrhoids   . Neuropathy ([facility]  . Hypertension   . Hyperglycemia     Past Surgical History  Procedure Laterality Date  . Foot surgery Bilateral ~ 2000    "pinched nerve on feet" (08/25/2012)  . Vesicovaginal fistula closure w/ tah    . Abdominal hysterectomy  1998  . Tonsillectomy and adenoidectomy  1940's  . Cesarean section  1952  . Knee arthroscopy w/ meniscectomy Left 07/15/2010    partial medial and lateral meniscectomies./nArchie Endo/16/2012 (08/25/2012)  . Hip arthroplasty Right 08/26/2012    Procedure: ARTHROPLASTY BIPOLAR HIP;  Surgeon: MaMauri PoleMD;  [place] [facility] Service: [place]ics;  Laterality: Right;  . Open reduction internal fixation (orif) distal radial fracture Right 09/11/2012    Procedure: OPEN REDUCTION INTERNAL FIXATION (ORIF) RIGHT  DISTAL RADIAL FRACTURE/ULNAR POSSIBLE BONE GRAFTING;  Surgeon: Linna Hoff, MD;  Location: [facility];  Service: Orthopedics;  Laterality: Right;  . Cataract extraction, bilateral Bilateral   . Laparoscopic cholecystectomy  03/19/2015  . Cholecystectomy N/A 03/19/2015    Procedure: LAPAROSCOPIC CHOLECYSTECTOMY WITH INTRAOPERATIVE CHOLANGIOGRAM;  Surgeon: Donnie Mesa, MD;  Location: [facility];  Service: General;  Laterality: N/A;    Patient Care Team: Gayland Curry, DO as PCP - General (Geriatric Medicine) Anastasio Auerbach, MD as Consulting Physician (Gynecology)  Social History   Social History  . Marital Status: Widowed    Spouse Name: N/A  . Number of Children: N/A  . Years of Education: N/A   Occupational History  . Not on file.   Social History Main Topics  . Smoking status: Never Smoker   . Smokeless tobacco: Never Used  . Alcohol Use: No  . Drug Use: No  . Sexual Activity: No   Other Topics Concern  . Not on file   Social History Narrative   ** Merged History Encounter **       Married 74 years - widowed 1996   1 son, 1 daughter 2 grandsons   Lives in her own home, son lives with her. Tries to walk every day. Starting to get out more after ortho injury. I- ADLS.   End-of-life issues. DNR and no heroic or extraordinary measures.   Walks with cane     reports that she has never smoked. She has never used smokeless tobacco. She reports that she does not drink alcohol or use illicit drugs.  Family History  Problem Relation Age of Onset  . Colon cancer Mother   . Colon cancer Sister   . Cancer Sister     Breast  . Colon cancer Brother   . Heart disease Brother     Heart Attack  . Cancer Father     Leukemia  . Pneumonia Sister    Family Status  Relation Status Death Age  . Mother Deceased   . Sister Alive   . Brother Deceased   . Father Deceased   . Daughter Alive   . Son Alive   . Maternal Grandmother Deceased   . Maternal Grandfather Deceased   . Paternal Grandmother Deceased   . Paternal Grandfather Deceased   . Brother Deceased   . Sister Deceased 66    Immunization History  Administered Date(s) Administered  . Influenza Split 03/04/2011  . Pneumococcal Conjugate-13 04/28/2014  . Pneumococcal Polysaccharide-23 07/08/2006  . Tdap 11/08/2013  . Zoster 03/03/2011    Allergies  Allergen Reactions  . Statins Other (See Comments)    myopathy  . Aleve [Naproxen Sodium] Swelling    Legs and feet    Medications: Patient's Medications  New  Prescriptions   No medications on file  Previous Medications   ACETAMINOPHEN (TYLENOL) 325 MG TABLET    Take 1-2 tablets (325-650 mg total) by mouth every 4 (four) hours as needed for mild pain.   DOCUSATE SODIUM (COLACE) 100 MG CAPSULE    Take 1 capsule (100 mg total) by mouth 2 (two) times daily as needed for mild constipation.   FEEDING SUPPLEMENT (BOOST / RESOURCE BREEZE) LIQD    Take 1 Container by mouth 3 (three) times daily between meals.   FUROSEMIDE (LASIX) 20 MG TABLET    Take 20 mg by mouth daily.   KLOR-CON M20 20 MEQ TABLET    Take 40 mEq by mouth daily.  LOSARTAN (COZAAR) 50 MG TABLET    TAKE 1 TABLET (50 MG TOTAL) BY MOUTH DAILY.   METOPROLOL SUCCINATE (TOPROL-XL) 100 MG 24 HR TABLET    TAKE ONE TABLET BY MOUTH ONCE DAILY.   METRONIDAZOLE (METROCREAM) 0.75 % CREAM    Apply 1 application topically daily.   MULTIPLE VITAMIN (MULTIVITAMIN WITH MINERALS) TABS TABLET    Take 1 tablet by mouth daily.   OXYCODONE-ACETAMINOPHEN (PERCOCET/ROXICET) 5-325 MG TABLET    Take 1 tablet by mouth every 6 (six) hours as needed for moderate pain.   POLYETHYLENE GLYCOL (MIRALAX / GLYCOLAX) PACKET    Take 17 g by mouth 2 (two) times daily.   PREGABALIN (LYRICA) 75 MG CAPSULE    Take 1 capsule (75 mg total) by mouth 3 (three) times daily.   PROBIOTIC PRODUCT (PROBIOTIC DAILY PO)    Take 1 tablet by mouth daily.   SERTRALINE (ZOLOFT) 100 MG TABLET    Take 1 tablet (100 mg total) by mouth at bedtime.  Modified Medications   No medications on file  Discontinued Medications   No medications on file    Review of Systems  Unable to perform ROS: Other  pt very HOH  Filed Vitals:   03/29/15 2332  BP: 76/47  Pulse: 83  Temp: 97.5 F (36.4 C)  SpO2: 97%   There is no weight on file to calculate BMI.  Physical Exam  Constitutional: She appears well-developed.  Sitting in w/c, looks pale and frail appearing but in NAD  HENT:  Mouth/Throat: Oropharynx is clear and moist. No oropharyngeal  exudate.  mmm  Eyes: Pupils are equal, round, and reactive to light. No scleral icterus.  Neck: Neck supple. Carotid bruit is not present. No tracheal deviation present. No thyromegaly present.  Cardiovascular: Normal rate, regular rhythm and intact distal pulses.  Exam reveals no gallop and no friction rub.   Murmur (1/6 SEM) heard. No LE edema b/l. no calf TTP.   Pulmonary/Chest: Effort normal and breath sounds normal. No stridor. No respiratory distress. She has no wheezes. She has no rales.  Left thoracic bandaid intact, no bleeding or d/c from thoracentesis insertion site  Abdominal: Soft. Bowel sounds are normal. She exhibits distension. She exhibits no mass. There is no hepatomegaly. There is no tenderness. There is no rebound and no guarding.  Steri strips intact in 4 locations. No redness or d/c. No wound dehiscience  Musculoskeletal: She exhibits edema and tenderness.  Trace b/l ankle swelling  Lymphadenopathy:    She has no cervical adenopathy.  Neurological: She is alert.  Skin: Skin is warm and dry. No rash noted.  Psychiatric: She has a normal mood and affect. Her behavior is normal. Thought content normal.     Labs reviewed: Admission on 03/16/2015, Discharged on 03/27/2015  Component Date Value Ref Range Status  . Sodium 03/16/2015 131* 135 - 145 mmol/L Final  . Potassium 03/16/2015 4.2  3.5 - 5.1 mmol/L Final  . Chloride 03/16/2015 97* 101 - 111 mmol/L Final  . CO2 03/16/2015 24  22 - 32 mmol/L Final  . Glucose, Bld 03/16/2015 117* 65 - 99 mg/dL Final  . BUN 03/16/2015 19  6 - 20 mg/dL Final  . Creatinine, Ser 03/16/2015 0.75  0.44 - 1.00 mg/dL Final  . Calcium 03/16/2015 9.3  8.9 - 10.3 mg/dL Final  . Total Protein 03/16/2015 6.3* 6.5 - 8.1 g/dL Final  . Albumin 03/16/2015 3.8  3.5 - 5.0 g/dL Final  . AST 03/16/2015 18  15 - 41 U/L Final  . ALT 03/16/2015 16  14 - 54 U/L Final  . Alkaline Phosphatase 03/16/2015 63  38 - 126 U/L Final  . Total Bilirubin  03/16/2015 0.8  0.3 - 1.2 mg/dL Final  . GFR calc non Af Amer 03/16/2015 >60  >60 mL/min Final  . GFR calc Af Amer 03/16/2015 >60  >60 mL/min Final   Comment: (NOTE) The eGFR has been calculated using the CKD EPI equation. This calculation has not been validated in all clinical situations. eGFR's persistently <60 mL/min signify possible Chronic Kidney Disease.   . Anion gap 03/16/2015 10  5 - 15 Final  . aPTT 03/16/2015 38* 24 - 37 seconds Final   Comment:        IF BASELINE aPTT IS ELEVATED, SUGGEST PATIENT RISK ASSESSMENT BE USED TO DETERMINE APPROPRIATE ANTICOAGULANT THERAPY.   . Prothrombin Time 03/16/2015 14.3  11.6 - 15.2 seconds Final  . INR 03/16/2015 1.09  0.00 - 1.49 Final  . WBC 03/16/2015 8.4  4.0 - 10.5 K/uL Final  . RBC 03/16/2015 4.37  3.87 - 5.11 MIL/uL Final  . Hemoglobin 03/16/2015 13.5  12.0 - 15.0 g/dL Final  . HCT 03/16/2015 41.0  36.0 - 46.0 % Final  . MCV 03/16/2015 93.8  78.0 - 100.0 fL Final  . MCH 03/16/2015 30.9  26.0 - 34.0 pg Final  . MCHC 03/16/2015 32.9  30.0 - 36.0 g/dL Final  . RDW 03/16/2015 13.1  11.5 - 15.5 % Final  . Platelets 03/16/2015 275  150 - 400 K/uL Final  . Neutrophils Relative % 03/16/2015 68   Final  . Neutro Abs 03/16/2015 5.7  1.7 - 7.7 K/uL Final  . Lymphocytes Relative 03/16/2015 21   Final  . Lymphs Abs 03/16/2015 1.8  0.7 - 4.0 K/uL Final  . Monocytes Relative 03/16/2015 9   Final  . Monocytes Absolute 03/16/2015 0.8  0.1 - 1.0 K/uL Final  . Eosinophils Relative 03/16/2015 2   Final  . Eosinophils Absolute 03/16/2015 0.2  0.0 - 0.7 K/uL Final  . Basophils Relative 03/16/2015 0   Final  . Basophils Absolute 03/16/2015 0.0  0.0 - 0.1 K/uL Final  . Color, Urine 03/16/2015 YELLOW  YELLOW Final  . APPearance 03/16/2015 CLEAR  CLEAR Final  . Specific Gravity, Urine 03/16/2015 1.013  1.005 - 1.030 Final  . pH 03/16/2015 7.0  5.0 - 8.0 Final  . Glucose, UA 03/16/2015 NEGATIVE  NEGATIVE mg/dL Final  . Hgb urine dipstick  03/16/2015 NEGATIVE  NEGATIVE Final  . Bilirubin Urine 03/16/2015 NEGATIVE  NEGATIVE Final  . Ketones, ur 03/16/2015 NEGATIVE  NEGATIVE mg/dL Final  . Protein, ur 03/16/2015 NEGATIVE  NEGATIVE mg/dL Final  . Nitrite 03/16/2015 NEGATIVE  NEGATIVE Final  . Leukocytes, UA 03/16/2015 NEGATIVE  NEGATIVE Final   MICROSCOPIC NOT DONE ON URINES WITH NEGATIVE PROTEIN, BLOOD, LEUKOCYTES, NITRITE, OR GLUCOSE <1000 mg/dL.  . Lipase 03/16/2015 38  11 - 51 U/L Final  . MRSA, PCR 03/18/2015 NEGATIVE  NEGATIVE Final  . Staphylococcus aureus 03/18/2015 NEGATIVE  NEGATIVE Final   Comment:        The Xpert SA Assay (FDA approved for NASAL specimens in patients over 13 years of age), is one component of a comprehensive surveillance program.  Test performance has been validated by [facility] for patients greater than or equal to 90 year old. It is not intended to diagnose infection nor to guide or monitor treatment.   . Sodium 03/20/2015 133* 135 -  145 mmol/L Final  . Potassium 03/20/2015 4.3  3.5 - 5.1 mmol/L Final  . Chloride 03/20/2015 100* 101 - 111 mmol/L Final  . CO2 03/20/2015 26  22 - 32 mmol/L Final  . Glucose, Bld 03/20/2015 135* 65 - 99 mg/dL Final  . BUN 03/20/2015 11  6 - 20 mg/dL Final  . Creatinine, Ser 03/20/2015 0.66  0.44 - 1.00 mg/dL Final  . Calcium 03/20/2015 8.8* 8.9 - 10.3 mg/dL Final  . Total Protein 03/20/2015 5.3* 6.5 - 8.1 g/dL Final  . Albumin 03/20/2015 2.8* 3.5 - 5.0 g/dL Final  . AST 03/20/2015 43* 15 - 41 U/L Final  . ALT 03/20/2015 35  14 - 54 U/L Final  . Alkaline Phosphatase 03/20/2015 58  38 - 126 U/L Final  . Total Bilirubin 03/20/2015 0.6  0.3 - 1.2 mg/dL Final  . GFR calc non Af Amer 03/20/2015 >60  >60 mL/min Final  . GFR calc Af Amer 03/20/2015 >60  >60 mL/min Final   Comment: (NOTE) The eGFR has been calculated using the CKD EPI equation. This calculation has not been validated in all clinical situations. eGFR's persistently <60 mL/min signify possible  Chronic Kidney Disease.   . Anion gap 03/20/2015 7  5 - 15 Final  . WBC 03/20/2015 10.3  4.0 - 10.5 K/uL Final  . RBC 03/20/2015 3.84* 3.87 - 5.11 MIL/uL Final  . Hemoglobin 03/20/2015 12.1  12.0 - 15.0 g/dL Final  . HCT 03/20/2015 36.6  36.0 - 46.0 % Final  . MCV 03/20/2015 95.3  78.0 - 100.0 fL Final  . MCH 03/20/2015 31.5  26.0 - 34.0 pg Final  . MCHC 03/20/2015 33.1  30.0 - 36.0 g/dL Final  . RDW 03/20/2015 13.5  11.5 - 15.5 % Final  . Platelets 03/20/2015 201  150 - 400 K/uL Final  . Sodium 03/21/2015 135  135 - 145 mmol/L Final  . Potassium 03/21/2015 4.2  3.5 - 5.1 mmol/L Final  . Chloride 03/21/2015 99* 101 - 111 mmol/L Final  . CO2 03/21/2015 28  22 - 32 mmol/L Final  . Glucose, Bld 03/21/2015 160* 65 - 99 mg/dL Final  . BUN 03/21/2015 10  6 - 20 mg/dL Final  . Creatinine, Ser 03/21/2015 0.70  0.44 - 1.00 mg/dL Final  . Calcium 03/21/2015 8.4* 8.9 - 10.3 mg/dL Final  . GFR calc non Af Amer 03/21/2015 >60  >60 mL/min Final  . GFR calc Af Amer 03/21/2015 >60  >60 mL/min Final   Comment: (NOTE) The eGFR has been calculated using the CKD EPI equation. This calculation has not been validated in all clinical situations. eGFR's persistently <60 mL/min signify possible Chronic Kidney Disease.   . Anion gap 03/21/2015 8  5 - 15 Final  . WBC 03/21/2015 9.0  4.0 - 10.5 K/uL Final  . RBC 03/21/2015 3.62* 3.87 - 5.11 MIL/uL Final  . Hemoglobin 03/21/2015 11.3* 12.0 - 15.0 g/dL Final  . HCT 03/21/2015 34.4* 36.0 - 46.0 % Final  . MCV 03/21/2015 95.0  78.0 - 100.0 fL Final  . MCH 03/21/2015 31.2  26.0 - 34.0 pg Final  . MCHC 03/21/2015 32.8  30.0 - 36.0 g/dL Final  . RDW 03/21/2015 13.3  11.5 - 15.5 % Final  . Platelets 03/21/2015 181  150 - 400 K/uL Final  . WBC 03/22/2015 8.3  4.0 - 10.5 K/uL Final  . RBC 03/22/2015 3.98  3.87 - 5.11 MIL/uL Final  . Hemoglobin 03/22/2015 12.3  12.0 - 15.0 g/dL Final  .  HCT 03/22/2015 37.7  36.0 - 46.0 % Final  . MCV 03/22/2015 94.7  78.0 -  100.0 fL Final  . MCH 03/22/2015 30.9  26.0 - 34.0 pg Final  . MCHC 03/22/2015 32.6  30.0 - 36.0 g/dL Final  . RDW 03/22/2015 13.2  11.5 - 15.5 % Final  . Platelets 03/22/2015 222  150 - 400 K/uL Final  . Sodium 03/22/2015 134* 135 - 145 mmol/L Final  . Potassium 03/22/2015 4.2  3.5 - 5.1 mmol/L Final  . Chloride 03/22/2015 95* 101 - 111 mmol/L Final  . CO2 03/22/2015 29  22 - 32 mmol/L Final  . Glucose, Bld 03/22/2015 169* 65 - 99 mg/dL Final  . BUN 03/22/2015 20  6 - 20 mg/dL Final  . Creatinine, Ser 03/22/2015 0.78  0.44 - 1.00 mg/dL Final  . Calcium 03/22/2015 9.0  8.9 - 10.3 mg/dL Final  . GFR calc non Af Amer 03/22/2015 >60  >60 mL/min Final  . GFR calc Af Amer 03/22/2015 >60  >60 mL/min Final   Comment: (NOTE) The eGFR has been calculated using the CKD EPI equation. This calculation has not been validated in all clinical situations. eGFR's persistently <60 mL/min signify possible Chronic Kidney Disease.   . Anion gap 03/22/2015 10  5 - 15 Final  Admission on 03/09/2015, Discharged on 03/09/2015  Component Date Value Ref Range Status  . Lipase 03/09/2015 37  11 - 51 U/L Final  . Sodium 03/09/2015 130* 135 - 145 mmol/L Final  . Potassium 03/09/2015 4.8  3.5 - 5.1 mmol/L Final  . Chloride 03/09/2015 98* 101 - 111 mmol/L Final  . CO2 03/09/2015 24  22 - 32 mmol/L Final  . Glucose, Bld 03/09/2015 113* 65 - 99 mg/dL Final  . BUN 03/09/2015 19  6 - 20 mg/dL Final  . Creatinine, Ser 03/09/2015 0.81  0.44 - 1.00 mg/dL Final  . Calcium 03/09/2015 9.1  8.9 - 10.3 mg/dL Final  . Total Protein 03/09/2015 6.1* 6.5 - 8.1 g/dL Final  . Albumin 03/09/2015 3.6  3.5 - 5.0 g/dL Final  . AST 03/09/2015 17  15 - 41 U/L Final  . ALT 03/09/2015 13* 14 - 54 U/L Final  . Alkaline Phosphatase 03/09/2015 55  38 - 126 U/L Final  . Total Bilirubin 03/09/2015 0.6  0.3 - 1.2 mg/dL Final  . GFR calc non Af Amer 03/09/2015 59* >60 mL/min Final  . GFR calc Af Amer 03/09/2015 >60  >60 mL/min Final    Comment: (NOTE) The eGFR has been calculated using the CKD EPI equation. This calculation has not been validated in all clinical situations. eGFR's persistently <60 mL/min signify possible Chronic Kidney Disease.   . Anion gap 03/09/2015 8  5 - 15 Final  . WBC 03/09/2015 8.2  4.0 - 10.5 K/uL Final  . RBC 03/09/2015 3.92  3.87 - 5.11 MIL/uL Final  . Hemoglobin 03/09/2015 12.1  12.0 - 15.0 g/dL Final  . HCT 03/09/2015 36.3  36.0 - 46.0 % Final  . MCV 03/09/2015 92.6  78.0 - 100.0 fL Final  . MCH 03/09/2015 30.9  26.0 - 34.0 pg Final  . MCHC 03/09/2015 33.3  30.0 - 36.0 g/dL Final  . RDW 03/09/2015 13.3  11.5 - 15.5 % Final  . Platelets 03/09/2015 271  150 - 400 K/uL Final  . Color, Urine 03/09/2015 YELLOW  YELLOW Final  . APPearance 03/09/2015 HAZY* CLEAR Final  . Specific Gravity, Urine 03/09/2015 1.017  1.005 - 1.030 Final  .  pH 03/09/2015 7.0  5.0 - 8.0 Final  . Glucose, UA 03/09/2015 NEGATIVE  NEGATIVE mg/dL Final  . Hgb urine dipstick 03/09/2015 NEGATIVE  NEGATIVE Final  . Bilirubin Urine 03/09/2015 NEGATIVE  NEGATIVE Final  . Ketones, ur 03/09/2015 NEGATIVE  NEGATIVE mg/dL Final  . Protein, ur 03/09/2015 NEGATIVE  NEGATIVE mg/dL Final  . Nitrite 03/09/2015 NEGATIVE  NEGATIVE Final  . Leukocytes, UA 03/09/2015 NEGATIVE  NEGATIVE Final   MICROSCOPIC NOT DONE ON URINES WITH NEGATIVE PROTEIN, BLOOD, LEUKOCYTES, NITRITE, OR GLUCOSE <1000 mg/dL.  Admission on 03/01/2015, Discharged on 03/01/2015  Component Date Value Ref Range Status  . Lipase 03/01/2015 37  11 - 51 U/L Final  . Sodium 03/01/2015 128* 135 - 145 mmol/L Final  . Potassium 03/01/2015 4.7  3.5 - 5.1 mmol/L Final  . Chloride 03/01/2015 96* 101 - 111 mmol/L Final  . CO2 03/01/2015 23  22 - 32 mmol/L Final  . Glucose, Bld 03/01/2015 136* 65 - 99 mg/dL Final  . BUN 03/01/2015 18  6 - 20 mg/dL Final  . Creatinine, Ser 03/01/2015 0.83  0.44 - 1.00 mg/dL Final  . Calcium 03/01/2015 9.1  8.9 - 10.3 mg/dL Final  . Total  Protein 03/01/2015 6.3* 6.5 - 8.1 g/dL Final  . Albumin 03/01/2015 3.8  3.5 - 5.0 g/dL Final  . AST 03/01/2015 19  15 - 41 U/L Final  . ALT 03/01/2015 17  14 - 54 U/L Final  . Alkaline Phosphatase 03/01/2015 55  38 - 126 U/L Final  . Total Bilirubin 03/01/2015 0.5  0.3 - 1.2 mg/dL Final  . GFR calc non Af Amer 03/01/2015 57* >60 mL/min Final  . GFR calc Af Amer 03/01/2015 >60  >60 mL/min Final   Comment: (NOTE) The eGFR has been calculated using the CKD EPI equation. This calculation has not been validated in all clinical situations. eGFR's persistently <60 mL/min signify possible Chronic Kidney Disease.   . Anion gap 03/01/2015 9  5 - 15 Final  . WBC 03/01/2015 10.1  4.0 - 10.5 K/uL Final  . RBC 03/01/2015 4.14  3.87 - 5.11 MIL/uL Final  . Hemoglobin 03/01/2015 12.8  12.0 - 15.0 g/dL Final  . HCT 03/01/2015 38.6  36.0 - 46.0 % Final  . MCV 03/01/2015 93.2  78.0 - 100.0 fL Final  . MCH 03/01/2015 30.9  26.0 - 34.0 pg Final  . MCHC 03/01/2015 33.2  30.0 - 36.0 g/dL Final  . RDW 03/01/2015 13.2  11.5 - 15.5 % Final  . Platelets 03/01/2015 290  150 - 400 K/uL Final  . Color, Urine 03/01/2015 YELLOW  YELLOW Final  . APPearance 03/01/2015 CLEAR  CLEAR Final  . Specific Gravity, Urine 03/01/2015 1.009  1.005 - 1.030 Final  . pH 03/01/2015 7.5  5.0 - 8.0 Final  . Glucose, UA 03/01/2015 NEGATIVE  NEGATIVE mg/dL Final  . Hgb urine dipstick 03/01/2015 NEGATIVE  NEGATIVE Final  . Bilirubin Urine 03/01/2015 NEGATIVE  NEGATIVE Final  . Ketones, ur 03/01/2015 NEGATIVE  NEGATIVE mg/dL Final  . Protein, ur 03/01/2015 NEGATIVE  NEGATIVE mg/dL Final  . Nitrite 03/01/2015 NEGATIVE  NEGATIVE Final  . Leukocytes, UA 03/01/2015 TRACE* NEGATIVE Final  . Troponin i, poc 03/01/2015 0.00  0.00 - 0.08 ng/mL Final  . Comment 3 03/01/2015          Final   Comment: Due to the release kinetics of cTnI, a negative result within the first hours of the onset of symptoms does not rule out myocardial infarction  with certainty. If myocardial infarction is still suspected, repeat the test at appropriate intervals.   . Squamous Epithelial / LPF 03/01/2015 0-5* NONE SEEN Final  . WBC, UA 03/01/2015 6-30  0 - 5 WBC/hpf Final  . RBC / HPF 03/01/2015 0-5  0 - 5 RBC/hpf Final  . Bacteria, UA 03/01/2015 FEW* NONE SEEN Final  . Casts 03/01/2015 HYALINE CASTS* NEGATIVE Final  . Specimen Description 03/01/2015 URINE, RANDOM   Final  . Special Requests 03/01/2015 NONE   Final  . Culture 03/01/2015 MULTIPLE SPECIES PRESENT, SUGGEST RECOLLECTION   Final  . Report Status 03/01/2015 03/02/2015 FINAL   Final  Appointment on 02/13/2015  Component Date Value Ref Range Status  . Vitamin B-12 02/13/2015 1439* 211 - 946 pg/mL Final  . Folate 02/13/2015 >20.0  >3.0 ng/mL Final   Comment: A serum folate concentration of less than 3.1 ng/mL is considered to represent clinical deficiency.   Marland Kitchen TSH 02/13/2015 1.290  0.450 - 4.500 uIU/mL Final  . Hgb A1c MFr Bld 02/13/2015 6.2* 4.8 - 5.6 % Final   Comment:          Pre-diabetes: 5.7 - 6.4          Diabetes: >6.4          Glycemic control for adults with diabetes: <7.0   . Est. average glucose Bld gHb Est-m* 02/13/2015 131   Final  Office Visit on 02/08/2015  Component Date Value Ref Range Status  . Glucose 02/08/2015 113* 65 - 99 mg/dL Final  . BUN 36/64/0177 21  10 - 36 mg/dL Final  . Creatinine, Ser 02/08/2015 0.79  0.57 - 1.00 mg/dL Final  . GFR calc non Af Amer 02/08/2015 63  >59 mL/min/1.73 Final  . GFR calc Af Amer 02/08/2015 73  >59 mL/min/1.73 Final  . BUN/Creatinine Ratio 02/08/2015 27* 11 - 26 Final  . Sodium 02/08/2015 128* 136 - 144 mmol/L Final  . Potassium 02/08/2015 5.4* 3.5 - 5.2 mmol/L Final  . Chloride 02/08/2015 91* 97 - 106 mmol/L Final  . CO2 02/08/2015 21  18 - 29 mmol/L Final  . Calcium 02/08/2015 9.5  8.7 - 10.3 mg/dL Final  . WBC 65/28/5918 8.9  3.4 - 10.8 x10E3/uL Final  . RBC 02/08/2015 4.25  3.77 - 5.28 x10E6/uL Final  . Hemoglobin  02/08/2015 13.5  11.1 - 15.9 g/dL Final  . Hematocrit 60/22/2117 38.5  34.0 - 46.6 % Final  . MCV 02/08/2015 91  79 - 97 fL Final  . MCH 02/08/2015 31.8  26.6 - 33.0 pg Final  . MCHC 02/08/2015 35.1  31.5 - 35.7 g/dL Final  . RDW 09/48/4145 13.0  12.3 - 15.4 % Final  . Platelets 02/08/2015 297  150 - 379 x10E3/uL Final  . Neutrophils 02/08/2015 72   Final  . Lymphs 02/08/2015 17   Final  . Monocytes 02/08/2015 10   Final  . Eos 02/08/2015 1   Final  . Basos 02/08/2015 0   Final  . Neutrophils Absolute 02/08/2015 6.4  1.4 - 7.0 x10E3/uL Final  . Lymphocytes Absolute 02/08/2015 1.5  0.7 - 3.1 x10E3/uL Final  . Monocytes Absolute 02/08/2015 0.9  0.1 - 0.9 x10E3/uL Final  . EOS (ABSOLUTE) 02/08/2015 0.1  0.0 - 0.4 x10E3/uL Final  . Basophils Absolute 02/08/2015 0.0  0.0 - 0.2 x10E3/uL Final  . Immature Granulocytes 02/08/2015 0   Final  . Immature Grans (Abs) 02/08/2015 0.0  0.0 - 0.1 x10E3/uL Final  Admission on 02/03/2015, Discharged on  02/03/2015  Component Date Value Ref Range Status  . Sodium 02/03/2015 126* 135 - 145 mmol/L Final  . Potassium 02/03/2015 4.7  3.5 - 5.1 mmol/L Final  . Chloride 02/03/2015 95* 101 - 111 mmol/L Final  . CO2 02/03/2015 24  22 - 32 mmol/L Final  . Glucose, Bld 02/03/2015 123* 65 - 99 mg/dL Final  . BUN 02/03/2015 17  6 - 20 mg/dL Final  . Creatinine, Ser 02/03/2015 0.78  0.44 - 1.00 mg/dL Final  . Calcium 02/03/2015 9.0  8.9 - 10.3 mg/dL Final  . GFR calc non Af Amer 02/03/2015 >60  >60 mL/min Final  . GFR calc Af Amer 02/03/2015 >60  >60 mL/min Final   Comment: (NOTE) The eGFR has been calculated using the CKD EPI equation. This calculation has not been validated in all clinical situations. eGFR's persistently <60 mL/min signify possible Chronic Kidney Disease.   . Anion gap 02/03/2015 7  5 - 15 Final  . WBC 02/03/2015 11.0* 4.0 - 10.5 K/uL Final  . RBC 02/03/2015 4.23  3.87 - 5.11 MIL/uL Final  . Hemoglobin 02/03/2015 13.4  12.0 - 15.0 g/dL  Final  . HCT 02/03/2015 39.0  36.0 - 46.0 % Final  . MCV 02/03/2015 92.2  78.0 - 100.0 fL Final  . MCH 02/03/2015 31.7  26.0 - 34.0 pg Final  . MCHC 02/03/2015 34.4  30.0 - 36.0 g/dL Final  . RDW 02/03/2015 13.2  11.5 - 15.5 % Final  . Platelets 02/03/2015 243  150 - 400 K/uL Final  . Troponin i, poc 02/03/2015 0.01  0.00 - 0.08 ng/mL Final  . Comment 3 02/03/2015          Final   Comment: Due to the release kinetics of cTnI, a negative result within the first hours of the onset of symptoms does not rule out myocardial infarction with certainty. If myocardial infarction is still suspected, repeat the test at appropriate intervals.   . Troponin i, poc 02/03/2015 0.01  0.00 - 0.08 ng/mL Final  . Comment 3 02/03/2015          Final   Comment: Due to the release kinetics of cTnI, a negative result within the first hours of the onset of symptoms does not rule out myocardial infarction with certainty. If myocardial infarction is still suspected, repeat the test at appropriate intervals.   Office Visit on 12/29/2014  Component Date Value Ref Range Status  . Color, UA 12/29/2014 dark yellow   Final  . Clarity, UA 12/29/2014 little cloudy   Final  . Glucose, UA 12/29/2014 negative   Final  . Bilirubin, UA 12/29/2014 small   Final  . Ketones, UA 12/29/2014 trace   Final  . Spec Grav, UA 12/29/2014 1.010   Final  . Blood, UA 12/29/2014 negative   Final  . pH, UA 12/29/2014 5.0   Final  . Protein, UA 12/29/2014 30   Final  . Urobilinogen, UA 12/29/2014 negative   Final  . Nitrite, UA 12/29/2014 nigative   Final  . Leukocytes, UA 12/29/2014 moderate (2+)* Negative Final    Dg Chest 1 View  03/23/2015  CLINICAL DATA:  Left pleural effusion.  Post left thoracentesis EXAM: CHEST 1 VIEW COMPARISON:  03/22/2015 FINDINGS: Decreasing left pleural effusion. Probable small left pleural effusion remains. No pneumothorax following thoracentesis. Linear areas of scarring or atelectasis in the lungs  bilaterally. Heart is borderline in size. IMPRESSION: Significant decrease in left pleural effusion following thoracentesis. No pneumothorax. Electronically Signed  By: Rolm Baptise M.D.   On: 03/23/2015 09:59   Dg Chest 2 View  03/22/2015  CLINICAL DATA:  Shortness of Breath EXAM: CHEST  2 VIEW COMPARISON:  March 20, 2015 FINDINGS: There is a persistent sizable left pleural effusion with left lower lobe atelectasis. There is perihilar interstitial edema bilaterally. There is cardiomegaly with pulmonary venous hypertension. No apparent adenopathy. IMPRESSION: Evidence of congestive heart failure. Persistent sizable left effusion. Electronically Signed   By: Lowella Grip III M.D.   On: 03/22/2015 07:51   Dg Chest 2 View  03/16/2015  CLINICAL DATA:  Pleural effusions EXAM: CHEST  2 VIEW COMPARISON:  03/09/2015 FINDINGS: Moderate left pleural effusion. Left lower lobe atelectasis or infiltrate. Heart is borderline in size. No confluent opacity on the right. No acute bony abnormality. IMPRESSION: Moderate left pleural effusion. Left lower lobe atelectasis or infiltrate. Electronically Signed   By: Rolm Baptise M.D.   On: 03/16/2015 14:35   Dg Cholangiogram Operative  03/19/2015  CLINICAL DATA:  82 year old female with cholecystitis EXAM: INTRAOPERATIVE CHOLANGIOGRAM TECHNIQUE: Cholangiographic images from the C-arm fluoroscopic device were submitted for interpretation post-operatively. Please see the procedural report for the amount of contrast and the fluoroscopy time utilized. COMPARISON:  Abdominal ultrasound 03/09/2015 FINDINGS: Cine images and multiple spot radiographs obtained during intraoperative cholangiogram at the time of laparoscopic cholecystectomy. The images demonstrate cannulation of the cystic duct remanent and opacification of the biliary tree. No evidence of biliary ductal dilatation, stenosis or stricture. No choledocholithiasis. There are a few mobile filling defects consistent  with injected bubbles. Contrast material passes freely through the ampulla and into the duodenum. IMPRESSION: Negative intraoperative cholangiogram. Electronically Signed   By: Jacqulynn Cadet M.D.   On: 03/19/2015 13:52   Ct Abdomen Pelvis W Contrast  03/01/2015  CLINICAL DATA:  Abdominal pain. ED Notes: Pt here with abd pain for several weeks, RUQ pain when she turns on her right side. Denies pain at current. Has no appetite. EXAM: CT ABDOMEN AND PELVIS WITH CONTRAST TECHNIQUE: Multidetector CT imaging of the abdomen and pelvis was performed using the standard protocol following bolus administration of intravenous contrast. CONTRAST:  18m OMNIPAQUE IOHEXOL 300 MG/ML  SOLN COMPARISON:  PET-CT 01/06/2013 comet CT of the chest on 01/18/2014 FINDINGS: Lower chest: Chronic changes are identified at the right lung base. There is a new left-sided pleural effusion and associated left basilar atelectasis. Significant coronary artery and mitral calcification noted. Upper abdomen: Gallbladder is distended and contains sludge and stones. No focal abnormality identified within the liver, spleen, or adrenal glands. Pancreas has a normal appearance. There is symmetric enhancement and excretion of both kidneys. Small cysts are identified in the upper pole regions of both kidneys. Gastrointestinal tract: The stomach and small bowel loops are normal in appearance. The appendix is well seen and has a normal appearance. There are numerous colonic diverticula. No acute diverticulitis. Pelvis: Small amount of air is identified within the urinary bladder possibly following recent catheterization. Status post hysterectomy. No adnexal mass. Retroperitoneum: There is dense atherosclerotic calcification of the abdominal aorta. Infrarenal aorta measures 3.0 cm. No evidence for dissection. No retroperitoneal or mesenteric adenopathy. Abdominal wall: Unremarkable. Osseous structures: Right hip arthroplasty. Moderate degenerative changes  in the lumbar spine. No suspicious lytic or blastic lesions are identified. IMPRESSION: 1. Interval development of left-sided pleural effusion associated with left basilar atelectasis. 2. Coronary artery disease and mitral calcification. 3. Sludge and stones within the gallbladder. Consider further evaluation with abdominal ultrasound. 4. Small renal  cysts. 5. Normal appendix. 6. Colonic diverticulosis. 7. Abdominal aortic aneurysm. Recommend followup by ultrasound in 3 years. This recommendation follows ACR consensus guidelines: White Paper of the ACR Incidental Findings Committee II on Vascular Findings. Natasha Mead Coll Radiol 2013; 10:789-794 Electronically Signed   By: Nolon Nations M.D.   On: 03/01/2015 16:08   Dg Chest Port 1 View  03/20/2015  CLINICAL DATA:  Wheezing and cough since this morning, gallbladder surgery yesterday, history hypertension, hiatal hernia EXAM: PORTABLE CHEST 1 VIEW COMPARISON:  Portable exam 1756 hours compared 03/16/2015 FINDINGS: Upper normal heart size. Stable mediastinal contours and pulmonary vascularity. Moderate LEFT pleural effusion increased since previous exam. Increased LEFT basilar atelectasis. Persistent bronchitic changes. RIGHT lung clear. No pneumothorax. Bones demineralized with posttraumatic deformity of the proximal RIGHT humerus. IMPRESSION: Increased LEFT pleural effusion and basilar atelectasis since previous exam. Electronically Signed   By: Lavonia Dana M.D.   On: 03/20/2015 18:09   Dg Abd Acute W/chest  03/09/2015  CLINICAL DATA:  Abdominal pain.  Left lung mass. EXAM: DG ABDOMEN ACUTE W/ 1V CHEST COMPARISON:  Chest radiograph on 02/03/2015 FINDINGS: The bowel gas pattern is normal. No evidence of dilated bowel loops or free intraperitoneal air. Left apical lung mass shows no significant change compared to recent exam. A small to moderate left pleural effusion is seen on the upright view which appears increased. Heart size is within normal limits.  IMPRESSION: Normal bowel gas pattern. Left apical lung mass, without significant change compared to recent study. Small to moderate left pleural effusion. Electronically Signed   By: Earle Gell M.D.   On: 03/09/2015 12:15   US Abdomen Limited Ruq  03/09/2015  CLINICAL DATA:  Abdominal pain.History of gallstones, with increasing pain. EXAM: US ABDOMEN LIMITED - RIGHT UPPER QUADRANT COMPARISON:  CT body 03/01/2015. FINDINGS: Gallbladder: Multiple layering gallstones. Gallbladder wall thickness 1.6 mm. Negative sonographic Murphy's sign. Common bile duct: Diameter: Within normal limits, measuring 4.5 mm. Liver: No focal hepatic abnormalities. IMPRESSION: Cholelithiasis without signs of acute cholecystitis. Electronically Signed   By: Staci Righter M.D.   On: 03/09/2015 15:03   US Abdomen Limited Ruq  03/01/2015  CLINICAL DATA:  Patient with right upper quadrant abdominal pain. EXAM: US ABDOMEN LIMITED - RIGHT UPPER QUADRANT COMPARISON:  CT 03/01/2015. FINDINGS: Gallbladder: Multiple mobile echogenic stones are demonstrated within the gallbladder lumen. No gallbladder wall thickening. No pericholecystic fluid. Negative sonographic Murphy sign. Common bile duct: Diameter: 5.5 mm Liver: No focal lesion identified. Within normal limits in parenchymal echogenicity. IMPRESSION: Cholelithiasis without sonographic evidence for acute cholecystitis. Electronically Signed   By: Lovey Newcomer M.D.   On: 03/01/2015 17:45   US Thoracentesis Asp Pleural Space W/img Guide  03/23/2015  CLINICAL DATA:  CHF, left pleural effusion, shortness of breath EXAM: ULTRASOUND GUIDED LEFT THORACENTESIS COMPARISON:  03/23/2015 PROCEDURE: An ultrasound guided thoracentesis was thoroughly discussed with the patient and questions answered. The benefits, risks, alternatives and complications were also discussed. The patient understands and wishes to proceed with the procedure. Written consent was obtained. Ultrasound was performed to localize  and mark an adequate pocket of fluid in the left chest. The area was then prepped and draped in the normal sterile fashion. 1% Lidocaine was used for local anesthesia. Under ultrasound guidance a safety centesis needle catheter was introduced. Thoracentesis was performed. The catheter was removed and a dressing applied. Complications:  None immediate FINDINGS: A total of approximately 1 L of serosanguineous amber pleural fluid was removed. A fluid sample  wassent for laboratory analysis. IMPRESSION: Successful ultrasound guided left thoracentesis yielding 1 L of pleural fluid. Electronically Signed   By: Jerilynn Mages.  Shick M.D.   On: 03/23/2015 09:46     Assessment/Plan   ICD-9-CM ICD-10-CM   1. Hypotension, unspecified hypotension type - known hx per family and pt; she is asymptomatic 458.9 I95.9   2. Constipation, unspecified constipation type 564.00 K59.00   3. Pleural effusion, left - s/p thoracentesis 511.9 J94.8   4. S/P laparoscopic cholecystectomy - due to chronic cholecystitis with symptomatic cholelithiasis V45.89 Z90.49   5. Essential hypertension, benign 401.1 I10   6. Pain, joint, multiple sites 719.49 M25.50   7. Hearing loss, bilateral 389.9 H91.93   8. Hereditary and idiopathic peripheral neuropathy 356.9 G60.9   9. Gastroesophageal reflux disease, esophagitis presence not specified 530.81 K21.9   10.    Generalized anxiety d/o 11.    Solitary pulmonary nodule  VS qshift x 1 week  Hold losartan if SBP <100  Follow cytology report  Cont other meds as ordered  F/u with specialists as scheduled  May bring yogurt from home  Nutritional supplement per facility protocol at least 60 cc TID. Nutrition eval  GOAL: short term rehab and d/c home when medically appropriate. Communicated with pt and nursing.  Will follow  Janet Decesare S. Perlie Gold  [facility] [address] [place], [place] [address] [phone] Cell (Monday-Friday 8 AM -  5 PM) [phone] After 5 PM and follow prompts

## 2015-04-11 ENCOUNTER — Ambulatory Visit (INDEPENDENT_AMBULATORY_CARE_PROVIDER_SITE_OTHER): Payer: Medicare Other | Admitting: Surgery

## 2015-04-11 ENCOUNTER — Encounter: Payer: Self-pay | Admitting: Surgery

## 2015-04-11 ENCOUNTER — Ambulatory Visit
Admission: RE | Admit: 2015-04-11 | Discharge: 2015-04-11 | Disposition: A | Discharge status: Home / Self Care | Payer: Medicare Other | Source: Ambulatory Visit | Attending: Surgery | Admitting: Surgery

## 2015-04-11 ENCOUNTER — Other Ambulatory Visit: Payer: Self-pay | Admitting: *Deleted

## 2015-04-11 VITALS — BP 111/67 | HR 82 | Resp 16 | Ht 68.0 in | Wt 162.0 lb

## 2015-04-11 DIAGNOSIS — R911 Solitary pulmonary nodule: Secondary | ICD-10-CM | POA: Diagnosis not present

## 2015-04-11 DIAGNOSIS — J90 Pleural effusion, not elsewhere classified: Secondary | ICD-10-CM

## 2015-04-11 DIAGNOSIS — J948 Other specified pleural conditions: Secondary | ICD-10-CM

## 2015-04-11 NOTE — Progress Notes (Signed)
HPI:  She returns today for follow up of a large left pleural effusion that was drained by thoracentesis in the hospital on 03/23/2015 after she underwent laparoscopic cholecystectomy. The cytology showed adenocarcinoma. She has had an enlarging left upper lobe lung mass that has been extending toward the pleural surface with a satellite nodule. She has refused any biopsy or therapy for that since I have been following it dating back to 12/2012. She is current at [facility] following her hospitalization and comes to the office today with her daughter and son. She is tired but denies shortness of breath. She has some left chest pain.  Current Outpatient Prescriptions  Medication Sig Dispense Refill  . acetaminophen (TYLENOL) 325 MG tablet Take 1-2 tablets (325-650 mg total) by mouth every 4 (four) hours as needed for mild pain.    Marland Kitchen docusate sodium (COLACE) 100 MG capsule Take 1 capsule (100 mg total) by mouth 2 (two) times daily as needed for mild constipation. 10 capsule 0  . feeding supplement (BOOST / RESOURCE BREEZE) LIQD Take 1 Container by mouth 3 (three) times daily between meals.  0  . furosemide (LASIX) 20 MG tablet Take 20 mg by mouth daily.  3  . KLOR-CON M20 20 MEQ tablet Take 40 mEq by mouth daily.  6  . metoprolol succinate (TOPROL-XL) 100 MG 24 hr tablet TAKE ONE TABLET BY MOUTH ONCE DAILY. (Patient taking differently: TAKE 100 MG BY MOUTH ONCE DAILY) 30 tablet 5  . metroNIDAZOLE (METROCREAM) 0.75 % cream Apply 1 application topically daily.    . Multiple Vitamin (MULTIVITAMIN WITH MINERALS) TABS tablet Take 1 tablet by mouth daily.    Marland Kitchen oxyCODONE-acetaminophen (PERCOCET/ROXICET) 5-325 MG tablet Take 1 tablet by mouth every 6 (six) hours as needed for moderate pain. 30 tablet 0  . polyethylene glycol (MIRALAX / GLYCOLAX) packet Take 17 g by mouth 2 (two) times daily. 14 each 0  . pregabalin (LYRICA) 75 MG capsule Take 1 capsule (75 mg total) by mouth 3 (three) times daily.  90 capsule 0  . Probiotic Product (PROBIOTIC DAILY PO) Take 1 tablet by mouth daily.    . sertraline (ZOLOFT) 100 MG tablet Take 1 tablet (100 mg total) by mouth at bedtime. 30 tablet 3  . losartan (COZAAR) 50 MG tablet TAKE 1 TABLET (50 MG TOTAL) BY MOUTH DAILY. 30 tablet 9   No current facility-administered medications for this visit.     Physical Exam: BP 111/67 mmHg  Pulse 82  Resp 16  Ht '5\' 8"'$  (1.727 m)  Wt 162 lb (73.483 kg)  BMI 24.64 kg/m2  SpO2 93% Elderly woman in no distress No breath sounds over LLL.    Diagnostic Tests:  CLINICAL DATA: Pleural effusion. Wheezing. Shortness of breath.  EXAM: CHEST 2 VIEW  COMPARISON: 03/23/2015.  FINDINGS: Consolidation of the lower half of the left hemi thorax is noted consistent with left lower lobe infiltrate with prominent left pleural effusion. Right lung is clear. Cardiomegaly with normal pulmonary vascularity. No acute bony abnormality.  IMPRESSION: Of the  Opacification lower half of the left hemi thorax consistent with left lower lobe consolidation and left pleural effusion .   Electronically Signed  By: Marcello Moores Register  On: 04/11/2015 12:22   Impression:  She has a recurrent left pleural effusion that is malignant with a left upper lobe lung mass that is probably adenocarcinoma. She is not a candidate for chemotherapy given her age and condition. She does not want any treatment  but is agreeable to a PleurX catheter to treat her malignant left pleural effusion. I think that is the best course of action to keep her breathing as comfortably as possible. I discussed the procedure with the patient and her family including the alternative of repeated thoracentesis, benefits and risks including but not limited to bleeding, infection, pneumothorax, incomplete drainage, catheter malfunction and the possible need for talc pleurodesis. She appears to understand and agrees to proceed.  Plan:  Left PleurX  catheter on Friday 04/13/2015   [name], MD [facility] [phone]

## 2015-04-12 ENCOUNTER — Encounter (HOSPITAL_COMMUNITY): Payer: Self-pay | Admitting: *Deleted

## 2015-04-12 MED ORDER — DEXTROSE 5 % IV SOLN
1.5000 g | INTRAVENOUS | Status: AC
  Administered 2015-04-13: 1.5 g via INTRAVENOUS
  Filled 2015-04-12: qty 1.5

## 2015-04-13 ENCOUNTER — Ambulatory Visit (HOSPITAL_COMMUNITY): Payer: Medicare Other | Admitting: Anesthesiology

## 2015-04-13 ENCOUNTER — Encounter (HOSPITAL_COMMUNITY): Payer: Self-pay | Admitting: *Deleted

## 2015-04-13 ENCOUNTER — Ambulatory Visit (HOSPITAL_COMMUNITY): Payer: Medicare Other

## 2015-04-13 ENCOUNTER — Encounter (HOSPITAL_COMMUNITY)
Admission: RE | Disposition: A | Discharge status: Skilled Nursing Facility | Payer: Self-pay | Source: Ambulatory Visit | Attending: Surgery

## 2015-04-13 ENCOUNTER — Ambulatory Visit (HOSPITAL_COMMUNITY)
Admission: RE | Admit: 2015-04-13 | Discharge: 2015-04-13 | Disposition: A | Discharge status: Skilled Nursing Facility | Payer: Medicare Other | Source: Ambulatory Visit | Attending: Surgery | Admitting: Surgery

## 2015-04-13 DIAGNOSIS — I10 Essential (primary) hypertension: Secondary | ICD-10-CM | POA: Diagnosis not present

## 2015-04-13 DIAGNOSIS — J910 Malignant pleural effusion: Secondary | ICD-10-CM | POA: Diagnosis not present

## 2015-04-13 DIAGNOSIS — Z96641 Presence of right artificial hip joint: Secondary | ICD-10-CM | POA: Insufficient documentation

## 2015-04-13 DIAGNOSIS — F419 Anxiety disorder, unspecified: Secondary | ICD-10-CM | POA: Insufficient documentation

## 2015-04-13 DIAGNOSIS — J90 Pleural effusion, not elsewhere classified: Secondary | ICD-10-CM

## 2015-04-13 DIAGNOSIS — M1990 Unspecified osteoarthritis, unspecified site: Secondary | ICD-10-CM | POA: Insufficient documentation

## 2015-04-13 HISTORY — PX: CHEST TUBE INSERTION: SHX231

## 2015-04-13 LAB — COMPREHENSIVE METABOLIC PANEL
ALBUMIN: 2.9 g/dL — AB (ref 3.5–5.0)
ALT: 12 U/L — AB (ref 14–54)
AST: 15 U/L (ref 15–41)
Alkaline Phosphatase: 70 U/L (ref 38–126)
Anion gap: 10 (ref 5–15)
BUN: 13 mg/dL (ref 6–20)
CHLORIDE: 97 mmol/L — AB (ref 101–111)
CO2: 25 mmol/L (ref 22–32)
CREATININE: 0.71 mg/dL (ref 0.44–1.00)
Calcium: 8.9 mg/dL (ref 8.9–10.3)
GFR calc Af Amer: >60 mL/min (ref >60)
GFR calc non Af Amer: >60 mL/min (ref >60)
GLUCOSE: 122 mg/dL — AB (ref 65–99)
POTASSIUM: 4.6 mmol/L (ref 3.5–5.1)
SODIUM: 132 mmol/L — AB (ref 135–145)
Total Bilirubin: 0.6 mg/dL (ref 0.3–1.2)
Total Protein: 5.8 g/dL — ABNORMAL LOW (ref 6.5–8.1)

## 2015-04-13 LAB — CBC
HCT: 40.2 % (ref 36.0–46.0)
Hemoglobin: 13.4 g/dL (ref 12.0–15.0)
MCH: 30.7 pg (ref 26.0–34.0)
MCHC: 33.3 g/dL (ref 30.0–36.0)
MCV: 92.2 fL (ref 78.0–100.0)
PLATELETS: 216 10*3/uL (ref 150–400)
RBC: 4.36 MIL/uL (ref 3.87–5.11)
RDW: 13.4 % (ref 11.5–15.5)
WBC: 11 10*3/uL — AB (ref 4.0–10.5)

## 2015-04-13 LAB — APTT: APTT: 36 s (ref 24–37)

## 2015-04-13 LAB — PROTIME-INR
INR: 1.07 (ref 0.00–1.49)
PROTHROMBIN TIME: 14.1 s (ref 11.6–15.2)

## 2015-04-13 SURGERY — INSERTION, PLEURAL DRAINAGE CATHETER
Anesthesia: Monitor Anesthesia Care | Site: Chest | Laterality: Left

## 2015-04-13 MED ORDER — PHENYLEPHRINE 40 MCG/ML (10ML) SYRINGE FOR IV PUSH (FOR BLOOD PRESSURE SUPPORT)
PREFILLED_SYRINGE | INTRAVENOUS | Status: AC
  Filled 2015-04-13: qty 10

## 2015-04-13 MED ORDER — SODIUM CHLORIDE 0.9 % IJ SOLN
INTRAMUSCULAR | Status: AC
  Filled 2015-04-13: qty 20

## 2015-04-13 MED ORDER — 0.9 % SODIUM CHLORIDE (POUR BTL) OPTIME
TOPICAL | Status: DC | PRN
Start: 2015-04-13 — End: 2015-04-13
  Administered 2015-04-13: 1000 mL

## 2015-04-13 MED ORDER — FENTANYL CITRATE (PF) 250 MCG/5ML IJ SOLN
INTRAMUSCULAR | Status: DC | PRN
  Administered 2015-04-13 (×2): 25 ug via INTRAVENOUS

## 2015-04-13 MED ORDER — ONDANSETRON HCL 4 MG/2ML IJ SOLN
INTRAMUSCULAR | Status: AC
  Filled 2015-04-13: qty 2

## 2015-04-13 MED ORDER — FENTANYL CITRATE (PF) 250 MCG/5ML IJ SOLN
INTRAMUSCULAR | Status: AC
  Filled 2015-04-13: qty 5

## 2015-04-13 MED ORDER — FENTANYL CITRATE (PF) 100 MCG/2ML IJ SOLN: 25.0000 ug | INTRAMUSCULAR | Status: DC | PRN

## 2015-04-13 MED ORDER — ARTIFICIAL TEARS OP OINT
TOPICAL_OINTMENT | OPHTHALMIC | Status: AC
  Filled 2015-04-13: qty 10.5

## 2015-04-13 MED ORDER — NEOSTIGMINE METHYLSULFATE 10 MG/10ML IV SOLN
INTRAVENOUS | Status: AC
  Filled 2015-04-13: qty 1

## 2015-04-13 MED ORDER — ONDANSETRON HCL 4 MG/2ML IJ SOLN: 4.0000 mg | Freq: Once | INTRAMUSCULAR | Status: DC | PRN

## 2015-04-13 MED ORDER — ETOMIDATE 2 MG/ML IV SOLN
INTRAVENOUS | Status: AC
  Filled 2015-04-13: qty 10

## 2015-04-13 MED ORDER — GLYCOPYRROLATE 0.2 MG/ML IJ SOLN
INTRAMUSCULAR | Status: AC
  Filled 2015-04-13: qty 3

## 2015-04-13 MED ORDER — PROPOFOL 10 MG/ML IV BOLUS
INTRAVENOUS | Status: DC | PRN
  Administered 2015-04-13 (×6): 10 mg via INTRAVENOUS

## 2015-04-13 MED ORDER — LIDOCAINE HCL 1 % IJ SOLN
INTRAMUSCULAR | Status: DC | PRN
  Administered 2015-04-13: 9 mL

## 2015-04-13 MED ORDER — LACTATED RINGERS IV SOLN
INTRAVENOUS | Status: DC
  Administered 2015-04-13: 09:00:00 via INTRAVENOUS

## 2015-04-13 SURGICAL SUPPLY — 29 items
ADH SKN CLS APL DERMABOND .7 (GAUZE/BANDAGES/DRESSINGS) ×1
BRUSH SCRUB EZ PLAIN DRY (MISCELLANEOUS) ×3 IMPLANT
CANISTER SUCTION 2500CC (MISCELLANEOUS) ×3 IMPLANT
COVER SURGICAL LIGHT HANDLE (MISCELLANEOUS) ×3 IMPLANT
DERMABOND ADVANCED (GAUZE/BANDAGES/DRESSINGS) ×2
DERMABOND ADVANCED .7 DNX12 (GAUZE/BANDAGES/DRESSINGS) ×1 IMPLANT
DRAPE C-ARM 42X72 X-RAY (DRAPES) ×3 IMPLANT
DRAPE LAPAROSCOPIC ABDOMINAL (DRAPES) ×3 IMPLANT
GAUZE SPONGE 4X4 16PLY XRAY LF (GAUZE/BANDAGES/DRESSINGS) ×2 IMPLANT
GLOVE EUDERMIC 7 POWDERFREE (GLOVE) ×3 IMPLANT
GOWN STRL REUS W/ TWL LRG LVL3 (GOWN DISPOSABLE) ×1 IMPLANT
GOWN STRL REUS W/ TWL XL LVL3 (GOWN DISPOSABLE) ×1 IMPLANT
GOWN STRL REUS W/TWL LRG LVL3 (GOWN DISPOSABLE) ×3
GOWN STRL REUS W/TWL XL LVL3 (GOWN DISPOSABLE) ×3
KIT BASIN OR (CUSTOM PROCEDURE TRAY) ×3 IMPLANT
KIT PLEURX DRAIN CATH 1000ML (MISCELLANEOUS) ×5 IMPLANT
KIT PLEURX DRAIN CATH 15.5FR (DRAIN) ×3 IMPLANT
KIT ROOM TURNOVER OR (KITS) ×3 IMPLANT
NS IRRIG 1000ML POUR BTL (IV SOLUTION) ×3 IMPLANT
PACK GENERAL/GYN (CUSTOM PROCEDURE TRAY) ×3 IMPLANT
PAD ARMBOARD 7.5X6 YLW CONV (MISCELLANEOUS) ×6 IMPLANT
SET DRAINAGE LINE (MISCELLANEOUS) IMPLANT
SUT ETHILON 3 0 PS 1 (SUTURE) ×3 IMPLANT
SUT VIC AB 3-0 X1 27 (SUTURE) ×3 IMPLANT
TAPE CLOTH SURG 4X10 WHT LF (GAUZE/BANDAGES/DRESSINGS) ×2 IMPLANT
TOWEL OR 17X24 6PK STRL BLUE (TOWEL DISPOSABLE) ×3 IMPLANT
TOWEL OR 17X26 10 PK STRL BLUE (TOWEL DISPOSABLE) ×3 IMPLANT
VALVE REPLACEMENT CAP (MISCELLANEOUS) IMPLANT
WATER STERILE IRR 1000ML POUR (IV SOLUTION) ×3 IMPLANT

## 2015-04-13 NOTE — H&P (Signed)
[address]       [place] [address]             [phone]      Cardiothoracic Surgery History and Physical   Reason for Surgery: Malignant Left pleural effusion and  left lung nodule   [name] is an 82 y.o. female.  HPI:   The patient is a 82 year old woman who I saw back in 12/2012 for a mass in the left lung apex found on a CT scan of the neck done in May 2014 after she suffered a fall. A CT of the chest on 08/25/2012 showed a 3.7 x 2.0 x 2.2 cm left apical lung mass. It was unchanged on CT done 12/08/2012. A PET scan on 01/06/2013 showed no change in the size of the mass but it did have some patchy hypermetabolic uptake with an SUV of 3.8. There was equivocal left hilar and right infrahilar nodal activity with SUV of 4.8 and 5.3, respectively. The background activity was 4.0. She has been a life-long nonsmoker. She was not felt to be a surgical candidate at that time due to her advanced age and was strongly against chemotherapy or XRT so a needle biopsy was not done. We decided to follow this for a while and a follow up CT in 06/2013 showed slight increase in the size of the left apical lung mass with possible satellite nodularity. She still did not want to pursue any therapy but her and her family wanted to continue following this. A repeat CT on 01/18/2014 showed further increase in the size of the lesion with extension toward the pleura and a satellite nodule in the left upper lobe as well as a subpleural nodule in the right upper lobe.   She was recently evaluated for abdominal pain and found to have cholelithiasis and gallbladder thickening and underwent laparoscopic cholecystectomy.A preop CXR showed a new small to moderate sized left pleural effusion. She underwent left thoracentesis by IR and cytology was positive for adenocarcinoma. She was discharged to SNF and returned to my office this week for follow up of the left pleural effusion. She  continues to feel weak but denies any shortness of breath.  Past Medical History  Diagnosis Date  . History of hiatal hernia   . Mitral valve prolapse   . Plantar fasciitis   . Hay fever   . GERD (gastroesophageal reflux disease)   . Palpitations   . Neuromuscular disorder ([place])   . Deafness     "very HOH; even w/hearing aides we have to communicate via writing things down for her to read" (08/25/2012)  . H/O hiatal hernia   . Arthritis     "a little all over" (08/25/2012)  . Lung mass     Hx: of  . Anxiety   . Hemorrhoids   . Neuropathy ([place])   . Hypertension   . Hyperglycemia     Past Surgical History  Procedure Laterality Date  . Foot surgery Bilateral ~ 2000    "pinched nerve on feet" (08/25/2012)  . Vesicovaginal fistula closure w/ tah    . Abdominal hysterectomy  1998  . Tonsillectomy and adenoidectomy  1940's  . Cesarean section  1952  . Knee arthroscopy w/ meniscectomy Left 07/15/2010    partial medial and lateral meniscectomies.Archie Endo 07/15/2010 (08/25/2012)  . Hip arthroplasty Right 08/26/2012    Procedure: ARTHROPLASTY BIPOLAR HIP; Surgeon: Mauri Pole, MD; Location: [facility]; Service:  Orthopedics; Laterality: Right;  . Open reduction internal fixation (orif) distal radial fracture Right 09/11/2012    Procedure: OPEN REDUCTION INTERNAL FIXATION (ORIF) RIGHT DISTAL RADIAL FRACTURE/ULNAR POSSIBLE BONE GRAFTING; Surgeon: Sharma Covert, MD; Location: [facility]; Service: Orthopedics; Laterality: Right;  . Cataract extraction, bilateral Bilateral   . Laparoscopic cholecystectomy  03/19/2015    Family History  Problem Relation Age of Onset  . Colon cancer Mother   . Colon cancer Sister   . Cancer Sister     Breast  . Colon cancer Brother   . Heart disease Brother     Heart Attack  . Cancer Father      Leukemia  . Pneumonia Sister     Social History:  reports that she has never smoked. She has never used smokeless tobacco. She reports that she does not drink alcohol or use illicit drugs.  Allergies:  Allergies  Allergen Reactions  . Statins Other (See Comments)    myopathy  . Aleve [Naproxen Sodium] Swelling    Legs and feet    Medications:  I have reviewed the patient's current medications. Prior to Admission:  Prescriptions prior to admission  Medication Sig Dispense Refill Last Dose  . furosemide (LASIX) 20 MG tablet Take 20 mg by mouth daily.  3 03/16/2015 at Unknown time  . KLOR-CON M20 20 MEQ tablet Take 40 mEq by mouth daily.  6 03/16/2015 at Unknown time  . losartan (COZAAR) 50 MG tablet TAKE 1 TABLET (50 MG TOTAL) BY MOUTH DAILY. 30 tablet 9 03/16/2015 at Unknown time  . metoprolol succinate (TOPROL-XL) 100 MG 24 hr tablet TAKE ONE TABLET BY MOUTH ONCE DAILY. (Patient taking differently: TAKE 100 MG BY MOUTH ONCE DAILY) 30 tablet 5 03/16/2015 at 800  . metroNIDAZOLE (METROCREAM) 0.75 % cream Apply 1 application topically daily.   unknown  . Multiple Vitamin (MULTIVITAMIN WITH MINERALS) TABS tablet Take 1 tablet by mouth daily.   03/16/2015 at Unknown time  . pregabalin (LYRICA) 75 MG capsule Take 1 capsule (75 mg total) by mouth 3 (three) times daily. 90 capsule 0 03/16/2015 at Unknown time  . Probiotic Product (PROBIOTIC DAILY PO) Take 1 tablet by mouth daily.   03/16/2015 at Unknown time  . sertraline (ZOLOFT) 100 MG tablet Take 1 tablet (100 mg total) by mouth at bedtime. 30 tablet 3 03/15/2015 at Unknown time   Scheduled: . antiseptic oral rinse 7 mL Mouth Rinse q12n4p  . chlorhexidine 15 mL Mouth Rinse BID  . docusate sodium 100 mg Oral BID  . feeding supplement 1 Container Oral TID BM  . furosemide 20 mg Oral Daily  . losartan 50 mg Oral Daily    . metoprolol succinate 100 mg Oral Daily  . pantoprazole 40 mg Oral Daily  . polyethylene glycol 17 g Oral Daily  . potassium chloride SA 40 mEq Oral Daily  . pregabalin 75 mg Oral TID  . sertraline 100 mg Oral QHS   Continuous: . sodium chloride 50 mL/hr at 03/19/15 1452  . lactated ringers 50 mL/hr at 03/19/15 1210   PZK:ZPBBPMOWCCRTR, HYDROmorphone (DILAUDID) injection, ondansetron (ZOFRAN) IV, oxyCODONE-acetaminophen Anti-infectives    Start   Dose/Rate Route Frequency Ordered Stop   03/19/15 0930  ceFAZolin (ANCEF) IVPB 2 g/50 mL premix    2 g 100 mL/hr over 30 Minutes Intravenous To Surgery 03/19/15 0915 03/19/15 1300       Lab Results Last 48 Hours    Results for orders placed or performed during the hospital encounter of  03/16/15 (from the past 48 hour(s))  Surgical PCR screen Status: None   Collection Time: 03/18/15 8:23 PM  Result Value Ref Range   MRSA, PCR NEGATIVE NEGATIVE   Staphylococcus aureus NEGATIVE NEGATIVE    Comment:   The Xpert SA Assay (FDA approved for NASAL specimens in patients over 75 years of age), is one component of a comprehensive surveillance program. Test performance has been validated by [facility] for patients greater than or equal to 68 year old. It is not intended to diagnose infection nor to guide or monitor treatment.   Comprehensive metabolic panel Status: Abnormal   Collection Time: 03/20/15 4:22 AM  Result Value Ref Range   Sodium 133 (L) 135 - 145 mmol/L   Potassium 4.3 3.5 - 5.1 mmol/L   Chloride 100 (L) 101 - 111 mmol/L   CO2 26 22 - 32 mmol/L   Glucose, Bld 135 (H) 65 - 99 mg/dL   BUN 11 6 - 20 mg/dL   Creatinine, Ser 0.66 0.44 - 1.00 mg/dL   Calcium 8.8 (L) 8.9 - 10.3 mg/dL   Total Protein 5.3 (L) 6.5 - 8.1 g/dL   Albumin 2.8 (L) 3.5 - 5.0 g/dL   AST 43 (H) 15 -  41 U/L   ALT 35 14 - 54 U/L   Alkaline Phosphatase 58 38 - 126 U/L   Total Bilirubin 0.6 0.3 - 1.2 mg/dL   GFR calc non Af Amer >60 >60 mL/min   GFR calc Af Amer >60 >60 mL/min    Comment: (NOTE) The eGFR has been calculated using the CKD EPI equation. This calculation has not been validated in all clinical situations. eGFR's persistently <60 mL/min signify possible Chronic Kidney Disease.    Anion gap 7 5 - 15  CBC Status: Abnormal   Collection Time: 03/20/15 4:22 AM  Result Value Ref Range   WBC 10.3 4.0 - 10.5 K/uL   RBC 3.84 (L) 3.87 - 5.11 MIL/uL   Hemoglobin 12.1 12.0 - 15.0 g/dL   HCT 36.6 36.0 - 46.0 %   MCV 95.3 78.0 - 100.0 fL   MCH 31.5 26.0 - 34.0 pg   MCHC 33.1 30.0 - 36.0 g/dL   RDW 13.5 11.5 - 15.5 %   Platelets 201 150 - 400 K/uL       Imaging Results (Last 48 hours)    Dg Cholangiogram Operative  03/19/2015 CLINICAL DATA: 82 year old female with cholecystitis EXAM: INTRAOPERATIVE CHOLANGIOGRAM TECHNIQUE: Cholangiographic images from the C-arm fluoroscopic device were submitted for interpretation post-operatively. Please see the procedural report for the amount of contrast and the fluoroscopy time utilized. COMPARISON: Abdominal ultrasound 03/09/2015 FINDINGS: Cine images and multiple spot radiographs obtained during intraoperative cholangiogram at the time of laparoscopic cholecystectomy. The images demonstrate cannulation of the cystic duct remanent and opacification of the biliary tree. No evidence of biliary ductal dilatation, stenosis or stricture. No choledocholithiasis. There are a few mobile filling defects consistent with injected bubbles. Contrast material passes freely through the ampulla and into the duodenum. IMPRESSION: Negative intraoperative cholangiogram. Electronically Signed By: Jacqulynn Cadet M.D. On: 03/19/2015 13:52    Essentially deaf. I had to write down  questions for her and her answers are rambling. Review of Systems  Constitutional: Positive for weight loss.  HENT: Positive for hearing loss.  Respiratory: Positive for shortness of breath. Negative for cough and hemoptysis.  Cardiovascular: Negative for chest pain.  Gastrointestinal: Positive for abdominal pain.   Blood pressure 119/70, pulse 64, temperature 97.8 F (36.6 C),  temperature source Oral, resp. rate 11, height '5\' 6"'$  (1.676 m), weight 75.751 kg (167 lb), SpO2 96 %. Physical Exam  Constitutional:  Elderly woman in no distress. Lying flat in bed. Talks continuously without noticeable shortness of breath.  Neck: Normal range of motion. Neck supple.  Cardiovascular: Normal rate, regular rhythm and normal heart sounds.  Respiratory: Effort normal. No respiratory distress.  Decreased breath sounds in left lower lobe GI: Soft.   Lymphadenopathy:   She has no cervical adenopathy.          Diagnostic Tests:  CLINICAL DATA: Pleural effusion. Wheezing. Shortness of breath.  EXAM: CHEST 2 VIEW  COMPARISON: 03/23/2015.  FINDINGS: Consolidation of the lower half of the left hemi thorax is noted consistent with left lower lobe infiltrate with prominent left pleural effusion. Right lung is clear. Cardiomegaly with normal pulmonary vascularity. No acute bony abnormality.  IMPRESSION: Of the  Opacification lower half of the left hemi thorax consistent with left lower lobe consolidation and left pleural effusion .   Electronically Signed  By: Marcello Moores Register  On: 04/11/2015 12:22   Impression:  She has a recurrent left pleural effusion that is malignant with a left upper lobe lung mass that is probably adenocarcinoma. She is not a candidate for chemotherapy given her age and condition. She does not want any treatment but is agreeable to a PleurX catheter to treat her malignant left pleural effusion. I think that is the best course of action to  keep her breathing as comfortably as possible. I discussed the procedure with the patient and her family including the alternative of repeated thoracentesis, benefits and risks including but not limited to bleeding, infection, pneumothorax, incomplete drainage, catheter malfunction and the possible need for talc pleurodesis. She appears to understand and agrees to proceed.  Plan:  Left PleurX catheter on Friday 04/13/2015   [name], MD [facility] [phone]

## 2015-04-13 NOTE — Anesthesia Postprocedure Evaluation (Signed)
Anesthesia Post Note  Patient: [name]  Procedure(s) Performed: Procedure(s) (LRB): INSERTION PLEURAL DRAINAGE CATHETER (Left)  Patient location during evaluation: PACU Anesthesia Type: General Level of consciousness: awake and alert Pain management: pain level controlled Vital Signs Assessment: post-procedure vital signs reviewed and stable Respiratory status: spontaneous breathing, nonlabored ventilation, respiratory function stable and patient connected to nasal cannula oxygen Cardiovascular status: blood pressure returned to baseline and stable Postop Assessment: no signs of nausea or vomiting Anesthetic complications: no    Last Vitals:  Filed Vitals:   04/13/15 1230 04/13/15 1245  BP: 114/87 150/77  Pulse: 74 70  Temp:  36.2 C  Resp: 24 20    Last Pain:  Filed Vitals:   04/13/15 1339  PainSc: 0-No pain                 Catalina Gravel

## 2015-04-13 NOTE — Brief Op Note (Signed)
04/13/2015  11:38 AM  PATIENT:  [name]  82 y.o. female  PRE-OPERATIVE DIAGNOSIS:  Recurrent Malignant LEFT PLEURAL EFFUSION  POST-OPERATIVE DIAGNOSIS:  Same  PROCEDURE:  Procedure(s): INSERTION PLEURAL DRAINAGE CATHETER (Left)  SURGEON:  Surgeon(s) and Role:    * [name], MD - Primary  PHYSICIAN ASSISTANT: none  ASSISTANTS: none   ANESTHESIA:   local and MAC  EBL:     BLOOD ADMINISTERED:none  DRAINS: none   LOCAL MEDICATIONS USED:  LIDOCAINE   SPECIMEN:  No Specimen  DISPOSITION OF SPECIMEN:  N/A  COUNTS:  YES  TOURNIQUET:  * No tourniquets in log *  DICTATION: .Note written in EPIC  PLAN OF CARE: Discharge to home after PACU  PATIENT DISPOSITION:  PACU - hemodynamically stable.   Delay start of Pharmacological VTE agent (>24hrs) due to surgical blood loss or risk of bleeding: not applicable

## 2015-04-13 NOTE — Transfer of Care (Signed)
Immediate Anesthesia Transfer of Care Note  Patient: [name]  Procedure(s) Performed: Procedure(s): INSERTION PLEURAL DRAINAGE CATHETER (Left)  Patient Location: PACU  Anesthesia Type:MAC  Level of Consciousness: awake, alert , oriented and patient cooperative  Airway & Oxygen Therapy: Patient Spontanous Breathing  Post-op Assessment: Report given to RN  Post vital signs: stable  Last Vitals:  Filed Vitals:   04/13/15 0811  BP: 177/71  Pulse: 83  Temp: 36.5 C  Resp: 16    Complications: No apparent anesthesia complications

## 2015-04-13 NOTE — Progress Notes (Signed)
Most information obtained by speaking with nursing staff at [facility] due to pt being hard of hearing to spite wearing bilateral hearing aids. Pt's son/Clyde also at bedside, daughter's [name]) cell number is [phone].

## 2015-04-13 NOTE — Interval H&P Note (Signed)
History and Physical Interval Note:  04/13/2015 10:25 AM  [name]  has presented today for surgery, with the diagnosis of LEFT PLEURAL EFFUSION  The various methods of treatment have been discussed with the patient and family. After consideration of risks, benefits and other options for treatment, the patient has consented to  Procedure(s): INSERTION PLEURAL DRAINAGE CATHETER (Left) as a surgical intervention .  The patient's history has been reviewed, patient examined, no change in status, stable for surgery.  I have reviewed the patient's chart and labs.  Questions were answered to the patient's satisfaction.     [name]

## 2015-04-13 NOTE — Anesthesia Preprocedure Evaluation (Addendum)
Anesthesia Evaluation  Patient identified by MRN, date of birth, ID band Patient awake    Reviewed: Allergy & Precautions, NPO status , Patient's Chart, lab work & pertinent test results  History of Anesthesia Complications (+) PONV and history of anesthetic complications  Airway Mallampati: III  TM Distance: <3 FB Neck ROM: Full    Dental  (+) Teeth Intact   Pulmonary  Lung mass with left pleural effusion    + decreased breath sounds (left lung)      Cardiovascular Exercise Tolerance: Poor hypertension, Pt. on medications and Pt. on home beta blockers (-) Past MI and (-) CHF + Valvular Problems/Murmurs MVP  Rhythm:Regular Rate:Normal  Echo 03/16/15: Study Conclusions  - Left ventricle: The cavity size was normal. Wall thickness wasincreased in a pattern of mild LVH. Systolic function wasvigorous. The estimated ejection fraction was in the range of 65%to 70%. Doppler parameters are consistent with abnormal leftventricular relaxation (grade 1 diastolic dysfunction). - Mitral valve: Calcified annulus. Mildly thickened leaflets . - Pericardium, extracardiac: There was a left pleural effusion.    Neuro/Psych PSYCHIATRIC DISORDERS Anxiety  Neuromuscular disease    GI/Hepatic Neg liver ROS, hiatal hernia, GERD  ,gallstones   Endo/Other    Renal/GU negative Renal ROS     Musculoskeletal  (+) Arthritis ,   Abdominal   Peds  Hematology negative hematology ROS (+)   Anesthesia Other Findings   Reproductive/Obstetrics                           Anesthesia Physical Anesthesia Plan  ASA: III  Anesthesia Plan: MAC   Post-op Pain Management:    Induction: Intravenous  Airway Management Planned: Nasal Cannula  Additional Equipment:   Intra-op Plan:   Post-operative Plan:   Informed Consent: I have reviewed the patients History and Physical, chart, labs and discussed the procedure  including the risks, benefits and alternatives for the proposed anesthesia with the patient or authorized representative who has indicated his/her understanding and acceptance.   Dental advisory given  Plan Discussed with: CRNA and Anesthesiologist  Anesthesia Plan Comments: (Discussed risks/benefits/alternatives to MAC sedation including need for ventilatory support, hypotension, need for conversion to general anesthesia.  All patient questions answered.  Patient wished to proceed.)        Anesthesia Quick Evaluation

## 2015-04-13 NOTE — Discharge Instructions (Signed)
PleurX catheter to be drained daily with dressing changed around catheter daily. Record amount of drainage on flow sheet.

## 2015-04-13 NOTE — Op Note (Signed)
CARDIOTHORACIC SURGERY OPERATIVE NOTE   04/13/2015 [name] [id number]  Surgeon: [name], MD   First Assistant: none   Preoperative Diagnosis: Recurrent malignant left pleural effusion  Postoperative Diagnosis: same   Procedure:   1.Insertion of Left PleurX catheter   Anesthesia: MAC with local   Clinical History/Surgical Indication:    The patient is a 82 year old woman who I saw back in 12/2012 for a mass in the left lung apex found on a CT scan of the neck done in May 2014 after she suffered a fall. A CT of the chest on 08/25/2012 showed a 3.7 x 2.0 x 2.2 cm left apical lung mass. It was unchanged on CT done 12/08/2012. A PET scan on 01/06/2013 showed no change in the size of the mass but it did have some patchy hypermetabolic uptake with an SUV of 3.8. There was equivocal left hilar and right infrahilar nodal activity with SUV of 4.8 and 5.3, respectively. The background activity was 4.0. She has been a life-long nonsmoker. She was not felt to be a surgical candidate at that time due to her advanced age and was strongly against chemotherapy or XRT so a needle biopsy was not done. We decided to follow this for a while and a follow up CT in 06/2013 showed slight increase in the size of the left apical lung mass with possible satellite nodularity. She still did not want to pursue any therapy but her and her family wanted to continue following this. A repeat CT on 01/18/2014 showed further increase in the size of the lesion with extension toward the pleura and a satellite nodule in the left upper lobe as well as a subpleural nodule in the right upper lobe.   She was recently evaluated for abdominal pain and found to have cholelithiasis and gallbladder thickening and underwent laparoscopic cholecystectomy.A preop CXR showed a new small to moderate sized left pleural effusion. She underwent left thoracentesis by IR and cytology was positive for adenocarcinoma. She was discharged  to SNF and returned to my office this week for follow up of the left pleural effusion. She continues to feel weak but denies any shortness of breath.  She has a recurrent left pleural effusion that is malignant with a left upper lobe lung mass that is probably adenocarcinoma. She is not a candidate for chemotherapy given her age and condition. She does not want any treatment but is agreeable to a PleurX catheter to treat her malignant left pleural effusion. I think that is the best course of action to keep her breathing as comfortably as possible. I discussed the procedure with the patient and her family including the alternative of repeated thoracentesis, benefits and risks including but not limited to bleeding, infection, pneumothorax, incomplete drainage, catheter malfunction and the possible need for talc pleurodesis. She appears to understand and agrees to proceed.   Preparation:  The patient was seen in the preoperative holding area and the correct patient, correct operation, correct operative sidewere confirmed with the patient after reviewing the medical record and CT scan. The left side of the chest was signed by me. The consent was signed by me. Preoperative antibiotics were given. The patient was taken back to the operating room and positioned supine on the operating room table. After intravenous sedation by anesthesia the chest was prepped with betadine soap and solution and draped in the usual sterile manner. A surgical time-out was taken and the correct patient,operative side, and operative procedure were confirmed  with the nursing and anesthesia staff.   Operative Procedure:   The chest wall entry site was located on the left lateral chest approximately in the 8th ICS. 1% lidocaine was infiltrated in the skin and subcutaneous tissue down to the pleural space. Serous fluid was encountered. A small incision was made and the left pleural space was entered with a needle catheter. The needle  was removed and the guidewire inserted into the pleural space. Its position was checked with floroscopy. The skin exit site was chosen on the anterior chest wall just above the costal margin and lidocaine was infiltrated here and along a subcutaneous tunnel over to the chest wall entry site. A small incision was made at the skin exit site and the Pleurx catheter was tunneled from the exit site over to the chest wall entry site and positioned with the cuff just inside the exit site. An introducer and sheath were inserted into the pleural space over the guide wire and the introducer and wire removed. The catheter was inserted into the pleural space and the sheath removed. The catheter was connected to vacuum bottle suction and 1.5 liters of serous pleural fluid was removed. A sample was sent for cytology. The catheter was fixed to the skin at the exit site with a nylon suture and the other incision was closed with a 3-0 vicryl subcuticular suture and Dermabond. The catheter was capped and a dressing applied over the exit site. The patient tolerated the procedure well with mild coughing. The sponge, needle and instrument counts were correct according to the nurses and the patient was transported to the PACU in stable and satisfactory condition.

## 2015-04-13 NOTE — Care Management Note (Signed)
Case Management Note  Patient Details  Name: [name] MRN: [id number] Date of Birth: [birth year]  Subjective/Objective:                  82 year old woman who I saw back in 12/2012 for a mass in the left lung apex found on a CT scan of the neck done in May 2014 after she suffered a fall. A CT of the chest on 08/25/2012 showed a 3.7 x 2.0 x 2.2 cm left apical lung mass.//From [facility]  Action/Plan: Follow for disposition needs.   Expected Discharge Date:        04/13/15          Expected Discharge Plan:  [place]  In-House Referral:  NA  Discharge planning Services  CM Consult  Post Acute Care Choice:  Durable Medical Equipment Choice offered to:  [facility] POA / Guardian, Patient  DME Arranged:  Chest tube pluerex DME Agency:  Other - Comment  HH Arranged:  NA HH Agency:  NA  Status of Service:  Completed, signed off  Medicare Important Message Given:    Date Medicare IM Given:    Medicare IM give by:    Date Additional Medicare IM Given:    Additional Medicare Important Message give by:     If discussed at [place] of Stay Meetings, dates discussed:    Additional Comments: EDCM consulted to assist with PleurX care and DME.  EDCM called [facility] to confirm that they were familiar with PleurX catheter care.  Spoke with Solmon Ice, RN who states facility competent with care but did not have supplies.  EDCM assured [name] that pt will arrive with fresh case and [facility] will fax order forms to [facility] per protocol.   Fuller Mandril, RN 04/13/2015, 2:40 PM

## 2015-04-16 ENCOUNTER — Encounter (HOSPITAL_COMMUNITY): Payer: Self-pay | Admitting: Surgery

## 2015-04-25 ENCOUNTER — Encounter: Payer: Self-pay | Admitting: Surgery

## 2015-04-25 ENCOUNTER — Ambulatory Visit (INDEPENDENT_AMBULATORY_CARE_PROVIDER_SITE_OTHER): Payer: Medicare Other | Admitting: Surgery

## 2015-04-25 VITALS — BP 90/58 | HR 91 | Resp 16 | Ht 68.0 in | Wt 162.0 lb

## 2015-04-25 DIAGNOSIS — J90 Pleural effusion, not elsewhere classified: Secondary | ICD-10-CM

## 2015-04-25 DIAGNOSIS — J948 Other specified pleural conditions: Secondary | ICD-10-CM | POA: Diagnosis not present

## 2015-04-25 DIAGNOSIS — Z09 Encounter for follow-up examination after completed treatment for conditions other than malignant neoplasm: Secondary | ICD-10-CM

## 2015-04-25 NOTE — Progress Notes (Signed)
     HPI:  Patient returns for routine postoperative follow-up having undergone insertion of a left PleurX catheter on 04/13/2015 for a recurrent malignant left pleural effusion. She is at a SNF now and the catheter is being drained every day. The amount has only been 50 cc daily for the past few days. Her main complaint is about the catheter dressing irritating her skin.    Current Outpatient Prescriptions  Medication Sig Dispense Refill  . acetaminophen (TYLENOL) 325 MG tablet Take 1-2 tablets (325-650 mg total) by mouth every 4 (four) hours as needed for mild pain.    Marland Kitchen docusate sodium (COLACE) 100 MG capsule Take 1 capsule (100 mg total) by mouth 2 (two) times daily as needed for mild constipation. 10 capsule 0  . feeding supplement (BOOST / RESOURCE BREEZE) LIQD Take 1 Container by mouth 3 (three) times daily between meals.  0  . furosemide (LASIX) 20 MG tablet Take 20 mg by mouth daily.  3  . KLOR-CON M20 20 MEQ tablet Take 40 mEq by mouth daily.  6  . losartan (COZAAR) 50 MG tablet TAKE 1 TABLET (50 MG TOTAL) BY MOUTH DAILY. 30 tablet 9  . metoprolol succinate (TOPROL-XL) 100 MG 24 hr tablet TAKE ONE TABLET BY MOUTH ONCE DAILY. (Patient taking differently: TAKE 100 MG BY MOUTH ONCE DAILY) 30 tablet 5  . metroNIDAZOLE (METROCREAM) 0.75 % cream Apply 1 application topically daily.    . Multiple Vitamin (MULTIVITAMIN WITH MINERALS) TABS tablet Take 1 tablet by mouth daily.    Marland Kitchen oxyCODONE-acetaminophen (PERCOCET/ROXICET) 5-325 MG tablet Take 1 tablet by mouth every 6 (six) hours as needed for moderate pain. 30 tablet 0  . polyethylene glycol (MIRALAX / GLYCOLAX) packet Take 17 g by mouth 2 (two) times daily. 14 each 0  . pregabalin (LYRICA) 75 MG capsule Take 1 capsule (75 mg total) by mouth 3 (three) times daily. 90 capsule 0  . Probiotic Product (PROBIOTIC DAILY PO) Take 1 tablet by mouth daily.    . sertraline (ZOLOFT) 100 MG tablet Take 1 tablet (100 mg total) by mouth at bedtime. 30  tablet 3   No current facility-administered medications for this visit.    Physical Exam: BP 90/58 mmHg  Pulse 91  Resp 16  Ht '5\' 8"'$  (1.727 m)  Wt 162 lb (73.483 kg)  BMI 24.64 kg/m2  SpO2 93% She looks well for her age. The lungs are clear Catheter site looks good.  Diagnostic Tests:  CXR not done today  Impression:  The drainage from the catheter is minimal.   Plan:  Continue daily drainage and return in one week with a CXR. If drainage stays low and CXR ok then will remove catheter.   [name], MD [facility] [phone]

## 2015-04-27 ENCOUNTER — Encounter: Payer: Self-pay | Admitting: Nurse Practitioner

## 2015-04-27 ENCOUNTER — Non-Acute Institutional Stay (SKILLED_NURSING_FACILITY): Payer: Medicare Other | Admitting: Nurse Practitioner

## 2015-04-27 DIAGNOSIS — K219 Gastro-esophageal reflux disease without esophagitis: Secondary | ICD-10-CM | POA: Diagnosis not present

## 2015-04-27 DIAGNOSIS — R110 Nausea: Secondary | ICD-10-CM | POA: Diagnosis not present

## 2015-04-27 DIAGNOSIS — B373 Candidiasis of vulva and vagina: Secondary | ICD-10-CM

## 2015-04-27 DIAGNOSIS — G894 Chronic pain syndrome: Secondary | ICD-10-CM

## 2015-04-27 DIAGNOSIS — I10 Essential (primary) hypertension: Secondary | ICD-10-CM | POA: Diagnosis not present

## 2015-04-27 DIAGNOSIS — H9193 Unspecified hearing loss, bilateral: Secondary | ICD-10-CM

## 2015-04-27 DIAGNOSIS — B3731 Acute candidiasis of vulva and vagina: Secondary | ICD-10-CM

## 2015-04-27 NOTE — Progress Notes (Signed)
Patient ID: [name], female   DOB: 27-Jun-1915, 82 y.o.   MRN: [id number]    Nursing Home Location:  [place] of Service: SNF (31)  PCP: REED, TIFFANY, DO  Allergies  Allergen Reactions  . Aleve [Naproxen Sodium] Swelling    Legs and feet  . Statins Other (See Comments)    myopathy    Chief Complaint  Patient presents with  . Medical Management of Chronic Issues    routine visit    HPI:  Patient is a 82 y.o. female seen today at [facility] for routine follow up. Pt with a hx of hearing loss, GERD, chronic pain, CAD, HTN. Pt recently hospitalized due to ongoing abdominal pain and underwent laparoscopic cholecystectomy  Pt also with pleural effusion and hx of LUL nodule which she has seen Dr. [name] in the past for potential malignancy. Per epic she does not want any workup regarding her lungs.  Recently with vaginal complaints UA C&S did not reveal UTI and it was noted that she had redness to vaginal area. Pt currently on diflucan for yeast. Pt complained of stomach upset earlier today and was given phenergan IM per nursing. Now she has been more lethargic and harder to arousal throughout the day.  initially sleepy during the start of the visit but pt very talkative and at baseline after a few mins.  Review of Systems:  Review of Systems  Constitutional: Negative for fever and chills.  HENT: Positive for hearing loss.   Eyes:       Glasses  Respiratory: Negative for shortness of breath.   Cardiovascular: Negative for chest pain.  Gastrointestinal: Positive for abdominal pain. Negative for diarrhea and constipation.  Genitourinary: Negative for dysuria.  Musculoskeletal: Positive for back pain.  Neurological: Positive for weakness. Negative for dizziness.  Psychiatric/Behavioral: Negative for agitation. The patient is not nervous/anxious.        Some confusion    Past Medical History  Diagnosis Date  . History of hiatal  hernia   . Mitral valve prolapse   . Plantar fasciitis   . Hay fever   . GERD (gastroesophageal reflux disease)   . Palpitations   . Neuromuscular disorder ([place])   . Deafness     "very HOH; even w/hearing aides we have to communicate via writing things down for her to read" (08/25/2012)  . H/O hiatal hernia   . Arthritis     "a little all over" (08/25/2012)  . Lung mass     Hx: of  . Anxiety   . Hemorrhoids   . Neuropathy ([place])   . Hypertension   . Hyperglycemia    Past Surgical History  Procedure Laterality Date  . Foot surgery Bilateral ~ 2000    "pinched nerve on feet" (08/25/2012)  . Vesicovaginal fistula closure w/ tah    . Abdominal hysterectomy  1998  . Tonsillectomy and adenoidectomy  1940's  . Cesarean section  1952  . Knee arthroscopy w/ meniscectomy Left 07/15/2010    partial medial and lateral meniscectomies.Archie Endo 07/15/2010 (08/25/2012)  . Hip arthroplasty Right 08/26/2012    Procedure: ARTHROPLASTY BIPOLAR HIP;  Surgeon: Mauri Pole, MD;  Location: [place];  Service: Orthopedics;  Laterality: Right;  . Open reduction internal fixation (orif) distal radial fracture Right 09/11/2012    Procedure: OPEN REDUCTION INTERNAL FIXATION (ORIF) RIGHT DISTAL RADIAL FRACTURE/ULNAR POSSIBLE BONE GRAFTING;  Surgeon: Linna Hoff, MD;  Location: [place];  Service: Orthopedics;  Laterality: Right;  . Cataract extraction, bilateral Bilateral   . Laparoscopic cholecystectomy  03/19/2015  . Cholecystectomy N/A 03/19/2015    Procedure: LAPAROSCOPIC CHOLECYSTECTOMY WITH INTRAOPERATIVE CHOLANGIOGRAM;  Surgeon: Donnie Mesa, MD;  Location: [place];  Service: General;  Laterality: N/A;  . Chest tube insertion Left 04/13/2015    Procedure: INSERTION PLEURAL DRAINAGE CATHETER;  Surgeon: [name], MD;  Location: [place] OR;  Service: Thoracic;  Laterality: Left;   Social History:   reports that she has never smoked. She has never used smokeless tobacco. She reports that she does not drink  alcohol or use illicit drugs.  Family History  Problem Relation Age of Onset  . Colon cancer Mother   . Colon cancer Sister   . Cancer Sister     Breast  . Colon cancer Brother   . Heart disease Brother     Heart Attack  . Cancer Father     Leukemia  . Pneumonia Sister     Medications: Patient's Medications  New Prescriptions   No medications on file  Previous Medications   ACETAMINOPHEN (TYLENOL) 325 MG TABLET    Take 1-2 tablets (325-650 mg total) by mouth every 4 (four) hours as needed for mild pain.   DOCUSATE SODIUM (COLACE) 100 MG CAPSULE    Take 1 capsule (100 mg total) by mouth 2 (two) times daily as needed for mild constipation.   FEEDING SUPPLEMENT (BOOST / RESOURCE BREEZE) LIQD    Take 1 Container by mouth 3 (three) times daily between meals.   FUROSEMIDE (LASIX) 20 MG TABLET    Take 20 mg by mouth daily.   KLOR-CON M20 20 MEQ TABLET    Take 40 mEq by mouth daily.   LACTULOSE 20 GM/30ML SOLN    Take by mouth. 30 cc po prn daily for constipation   LOSARTAN (COZAAR) 50 MG TABLET    TAKE 1 TABLET (50 MG TOTAL) BY MOUTH DAILY.   METOPROLOL SUCCINATE (TOPROL-XL) 100 MG 24 HR TABLET    TAKE ONE TABLET BY MOUTH ONCE DAILY.   METRONIDAZOLE (METROCREAM) 0.75 % CREAM    Apply 1 application topically daily.   MULTIPLE VITAMIN (MULTIVITAMIN WITH MINERALS) TABS TABLET    Take 1 tablet by mouth daily.   OXYCODONE-ACETAMINOPHEN (PERCOCET/ROXICET) 5-325 MG TABLET    Take 1 tablet by mouth every 6 (six) hours as needed for moderate pain.   POLYETHYLENE GLYCOL (MIRALAX / GLYCOLAX) PACKET    Take 17 g by mouth 2 (two) times daily.   PREGABALIN (LYRICA) 75 MG CAPSULE    Take 1 capsule (75 mg total) by mouth 3 (three) times daily.   PROBIOTIC PRODUCT (PROBIOTIC DAILY PO)    Take 1 tablet by mouth daily.   SERTRALINE (ZOLOFT) 100 MG TABLET    Take 1 tablet (100 mg total) by mouth at bedtime.  Modified Medications   No medications on file  Discontinued Medications   No medications on file       Physical Exam: Filed Vitals:   04/27/15 1610  BP: 124/75  Pulse: 86  Temp: 97.8 F (36.6 C)  Resp: 18  Height: '5\' 8"'$  (1.727 m)  Weight: 160 lb (72.576 kg)    Physical Exam  Constitutional: She is oriented to person, place, and time. She appears well-developed.  HENT:  Head: Normocephalic and atraumatic.  Nose: Nose normal.  Mouth/Throat: Oropharynx is clear and moist. No oropharyngeal exudate.  Significant hearing loss  Eyes: Conjunctivae and EOM are normal. Pupils are equal,  round, and reactive to light.  glasses  Neck: Neck supple. No JVD present.  Cardiovascular: Normal rate, regular rhythm and normal heart sounds.   Pulmonary/Chest: Effort normal and breath sounds normal. No respiratory distress.  Abdominal: Soft. Bowel sounds are normal. There is tenderness.  In upper quadrants bilaterally   Musculoskeletal: She exhibits no edema.  Neurological: She is alert and oriented to person, place, and time.  Skin: Skin is warm and dry.  Psychiatric: She has a normal mood and affect.    Labs reviewed: Basic Metabolic Panel:  Recent Labs  03/21/15 1430 03/22/15 0237 04/01/15 04/13/15 0811  NA 135 134* 133* 132*  K 4.2 4.2 4.8 4.6  CL 99* 95*  --  97*  CO2 28 29  --  25  GLUCOSE 160* 169*  --  122*  BUN '10 20 17 13  '$ CREATININE 0.70 0.78 0.9 0.71  CALCIUM 8.4* 9.0  --  8.9   Liver Function Tests:  Recent Labs  03/16/15 1120 03/20/15 0422 04/01/15 04/13/15 0811  AST 18 43* 11* 15  ALT 16 35 11 12*  ALKPHOS 63 58 53 70  BILITOT 0.8 0.6  --  0.6  PROT 6.3* 5.3*  --  5.8*  ALBUMIN 3.8 2.8*  --  2.9*    Recent Labs  03/01/15 1024 03/09/15 1034 03/16/15 1120  LIPASE 37 37 38   No results for input(s): AMMONIA in the last 8760 hours. CBC:  Recent Labs  07/24/14 0909  02/08/15 1114  03/16/15 1120  03/21/15 1430 03/22/15 0237 04/01/15 04/13/15 0811  WBC 6.2  < > 8.9  < > 8.4  < > 9.0 8.3 13.8 11.0*  NEUTROABS 3.0  --  6.4  --  5.7  --   --    --   --   --   HGB  --   < >  --   < > 13.5  < > 11.3* 12.3 13.2 13.4  HCT 38.6  < > 38.5  < > 41.0  < > 34.4* 37.7 40 40.2  MCV 94  < > 91  < > 93.8  < > 95.0 94.7  --  92.2  PLT 249  < > 297  < > 275  < > 181 222 247 216  < > = values in this interval not displayed. TSH:  Recent Labs  02/13/15 1515  TSH 1.290   A1C: Lab Results  Component Value Date   HGBA1C 6.2* 02/13/2015   Lipid Panel: No results for input(s): CHOL, HDL, LDLCALC, TRIG, CHOLHDL, LDLDIRECT in the last 8760 hours.   Assessment/Plan 1. Yeast vaginitis -conts on diflucan 100 mg daily for 3 days   2. Nausea without vomiting - reported stomach upset earlier today, denies at this time but was given phenergan and has been more lethargic today.  -dc phenergan and monitor, to get VS q shift  - to obtain cbc and cmp  3. Essential hypertension, benign -blood pressure stable, cont current regimen  4. Chronic pain syndrome Ongoing, pt with chronic pain to joints taking percocet and lyrica for neuropathy. To hold medication for lethargy, to limit narcotics   5. Hearing loss, bilateral -severe hearing loss, making ROS and HPI difficult. Pt communicates with board  6. Gastroesophageal reflux disease, esophagitis presence not specified Stable at this time.     Carlos American. Harle Battiest  [facility] [phone] 8 am - 5 pm) [phone] (after hours)

## 2015-04-28 ENCOUNTER — Other Ambulatory Visit: Payer: Self-pay | Admitting: Internal Medicine

## 2015-04-28 ENCOUNTER — Emergency Department (HOSPITAL_COMMUNITY): Payer: Medicare Other

## 2015-04-28 ENCOUNTER — Inpatient Hospital Stay (HOSPITAL_COMMUNITY)
Admission: EM | Admit: 2015-04-28 | Discharge: 2015-04-30 | DRG: 871 | Disposition: A | Discharge status: Skilled Nursing Facility | Payer: Medicare Other | Attending: Internal Medicine | Admitting: Internal Medicine

## 2015-04-28 ENCOUNTER — Encounter (HOSPITAL_COMMUNITY): Payer: Self-pay | Admitting: Emergency Medicine

## 2015-04-28 DIAGNOSIS — C3490 Malignant neoplasm of unspecified part of unspecified bronchus or lung: Secondary | ICD-10-CM | POA: Diagnosis present

## 2015-04-28 DIAGNOSIS — J90 Pleural effusion, not elsewhere classified: Secondary | ICD-10-CM | POA: Diagnosis present

## 2015-04-28 DIAGNOSIS — Z886 Allergy status to analgesic agent status: Secondary | ICD-10-CM

## 2015-04-28 DIAGNOSIS — J910 Malignant pleural effusion: Secondary | ICD-10-CM | POA: Diagnosis present

## 2015-04-28 DIAGNOSIS — E875 Hyperkalemia: Secondary | ICD-10-CM | POA: Diagnosis present

## 2015-04-28 DIAGNOSIS — N170 Acute kidney failure with tubular necrosis: Secondary | ICD-10-CM | POA: Diagnosis present

## 2015-04-28 DIAGNOSIS — R32 Unspecified urinary incontinence: Secondary | ICD-10-CM | POA: Diagnosis present

## 2015-04-28 DIAGNOSIS — F419 Anxiety disorder, unspecified: Secondary | ICD-10-CM | POA: Diagnosis present

## 2015-04-28 DIAGNOSIS — I341 Nonrheumatic mitral (valve) prolapse: Secondary | ICD-10-CM | POA: Diagnosis present

## 2015-04-28 DIAGNOSIS — J189 Pneumonia, unspecified organism: Secondary | ICD-10-CM | POA: Diagnosis present

## 2015-04-28 DIAGNOSIS — F329 Major depressive disorder, single episode, unspecified: Secondary | ICD-10-CM | POA: Diagnosis present

## 2015-04-28 DIAGNOSIS — R05 Cough: Secondary | ICD-10-CM

## 2015-04-28 DIAGNOSIS — C3492 Malignant neoplasm of unspecified part of left bronchus or lung: Secondary | ICD-10-CM | POA: Diagnosis present

## 2015-04-28 DIAGNOSIS — D649 Anemia, unspecified: Secondary | ICD-10-CM

## 2015-04-28 DIAGNOSIS — N390 Urinary tract infection, site not specified: Secondary | ICD-10-CM | POA: Diagnosis present

## 2015-04-28 DIAGNOSIS — A419 Sepsis, unspecified organism: Principal | ICD-10-CM | POA: Diagnosis present

## 2015-04-28 DIAGNOSIS — Z888 Allergy status to other drugs, medicaments and biological substances status: Secondary | ICD-10-CM

## 2015-04-28 DIAGNOSIS — N179 Acute kidney failure, unspecified: Secondary | ICD-10-CM | POA: Diagnosis present

## 2015-04-28 DIAGNOSIS — E860 Dehydration: Secondary | ICD-10-CM | POA: Diagnosis present

## 2015-04-28 DIAGNOSIS — Z66 Do not resuscitate: Secondary | ICD-10-CM | POA: Diagnosis present

## 2015-04-28 DIAGNOSIS — I10 Essential (primary) hypertension: Secondary | ICD-10-CM | POA: Diagnosis present

## 2015-04-28 DIAGNOSIS — R6521 Severe sepsis with septic shock: Secondary | ICD-10-CM

## 2015-04-28 DIAGNOSIS — G9340 Encephalopathy, unspecified: Secondary | ICD-10-CM | POA: Diagnosis present

## 2015-04-28 DIAGNOSIS — J948 Other specified pleural conditions: Secondary | ICD-10-CM

## 2015-04-28 DIAGNOSIS — K219 Gastro-esophageal reflux disease without esophagitis: Secondary | ICD-10-CM | POA: Diagnosis present

## 2015-04-28 DIAGNOSIS — I5032 Chronic diastolic (congestive) heart failure: Secondary | ICD-10-CM | POA: Diagnosis present

## 2015-04-28 DIAGNOSIS — H9190 Unspecified hearing loss, unspecified ear: Secondary | ICD-10-CM | POA: Diagnosis present

## 2015-04-28 DIAGNOSIS — R059 Cough, unspecified: Secondary | ICD-10-CM

## 2015-04-28 DIAGNOSIS — I959 Hypotension, unspecified: Secondary | ICD-10-CM | POA: Diagnosis present

## 2015-04-28 LAB — URINALYSIS, ROUTINE W REFLEX MICROSCOPIC
Bilirubin Urine: NEGATIVE
Glucose, UA: NEGATIVE mg/dL
Ketones, ur: NEGATIVE mg/dL
NITRITE: NEGATIVE
PH: 6 (ref 5.0–8.0)
Protein, ur: 30 mg/dL — AB
SPECIFIC GRAVITY, URINE: 1.013 (ref 1.005–1.030)

## 2015-04-28 LAB — BODY FLUID CELL COUNT WITH DIFFERENTIAL
LYMPHS FL: 82 %
Monocyte-Macrophage-Serous Fluid: 13 % — ABNORMAL LOW (ref 50–90)
NEUTROPHIL FLUID: 5 % (ref 0–25)
Total Nucleated Cell Count, Fluid: 1168 cu mm — ABNORMAL HIGH (ref 0–1000)

## 2015-04-28 LAB — COMPREHENSIVE METABOLIC PANEL
ALBUMIN: 2.5 g/dL — AB (ref 3.5–5.0)
ALT: 10 U/L — AB (ref 14–54)
ANION GAP: 8 (ref 5–15)
AST: 13 U/L — ABNORMAL LOW (ref 15–41)
Alkaline Phosphatase: 59 U/L (ref 38–126)
BUN: 17 mg/dL (ref 6–20)
CHLORIDE: 103 mmol/L (ref 101–111)
CO2: 27 mmol/L (ref 22–32)
Calcium: 8.3 mg/dL — ABNORMAL LOW (ref 8.9–10.3)
Creatinine, Ser: 1.49 mg/dL — ABNORMAL HIGH (ref 0.44–1.00)
GFR calc non Af Amer: 28 mL/min — ABNORMAL LOW (ref >60)
GFR, EST AFRICAN AMERICAN: 33 mL/min — AB (ref >60)
Glucose, Bld: 130 mg/dL — ABNORMAL HIGH (ref 65–99)
Potassium: 5.3 mmol/L — ABNORMAL HIGH (ref 3.5–5.1)
SODIUM: 138 mmol/L (ref 135–145)
Total Bilirubin: 0.3 mg/dL (ref 0.3–1.2)
Total Protein: 5.1 g/dL — ABNORMAL LOW (ref 6.5–8.1)

## 2015-04-28 LAB — APTT: aPTT: 41 s — ABNORMAL HIGH (ref 24–37)

## 2015-04-28 LAB — CBC WITH DIFFERENTIAL/PLATELET
BASOS PCT: 0 %
Basophils Absolute: 0 10*3/uL (ref 0.0–0.1)
Eosinophils Absolute: 0.1 10*3/uL (ref 0.0–0.7)
Eosinophils Relative: 1 %
HEMATOCRIT: 34.5 % — AB (ref 36.0–46.0)
HEMOGLOBIN: 11.3 g/dL — AB (ref 12.0–15.0)
LYMPHS ABS: 1.3 10*3/uL (ref 0.7–4.0)
LYMPHS PCT: 9 %
MCH: 30.1 pg (ref 26.0–34.0)
MCHC: 32.8 g/dL (ref 30.0–36.0)
MCV: 91.8 fL (ref 78.0–100.0)
MONOS PCT: 9 %
Monocytes Absolute: 1.3 10*3/uL — ABNORMAL HIGH (ref 0.1–1.0)
NEUTROS ABS: 12 10*3/uL — AB (ref 1.7–7.7)
NEUTROS PCT: 81 %
Platelets: 224 10*3/uL (ref 150–400)
RBC: 3.76 MIL/uL — ABNORMAL LOW (ref 3.87–5.11)
RDW: 13.6 % (ref 11.5–15.5)
WBC: 14.7 10*3/uL — ABNORMAL HIGH (ref 4.0–10.5)

## 2015-04-28 LAB — LACTIC ACID, PLASMA
Lactic Acid, Venous: 1.2 mmol/L (ref 0.5–2.0)
Lactic Acid, Venous: 0.8 mmol/L (ref 0.5–2.0)

## 2015-04-28 LAB — GRAM STAIN

## 2015-04-28 LAB — MRSA PCR SCREENING: MRSA by PCR: NEGATIVE

## 2015-04-28 LAB — PROCALCITONIN: Procalcitonin: <0.1 ng/mL

## 2015-04-28 LAB — INFLUENZA PANEL BY PCR (TYPE A & B)
H1N1 flu by pcr: NOT DETECTED
Influenza A By PCR: NEGATIVE
Influenza B By PCR: NEGATIVE

## 2015-04-28 LAB — URINE MICROSCOPIC-ADD ON

## 2015-04-28 LAB — PROTIME-INR
INR: 1.29 (ref 0.00–1.49)
Prothrombin Time: 16.3 s — ABNORMAL HIGH (ref 11.6–15.2)

## 2015-04-28 LAB — BRAIN NATRIURETIC PEPTIDE: B NATRIURETIC PEPTIDE 5: 54.5 pg/mL (ref 0.0–100.0)

## 2015-04-28 LAB — I-STAT CG4 LACTIC ACID, ED: LACTIC ACID, VENOUS: 0.65 mmol/L (ref 0.5–2.0)

## 2015-04-28 MED ORDER — VANCOMYCIN HCL IN DEXTROSE 1-5 GM/200ML-% IV SOLN
1000.0000 mg | Freq: Once | INTRAVENOUS | Status: AC
  Administered 2015-04-28: 1000 mg via INTRAVENOUS
  Filled 2015-04-28: qty 200

## 2015-04-28 MED ORDER — ALBUTEROL SULFATE (2.5 MG/3ML) 0.083% IN NEBU: 2.5000 mg | INHALATION_SOLUTION | RESPIRATORY_TRACT | Status: DC | PRN

## 2015-04-28 MED ORDER — DEXTROSE 5 % IV SOLN
2.0000 g | Freq: Once | INTRAVENOUS | Status: AC
  Administered 2015-04-28: 2 g via INTRAVENOUS
  Filled 2015-04-28: qty 2

## 2015-04-28 MED ORDER — ACETAMINOPHEN 650 MG RE SUPP
650.0000 mg | Freq: Once | RECTAL | Status: AC
  Administered 2015-04-28: 650 mg via RECTAL
  Filled 2015-04-28: qty 1

## 2015-04-28 MED ORDER — MORPHINE SULFATE (PF) 2 MG/ML IV SOLN
2.0000 mg | INTRAVENOUS | Status: DC | PRN
  Administered 2015-04-28 (×2): 2 mg via INTRAVENOUS
  Filled 2015-04-28 (×2): qty 1

## 2015-04-28 MED ORDER — PANTOPRAZOLE SODIUM 40 MG IV SOLR
40.0000 mg | INTRAVENOUS | Status: DC
  Administered 2015-04-28 – 2015-04-29 (×2): 40 mg via INTRAVENOUS
  Filled 2015-04-28 (×2): qty 40

## 2015-04-28 MED ORDER — DEXTROSE 5 % IV SOLN: 1.0000 g | Freq: Three times a day (TID) | INTRAVENOUS | Status: DC

## 2015-04-28 MED ORDER — VANCOMYCIN HCL IN DEXTROSE 1-5 GM/200ML-% IV SOLN: 1000.0000 mg | INTRAVENOUS | Status: DC

## 2015-04-28 MED ORDER — SODIUM CHLORIDE 0.9 % IV BOLUS (SEPSIS)
1000.0000 mL | INTRAVENOUS | Status: AC
  Administered 2015-04-28 (×2): 1000 mL via INTRAVENOUS

## 2015-04-28 MED ORDER — SODIUM POLYSTYRENE SULFONATE 15 GM/60ML PO SUSP
30.0000 g | Freq: Once | ORAL | Status: AC
  Administered 2015-04-28: 30 g via RECTAL
  Filled 2015-04-28: qty 120

## 2015-04-28 MED ORDER — SODIUM POLYSTYRENE SULFONATE 15 GM/60ML PO SUSP: 30.0000 g | Freq: Once | ORAL | Status: DC

## 2015-04-28 MED ORDER — ENOXAPARIN SODIUM 30 MG/0.3ML ~~LOC~~ SOLN
30.0000 mg | SUBCUTANEOUS | Status: DC
  Administered 2015-04-28 – 2015-04-30 (×3): 30 mg via SUBCUTANEOUS
  Filled 2015-04-28 (×3): qty 0.3

## 2015-04-28 MED ORDER — SODIUM CHLORIDE 0.9 % IV BOLUS (SEPSIS)
500.0000 mL | INTRAVENOUS | Status: AC
  Administered 2015-04-28: 500 mL via INTRAVENOUS

## 2015-04-28 MED ORDER — SODIUM CHLORIDE 0.9 % IV SOLN
1000.0000 mL | INTRAVENOUS | Status: DC
  Administered 2015-04-28 (×2): 1000 mL via INTRAVENOUS

## 2015-04-28 MED ORDER — DEXTROSE 5 % IV SOLN
2.0000 g | INTRAVENOUS | Status: DC
  Administered 2015-04-29 – 2015-04-30 (×2): 2 g via INTRAVENOUS
  Filled 2015-04-28 (×2): qty 2

## 2015-04-28 MED ORDER — METRONIDAZOLE 0.75 % EX CREA
1.0000 "application " | TOPICAL_CREAM | Freq: Every day | CUTANEOUS | Status: DC
  Administered 2015-04-28: 1 via TOPICAL
  Filled 2015-04-28: qty 45

## 2015-04-28 MED ORDER — SODIUM CHLORIDE 0.9 % IV SOLN
1000.0000 mL | INTRAVENOUS | Status: DC
  Administered 2015-04-28: 1000 mL via INTRAVENOUS

## 2015-04-28 NOTE — Progress Notes (Signed)
Pt awake for SLP, pt is very hard of hearing and hearing aids are not with her. Pt able to ask questions and read the answers when written on a pad. Pt  Is very alert and oriented to self, situation and place. Pt's daughter called and was updated on pt's condition by this nurse. Consuelo Pandy RN

## 2015-04-28 NOTE — Progress Notes (Signed)
PT Cancellation Note  Patient Details Name: [name] MRN: [id number] DOB: Nov 28, 1915   Cancelled Treatment:    Reason Eval/Treat Not Completed: Patient not medically ready.  Pt w/ strict bed rest orders placed this am.  Will need updated activity orders before PT evaluation can be completed and once pt is medically appropriate.  PT will continue to follow acutely.  Joslyn Hy PT, DPT [phone] Pager: [phone] 04/28/2015, 8:58 AM

## 2015-04-28 NOTE — ED Provider Notes (Signed)
CSN: [id number]     Arrival date & time 04/28/15  0050 History   First MD Initiated Contact with Patient 04/28/15 0111     Chief Complaint  Patient presents with  . Altered Mental Status  . Fever     (Consider location/radiation/quality/duration/timing/severity/associated sxs/prior Treatment) Patient is a 82 y.o. female presenting with altered mental status and fever. The history is provided by the nursing home and a relative. The history is limited by the condition of the patient (Altered mental status).  Altered Mental Status Associated symptoms: fever   Fever 83 -year-old female transferred from nursing home because of fever and confusion. Her son said that she had been having some difficulty sleeping and was given a sedative early this morning and been sleeping all day. Patient is not able to give any history.  Past Medical History  Diagnosis Date  . History of hiatal hernia   . Mitral valve prolapse   . Plantar fasciitis   . Hay fever   . GERD (gastroesophageal reflux disease)   . Palpitations   . Neuromuscular disorder ([place])   . Deafness     "very HOH; even w/hearing aides we have to communicate via writing things down for her to read" (08/25/2012)  . H/O hiatal hernia   . Arthritis     "a little all over" (08/25/2012)  . Lung mass     Hx: of  . Anxiety   . Hemorrhoids   . Neuropathy ([place])   . Hypertension   . Hyperglycemia    Past Surgical History  Procedure Laterality Date  . Foot surgery Bilateral ~ 2000    "pinched nerve on feet" (08/25/2012)  . Vesicovaginal fistula closure w/ tah    . Abdominal hysterectomy  1998  . Tonsillectomy and adenoidectomy  1940's  . Cesarean section  1952  . Knee arthroscopy w/ meniscectomy Left 07/15/2010    partial medial and lateral meniscectomies.Archie Endo 07/15/2010 (08/25/2012)  . Hip arthroplasty Right 08/26/2012    Procedure: ARTHROPLASTY BIPOLAR HIP;  Surgeon: Mauri Pole, MD;  Location: [place];  Service: Orthopedics;   Laterality: Right;  . Open reduction internal fixation (orif) distal radial fracture Right 09/11/2012    Procedure: OPEN REDUCTION INTERNAL FIXATION (ORIF) RIGHT DISTAL RADIAL FRACTURE/ULNAR POSSIBLE BONE GRAFTING;  Surgeon: Linna Hoff, MD;  Location: [place];  Service: Orthopedics;  Laterality: Right;  . Cataract extraction, bilateral Bilateral   . Laparoscopic cholecystectomy  03/19/2015  . Cholecystectomy N/A 03/19/2015    Procedure: LAPAROSCOPIC CHOLECYSTECTOMY WITH INTRAOPERATIVE CHOLANGIOGRAM;  Surgeon: Donnie Mesa, MD;  Location: [facility];  Service: General;  Laterality: N/A;  . Chest tube insertion Left 04/13/2015    Procedure: INSERTION PLEURAL DRAINAGE CATHETER;  Surgeon: [name], MD;  [facility];  Service: Thoracic;  Laterality: Left;   Family History  Problem Relation Age of Onset  . Colon cancer Mother   . Colon cancer Sister   . Cancer Sister     Breast  . Colon cancer Brother   . Heart disease Brother     Heart Attack  . Cancer Father     Leukemia  . Pneumonia Sister    Social History  Substance Use Topics  . Smoking status: Never Smoker   . Smokeless tobacco: Never Used  . Alcohol Use: No   OB History    Gravida Para Term Preterm AB TAB SAB Ectopic Multiple Living   '2 2 2 '$ 0 0 0 0 0  Review of Systems  Unable to perform ROS: Mental status change  Constitutional: Positive for fever.      Allergies  Aleve and Statins  Home Medications   Prior to Admission medications   Medication Sig Start Date End Date Taking? Authorizing Provider  acetaminophen (TYLENOL) 325 MG tablet Take 1-2 tablets (325-650 mg total) by mouth every 4 (four) hours as needed for mild pain. 03/27/15   Nat Christen, PA-C  docusate sodium (COLACE) 100 MG capsule Take 1 capsule (100 mg total) by mouth 2 (two) times daily as needed for mild constipation. 03/27/15   Nat Christen, PA-C  feeding supplement (BOOST / RESOURCE BREEZE) LIQD Take 1 Container by mouth 3 (three)  times daily between meals. 03/27/15   Nat Christen, PA-C  furosemide (LASIX) 20 MG tablet Take 20 mg by mouth daily. 03/06/15   Historical Provider, MD  KLOR-CON M20 20 MEQ tablet Take 40 mEq by mouth daily. 02/10/15   Historical Provider, MD  Lactulose 20 GM/30ML SOLN Take by mouth. 30 cc po prn daily for constipation    Historical Provider, MD  losartan (COZAAR) 50 MG tablet TAKE 1 TABLET (50 MG TOTAL) BY MOUTH DAILY. 09/11/14   Tiffany L Reed, DO  metoprolol succinate (TOPROL-XL) 100 MG 24 hr tablet TAKE ONE TABLET BY MOUTH ONCE DAILY. Patient taking differently: TAKE 100 MG BY MOUTH ONCE DAILY 11/09/14   Tiffany L Reed, DO  metroNIDAZOLE (METROCREAM) 0.75 % cream Apply 1 application topically daily. 03/08/15   Historical Provider, MD  Multiple Vitamin (MULTIVITAMIN WITH MINERALS) TABS tablet Take 1 tablet by mouth daily.    Historical Provider, MD  oxyCODONE-acetaminophen (PERCOCET/ROXICET) 5-325 MG tablet Take 1 tablet by mouth every 6 (six) hours as needed for moderate pain. 03/27/15   Nat Christen, PA-C  polyethylene glycol (MIRALAX / GLYCOLAX) packet Take 17 g by mouth 2 (two) times daily. 03/27/15   Nat Christen, PA-C  pregabalin (LYRICA) 75 MG capsule Take 1 capsule (75 mg total) by mouth 3 (three) times daily. 03/12/15   Tiffany L Reed, DO  Probiotic Product (PROBIOTIC DAILY PO) Take 1 tablet by mouth daily.    Historical Provider, MD  sertraline (ZOLOFT) 100 MG tablet Take 1 tablet (100 mg total) by mouth at bedtime. 03/12/15   Tiffany L Reed, DO   BP 98/64 mmHg  Pulse 97  Temp(Src) 102.4 F (39.1 C) (Rectal)  Resp 16  Ht '5\' 6"'$  (1.676 m)  Wt 175 lb (79.379 kg)  BMI 28.26 kg/m2  SpO2 94% Physical Exam  Nursing note and vitals reviewed.  82 year old female, resting comfortably and in no acute distress. Vital signs are significant for fever with temperature 102.4, and borderline hypotension with blood pressure 98/64. Oxygen saturation is 94%, which is normal. Head is normocephalic  and atraumatic. PERRLA, EOMI. Oropharynx is clear. Neck is nontender and supple without adenopathy or JVD. Back is nontender and there is no CVA tenderness. Lungs are clear without rales, wheezes, or rhonchi. Chest is nontender. Left pleural drain is present. Heart has regular rate and rhythm with 2/6 systolic ejection murmur heard at left sternal border. Abdomen is soft, flat, nontender without masses or hepatosplenomegaly and peristalsis is normoactive. Extremities have no cyanosis or edema, full range of motion is present. Skin is warm and dry without rash. Neurologic: She is awake but mostly stares straight ahead. She is known to be very hard of hearing but does not seem to respond to voice or mildly  painful stimuli. Cranial nerves are intact, there are no gross motor or sensory deficits.  ED Course  Procedures (including critical care time) Labs Review Results for orders placed or performed during the hospital encounter of 04/28/15  Gram stain  Result Value Ref Range   Specimen Description PLEURAL LEFT FLUID    Special Requests NONE    Gram Stain      FEW WBC PRESENT, PREDOMINANTLY MONONUCLEAR NO ORGANISMS SEEN    Report Status 04/28/2015 FINAL   Culture, body fluid-bottle  Result Value Ref Range   Specimen Description FLUID    Special Requests PLEURAL AEROBIC BOTTLE ONLY    Culture PENDING    Report Status PENDING   Comprehensive metabolic panel  Result Value Ref Range   Sodium 138 135 - 145 mmol/L   Potassium 5.3 (H) 3.5 - 5.1 mmol/L   Chloride 103 101 - 111 mmol/L   CO2 27 22 - 32 mmol/L   Glucose, Bld 130 (H) 65 - 99 mg/dL   BUN 17 6 - 20 mg/dL   Creatinine, Ser 1.49 (H) 0.44 - 1.00 mg/dL   Calcium 8.3 (L) 8.9 - 10.3 mg/dL   Total Protein 5.1 (L) 6.5 - 8.1 g/dL   Albumin 2.5 (L) 3.5 - 5.0 g/dL   AST 13 (L) 15 - 41 U/L   ALT 10 (L) 14 - 54 U/L   Alkaline Phosphatase 59 38 - 126 U/L   Total Bilirubin 0.3 0.3 - 1.2 mg/dL   GFR calc non Af Amer 28 (L) >60 mL/min    GFR calc Af Amer 33 (L) >60 mL/min   Anion gap 8 5 - 15  Urinalysis, Routine w reflex microscopic (not at [facility])  Result Value Ref Range   Color, Urine YELLOW YELLOW   APPearance TURBID (A) CLEAR   Specific Gravity, Urine 1.013 1.005 - 1.030   pH 6.0 5.0 - 8.0   Glucose, UA NEGATIVE NEGATIVE mg/dL   Hgb urine dipstick MODERATE (A) NEGATIVE   Bilirubin Urine NEGATIVE NEGATIVE   Ketones, ur NEGATIVE NEGATIVE mg/dL   Protein, ur 30 (A) NEGATIVE mg/dL   Nitrite NEGATIVE NEGATIVE   Leukocytes, UA LARGE (A) NEGATIVE  CBC with Differential  Result Value Ref Range   WBC 14.7 (H) 4.0 - 10.5 K/uL   RBC 3.76 (L) 3.87 - 5.11 MIL/uL   Hemoglobin 11.3 (L) 12.0 - 15.0 g/dL   HCT 34.5 (L) 36.0 - 46.0 %   MCV 91.8 78.0 - 100.0 fL   MCH 30.1 26.0 - 34.0 pg   MCHC 32.8 30.0 - 36.0 g/dL   RDW 13.6 11.5 - 15.5 %   Platelets 224 150 - 400 K/uL   Neutrophils Relative % 81 %   Neutro Abs 12.0 (H) 1.7 - 7.7 K/uL   Lymphocytes Relative 9 %   Lymphs Abs 1.3 0.7 - 4.0 K/uL   Monocytes Relative 9 %   Monocytes Absolute 1.3 (H) 0.1 - 1.0 K/uL   Eosinophils Relative 1 %   Eosinophils Absolute 0.1 0.0 - 0.7 K/uL   Basophils Relative 0 %   Basophils Absolute 0.0 0.0 - 0.1 K/uL  Body fluid cell count with differential  Result Value Ref Range   Fluid Type-FCT PLEURAL    Color, Fluid YELLOW    Appearance, Fluid HAZY (A) CLEAR   WBC, Fluid 1168 (H) 0 - 1000 cu mm   Neutrophil Count, Fluid 5 0 - 25 %   Lymphs, Fluid 82 %   Monocyte-Macrophage-Serous Fluid 13 (L)  50 - 90 %  Procalcitonin  Result Value Ref Range   Procalcitonin <0.10 ng/mL  Urine microscopic-add on  Result Value Ref Range   Squamous Epithelial / LPF 0-5 (A) NONE SEEN   WBC, UA TOO NUMEROUS TO COUNT 0 - 5 WBC/hpf   RBC / HPF 6-30 0 - 5 RBC/hpf   Bacteria, UA MANY (A) NONE SEEN   Urine-Other MUCOUS PRESENT   I-Stat CG4 Lactic Acid, ED  (not at [facility])  Result Value Ref Range   Lactic Acid, Venous 0.65 0.5 - 2.0 mmol/L   Imaging  Review Dg Chest Port 1 View  04/28/2015  CLINICAL DATA:  Code sepsis. EXAM: PORTABLE CHEST 1 VIEW COMPARISON:  04/13/2015 FINDINGS: Shallow inspiration. Mild cardiac enlargement. Mild pulmonary vascular congestion. Mild perihilar infiltration may represent edema or pneumonia. Probable tiny left pleural effusion. No pneumothorax. Calcified and ectatic aorta. Surgical clips in the right upper quadrant. IMPRESSION: Cardiac enlargement with mild pulmonary vascular congestion. Perihilar infiltrates may represent edema or pneumonia. Tiny left pleural effusion. Electronically Signed   By: Lucienne Capers M.D.   On: 04/28/2015 01:55   I have personally reviewed and evaluated these images and lab results as part of my medical decision-making.   EKG Interpretation   Date/Time:  Saturday April 28 2015 00:57:48 EST Ventricular Rate:  98 PR Interval:  174 QRS Duration: 86 QT Interval:  347 QTC Calculation: 443 R Axis:   66 Text Interpretation:  Age not entered, assumed to be  82 years old for  purpose of ECG interpretation Sinus rhythm Normal ECG When compared with  ECG of 03/16/2015, No significant change was found Confirmed by [facility]  MD,  Coni Homesley ([id number]) on 04/28/2015 1:00:26 AM      CRITICAL CARE Performed by: FTDDU,KGURK Total critical care time: 55 minutes Critical care time was exclusive of separately billable procedures and treating other patients. Critical care was necessary to treat or prevent imminent or life-threatening deterioration. Critical care was time spent personally by me on the following activities: development of treatment plan with patient and/or surrogate as well as nursing, discussions with consultants, evaluation of patient's response to treatment, examination of patient, obtaining history from patient or surrogate, ordering and performing treatments and interventions, ordering and review of laboratory studies, ordering and review of radiographic studies, pulse oximetry and  re-evaluation of patient's condition.  MDM   Final diagnoses:  UTI (lower urinary tract infection)  HCAP (healthcare-associated pneumonia)  AKI (acute kidney injury) ([place])  Hypotension, unspecified hypotension type  Normochromic normocytic anemia    Fever with possible sepsis. Old records are reviewed and she had a left pleural drainage system placed 2 weeks ago, and was hospitalized for laparoscopic cholecystectomy 6 weeks ago. Initially, no obvious source of infection. She is started on sepsis protocol and is given early goal-directed fluid therapy. In addition to standard cultures, pleural fluid will be cultured. Patient has DO NOT RESUSCITATE paperwork with her and son confirms that she is DO NOT RESUSCITATE and does not wished to be intubated.  Chest x-ray shows questionable pneumonia. Urinalysis shows clear evidence of urinary tract infection and this is most likely the source. Pleural fluid was sent for culture and Gram stain and is appears to be not infected based on low WBC and predominance of lymphocytes and negative Gram stain. She was started empirically on vancomycin and cefepime which would cover both healthcare associated pneumonia and healthcare associated urinary tract infection. Lactic acid is come back normal but blood pressure has also  stayed about the same range. Old records are reviewed and she has had blood pressure in the 90s previously but most blood pressures were in the normal range. Also, pro-calcitonin has come back normal. Case is discussed with Dr. Blaine Hamper of [facility] who was concerned that the patient might need ICU care. I did discuss the case with Dr. Jimmy Footman who felt the case should be managed in the stepdown unit. Dr. Blaine Hamper has come to admit the patient.  Delora Fuel, MD 15/17/61 [id number]

## 2015-04-28 NOTE — ED Notes (Signed)
Pt. arrived with EMS from [place] , staff reported confused /discoriented with generalized weakness and fever onset today .

## 2015-04-28 NOTE — ED Notes (Addendum)
Dr. Blaine Hamper ( admitting MD ) notified on pt.'s hypotension and total iv fluids infused.

## 2015-04-28 NOTE — Progress Notes (Signed)
Pt very lethargic but will wake up to voice, pt oriented to person but not sure which city or hospital she is in now. Pt denies pain and is very HOH. Consuelo Pandy RN

## 2015-04-28 NOTE — Evaluation (Signed)
Clinical/Bedside Swallow Evaluation Patient Details  Name: [name] MRN: [id number] Date of Birth: 24-Jul-1915  Today's Date: 04/28/2015 Time: SLP Start Time (ACUTE ONLY): 6295 SLP Stop Time (ACUTE ONLY): 0852 SLP Time Calculation (min) (ACUTE ONLY): 17 min  Past Medical History:  Past Medical History  Diagnosis Date  . History of hiatal hernia   . Mitral valve prolapse   . Plantar fasciitis   . Hay fever   . GERD (gastroesophageal reflux disease)   . Palpitations   . Neuromuscular disorder ([place])   . Deafness     "very HOH; even w/hearing aides we have to communicate via writing things down for her to read" (08/25/2012)  . H/O hiatal hernia   . Arthritis     "a little all over" (08/25/2012)  . Lung mass     Hx: of  . Anxiety   . Hemorrhoids   . Neuropathy ([place])   . Hypertension   . Hyperglycemia    Past Surgical History:  Past Surgical History  Procedure Laterality Date  . Foot surgery Bilateral ~ 2000    "pinched nerve on feet" (08/25/2012)  . Vesicovaginal fistula closure w/ tah    . Abdominal hysterectomy  1998  . Tonsillectomy and adenoidectomy  1940's  . Cesarean section  1952  . Knee arthroscopy w/ meniscectomy Left 07/15/2010    partial medial and lateral meniscectomies.Archie Endo 07/15/2010 (08/25/2012)  . Hip arthroplasty Right 08/26/2012    Procedure: ARTHROPLASTY BIPOLAR HIP;  Surgeon: Mauri Pole, MD;  Location: [place];  Service: Orthopedics;  Laterality: Right;  . Open reduction internal fixation (orif) distal radial fracture Right 09/11/2012    Procedure: OPEN REDUCTION INTERNAL FIXATION (ORIF) RIGHT DISTAL RADIAL FRACTURE/ULNAR POSSIBLE BONE GRAFTING;  Surgeon: Linna Hoff, MD;  Location: [place];  Service: Orthopedics;  Laterality: Right;  . Cataract extraction, bilateral Bilateral   . Laparoscopic cholecystectomy  03/19/2015  . Cholecystectomy N/A 03/19/2015    Procedure: LAPAROSCOPIC CHOLECYSTECTOMY WITH INTRAOPERATIVE CHOLANGIOGRAM;  Surgeon: Donnie Mesa, MD;  Location: [facility];  Service: General;  Laterality: N/A;  . Chest tube insertion Left 04/13/2015    Procedure: INSERTION PLEURAL DRAINAGE CATHETER;  Surgeon: [name], MD;  Location[facility];  Service: Thoracic;  Laterality: Left;   HPI:  Pt is a 82 y.o. female with PMH of lung cancer of adenocarcinoma (no surgery, chemotherapy or radiation therapy due to advanced age), left malignant pleural effusion, s/p of left chest tube placement on 04/13/15, hypertension, GERD, depression, anxiety, mitral valve prolapse, deafness, diastolic congestive heart failure, who presents with fever, altered mental status and generalized weakness. Chest x ray showed perihilar infiltrates may represent edema or pneumonia along with Tiny left pleural effusion.    Assessment / Plan / Recommendation Clinical Impression  Pt presents with suspected mild oropharyngeal dysphagia. Pt very hard of hearing slightly impacting ability to follow commands during BSE. Pt with some oral discoordination of bolus with suspected delay in swallow initiation with thin liquids.  Note multiple audible swallows with single bolus and immediate throat clearing when consuming consecutive straw sips of thin liquids. When pt took smaller straw sips of thin liquids, no overt signs or symptoms of reduced airway protection were noted. Pt verbalized preference for use of straws, stating she "swallowed better". Pt exhibited belching throughout PO trials, note hx of GERD. Recommend diet dysphagia 3 (mechancial soft) and thin liquids with medicines whole in puree. Full supervision warranted with all PO. SLP to follow up for diet tolerance.  Aspiration Risk  Mild aspiration risk    Diet Recommendation     Medication Administration: Whole meds with puree    Other  Recommendations Oral Care Recommendations: Oral care BID   Follow up Recommendations  Skilled Nursing facility    Frequency and Duration min 2x/week  1 week       Prognosis  Prognosis for Safe Diet Advancement: Good      Swallow Study   General Date of Onset: 04/28/15 HPI: Pt is a 82 y.o. female with PMH of lung cancer of adenocarcinoma (no surgery, chemotherapy or radiation therapy due to advanced age), left malignant pleural effusion, s/p of left chest tube placement on 04/13/15, hypertension, GERD, depression, anxiety, mitral valve prolapse, deafness, diastolic congestive heart failure, who presents with fever, altered mental status and generalized weakness. Chest x ray showed perihilar infiltrates may represent edema or pneumonia along with Tiny left pleural effusion.  Type of Study: Bedside Swallow Evaluation Previous Swallow Assessment: none on file Diet Prior to this Study: NPO Temperature Spikes Noted: Yes Respiratory Status: Nasal cannula History of Recent Intubation: No Behavior/Cognition: Alert (hard of hearing ) Oral Cavity Assessment: Dry;Dried secretions Oral Care Completed by SLP: Yes Oral Cavity - Dentition: Adequate natural dentition Vision: Functional for self-feeding Self-Feeding Abilities: Needs set up Patient Positioning: Upright in bed Baseline Vocal Quality: Low vocal intensity Volitional Cough: Weak Volitional Swallow: Able to elicit    Oral/Motor/Sensory Function Overall Oral Motor/Sensory Function: Within functional limits   Ice Chips Ice chips: Within functional limits Presentation: Cup   Thin Liquid Thin Liquid: Impaired Presentation: Cup;Straw Oral Phase Impairments: Reduced lingual movement/coordination Oral Phase Functional Implications: Prolonged oral transit Pharyngeal  Phase Impairments: Suspected delayed Swallow;Multiple swallows;Throat Clearing - Immediate    Nectar Thick Nectar Thick Liquid: Not tested   Honey Thick Honey Thick Liquid: Not tested   Puree Puree: Within functional limits   Solid   GO   Solid: Impaired Oral Phase Impairments: Impaired mastication Oral Phase Functional Implications: Prolonged oral  transit Pharyngeal Phase Impairments: Multiple swallows       Arvil Chaco MA, CCC-SLP Acute Care Speech Language Pathologist    Levi Aland 04/28/2015,9:04 AM

## 2015-04-28 NOTE — H&P (Addendum)
[facility] History and Physical  [name] TDD:[id number] DOB: 1915-10-16 DOA: 04/28/2015  Referring physician: ED physician PCP: Cynthia Kinnier, DO  Specialists:   Chief Complaint: Fever, altered mental status and generalized weakness  HPI: [name] is a 82 y.o. female with PMH of lung cancer of adenocarcinoma (no surgery, chemotherapy or radiation therapy due to advanced age), left malignant pleural effusion, s/p of left chest tube placement on 04/13/15, hypertension, GERD, depression, anxiety, mitral valve prolapse, deafness, diastolic congestive heart failure, who presents with fever, altered mental status and generalized weakness.  Per patient's son, due to malignant pleural effusion, patient had left chest tube placed on 04/13/15 by cardiothoracic surgeon, Dr. Cyndia [name]. She was discharged to SNF. Per report, pt was noted to become confused, have generalized weakness and fever today. When I saw pt in ED, she is unresponsive. Her son said that she had been having some difficulty sleeping and was given a sedative medication early this morning, and has been sleeping all day since then. Patient is not able to give any history.  In ED, patient was found to have hypotension, positive urinalysis, WBC 14.7, temperature while 2.4, heart rate at 90s, tachypnea, potassium 5.3 without EKG change, acute renal injury with creatinine 1.49, lactate 0.65, INR 1.0, PTT 36, pro-calcitonin less than 0.10. Chest x-ray showed possible perihilar pneumonia with small amount of left pleural effusion  EKG: Independently reviewed. QTC 443, no ischemic change.  Where does patient live?   SNF  Can patient participate in ADLs?   None  Review of Systems: Could not be reviewed since patient is unresponsive  Allergy:  Allergies  Allergen Reactions  . Aleve [Naproxen Sodium] Swelling    Legs and feet  . Statins Other (See Comments)    myopathy    Past Medical History  Diagnosis Date  .  History of hiatal hernia   . Mitral valve prolapse   . Plantar fasciitis   . Hay fever   . GERD (gastroesophageal reflux disease)   . Palpitations   . Neuromuscular disorder ([place])   . Deafness     "very HOH; even w/hearing aides we have to communicate via writing things down for her to read" (08/25/2012)  . H/O hiatal hernia   . Arthritis     "a little all over" (08/25/2012)  . Lung mass     Hx: of  . Anxiety   . Hemorrhoids   . Neuropathy ([place])   . Hypertension   . Hyperglycemia     Past Surgical History  Procedure Laterality Date  . Foot surgery Bilateral ~ 2000    "pinched nerve on feet" (08/25/2012)  . Vesicovaginal fistula closure w/ tah    . Abdominal hysterectomy  1998  . Tonsillectomy and adenoidectomy  1940's  . Cesarean section  1952  . Knee arthroscopy w/ meniscectomy Left 07/15/2010    partial medial and lateral meniscectomies.Archie Endo 07/15/2010 (08/25/2012)  . Hip arthroplasty Right 08/26/2012    Procedure: ARTHROPLASTY BIPOLAR HIP;  Surgeon: Mauri Pole, MD;  Location: [facility];  Service: Orthopedics;  Laterality: Right;  . Open reduction internal fixation (orif) distal radial fracture Right 09/11/2012    Procedure: OPEN REDUCTION INTERNAL FIXATION (ORIF) RIGHT DISTAL RADIAL FRACTURE/ULNAR POSSIBLE BONE GRAFTING;  Surgeon: Linna Hoff, MD;  Location: [facility];  Service: Orthopedics;  Laterality: Right;  . Cataract extraction, bilateral Bilateral   . Laparoscopic cholecystectomy  03/19/2015  . Cholecystectomy N/A 03/19/2015    Procedure: LAPAROSCOPIC CHOLECYSTECTOMY WITH INTRAOPERATIVE CHOLANGIOGRAM;  Surgeon: Donnie Mesa, MD;  Location: [facility];  Service: General;  Laterality: N/A;  . Chest tube insertion Left 04/13/2015    Procedure: INSERTION PLEURAL DRAINAGE CATHETER;  Surgeon: [name], MD;  Location: [facility];  Service: Thoracic;  Laterality: Left;    Social History:  reports that she has never smoked. She has never used smokeless tobacco. She reports that she  does not drink alcohol or use illicit drugs.  Family History:  Family History  Problem Relation Age of Onset  . Colon cancer Mother   . Colon cancer Sister   . Cancer Sister     Breast  . Colon cancer Brother   . Heart disease Brother     Heart Attack  . Cancer Father     Leukemia  . Pneumonia Sister      Prior to Admission medications   Medication Sig Start Date End Date Taking? Authorizing Provider  acetaminophen (TYLENOL) 325 MG tablet Take 1-2 tablets (325-650 mg total) by mouth every 4 (four) hours as needed for mild pain. 03/27/15  Yes Nat Christen, PA-C  bisacodyl (DULCOLAX) 10 MG suppository Place 10 mg rectally as needed for moderate constipation.   Yes Historical Provider, MD  docusate sodium (COLACE) 100 MG capsule Take 1 capsule (100 mg total) by mouth 2 (two) times daily as needed for mild constipation. 03/27/15  Yes Nat Christen, PA-C  fluconazole (DIFLUCAN) 100 MG tablet Take 100 mg by mouth daily.   Yes Historical Provider, MD  furosemide (LASIX) 20 MG tablet Take 20 mg by mouth daily. 03/06/15  Yes Historical Provider, MD  KLOR-CON M20 20 MEQ tablet Take 40 mEq by mouth daily. 02/10/15  Yes Historical Provider, MD  lactulose (CHRONULAC) 10 GM/15ML solution Take 20 g by mouth daily as needed for mild constipation.   Yes Historical Provider, MD  losartan (COZAAR) 50 MG tablet TAKE 1 TABLET (50 MG TOTAL) BY MOUTH DAILY. 09/11/14  Yes Tiffany L Reed, DO  magnesium hydroxide (MILK OF MAGNESIA) 400 MG/5ML suspension Take 30 mLs by mouth daily as needed for mild constipation.   Yes Historical Provider, MD  metoprolol succinate (TOPROL-XL) 100 MG 24 hr tablet TAKE ONE TABLET BY MOUTH ONCE DAILY. Patient taking differently: TAKE 100 MG BY MOUTH ONCE DAILY 11/09/14  Yes Tiffany L Reed, DO  metroNIDAZOLE (METROCREAM) 0.75 % cream Apply 1 application topically daily. 03/08/15  Yes Historical Provider, MD  Multiple Vitamin (MULTIVITAMIN WITH MINERALS) TABS tablet Take 1 tablet by  mouth daily.   Yes Historical Provider, MD  oxyCODONE-acetaminophen (PERCOCET/ROXICET) 5-325 MG tablet Take 1 tablet by mouth every 6 (six) hours as needed for moderate pain. 03/27/15  Yes Nat Christen, PA-C  polyethylene glycol (MIRALAX / GLYCOLAX) packet Take 17 g by mouth 2 (two) times daily. 03/27/15  Yes Nat Christen, PA-C  Probiotic Product (PROBIOTIC DAILY PO) Take 1 tablet by mouth daily.   Yes Historical Provider, MD  promethazine (PHENERGAN) 25 MG/ML injection Inject 25 mg into the vein every 6 (six) hours as needed for nausea or vomiting.   Yes Historical Provider, MD  sertraline (ZOLOFT) 100 MG tablet Take 1 tablet (100 mg total) by mouth at bedtime. 03/12/15  Yes Tiffany L Reed, DO  Sodium Phosphates (RA SALINE ENEMA RE) Place 1 each rectally as needed (for constipation).   Yes Historical Provider, MD  UNABLE TO FIND Take 60 mLs by mouth 3 (three) times daily. Med Name: MedPass   Yes Historical Provider, MD  feeding supplement (BOOST / RESOURCE BREEZE) LIQD Take 1 Container by mouth 3 (three) times daily between meals. Patient not taking: Reported on 04/28/2015 03/27/15   Nat Christen, PA-C  pregabalin (LYRICA) 75 MG capsule Take 1 capsule (75 mg total) by mouth 3 (three) times daily. Patient not taking: Reported on 04/28/2015 03/12/15   Gayland Curry, DO    Physical Exam: Filed Vitals:   04/28/15 0400 04/28/15 0415 04/28/15 0430 04/28/15 0440  BP: '86/46 90/47 94/54 '$   Pulse: 76 74 75   Temp:    98.9 F (37.2 C)  TempSrc:    Oral  Resp: '11 14 12   '$ Height:      Weight:      SpO2: 100% 100% 100%    General: Not in acute distress HEENT:       Eyes: PERRL, no scleral icterus.       ENT: No discharge from the ears and nose, no pharynx injection, no tonsillar enlargement.        Neck: No JVD, no bruit, no mass felt. Heme: No neck lymph node enlargement. Cardiac: S1/S2, RRR, No murmurs, No gallops or rubs. Pulm:  Crackles bilaterally, No wheezing or rubs. Has left chest tube  in place. Abd: Soft, nondistended, no organomegaly, BS present. Ext: No pitting leg edema bilaterally. 2+DP/PT pulse bilaterally. Musculoskeletal: No joint deformities, No joint redness or warmth. Skin: No rashes.  Neuro: unresponsive, not moving extremities. Psych: Patient is not psychotic, no suicidal or hemocidal ideation.  Labs on Admission:  Basic Metabolic Panel:  Recent Labs Lab 04/28/15 0139  NA 138  K 5.3*  CL 103  CO2 27  GLUCOSE 130*  BUN 17  CREATININE 1.49*  CALCIUM 8.3*   Liver Function Tests:  Recent Labs Lab 04/28/15 0139  AST 13*  ALT 10*  ALKPHOS 59  BILITOT 0.3  PROT 5.1*  ALBUMIN 2.5*   No results for input(s): LIPASE, AMYLASE in the last 168 hours. No results for input(s): AMMONIA in the last 168 hours. CBC:  Recent Labs Lab 04/28/15 0139  WBC 14.7*  NEUTROABS 12.0*  HGB 11.3*  HCT 34.5*  MCV 91.8  PLT 224   Cardiac Enzymes: No results for input(s): CKTOTAL, CKMB, CKMBINDEX, TROPONINI in the last 168 hours.  BNP (last 3 results) No results for input(s): BNP in the last 8760 hours.  ProBNP (last 3 results) No results for input(s): PROBNP in the last 8760 hours.  CBG: No results for input(s): GLUCAP in the last 168 hours.  Radiological Exams on Admission: Dg Chest Port 1 View  04/28/2015  CLINICAL DATA:  Code sepsis. EXAM: PORTABLE CHEST 1 VIEW COMPARISON:  04/13/2015 FINDINGS: Shallow inspiration. Mild cardiac enlargement. Mild pulmonary vascular congestion. Mild perihilar infiltration may represent edema or pneumonia. Probable tiny left pleural effusion. No pneumothorax. Calcified and ectatic aorta. Surgical clips in the right upper quadrant. IMPRESSION: Cardiac enlargement with mild pulmonary vascular congestion. Perihilar infiltrates may represent edema or pneumonia. Tiny left pleural effusion. Electronically Signed   By: Lucienne Capers M.D.   On: 04/28/2015 01:55    Assessment/Plan Principal Problem:   Septic shock  (HCC) Active Problems:   Essential hypertension, benign   GERD   Hearing loss   Pleural effusion, left   Hypotension   HCAP (healthcare-associated pneumonia)   UTI (lower urinary tract infection)   Cancer of left lung (HCC)   AKI (acute kidney injury) ([place])   Lung cancer (HCC)   Acute encephalopathy   Hyperkalemia  Chronic diastolic congestive heart failure ([place])   Septic shock [facility]): Patient in in septic shock, possibly due to HCAP and UTI. EDP physician, Dr. Roxanne Mins discussed with PCCM. Per Dr. Iven Finn, no vasopressors should be started given patient's advanced age and DO NOT RESUSCITATE. I dicussed with pt's son, [name] about current situation He fully understands that pt's prognosis is very poor. He agreed that we should not start vasopressor, and treat pt with antibiotics plus aggressive IV fluid now. If patient deteriorates, will go for fomfort care.  -will admit to tele bed -will get Procalcitonin and trend lactic acid levels per sepsis protocol. -IVF: 3.0L of NS bolus in ED, followed by 125 cc/h.  -IV vancomycin and cefepime  HCAP and sepsis secondary to HCAP: Patient's fever, leukocytosis plusx-ray findings are consistent with HCAP.  - IV Vancomycin and cefepime - Albuterol Neb prn for SOB - Urine legionella and S. pneumococcal antigen - Follow up blood culture x2, sputum culture, plus Flu pcr - NPO and SLP   UTI: Patient has positive urinalysis with large amount of leukocytes. -On IV antibiotics as above -Follow-up urine culture  Acute encephalopathy: Likely multifactorial, including sepsis, acute renal injury, HCAP, UTI and electrolyte disturbance. -Treat underlying problems -Frequent neuro check  Lung cancer and malignant pleural effusion: S/p of left chest tube placement. Due to advanced age, no surgery, RXT or chemotherapy were given. Currently pleural effusion is managed by Dr. Cyndia [name]. Last seen was on 04/25/15. She is doing daily drainage.  -sent  out pleural fluid culture by EDP  AKI: Likely due to ATN and prerenal secondary to dehydration and continuation of ARB, diruetics - IVF as above - Check FeUrea - Follow up renal function by BMP - Hold Lasix and Cozaar  Hypertension: Now presents with septic shock -Hold oral medications  GERD: -Protonix IV  Depression and anxiety: -Hold oral medications since patient is unresponsive.  Hyperkalemia: Potassium 5.3 without EKG 10. This is likely due to AKI. -Kayexalate 30 g 1 -IV fluid as above -Follow-up by BMP  Diastolic congestive heart failure: 2-D echo on 03/16/15 showed EF of 65-70 percent with grade 1 diastolic dysfunction. Patient is on Lasix at home. No leg edema, CHF is compensated. -Hold Lasix due to septic shock -Check BNP   DVT ppx: SQ Lovenox  Code Status: DNR Family Communication:  Yes, patient's  son  at bed side Disposition Plan: Admit to inpatient   Date of Service 04/28/2015    Ivor Costa [facility] Pager [phone]  If 7PM-7AM, please contact night-coverage [email or web address] Password [facility] 04/28/2015, 5:26 AM

## 2015-04-28 NOTE — Progress Notes (Signed)
ANTIBIOTIC CONSULT NOTE - INITIAL  Pharmacy Consult for Vancomycin, Cefepime Indication: rule out sepsis  Allergies  Allergen Reactions  . Aleve [Naproxen Sodium] Swelling    Legs and feet  . Statins Other (See Comments)    myopathy    Patient Measurements: Height: '5\' 6"'$  (167.6 cm) Weight: 175 lb (79.379 kg) IBW/kg (Calculated) : 59.3  Vital Signs: Temp: 102.4 F (39.1 C) (01/28 0059) Temp Source: Rectal (01/28 0059) BP: 86/46 mmHg (01/28 0400) Pulse Rate: 76 (01/28 0400) Intake/Output from previous day: 01/27 0701 - 01/28 0700 In: 2750 [I.V.:2750] Out: -  Intake/Output from this shift: Total I/O In: 2750 [I.V.:2750] Out: -   Labs:  Recent Labs  04/28/15 0139  WBC 14.7*  HGB 11.3*  PLT 224  CREATININE 1.49*   Estimated Creatinine Clearance: 22.9 mL/min (by C-G formula based on Cr of 1.49). No results for input(s): VANCOTROUGH, VANCOPEAK, VANCORANDOM, GENTTROUGH, GENTPEAK, GENTRANDOM, TOBRATROUGH, TOBRAPEAK, TOBRARND, AMIKACINPEAK, AMIKACINTROU, AMIKACIN in the last 72 hours.   Microbiology: Recent Results (from the past 720 hour(s))  Gram stain     Status: None   Collection Time: 04/28/15  1:30 AM  Result Value Ref Range Status   Specimen Description PLEURAL LEFT FLUID  Final   Special Requests NONE  Final   Gram Stain   Final    FEW WBC PRESENT, PREDOMINANTLY MONONUCLEAR NO ORGANISMS SEEN    Report Status 04/28/2015 FINAL  Final  Culture, body fluid-bottle     Status: None (Preliminary result)   Collection Time: 04/28/15  1:30 AM  Result Value Ref Range Status   Specimen Description FLUID  Final   Special Requests PLEURAL AEROBIC BOTTLE ONLY  Final   Culture PENDING  Incomplete   Report Status PENDING  Incomplete    Medical History: Past Medical History  Diagnosis Date  . History of hiatal hernia   . Mitral valve prolapse   . Plantar fasciitis   . Hay fever   . GERD (gastroesophageal reflux disease)   . Palpitations   . Neuromuscular  disorder ([place])   . Deafness     "very HOH; even w/hearing aides we have to communicate via writing things down for her to read" (08/25/2012)  . H/O hiatal hernia   . Arthritis     "a little all over" (08/25/2012)  . Lung mass     Hx: of  . Anxiety   . Hemorrhoids   . Neuropathy ([place])   . Hypertension   . Hyperglycemia     Assessment: 82 year old female admitted with AMS. Recent pleural X catheter on 1/13 for a recurrent malignant left pleural effusion. Pharmacy to dose Cefepime and Vancomycin  Goal of Therapy:  Vancomycin trough level 15-20 mcg/ml  Appropriate Cefepime dosing  Plan:  Cefepime 2 grams iv Q 24 hours Vancomycin 1 gram iv Q 48 hours Follow up Scr, cultures, fever trend  Thank you Anette Guarneri, PharmD [phone]  04/28/2015,4:11 AM

## 2015-04-28 NOTE — Progress Notes (Signed)
[facility] Progress Note   [name]  IRJ:[id number]  DOB: 20-Sep-1915  DOA: 04/28/2015 PCP: Hollace Kinnier, DO  Brief narrative: [name] is a 82 y.o. female with PMH of lung cancer of adenocarcinoma (no surgery, chemotherapy or radiation therapy due to advanced age), left malignant pleural effusion, s/p of left chest tube placement on 04/13/15, hypertension, GERD, depression, anxiety, mitral valve prolapse, deafness, diastolic congestive heart failure, who presents with fever, altered mental status and generalized weakness.   Subjective: Quite alert- very hard of hearing. No c/o cough, nausea, vomiting, pain.   Assessment/Plan: Principal Problem:   Septic shock / Acute encephalopathy/ UTI - likely due to UTI but also noted to have peri-hilar infiltrates on CXR - d/c Vanc and MRSA PCR neg - cont Zosyn and follow cultures- unable to get strep/legionella antigens as she is incontinent  - fever resolved, no longer confused  - influenza negative   Active Problems:   Essential hypertension, benign - Toprol, Losartan and Lasix on hold  Left lung cancer and malignant pl effusion - has pleurex  AKI - prerenal? -  cont to hydrate but cut fluids down to 75 cc/hr -  incontinent and therefore unable to get accurate I and O    Hyperkalemia - resolved    Chronic diastolic congestive heart failure - hydrating - follow for hypoxia    Antibiotics: Anti-infectives    Start     Dose/Rate Route Frequency Ordered Stop   05/01/15 0300  vancomycin (VANCOCIN) IVPB 1000 mg/200 mL premix  Status:  Discontinued     1,000 mg 200 mL/hr over 60 Minutes Intravenous Every 48 hours 04/28/15 0416 04/28/15 1312   04/29/15 0300  ceFEPIme (MAXIPIME) 2 g in dextrose 5 % 50 mL IVPB     2 g 100 mL/hr over 30 Minutes Intravenous Every 24 hours 04/28/15 0416     04/28/15 0600  ceFEPIme (MAXIPIME) 1 g in dextrose 5 % 50 mL IVPB  Status:  Discontinued     1 g 100 mL/hr over 30  Minutes Intravenous 3 times per day 04/28/15 0408 04/28/15 0414   04/28/15 0215  vancomycin (VANCOCIN) IVPB 1000 mg/200 mL premix     1,000 mg 200 mL/hr over 60 Minutes Intravenous  Once 04/28/15 0203 04/28/15 0322   04/28/15 0215  ceFEPIme (MAXIPIME) 2 g in dextrose 5 % 50 mL IVPB     2 g 100 mL/hr over 30 Minutes Intravenous  Once 04/28/15 0205 04/28/15 3016     Code Status:     Code Status Orders        Start     Ordered   04/28/15 0407  Do not attempt resuscitation (DNR)   Continuous    Question Answer Comment  In the event of cardiac or respiratory ARREST Do not call a "code blue"   In the event of cardiac or respiratory ARREST Do not perform Intubation, CPR, defibrillation or ACLS   In the event of cardiac or respiratory ARREST Use medication by any route, position, wound care, and other measures to relive pain and suffering. May use oxygen, suction and manual treatment of airway obstruction as needed for comfort.      04/28/15 0408    Code Status History    Date Active Date Inactive Code Status Order ID Comments User Context   03/19/2015  4:41 PM 03/27/2015  6:11 PM DNR [id number]  Nat Christen, PA-C Inpatient   03/19/2015  4:35 PM 03/19/2015  4:41 PM Full  Code [id number]  Donnie Mesa, MD Inpatient   03/16/2015  4:26 PM 03/19/2015  4:35 PM DNR [id number]  Melton Alar, PA-C Inpatient   03/16/2015 10:09 AM 03/16/2015  4:26 PM Full Code [id number]  Earnstine Regal, PA-C Inpatient   08/25/2012  3:57 PM 08/30/2012  9:32 PM DNR [id number]  Orson Eva, MD Inpatient    Advance Directive Documentation        Most Recent Value   Type of Advance Directive  Living will, Out of facility DNR (pink MOST or yellow form)   Pre-existing out of facility DNR order (yellow form or pink MOST form)     "MOST" Form in Place?       Family Communication:  Disposition Plan: return to SNF in 1-2 days DVT prophylaxis: Lovenox Consultants:  Procedures:     Objective: Filed Weights   04/28/15  0059  Weight: 79.379 kg (175 lb)    Intake/Output Summary (Last 24 hours) at 04/28/15 1312 Last data filed at 04/28/15 0900  Gross per 24 hour  Intake   2990 ml  Output      0 ml  Net   2990 ml     Vitals Filed Vitals:   04/28/15 0440 04/28/15 0505 04/28/15 0708 04/28/15 1125  BP:  1'06/61 81/51 97/51 '$  Pulse:  78 78 78  Temp: 98.9 F (37.2 C)  97.7 F (36.5 C)   TempSrc: Oral  Oral   Resp:  '17 11 15  '$ Height:      Weight:      SpO2:  90% 99% 95%    Exam:  General:  Pt is alert, not in acute distress  HEENT: No icterus, No thrush, oral mucosa moist  Cardiovascular: regular rate and rhythm, S1/S2 No murmur  Respiratory: clear to auscultation bilaterally   Abdomen: Soft, +Bowel sounds, non tender, non distended, no guarding  MSK: No cyanosis or clubbing- no pedal edema   Data Reviewed: Basic Metabolic Panel:  Recent Labs Lab 04/28/15 0139  NA 138  K 5.3*  CL 103  CO2 27  GLUCOSE 130*  BUN 17  CREATININE 1.49*  CALCIUM 8.3*   Liver Function Tests:  Recent Labs Lab 04/28/15 0139  AST 13*  ALT 10*  ALKPHOS 59  BILITOT 0.3  PROT 5.1*  ALBUMIN 2.5*   No results for input(s): LIPASE, AMYLASE in the last 168 hours. No results for input(s): AMMONIA in the last 168 hours. CBC:  Recent Labs Lab 04/28/15 0139  WBC 14.7*  NEUTROABS 12.0*  HGB 11.3*  HCT 34.5*  MCV 91.8  PLT 224   Cardiac Enzymes: No results for input(s): CKTOTAL, CKMB, CKMBINDEX, TROPONINI in the last 168 hours. BNP (last 3 results)  Recent Labs  04/28/15 0450  BNP 54.5    ProBNP (last 3 results) No results for input(s): PROBNP in the last 8760 hours.  CBG: No results for input(s): GLUCAP in the last 168 hours.  Recent Results (from the past 240 hour(s))  Gram stain     Status: None   Collection Time: 04/28/15  1:30 AM  Result Value Ref Range Status   Specimen Description PLEURAL LEFT FLUID  Final   Special Requests NONE  Final   Gram Stain   Final    FEW WBC  PRESENT, PREDOMINANTLY MONONUCLEAR NO ORGANISMS SEEN    Report Status 04/28/2015 FINAL  Final  Culture, body fluid-bottle     Status: None (Preliminary result)   Collection Time: 04/28/15  1:30 AM  Result Value  Ref Range Status   Specimen Description FLUID  Final   Special Requests PLEURAL AEROBIC BOTTLE ONLY  Final   Culture PENDING  Incomplete   Report Status PENDING  Incomplete  MRSA PCR Screening     Status: None   Collection Time: 04/28/15  5:00 AM  Result Value Ref Range Status   MRSA by PCR NEGATIVE NEGATIVE Final    Comment:        The GeneXpert MRSA Assay (FDA approved for NASAL specimens only), is one component of a comprehensive MRSA colonization surveillance program. It is not intended to diagnose MRSA infection nor to guide or monitor treatment for MRSA infections.      Studies: Dg Chest Port 1 View  04/28/2015  CLINICAL DATA:  Code sepsis. EXAM: PORTABLE CHEST 1 VIEW COMPARISON:  04/13/2015 FINDINGS: Shallow inspiration. Mild cardiac enlargement. Mild pulmonary vascular congestion. Mild perihilar infiltration may represent edema or pneumonia. Probable tiny left pleural effusion. No pneumothorax. Calcified and ectatic aorta. Surgical clips in the right upper quadrant. IMPRESSION: Cardiac enlargement with mild pulmonary vascular congestion. Perihilar infiltrates may represent edema or pneumonia. Tiny left pleural effusion. Electronically Signed   By: Lucienne Capers M.D.   On: 04/28/2015 01:55    Scheduled Meds:  Scheduled Meds: . [START ON 04/29/2015] ceFEPime (MAXIPIME) IV  2 g Intravenous Q24H  . enoxaparin (LOVENOX) injection  30 mg Subcutaneous Q24H  . metroNIDAZOLE  1 application Topical QHS  . pantoprazole (PROTONIX) IV  40 mg Intravenous Q24H   Continuous Infusions: . sodium chloride 1,000 mL (04/28/15 0945)    Time spent on care of this patient: 65 min   [place], MD 04/28/2015, 1:12 PM  LOS: 0 days   [facility] Office   [phone] Pager - Text Page per [email or web address] If 7PM-7AM, please contact night-coverage [email or web address]

## 2015-04-28 NOTE — Progress Notes (Signed)
Pt transferred to 3s10 from ED after report received.  Pt lethargic, able to follow simple commands with prompting, extremely HOH.  Son is bedside.  Pt has call bell within reach and bed alarm is on.  Will continue to monitor.

## 2015-04-28 NOTE — Progress Notes (Signed)
Pt complaining that telemetry electrodes are hurting her and itching, pt is alert and oriented. Notified Dr Wynelle Cleveland and she gave verbal order to take pt off telemetry. Consuelo Pandy RN

## 2015-04-29 ENCOUNTER — Inpatient Hospital Stay (HOSPITAL_COMMUNITY): Payer: Medicare Other

## 2015-04-29 DIAGNOSIS — I951 Orthostatic hypotension: Secondary | ICD-10-CM

## 2015-04-29 LAB — BASIC METABOLIC PANEL
ANION GAP: 8 (ref 5–15)
BUN: 14 mg/dL (ref 6–20)
CHLORIDE: 102 mmol/L (ref 101–111)
CO2: 24 mmol/L (ref 22–32)
Calcium: 8.2 mg/dL — ABNORMAL LOW (ref 8.9–10.3)
Creatinine, Ser: 0.75 mg/dL (ref 0.44–1.00)
GFR calc Af Amer: >60 mL/min (ref >60)
GLUCOSE: 125 mg/dL — AB (ref 65–99)
POTASSIUM: 4.1 mmol/L (ref 3.5–5.1)
SODIUM: 134 mmol/L — AB (ref 135–145)

## 2015-04-29 LAB — CBC
HEMATOCRIT: 37.8 % (ref 36.0–46.0)
HEMOGLOBIN: 12 g/dL (ref 12.0–15.0)
MCH: 29.1 pg (ref 26.0–34.0)
MCHC: 31.7 g/dL (ref 30.0–36.0)
MCV: 91.7 fL (ref 78.0–100.0)
Platelets: 245 10*3/uL (ref 150–400)
RBC: 4.12 MIL/uL (ref 3.87–5.11)
RDW: 13.5 % (ref 11.5–15.5)
WBC: 13.7 10*3/uL — AB (ref 4.0–10.5)

## 2015-04-29 LAB — STREP PNEUMONIAE URINARY ANTIGEN: Strep Pneumo Urinary Antigen: NEGATIVE

## 2015-04-29 LAB — CREATININE, URINE, RANDOM: Creatinine, Urine: 57.11 mg/dL

## 2015-04-29 MED ORDER — METOPROLOL SUCCINATE ER 50 MG PO TB24
100.0000 mg | ORAL_TABLET | Freq: Every day | ORAL | Status: DC
  Administered 2015-04-29 – 2015-04-30 (×2): 100 mg via ORAL
  Filled 2015-04-29 (×3): qty 2

## 2015-04-29 MED ORDER — PHENAZOPYRIDINE HCL 100 MG PO TABS
100.0000 mg | ORAL_TABLET | Freq: Three times a day (TID) | ORAL | Status: AC
  Administered 2015-04-29 (×3): 100 mg via ORAL
  Filled 2015-04-29 (×3): qty 1

## 2015-04-29 MED ORDER — HYDRALAZINE HCL 20 MG/ML IJ SOLN
5.0000 mg | Freq: Four times a day (QID) | INTRAMUSCULAR | Status: DC | PRN
  Administered 2015-04-30: 5 mg via INTRAVENOUS
  Filled 2015-04-29: qty 1

## 2015-04-29 MED ORDER — DOCUSATE SODIUM 100 MG PO CAPS: 100.0000 mg | ORAL_CAPSULE | Freq: Two times a day (BID) | ORAL | Status: DC | PRN

## 2015-04-29 MED ORDER — MORPHINE SULFATE (PF) 2 MG/ML IV SOLN
2.0000 mg | INTRAVENOUS | Status: DC | PRN
Start: 2015-04-29 — End: 2015-04-29
  Administered 2015-04-29: 4 mg via INTRAVENOUS
  Filled 2015-04-29: qty 2

## 2015-04-29 MED ORDER — ONDANSETRON HCL 4 MG/2ML IJ SOLN
4.0000 mg | Freq: Four times a day (QID) | INTRAMUSCULAR | Status: DC | PRN
  Administered 2015-04-29 – 2015-04-30 (×5): 4 mg via INTRAVENOUS
  Filled 2015-04-29 (×5): qty 2

## 2015-04-29 MED ORDER — METRONIDAZOLE 0.75 % EX GEL
Freq: Every day | CUTANEOUS | Status: DC
  Administered 2015-04-29: 21:00:00 via TOPICAL
  Filled 2015-04-29: qty 45

## 2015-04-29 MED ORDER — SERTRALINE HCL 100 MG PO TABS
100.0000 mg | ORAL_TABLET | Freq: Every day | ORAL | Status: DC
  Administered 2015-04-29: 100 mg via ORAL
  Filled 2015-04-29: qty 1

## 2015-04-29 MED ORDER — BISACODYL 10 MG RE SUPP: 10.0000 mg | RECTAL | Status: DC | PRN

## 2015-04-29 MED ORDER — HYDROMORPHONE HCL 1 MG/ML IJ SOLN
0.5000 mg | INTRAMUSCULAR | Status: DC | PRN
  Administered 2015-04-29 – 2015-04-30 (×4): 1 mg via INTRAVENOUS
  Filled 2015-04-29 (×5): qty 1

## 2015-04-29 MED ORDER — LORAZEPAM 0.5 MG PO TABS
0.5000 mg | ORAL_TABLET | Freq: Once | ORAL | Status: AC
  Administered 2015-04-29: 0.5 mg via ORAL
  Filled 2015-04-29: qty 1

## 2015-04-29 MED ORDER — SACCHAROMYCES BOULARDII 250 MG PO CAPS
250.0000 mg | ORAL_CAPSULE | Freq: Two times a day (BID) | ORAL | Status: DC
  Administered 2015-04-29 – 2015-04-30 (×3): 250 mg via ORAL
  Filled 2015-04-29 (×3): qty 1

## 2015-04-29 MED ORDER — BOOST / RESOURCE BREEZE PO LIQD
1.0000 | Freq: Three times a day (TID) | ORAL | Status: DC
  Administered 2015-04-30: 1 via ORAL

## 2015-04-29 NOTE — Progress Notes (Signed)
Patient had many complaints of pain through the night many calls made to MD for changes however little relief per patient she also c/o burning with urination pyridium ordered and given. Still unable to collect urine due incontinence.  Clarification needed for any orders for her pleurex cath to left chest. I will continue to monitor pain control patient continues to be non-tele due to refuse monitor MD aware.

## 2015-04-29 NOTE — Evaluation (Signed)
Physical Therapy Evaluation Patient Details Name: [name] MRN: [id number] DOB: [birth year] Today's Date: 04/29/2015   History of Present Illness  [name] is a 82 y.o. female with PMH of lung cancer of adenocarcinoma (no surgery, chemotherapy or radiation therapy due to advanced age), left malignant pleural effusion, s/p of left chest tube placement on 04/13/15, hypertension, GERD, depression, anxiety, mitral valve prolapse, deafness, diastolic congestive heart failure, who presents with fever, altered mental status and generalized weakness. Dx: septic shock/acute encephalopathy/UTI.   Clinical Impression  Pt admitted with above diagnosis. Pt currently with functional limitations due to the deficits listed below (see PT Problem List). [name] was very agitated and not cooperative w/ PT this session.  PT provided mod assist for boost from [name] and min assist for stand pivot.  Pt is deaf and requires writing on white board (in room) for communication. Pt will benefit from skilled PT to increase their independence and safety with mobility to allow discharge to the venue listed below.      Follow Up Recommendations SNF;Supervision/Assistance - 24 hour    Equipment Recommendations  Other (comment) (TBD at next venue of care)    Recommendations for Other Services       Precautions / Restrictions Precautions Precautions: Fall Restrictions Weight Bearing Restrictions: No      Mobility  Bed Mobility Overal bed mobility: Needs Assistance Bed Mobility: Supine to Sit;Sit to Supine     Supine to sit: Min guard Sit to supine: Min guard   General bed mobility comments: Increased time and cues to scoot to EOB w/ supine>sit.  Transfers Overall transfer level: Needs assistance Equipment used: None Transfers: Sit to/from [name] Sit to Stand: Mod assist Stand pivot transfers: Min assist       General transfer comment: Mod assist to boost from [name]  and min assist to steady during stand pivot as pt w/ severe trunk flexion.  Pt refusing to transfer to recliner chair and threatens to hit/kick therapist if she is not assisted back to bed.  Pt reaching out and holding onto either bed or BSC armrest during stand pivot transfer.  Ambulation/Gait                Stairs            Wheelchair Mobility    Modified Rankin (Stroke Patients Only)       Balance Overall balance assessment: Needs assistance Sitting-balance support: Bilateral upper extremity supported;Feet supported Sitting balance-[name]: Fair     Standing balance support: Single extremity supported;During functional activity                                 Pertinent Vitals/Pain Pain Assessment: Faces Faces Pain [name]: Hurts even more Pain Location: stomach Pain Descriptors / Indicators: Aching;Constant;Moaning Pain Intervention(s): Limited activity within patient's tolerance;Monitored during session    Home Living Family/patient expects to be discharged to:: Skilled nursing facility                 Additional Comments: Pt is from [name]    Prior Function Level of Independence: Needs assistance   Gait / Transfers Assistance Needed: RN reports pt's son says pt has been walking w/ RW  ADL's / Homemaking Assistance Needed: sponge bath mainly sometimes sits on seat in shower (this information from previous note as pt is poor historian)        Hand Dominance  Dominant Hand: Right    Extremity/Trunk Assessment   Upper Extremity Assessment: Generalized weakness           Lower Extremity Assessment: Generalized weakness      Cervical / Trunk Assessment: Kyphotic  Communication   Communication: HOH;Deaf  Cognition Arousal/Alertness: Awake/alert Behavior During Therapy: Agitated Overall Cognitive Status: Difficult to assess                      General Comments General comments (skin integrity, edema,  etc.): Pt very agitated and repeating, "I can't work w/ thearpy, I'm passing", and "I'm going to kick you if you try to make me do anything"    Exercises        Assessment/Plan    PT Assessment Patient needs continued PT services  PT Diagnosis Difficulty walking;Generalized weakness;Acute pain   PT Problem List Decreased strength;Decreased range of motion;Decreased activity tolerance;Decreased balance;Decreased mobility;Decreased knowledge of use of DME;Decreased safety awareness;Pain  PT Treatment Interventions DME instruction;Gait training;Functional mobility training;Therapeutic activities;Therapeutic exercise;Balance training;Neuromuscular re-education;Patient/family education   PT Goals (Current goals can be found in the Care Plan section) Acute Rehab PT Goals Patient Stated Goal: none stated PT Goal Formulation: With patient Time For Goal Achievement: 05/13/15 Potential to Achieve Goals: Fair    Frequency Min 3X/week   Barriers to discharge        Co-evaluation               End of Session   Activity Tolerance: Treatment limited secondary to agitation Patient left: in bed;with call bell/phone within reach;with bed alarm set;with nursing/sitter in room Nurse Communication: Mobility status;Other (comment) (RN in room throughout PT session)         Time: 0786-7544 PT Time Calculation (min) (ACUTE ONLY): 12 min   Charges:   PT Evaluation $PT Eval Moderate Complexity: 1 Procedure     PT G Codes:       [name] PT, DPT [phone] Pager: [phone] 04/29/2015, 9:58 AM

## 2015-04-29 NOTE — Progress Notes (Signed)
[facility] Progress Note   [name]  NUU:[id number]  DOB: 02/28/1916  DOA: 04/28/2015 PCP: Hollace Kinnier, DO  Brief narrative: [name] is a 82 y.o. female with PMH of lung cancer of adenocarcinoma (no surgery, chemotherapy or radiation therapy due to advanced age), left malignant pleural effusion, s/p of left chest tube placement on 04/13/15, hypertension, GERD, depression, anxiety, mitral valve prolapse, deafness, diastolic congestive heart failure, who presents with fever, altered mental status and generalized weakness.   Subjective: Slightly confused today-  No c/o cough, nausea, vomiting, pain.   Assessment/Plan: Principal Problem:   Septic shock / Acute encephalopathy/ UTI - likely due to UTI but also noted to have peri-hilar infiltrates on CXR - d/c Vanc as MRSA PCR neg - cont Cefepime and follow cultures- unable to get strep/legionella antigens as she is incontinent  - fever resolved - mild confusion persists (underlying dementia with exacerbation in hospital?) - influenza negative   Active Problems:   Essential hypertension, benign - Toprol, Losartan and Lasix held due to hypotension on admission - will resume Toprol today  Tachycardia - possibly due to B Blocker withdraw- resuming Toprol  Left lung cancer and malignant pl effusion - has pleurex  AKI - prerenal?- resolved with hydration- will d/c IV fluids today  -  incontinent and therefore unable to get accurate I and O    Hyperkalemia - resolved    Chronic diastolic congestive heart failure - gr 1 diastolic CHF - lasix on hold due to ARF and hypotension on admission - follow for hypoxia    Antibiotics: Anti-infectives    Start     Dose/Rate Route Frequency Ordered Stop   05/01/15 0300  vancomycin (VANCOCIN) IVPB 1000 mg/200 mL premix  Status:  Discontinued     1,000 mg 200 mL/hr over 60 Minutes Intravenous Every 48 hours 04/28/15 0416 04/28/15 1312   04/29/15 0300  ceFEPIme  (MAXIPIME) 2 g in dextrose 5 % 50 mL IVPB     2 g 100 mL/hr over 30 Minutes Intravenous Every 24 hours 04/28/15 0416     04/28/15 0600  ceFEPIme (MAXIPIME) 1 g in dextrose 5 % 50 mL IVPB  Status:  Discontinued     1 g 100 mL/hr over 30 Minutes Intravenous 3 times per day 04/28/15 0408 04/28/15 0414   04/28/15 0215  vancomycin (VANCOCIN) IVPB 1000 mg/200 mL premix     1,000 mg 200 mL/hr over 60 Minutes Intravenous  Once 04/28/15 0203 04/28/15 0322   04/28/15 0215  ceFEPIme (MAXIPIME) 2 g in dextrose 5 % 50 mL IVPB     2 g 100 mL/hr over 30 Minutes Intravenous  Once 04/28/15 0205 04/28/15 3474     Code Status:     Code Status Orders        Start     Ordered   04/28/15 0407  Do not attempt resuscitation (DNR)   Continuous    Question Answer Comment  In the event of cardiac or respiratory ARREST Do not call a "code blue"   In the event of cardiac or respiratory ARREST Do not perform Intubation, CPR, defibrillation or ACLS   In the event of cardiac or respiratory ARREST Use medication by any route, position, wound care, and other measures to relive pain and suffering. May use oxygen, suction and manual treatment of airway obstruction as needed for comfort.      04/28/15 0408    Code Status History    Date Active Date Inactive Code  Status Order ID Comments User Context   03/19/2015  4:41 PM 03/27/2015  6:11 PM DNR [id number]  Nat Christen, PA-C Inpatient   03/19/2015  4:35 PM 03/19/2015  4:41 PM Full Code [id number]  Donnie Mesa, MD Inpatient   03/16/2015  4:26 PM 03/19/2015  4:35 PM DNR [id number]  Melton Alar, PA-C Inpatient   03/16/2015 10:09 AM 03/16/2015  4:26 PM Full Code [id number]  Earnstine Regal, PA-C Inpatient   08/25/2012  3:57 PM 08/30/2012  9:32 PM DNR [id number]  Orson Eva, MD Inpatient    Advance Directive Documentation        Most Recent Value   Type of Advance Directive  Living will, Out of facility DNR (pink MOST or yellow form)   Pre-existing out of facility DNR  order (yellow form or pink MOST form)     "MOST" Form in Place?       Family Communication:  Disposition Plan: return to SNF in 1-2 days DVT prophylaxis: Lovenox Consultants:  Procedures:     Objective: Filed Weights   04/28/15 0059  Weight: 79.379 kg (175 lb)    Intake/Output Summary (Last 24 hours) at 04/29/15 1323 Last data filed at 04/29/15 1200  Gross per 24 hour  Intake   1340 ml  Output    300 ml  Net   1040 ml     Vitals Filed Vitals:   04/29/15 0454 04/29/15 0749 04/29/15 0900 04/29/15 1145  BP: 150/87 162/85  159/87  Pulse: 96 108 111 112  Temp: 98.6 F (37 C) 98.2 F (36.8 C)    TempSrc: Oral Oral    Resp: '19 18 17 21  '$ Height:      Weight:      SpO2: 95% 91% 88% 97%    Exam:  General:  Pt is alert, not in acute distress  HEENT: No icterus, No thrush, oral mucosa moist  Cardiovascular: regular rate and rhythm, S1/S2 No murmur  Respiratory: clear to auscultation bilaterally   Abdomen: Soft, +Bowel sounds, non tender, non distended, no guarding  MSK: No cyanosis or clubbing- no pedal edema   Data Reviewed: Basic Metabolic Panel:  Recent Labs Lab 04/28/15 0139 04/29/15 0350  NA 138 134*  K 5.3* 4.1  CL 103 102  CO2 27 24  GLUCOSE 130* 125*  BUN 17 14  CREATININE 1.49* 0.75  CALCIUM 8.3* 8.2*   Liver Function Tests:  Recent Labs Lab 04/28/15 0139  AST 13*  ALT 10*  ALKPHOS 59  BILITOT 0.3  PROT 5.1*  ALBUMIN 2.5*   No results for input(s): LIPASE, AMYLASE in the last 168 hours. No results for input(s): AMMONIA in the last 168 hours. CBC:  Recent Labs Lab 04/28/15 0139 04/29/15 0350  WBC 14.7* 13.7*  NEUTROABS 12.0*  --   HGB 11.3* 12.0  HCT 34.5* 37.8  MCV 91.8 91.7  PLT 224 245   Cardiac Enzymes: No results for input(s): CKTOTAL, CKMB, CKMBINDEX, TROPONINI in the last 168 hours. BNP (last 3 results)  Recent Labs  04/28/15 0450  BNP 54.5    ProBNP (last 3 results) No results for input(s): PROBNP in the  last 8760 hours.  CBG: No results for input(s): GLUCAP in the last 168 hours.  Recent Results (from the past 240 hour(s))  Gram stain     Status: None   Collection Time: 04/28/15  1:30 AM  Result Value Ref Range Status   Specimen Description PLEURAL LEFT FLUID  Final   Special Requests  NONE  Final   Gram Stain   Final    FEW WBC PRESENT, PREDOMINANTLY MONONUCLEAR NO ORGANISMS SEEN    Report Status 04/28/2015 FINAL  Final  Culture, body fluid-bottle     Status: None (Preliminary result)   Collection Time: 04/28/15  1:30 AM  Result Value Ref Range Status   Specimen Description FLUID  Final   Special Requests PLEURAL AEROBIC BOTTLE ONLY  Final   Culture PENDING  Incomplete   Report Status PENDING  Incomplete  MRSA PCR Screening     Status: None   Collection Time: 04/28/15  5:00 AM  Result Value Ref Range Status   MRSA by PCR NEGATIVE NEGATIVE Final    Comment:        The GeneXpert MRSA Assay (FDA approved for NASAL specimens only), is one component of a comprehensive MRSA colonization surveillance program. It is not intended to diagnose MRSA infection nor to guide or monitor treatment for MRSA infections.      Studies: Dg Chest Port 1 View  04/29/2015  CLINICAL DATA:  Cough EXAM: PORTABLE CHEST 1 VIEW COMPARISON:  04/28/2015 FINDINGS: Cardiac shadow is stable. The lungs are well aerated bilaterally. Mild perihilar changes are again seen and stable. No new focal infiltrate is seen no effusions are noted. IMPRESSION: Stable perihilar infiltrates. Electronically Signed   By: Inez Catalina M.D.   On: 04/29/2015 07:44   Dg Chest Port 1 View  04/28/2015  CLINICAL DATA:  Code sepsis. EXAM: PORTABLE CHEST 1 VIEW COMPARISON:  04/13/2015 FINDINGS: Shallow inspiration. Mild cardiac enlargement. Mild pulmonary vascular congestion. Mild perihilar infiltration may represent edema or pneumonia. Probable tiny left pleural effusion. No pneumothorax. Calcified and ectatic aorta. Surgical clips  in the right upper quadrant. IMPRESSION: Cardiac enlargement with mild pulmonary vascular congestion. Perihilar infiltrates may represent edema or pneumonia. Tiny left pleural effusion. Electronically Signed   By: Lucienne Capers M.D.   On: 04/28/2015 01:55    Scheduled Meds:  Scheduled Meds: . ceFEPime (MAXIPIME) IV  2 g Intravenous Q24H  . enoxaparin (LOVENOX) injection  30 mg Subcutaneous Q24H  . metroNIDAZOLE  1 application Topical QHS  . pantoprazole (PROTONIX) IV  40 mg Intravenous Q24H  . phenazopyridine  100 mg Oral TID WC   Continuous Infusions: . sodium chloride 1,000 mL (04/29/15 1200)    Time spent on care of this patient: 55 min   [place], MD 04/29/2015, 1:23 PM  LOS: 1 day   [facility] Office  [phone] Pager - Text Page per [email or web address] If 7PM-7AM, please contact night-coverage [email or web address]

## 2015-04-29 NOTE — Progress Notes (Signed)
Urine collected and sent per orders.

## 2015-04-29 NOTE — Progress Notes (Signed)
Pt complaining of being nauseated, Zofran given . Pt refuses to eat breakfast or to get up into chair with PT. Consuelo Pandy RN

## 2015-04-30 LAB — LEGIONELLA ANTIGEN, URINE

## 2015-04-30 LAB — CBC
HCT: 36.1 % (ref 36.0–46.0)
Hemoglobin: 11.7 g/dL — ABNORMAL LOW (ref 12.0–15.0)
MCH: 29.3 pg (ref 26.0–34.0)
MCHC: 32.4 g/dL (ref 30.0–36.0)
MCV: 90.3 fL (ref 78.0–100.0)
Platelets: 259 10*3/uL (ref 150–400)
RBC: 4 MIL/uL (ref 3.87–5.11)
RDW: 13.2 % (ref 11.5–15.5)
WBC: 13.3 10*3/uL — AB (ref 4.0–10.5)

## 2015-04-30 LAB — PATHOLOGIST SMEAR REVIEW

## 2015-04-30 LAB — BASIC METABOLIC PANEL
ANION GAP: 9 (ref 5–15)
BUN: 13 mg/dL (ref 6–20)
CALCIUM: 8.5 mg/dL — AB (ref 8.9–10.3)
CO2: 23 mmol/L (ref 22–32)
Chloride: 104 mmol/L (ref 101–111)
Creatinine, Ser: 0.68 mg/dL (ref 0.44–1.00)
GFR calc Af Amer: >60 mL/min (ref >60)
GLUCOSE: 132 mg/dL — AB (ref 65–99)
Potassium: 4.1 mmol/L (ref 3.5–5.1)
SODIUM: 136 mmol/L (ref 135–145)

## 2015-04-30 LAB — URINE CULTURE: Culture: >=100000

## 2015-04-30 MED ORDER — AMPICILLIN 250 MG PO CAPS: 250.0000 mg | ORAL_CAPSULE | Freq: Four times a day (QID) | ORAL | Status: DC

## 2015-04-30 MED ORDER — AMOXICILLIN 500 MG PO CAPS: 500.0000 mg | ORAL_CAPSULE | Freq: Two times a day (BID) | ORAL | Status: DC

## 2015-04-30 MED ORDER — LOSARTAN POTASSIUM 50 MG PO TABS
50.0000 mg | ORAL_TABLET | Freq: Every day | ORAL | Status: DC
  Administered 2015-04-30: 50 mg via ORAL
  Filled 2015-04-30: qty 1

## 2015-04-30 MED ORDER — ONDANSETRON HCL 4 MG PO TABS: 4.0000 mg | ORAL_TABLET | Freq: Three times a day (TID) | ORAL | Status: DC | PRN

## 2015-04-30 MED ORDER — AMOXICILLIN 500 MG PO CAPS
500.0000 mg | ORAL_CAPSULE | Freq: Two times a day (BID) | ORAL | Status: DC
Start: 2015-04-30 — End: 2015-04-30
  Administered 2015-04-30: 500 mg via ORAL
  Filled 2015-04-30 (×2): qty 1

## 2015-04-30 NOTE — Progress Notes (Signed)
OT Cancellation Note  Patient Details Name: [name] MRN: [id number] DOB: 06-16-1915   Cancelled Treatment:    Reason Eval/Treat Not Completed: Other (comment) (transferring units) Ot to attempt to evaluation after transfer to [facility] at next appropriate time  Vonita Moss   OTR/L Pager: [phone] Office: [phone] .  04/30/2015, 2:57 PM

## 2015-04-30 NOTE — Discharge Summary (Signed)
Physician Discharge Summary  [name] WUJ:[id number] DOB: 08-09-1915 DOA: 04/28/2015  PCP: Hollace Kinnier, DO  Admit date: 04/28/2015 Discharge date: 04/30/2015  Time spent: 50 minutes  Recommendations for Outpatient Follow-up:  1. Bmet and CBC in 4-5 days  2. Avoid phenergan  3. Recommend palliative care consult as SNF for lung cancer  4. Stop Amoxil in 1wk  Discharge Condition: stable    Discharge Diagnoses:  Principal Problem:   Septic shock ([place]) Active Problems:   UTI (lower urinary tract infection)   AKI (acute kidney injury) ([place])   Acute encephalopathy   Essential hypertension, benign   GERD   Hearing loss   Pleural effusion, left   Hypotension   Cancer of left lung (HCC)   Lung cancer (HCC)   Hyperkalemia   Chronic diastolic congestive heart failure (H[facility])   History of present illness:  [name] is a 82 y.o. female with PMH of lung cancer of adenocarcinoma (no surgery, chemotherapy or radiation therapy due to advanced age), left malignant pleural effusion, s/p of left chest tube placement on 04/13/15, hypertension, GERD, depression, anxiety, mitral valve prolapse, deafness, diastolic congestive heart failure, who presents with fever, altered mental status and generalized weakness.  Hospital Course:  Principal Problem:  Septic shock /  UTI - likely due to UTI but also noted to have peri-hilar infiltrates on CXR- however, did not develop a cough or become hypoxic and therefore does not appear to have pneumonia - fever resolved - influenza negative  - UA has grown enterococcus sensitive to Amoxil which need to be given for 1 wk  Active Problems: Acute encephalopathy - son feels that phenergan may have contributed to her confusion- will d/c and replace with Zofran   Essential hypertension, benign - Toprol, Losartan and Lasix held due to hypotension on admission - can resume on d/c today  Tachycardia - possibly due to B Blocker withdraw-  resolved after resumingToprol  Left lung cancer and malignant pl effusion - has pleurex  AKI - prerenal?- resolved with hydration-   - incontinent and therefore unable to get accurate I and O   Hyperkalemia - resolved   Chronic diastolic congestive heart failure - gr 1 diastolic CHF - lasix was on hold due to ARF and hypotension on admission  Procedures:  noen  Consultations:  none  Discharge Exam: Filed Weights   04/28/15 0059  Weight: 79.379 kg (175 lb)   Filed Vitals:   04/30/15 1046 04/30/15 1134  BP: 153/78   Pulse: 94   Temp:  98 F (36.7 C)  Resp: 19     General: AAO x 3, no distress Cardiovascular: RRR, no murmurs  Respiratory: clear to auscultation bilaterally GI: soft, non-tender, non-distended, bowel sound positive  Discharge Instructions You were cared for by a hospitalist during your hospital stay. If you have any questions about your discharge medications or the care you received while you were in the hospital after you are discharged, you can call the unit and asked to speak with the hospitalist on call if the hospitalist that took care of you is not available. Once you are discharged, your primary care physician will handle any further medical issues. Please note that NO REFILLS for any discharge medications will be authorized once you are discharged, as it is imperative that you return to your primary care physician (or establish a relationship with a primary care physician if you do not have one) for your aftercare needs so that they can reassess your  need for medications and monitor your lab values.      Discharge Instructions    Diet - low sodium heart healthy    Complete by:  As directed      Increase activity slowly    Complete by:  As directed             Medication List    STOP taking these medications        fluconazole 100 MG tablet  Commonly known as:  DIFLUCAN     promethazine 25 MG/ML injection  Commonly known as:   PHENERGAN     UNABLE TO FIND      TAKE these medications        acetaminophen 325 MG tablet  Commonly known as:  TYLENOL  Take 1-2 tablets (325-650 mg total) by mouth every 4 (four) hours as needed for mild pain.     amoxicillin 500 MG capsule  Commonly known as:  AMOXIL  Take 1 capsule (500 mg total) by mouth every 12 (twelve) hours.     bisacodyl 10 MG suppository  Commonly known as:  DULCOLAX  Place 10 mg rectally as needed for moderate constipation.     docusate sodium 100 MG capsule  Commonly known as:  COLACE  Take 1 capsule (100 mg total) by mouth 2 (two) times daily as needed for mild constipation.     feeding supplement Liqd  Take 1 Container by mouth 3 (three) times daily between meals.     furosemide 20 MG tablet  Commonly known as:  LASIX  Take 20 mg by mouth daily.     KLOR-CON M20 20 MEQ tablet  Generic drug:  potassium chloride SA  Take 40 mEq by mouth daily.     lactulose 10 GM/15ML solution  Commonly known as:  CHRONULAC  Take 20 g by mouth daily as needed for mild constipation.     losartan 50 MG tablet  Commonly known as:  COZAAR  TAKE 1 TABLET (50 MG TOTAL) BY MOUTH DAILY.     magnesium hydroxide 400 MG/5ML suspension  Commonly known as:  MILK OF MAGNESIA  Take 30 mLs by mouth daily as needed for mild constipation.     metoprolol succinate 100 MG 24 hr tablet  Commonly known as:  TOPROL-XL  TAKE ONE TABLET BY MOUTH ONCE DAILY.     metroNIDAZOLE 0.75 % cream  Commonly known as:  METROCREAM  Apply 1 application topically daily.     multivitamin with minerals Tabs tablet  Take 1 tablet by mouth daily.     ondansetron 4 MG tablet  Commonly known as:  ZOFRAN  Take 1 tablet (4 mg total) by mouth every 8 (eight) hours as needed for nausea or vomiting.     oxyCODONE-acetaminophen 5-325 MG tablet  Commonly known as:  PERCOCET/ROXICET  Take 1 tablet by mouth every 6 (six) hours as needed for moderate pain.     polyethylene glycol packet   Commonly known as:  MIRALAX / GLYCOLAX  Take 17 g by mouth 2 (two) times daily.     PROBIOTIC DAILY PO  Take 1 tablet by mouth daily.     RA SALINE ENEMA RE  Place 1 each rectally as needed (for constipation).     sertraline 100 MG tablet  Commonly known as:  ZOLOFT  Take 1 tablet (100 mg total) by mouth at bedtime.       Allergies  Allergen Reactions  . Aleve [Naproxen Sodium] Swelling  Legs and feet  . Statins Other (See Comments)    myopathy      The results of significant diagnostics from this hospitalization (including imaging, microbiology, ancillary and laboratory) are listed below for reference.    Significant Diagnostic Studies: Dg Chest 2 View  04/11/2015  CLINICAL DATA:  Pleural effusion.  Wheezing.  Shortness of breath. EXAM: CHEST  2 VIEW COMPARISON:  03/23/2015. FINDINGS: Consolidation of the lower half of the left hemi thorax is noted consistent with left lower lobe infiltrate with prominent left pleural effusion. Right lung is clear. Cardiomegaly with normal pulmonary vascularity. No acute bony abnormality. IMPRESSION: Of the Opacification lower half of the left hemi thorax consistent with left lower lobe consolidation and left pleural effusion . Electronically Signed   By: Marcello Moores  Register   On: 04/11/2015 12:22   Dg Chest Port 1 View  04/29/2015  CLINICAL DATA:  Cough EXAM: PORTABLE CHEST 1 VIEW COMPARISON:  04/28/2015 FINDINGS: Cardiac shadow is stable. The lungs are well aerated bilaterally. Mild perihilar changes are again seen and stable. No new focal infiltrate is seen no effusions are noted. IMPRESSION: Stable perihilar infiltrates. Electronically Signed   By: Inez Catalina M.D.   On: 04/29/2015 07:44   Dg Chest Port 1 View  04/28/2015  CLINICAL DATA:  Code sepsis. EXAM: PORTABLE CHEST 1 VIEW COMPARISON:  04/13/2015 FINDINGS: Shallow inspiration. Mild cardiac enlargement. Mild pulmonary vascular congestion. Mild perihilar infiltration may represent edema  or pneumonia. Probable tiny left pleural effusion. No pneumothorax. Calcified and ectatic aorta. Surgical clips in the right upper quadrant. IMPRESSION: Cardiac enlargement with mild pulmonary vascular congestion. Perihilar infiltrates may represent edema or pneumonia. Tiny left pleural effusion. Electronically Signed   By: Lucienne Capers M.D.   On: 04/28/2015 01:55   Dg Chest Port 1 View  04/13/2015  CLINICAL DATA:  PleurX drainage catheter placement EXAM: DG C-ARM 1-60 MIN-NO REPORT; PORTABLE CHEST - 1 VIEW COMPARISON:  Chest x-ray 04/11/2015 FINDINGS: Interval resolution of the large left-sided pleural effusion with a left-sided PleurX drainage catheter in place. Chronic lung changes and areas of atelectasis. No pneumothorax. IMPRESSION: Left-sided PleurX drainage catheter in place with resolution of the large left pleural effusion. Electronically Signed   By: Marijo Sanes M.D.   On: 04/13/2015 12:07   Dg C-arm 1-60 Min-no Report  04/13/2015  CLINICAL DATA: surgery C-ARM 1-60 MINUTES Fluoroscopy was utilized by the requesting physician.  No radiographic interpretation.    Microbiology: Recent Results (from the past 240 hour(s))  Urine culture     Status: None   Collection Time: 04/28/15  1:16 AM  Result Value Ref Range Status   Specimen Description URINE, CATHETERIZED  Final   Special Requests NONE  Final   Culture >=100,000 COLONIES/mL ENTEROCOCCUS SPECIES  Final   Report Status 04/30/2015 FINAL  Final   Organism ID, Bacteria ENTEROCOCCUS SPECIES  Final      Susceptibility   Enterococcus species - MIC*    AMPICILLIN <=2 SENSITIVE Sensitive     LEVOFLOXACIN >=8 RESISTANT Resistant     NITROFURANTOIN <=16 SENSITIVE Sensitive     VANCOMYCIN 1 SENSITIVE Sensitive     * >=100,000 COLONIES/mL ENTEROCOCCUS SPECIES  Culture, blood (routine x 2)     Status: None (Preliminary result)   Collection Time: 04/28/15  1:29 AM  Result Value Ref Range Status   Specimen Description BLOOD RIGHT WRIST   Final   Special Requests BOTTLES DRAWN AEROBIC AND ANAEROBIC 5CC   Final   Culture NO  GROWTH 2 DAYS  Final   Report Status PENDING  Incomplete  Gram stain     Status: None   Collection Time: 04/28/15  1:30 AM  Result Value Ref Range Status   Specimen Description PLEURAL LEFT FLUID  Final   Special Requests NONE  Final   Gram Stain   Final    FEW WBC PRESENT, PREDOMINANTLY MONONUCLEAR NO ORGANISMS SEEN    Report Status 04/28/2015 FINAL  Final  Culture, body fluid-bottle     Status: None (Preliminary result)   Collection Time: 04/28/15  1:30 AM  Result Value Ref Range Status   Specimen Description FLUID  Final   Special Requests PLEURAL AEROBIC BOTTLE ONLY  Final   Culture NO GROWTH 2 DAYS  Final   Report Status PENDING  Incomplete  Culture, blood (routine x 2)     Status: None (Preliminary result)   Collection Time: 04/28/15  1:37 AM  Result Value Ref Range Status   Specimen Description BLOOD LEFT WRIST  Final   Special Requests BOTTLES DRAWN AEROBIC AND ANAEROBIC 10CC   Final   Culture NO GROWTH 2 DAYS  Final   Report Status PENDING  Incomplete  MRSA PCR Screening     Status: None   Collection Time: 04/28/15  5:00 AM  Result Value Ref Range Status   MRSA by PCR NEGATIVE NEGATIVE Final    Comment:        The GeneXpert MRSA Assay (FDA approved for NASAL specimens only), is one component of a comprehensive MRSA colonization surveillance program. It is not intended to diagnose MRSA infection nor to guide or monitor treatment for MRSA infections.      Labs: Basic Metabolic Panel:  Recent Labs Lab 04/28/15 0139 04/29/15 0350 04/30/15 0347  NA 138 134* 136  K 5.3* 4.1 4.1  CL 103 102 104  CO2 '27 24 23  '$ GLUCOSE 130* 125* 132*  BUN '17 14 13  '$ CREATININE 1.49* 0.75 0.68  CALCIUM 8.3* 8.2* 8.5*   Liver Function Tests:  Recent Labs Lab 04/28/15 0139  AST 13*  ALT 10*  ALKPHOS 59  BILITOT 0.3  PROT 5.1*  ALBUMIN 2.5*   No results for input(s): LIPASE,  AMYLASE in the last 168 hours. No results for input(s): AMMONIA in the last 168 hours. CBC:  Recent Labs Lab 04/28/15 0139 04/29/15 0350 04/30/15 0347  WBC 14.7* 13.7* 13.3*  NEUTROABS 12.0*  --   --   HGB 11.3* 12.0 11.7*  HCT 34.5* 37.8 36.1  MCV 91.8 91.7 90.3  PLT 224 245 259   Cardiac Enzymes: No results for input(s): CKTOTAL, CKMB, CKMBINDEX, TROPONINI in the last 168 hours. BNP: BNP (last 3 results)  Recent Labs  04/28/15 0450  BNP 54.5    ProBNP (last 3 results) No results for input(s): PROBNP in the last 8760 hours.  CBG: No results for input(s): GLUCAP in the last 168 hours.     SignedDebbe Odea, MD [facility] 04/30/2015, 3:34 PM

## 2015-04-30 NOTE — Progress Notes (Signed)
Patient to transfer to 5w04 report given to receiving nurse, all questions answered at this time.  Pt. VSS with no s/s of distress noted.  Patient stable at transfer.

## 2015-04-30 NOTE — Progress Notes (Signed)
[facility] Progress Note   [name]  NKN:[id number]  DOB: [birth year]  DOA: 04/28/2015 PCP: Hollace Kinnier, DO  Brief narrative: [name] is a 82 y.o. female with PMH of lung cancer of adenocarcinoma (no surgery, chemotherapy or radiation therapy due to advanced age), left malignant pleural effusion, s/p of left chest tube placement on 04/13/15, hypertension, GERD, depression, anxiety, mitral valve prolapse, deafness, diastolic congestive heart failure, who presents with fever, altered mental status and generalized weakness.   Subjective: very nauseated, no vomiting. No pain or cough.  Assessment/Plan: Principal Problem:   Septic shock / Acute encephalopathy/ UTI - likely due to UTI but also noted to have peri-hilar infiltrates on CXR - d/c Vanc as MRSA PCR neg -  Amoxil for enterococus ordered today - fever resolved - influenza negative   Active Problems: Nausea-  - has not had any PO intake today - cont antiemetics and follow - no c/o abdominal pain or diarrhea- abdominal exam benign    Essential hypertension, benign - Toprol, Losartan and Lasix held due to hypotension on admission - resumed Toprol yesterday- will resume Losartan today  Tachycardia - possibly due to B Blocker withdraw- resumed Toprol   Left lung cancer and malignant pl effusion - has pleurex  AKI - prerenal?- resolved with hydration-   -  incontinent and therefore unable to get accurate I and O    Hyperkalemia - resolved    Chronic diastolic congestive heart failure - gr 1 diastolic CHF - lasix on hold due to ARF and hypotension on admission - follow for hypoxia    Antibiotics: Anti-infectives    Start     Dose/Rate Route Frequency Ordered Stop   05/01/15 0300  vancomycin (VANCOCIN) IVPB 1000 mg/200 mL premix  Status:  Discontinued     1,000 mg 200 mL/hr over 60 Minutes Intravenous Every 48 hours 04/28/15 0416 04/28/15 1312   04/30/15 1200  ampicillin (PRINCIPEN)  capsule 250 mg  Status:  Discontinued     250 mg Oral 4 times per day 04/30/15 1120 04/30/15 1146   04/30/15 1200  amoxicillin (AMOXIL) capsule 500 mg     500 mg Oral Every 12 hours 04/30/15 1147     04/29/15 0300  ceFEPIme (MAXIPIME) 2 g in dextrose 5 % 50 mL IVPB  Status:  Discontinued     2 g 100 mL/hr over 30 Minutes Intravenous Every 24 hours 04/28/15 0416 04/30/15 1144   04/28/15 0600  ceFEPIme (MAXIPIME) 1 g in dextrose 5 % 50 mL IVPB  Status:  Discontinued     1 g 100 mL/hr over 30 Minutes Intravenous 3 times per day 04/28/15 0408 04/28/15 0414   04/28/15 0215  vancomycin (VANCOCIN) IVPB 1000 mg/200 mL premix     1,000 mg 200 mL/hr over 60 Minutes Intravenous  Once 04/28/15 0203 04/28/15 0322   04/28/15 0215  ceFEPIme (MAXIPIME) 2 g in dextrose 5 % 50 mL IVPB     2 g 100 mL/hr over 30 Minutes Intravenous  Once 04/28/15 0205 04/28/15 3790     Code Status:     Code Status Orders        Start     Ordered   04/28/15 0407  Do not attempt resuscitation (DNR)   Continuous    Question Answer Comment  In the event of cardiac or respiratory ARREST Do not call a "code blue"   In the event of cardiac or respiratory ARREST Do not perform Intubation, CPR, defibrillation or ACLS  In the event of cardiac or respiratory ARREST Use medication by any route, position, wound care, and other measures to relive pain and suffering. May use oxygen, suction and manual treatment of airway obstruction as needed for comfort.      04/28/15 0408    Code Status History    Date Active Date Inactive Code Status Order ID Comments User Context   03/19/2015  4:41 PM 03/27/2015  6:11 PM DNR [id number]  Nat Christen, PA-C Inpatient   03/19/2015  4:35 PM 03/19/2015  4:41 PM Full Code [id number]  Donnie Mesa, MD Inpatient   03/16/2015  4:26 PM 03/19/2015  4:35 PM DNR [id number]  Melton Alar, PA-C Inpatient   03/16/2015 10:09 AM 03/16/2015  4:26 PM Full Code [id number]  Earnstine Regal, PA-C Inpatient    08/25/2012  3:57 PM 08/30/2012  9:32 PM DNR [id number]  Orson Eva, MD Inpatient    Advance Directive Documentation        Most Recent Value   Type of Advance Directive  Living will, Out of facility DNR (pink MOST or yellow form)   Pre-existing out of facility DNR order (yellow form or pink MOST form)     "MOST" Form in Place?       Family Communication:  Disposition Plan: return to SNF in 1-2 days DVT prophylaxis: Lovenox Consultants:  Procedures:     Objective: Filed Weights   04/28/15 0059  Weight: 79.379 kg (175 lb)    Intake/Output Summary (Last 24 hours) at 04/30/15 1228 Last data filed at 04/29/15 1630  Gross per 24 hour  Intake      0 ml  Output    175 ml  Net   -175 ml     Vitals Filed Vitals:   04/30/15 0600 04/30/15 0800 04/30/15 1046 04/30/15 1134  BP: 169/92 167/92 153/78   Pulse: 103 98 94   Temp:  99.1 F (37.3 C)  98 F (36.7 C)  TempSrc:  Oral  Oral  Resp: 32 40 19   Height:      Weight:      SpO2: 92% 91% 92%     Exam:  General:  Pt is alert, not in acute distress  HEENT: No icterus, No thrush, oral mucosa moist  Cardiovascular: regular rate and rhythm, S1/S2 No murmur  Respiratory: clear to auscultation bilaterally   Abdomen: Soft, +Bowel sounds, non tender, non distended, no guarding  MSK: No cyanosis or clubbing- no pedal edema   Data Reviewed: Basic Metabolic Panel:  Recent Labs Lab 04/28/15 0139 04/29/15 0350 04/30/15 0347  NA 138 134* 136  K 5.3* 4.1 4.1  CL 103 102 104  CO2 '27 24 23  '$ GLUCOSE 130* 125* 132*  BUN '17 14 13  '$ CREATININE 1.49* 0.75 0.68  CALCIUM 8.3* 8.2* 8.5*   Liver Function Tests:  Recent Labs Lab 04/28/15 0139  AST 13*  ALT 10*  ALKPHOS 59  BILITOT 0.3  PROT 5.1*  ALBUMIN 2.5*   No results for input(s): LIPASE, AMYLASE in the last 168 hours. No results for input(s): AMMONIA in the last 168 hours. CBC:  Recent Labs Lab 04/28/15 0139 04/29/15 0350 04/30/15 0347  WBC 14.7* 13.7* 13.3*   NEUTROABS 12.0*  --   --   HGB 11.3* 12.0 11.7*  HCT 34.5* 37.8 36.1  MCV 91.8 91.7 90.3  PLT 224 245 259   Cardiac Enzymes: No results for input(s): CKTOTAL, CKMB, CKMBINDEX, TROPONINI in the last 168 hours. BNP (last 3 results)  Recent Labs  04/28/15 0450  BNP 54.5    ProBNP (last 3 results) No results for input(s): PROBNP in the last 8760 hours.  CBG: No results for input(s): GLUCAP in the last 168 hours.  Recent Results (from the past 240 hour(s))  Urine culture     Status: None   Collection Time: 04/28/15  1:16 AM  Result Value Ref Range Status   Specimen Description URINE, CATHETERIZED  Final   Special Requests NONE  Final   Culture >=100,000 COLONIES/mL ENTEROCOCCUS SPECIES  Final   Report Status 04/30/2015 FINAL  Final   Organism ID, Bacteria ENTEROCOCCUS SPECIES  Final      Susceptibility   Enterococcus species - MIC*    AMPICILLIN <=2 SENSITIVE Sensitive     LEVOFLOXACIN >=8 RESISTANT Resistant     NITROFURANTOIN <=16 SENSITIVE Sensitive     VANCOMYCIN 1 SENSITIVE Sensitive     * >=100,000 COLONIES/mL ENTEROCOCCUS SPECIES  Culture, blood (routine x 2)     Status: None (Preliminary result)   Collection Time: 04/28/15  1:29 AM  Result Value Ref Range Status   Specimen Description BLOOD RIGHT WRIST  Final   Special Requests BOTTLES DRAWN AEROBIC AND ANAEROBIC 5CC   Final   Culture NO GROWTH 1 DAY  Final   Report Status PENDING  Incomplete  Gram stain     Status: None   Collection Time: 04/28/15  1:30 AM  Result Value Ref Range Status   Specimen Description PLEURAL LEFT FLUID  Final   Special Requests NONE  Final   Gram Stain   Final    FEW WBC PRESENT, PREDOMINANTLY MONONUCLEAR NO ORGANISMS SEEN    Report Status 04/28/2015 FINAL  Final  Culture, body fluid-bottle     Status: None (Preliminary result)   Collection Time: 04/28/15  1:30 AM  Result Value Ref Range Status   Specimen Description FLUID  Final   Special Requests PLEURAL AEROBIC BOTTLE ONLY   Final   Culture NO GROWTH 1 DAY  Final   Report Status PENDING  Incomplete  Culture, blood (routine x 2)     Status: None (Preliminary result)   Collection Time: 04/28/15  1:37 AM  Result Value Ref Range Status   Specimen Description BLOOD LEFT WRIST  Final   Special Requests BOTTLES DRAWN AEROBIC AND ANAEROBIC 10CC   Final   Culture NO GROWTH 1 DAY  Final   Report Status PENDING  Incomplete  MRSA PCR Screening     Status: None   Collection Time: 04/28/15  5:00 AM  Result Value Ref Range Status   MRSA by PCR NEGATIVE NEGATIVE Final    Comment:        The GeneXpert MRSA Assay (FDA approved for NASAL specimens only), is one component of a comprehensive MRSA colonization surveillance program. It is not intended to diagnose MRSA infection nor to guide or monitor treatment for MRSA infections.      Studies: Dg Chest Port 1 View  04/29/2015  CLINICAL DATA:  Cough EXAM: PORTABLE CHEST 1 VIEW COMPARISON:  04/28/2015 FINDINGS: Cardiac shadow is stable. The lungs are well aerated bilaterally. Mild perihilar changes are again seen and stable. No new focal infiltrate is seen no effusions are noted. IMPRESSION: Stable perihilar infiltrates. Electronically Signed   By: Inez Catalina M.D.   On: 04/29/2015 07:44    Scheduled Meds:  Scheduled Meds: . amoxicillin  500 mg Oral Q12H  . enoxaparin (LOVENOX) injection  30 mg Subcutaneous Q24H  . feeding  supplement  1 Container Oral TID BM  . metoprolol succinate  100 mg Oral Daily  . metroNIDAZOLE   Topical QHS  . saccharomyces boulardii  250 mg Oral BID  . sertraline  100 mg Oral QHS   Continuous Infusions:    Time spent on care of this patient: 71 min   [place], MD 04/30/2015, 12:28 PM  LOS: 2 days   [facility] Office  [phone] Pager - Text Page per [email or web address] If 7PM-7AM, please contact night-coverage [email or web address]

## 2015-04-30 NOTE — Clinical Social Work Note (Signed)
Clinical Social Work Assessment  Patient Details  Name: [name] MRN: [id number] Date of Birth: Sep 12, 1915  Date of referral:  04/30/15               Reason for consult:  Facility Placement                Permission sought to share information with:  Family Supports Permission granted to share information::  Yes, Verbal Permission Granted  Name::     [name]  Agency::     Relationship::  Daughter  Contact Information:  [phone] (cell)  Housing/Transportation Living arrangements for the past 2 months:  [place] (From [place]) Source of Information:  Patient Patient Interpreter Needed:  None Criminal Activity/Legal Involvement Pertinent to Current Situation/Hospitalization:  No - Comment as needed Significant Relationships:   Adult children Lives with:  Adult Children Do you feel safe going back to the place where you live?  Yes Need for family participation in patient care:  Yes (Comment)  Care giving concerns:  Patient expressed no concerns, as she is from a facility   Facilities manager / plan:  CSW talked with patient regarding her discharge plan as she is from [place]. Patient was lying in bad and was alert, oriented and willing to talk with CSW. Patient is HOH. When asked, [name] responded that she will be going back to [facility] to continue her rehab.  She informed CSW that she has one daughter, [name].  Employment status:  Retired Science writer) PT Recommendations:  [place] / Referral to community resources:  [place] (Patient from a facility)  Patient/Family's Response to care:  No concerns expressed.  Patient/Family's Understanding of and Emotional Response to Diagnosis, Current Treatment, and Prognosis:  Not discussed.  Emotional Assessment Appearance:  Appears younger than stated age Attitude/Demeanor/Rapport:  Other  (Appropriate) Affect (typically observed):  Pleasant, Appropriate Orientation:  Oriented to Self, Oriented to Place, Oriented to  Time, Oriented to Situation Alcohol / Substance use:  Never Used Psych involvement (Current and /or in the community):  No (Comment)  Discharge Needs  Concerns to be addressed:  Discharge Planning Concerns Readmission within the last 30 days:  Yes Current discharge risk:  None Barriers to Discharge:  No Barriers Identified   Sable Feil, LCSW 04/30/2015, 3:51 PM

## 2015-04-30 NOTE — [place] FL2 (Signed)
[place] MEDICAID FL2 LEVEL OF CARE SCREENING TOOL     IDENTIFICATION  Patient Name: [name] Birthdate: [birth year] Sex: female Admission Date (Current Location): 04/28/2015  [facility] and [place] Number:  Herbalist and Address:  [facility], [place] [address], [place], [place] [address]      Provider Number: [id number]  Attending Physician Name and Address:  Debbe Odea, MD  Relative Name and Phone Number:  [name], daughter.  Phone - [phone] (cell)    Current Level of Care: Hospital Recommended Level of Care: [place] Prior Approval Number:    Date Approved/Denied:   PASRR Number: [id number] (Eff. 08/28/12)  Discharge Plan: SNF    Current Diagnoses: Patient Active Problem List   Diagnosis Date Noted  . UTI (lower urinary tract infection) 04/28/2015  . Septic shock ([place]) 04/28/2015  . Cancer of left lung ([place]) 04/28/2015  . AKI (acute kidney injury) ([place]) 04/28/2015  . Lung cancer ([place]) 04/28/2015  . Acute encephalopathy 04/28/2015  . Hyperkalemia 04/28/2015  . Chronic diastolic congestive heart failure ([place]) 04/28/2015  . Hypotension 04/09/2015  . S/P laparoscopic cholecystectomy 04/09/2015  . Gallbladder disease 03/19/2015  . Symptomatic cholelithiasis 03/16/2015  . Pleural effusion, left 03/16/2015  . Atrophic vaginitis 12/29/2014  . Pain in lower limb 06/01/2013  . Ingrown nail 06/01/2013  . Hemorrhoids 03/16/2013  . Hereditary and idiopathic peripheral neuropathy 03/16/2013  . Solitary pulmonary nodule 12/14/2012  . Edema 10/28/2012  . Urinary hesitancy 10/28/2012  . Anemia, iron deficiency 09/06/2012  . Onychomycosis 08/04/2012  . Neck pain 06/24/2012  . Vulvar burning 05/29/2012  . Tricompartment degenerative joint disease of knee 12/10/2011  . Tinnitus 10/20/2011  . Hearing loss 08/11/2011  . Dizziness 03/05/2011  . KNEE PAIN 02/14/2010  . Hyperlipidemia 01/13/2007  .  Essential hypertension, benign 01/13/2007  . HAY FEVER 01/13/2007  . GERD 01/13/2007  . ARTHRITIS 01/13/2007  . PLANTAR FASCIITIS 01/13/2007  . Personal history of unspecified circulatory disease 01/13/2007  . HIATAL HERNIA, HX OF 01/13/2007    Orientation RESPIRATION BLADDER Height & Weight     Self, Time, Situation, Place  Normal Incontinent Weight: 175 lb (79.379 kg) Height:  '5\' 6"'$  (167.6 cm)  BEHAVIORAL SYMPTOMS/MOOD NEUROLOGICAL BOWEL NUTRITION STATUS      Continent Diet (Low sodium - Heart healthy)  AMBULATORY STATUS COMMUNICATION OF NEEDS Skin   Extensive Assist (Patient did not ambulate during PT eval on 1/29) Verbally Normal, Other (Comment) (Incision left chest)                       Personal Care Assistance Level of Assistance  Bathing, Feeding, Dressing Bathing Assistance: Limited assistance Feeding assistance: Independent Dressing Assistance: Limited assistance     Functional Limitations Info  Sight, Hearing, Speech Sight Info: Adequate Hearing Info: Impaired Speech Info: Adequate    SPECIAL CARE FACTORS FREQUENCY  PT (By licensed PT)     PT Frequency: Evaluated 04/29/15. A minimum of 2x per week therapy recommended              Contractures Contractures Info: Not present    Additional Factors Info  Code Status, Allergies Code Status Info: DNR Allergies Info: Aleve, Statins           Current Medications (04/30/2015):  This is the current hospital active medication list Current Facility-Administered Medications  Medication Dose Route Frequency Provider Last Rate Last Dose  . albuterol (PROVENTIL) (2.5 MG/3ML) 0.083% nebulizer solution  2.5 mg  2.5 mg Nebulization Q4H PRN Ivor Costa, MD      . amoxicillin (AMOXIL) capsule 500 mg  500 mg Oral Q12H Debbe Odea, MD   500 mg at 04/30/15 1254  . bisacodyl (DULCOLAX) suppository 10 mg  10 mg Rectal PRN Debbe Odea, MD      . docusate sodium (COLACE) capsule 100 mg  100 mg Oral BID PRN Debbe Odea,  MD      . enoxaparin (LOVENOX) injection 30 mg  30 mg Subcutaneous Q24H Ivor Costa, MD   30 mg at 04/30/15 1524  . feeding supplement (BOOST / RESOURCE BREEZE) liquid 1 Container  1 Container Oral TID BM Debbe Odea, MD   1 Container at 04/30/15 1000  . hydrALAZINE (APRESOLINE) injection 5 mg  5 mg Intravenous Q6H PRN Dianne Dun, NP   5 mg at 04/30/15 0438  . HYDROmorphone (DILAUDID) injection 0.5-1 mg  0.5-1 mg Intravenous Q4H PRN Dianne Dun, NP   1 mg at 04/30/15 5945  . losartan (COZAAR) tablet 50 mg  50 mg Oral Daily Debbe Odea, MD   50 mg at 04/30/15 1520  . metoprolol succinate (TOPROL-XL) 24 hr tablet 100 mg  100 mg Oral Daily Debbe Odea, MD   100 mg at 04/30/15 1027  . metroNIDAZOLE (METROGEL) 0.75 % gel   Topical QHS Debbe Odea, MD      . ondansetron (ZOFRAN) injection 4 mg  4 mg Intravenous Q6H PRN Dianne Dun, NP   4 mg at 04/30/15 1219  . saccharomyces boulardii (FLORASTOR) capsule 250 mg  250 mg Oral BID Debbe Odea, MD   250 mg at 04/30/15 1027  . sertraline (ZOLOFT) tablet 100 mg  100 mg Oral QHS Debbe Odea, MD   100 mg at 04/29/15 2120     Discharge Medications: Please see discharge summary for a list of discharge medications.  Relevant Imaging Results:  Relevant Lab Results:   Additional Information ss#[id number]  Sable Feil, LCSW

## 2015-04-30 NOTE — Progress Notes (Signed)
Patient to discharge back to [facility] report given to receiving nurse katlyn all questions answered at this time.  Pt. VSS with no s/s of distress noted.  Patient stable at discharge.

## 2015-04-30 NOTE — Progress Notes (Signed)
Patient discharged to [facility] report given to receiving nurse.  Belongings given to patients daughter.  Pt. Stable at transfer.

## 2015-04-30 NOTE — Progress Notes (Signed)
Patient to transfer to [phone] receiving nurse Lonn [place] unable to take report at this time.  Will continue to follow up.

## 2015-04-30 NOTE — Care Management Note (Addendum)
Case Management Note  Patient Details  Name: [name] MRN: [id number] Date of Birth: 12-25-1915  Subjective/Objective:        Patient is from [facility]) and plan is to return to [facility] possibly today, per MD.            Action/Plan:   Expected Discharge Date:                  Expected Discharge Plan:  Skilled Nursing Facility  In-House Referral:  Clinical Social Work  Discharge planning Services  CM Consult  Post Acute Care Choice:    Choice offered to:     DME Arranged:    DME Agency:     HH Arranged:    [place] Agency:     Status of Service:  Completed, signed off  Medicare Important Message Given:    Date Medicare IM Given:    Medicare IM give by:    Date Additional Medicare IM Given:    Additional Medicare Important Message give by:     If discussed at [place] of Stay Meetings, dates discussed:    Additional Comments:  [name], RN 04/30/2015, 10:42 AM

## 2015-04-30 NOTE — Clinical Social Work Note (Signed)
Patient medically stable for discharge back to [facility] today. Discharge information transmitted to facility and patient will be transported by ambulance. Daughter [name] at the bedside.   [name], MSW, LCSW Licensed Clinical Social Worker [place] [phone]

## 2015-05-01 ENCOUNTER — Other Ambulatory Visit: Payer: Self-pay | Admitting: Surgery

## 2015-05-01 DIAGNOSIS — C3490 Malignant neoplasm of unspecified part of unspecified bronchus or lung: Secondary | ICD-10-CM

## 2015-05-01 LAB — HEPATIC FUNCTION PANEL
ALT: 9 U/L (ref 7–35)
AST: 14 U/L (ref 13–35)
Alkaline Phosphatase: 64 U/L (ref 25–125)
BILIRUBIN, TOTAL: 0.4 mg/dL

## 2015-05-01 LAB — CBC AND DIFFERENTIAL
HEMATOCRIT: 36 % (ref 36–46)
HEMOGLOBIN: 11.9 g/dL — AB (ref 12.0–16.0)
Platelets: 332 10*3/uL (ref 150–399)
WBC: 11.3 10*3/mL

## 2015-05-01 LAB — BASIC METABOLIC PANEL
BUN: 20 mg/dL (ref 4–21)
Creatinine: 0.5 mg/dL (ref 0.5–1.1)
GLUCOSE: 132 mg/dL
Potassium: 3.2 mmol/L — AB (ref 3.4–5.3)
Sodium: 135 mmol/L — AB (ref 137–147)

## 2015-05-02 ENCOUNTER — Ambulatory Visit (INDEPENDENT_AMBULATORY_CARE_PROVIDER_SITE_OTHER): Payer: Medicare Other | Admitting: Surgery

## 2015-05-02 ENCOUNTER — Other Ambulatory Visit: Payer: Self-pay | Admitting: *Deleted

## 2015-05-02 ENCOUNTER — Encounter: Payer: Self-pay | Admitting: Surgery

## 2015-05-02 ENCOUNTER — Ambulatory Visit
Admission: RE | Admit: 2015-05-02 | Discharge: 2015-05-02 | Disposition: A | Discharge status: Home / Self Care | Payer: Medicare Other | Source: Ambulatory Visit | Attending: Surgery | Admitting: Surgery

## 2015-05-02 VITALS — BP 157/85 | HR 86 | Resp 16 | Ht 68.0 in | Wt 175.0 lb

## 2015-05-02 DIAGNOSIS — J948 Other specified pleural conditions: Secondary | ICD-10-CM

## 2015-05-02 DIAGNOSIS — Z09 Encounter for follow-up examination after completed treatment for conditions other than malignant neoplasm: Secondary | ICD-10-CM

## 2015-05-02 DIAGNOSIS — J90 Pleural effusion, not elsewhere classified: Secondary | ICD-10-CM

## 2015-05-02 DIAGNOSIS — C3490 Malignant neoplasm of unspecified part of unspecified bronchus or lung: Secondary | ICD-10-CM

## 2015-05-03 ENCOUNTER — Non-Acute Institutional Stay (SKILLED_NURSING_FACILITY): Payer: Medicare Other | Admitting: Internal Medicine

## 2015-05-03 ENCOUNTER — Encounter: Payer: Self-pay | Admitting: Internal Medicine

## 2015-05-03 DIAGNOSIS — I5032 Chronic diastolic (congestive) heart failure: Secondary | ICD-10-CM

## 2015-05-03 DIAGNOSIS — R600 Localized edema: Secondary | ICD-10-CM | POA: Diagnosis not present

## 2015-05-03 DIAGNOSIS — H9193 Unspecified hearing loss, bilateral: Secondary | ICD-10-CM | POA: Diagnosis not present

## 2015-05-03 DIAGNOSIS — I951 Orthostatic hypotension: Secondary | ICD-10-CM

## 2015-05-03 DIAGNOSIS — K219 Gastro-esophageal reflux disease without esophagitis: Secondary | ICD-10-CM | POA: Diagnosis not present

## 2015-05-03 DIAGNOSIS — R110 Nausea: Secondary | ICD-10-CM

## 2015-05-03 DIAGNOSIS — J948 Other specified pleural conditions: Secondary | ICD-10-CM

## 2015-05-03 DIAGNOSIS — G609 Hereditary and idiopathic neuropathy, unspecified: Secondary | ICD-10-CM | POA: Diagnosis not present

## 2015-05-03 DIAGNOSIS — C3492 Malignant neoplasm of unspecified part of left bronchus or lung: Secondary | ICD-10-CM

## 2015-05-03 DIAGNOSIS — J90 Pleural effusion, not elsewhere classified: Secondary | ICD-10-CM

## 2015-05-03 LAB — CULTURE, BLOOD (ROUTINE X 2)
Culture: NO GROWTH
Culture: NO GROWTH

## 2015-05-03 LAB — CULTURE, BODY FLUID-BOTTLE: CULTURE: NO GROWTH

## 2015-05-03 NOTE — Progress Notes (Signed)
Patient ID: [name], female   DOB: 1915-08-24, 82 y.o.   MRN: [id number]    HISTORY AND PHYSICAL   DATE: 05/03/15  Location:  [facility]    Place of Service: SNF (31)   Extended Emergency Contact Information Primary Emergency Contact: Julieta Gutting [place] Phone: [phone] Mobile Phone: [phone] Relation: Daughter Secondary Emergency Contact: Vecchio,Clyde Address: [place], [place] [address] Johnnette Litter of [place] Phone: [phone] Mobile Phone: [phone] Relation: Son  Advanced Directive information  DNR  Chief Complaint  Patient presents with  . Readmit To SNF    HPI:  82 yo female seen today as a readmission into SNF following hospital stay for septic shock with UTI, AKI, acute encephalopathy, lung CA with malignant left pleural effusion, hyperkalemia, chronic diastolic HF, HTN GERD, hypotension, hearing loss. CXR revealed perihilar infiltrates but pt had no cough, f/c or hypoxia. UA (+) enterococcus sens to amoxil. Influenza neg. Encephalopathy thought to be due to phenergan which was stopped in the hospital and zofran started instead. AKI improved with IVF. initially lasix, toprol and losartan held due to hypotension but was resumed at d/c. Tachycardia thought to be due to lack of BB.  K 5.3-->4.1 at d/c; Cr 1.49 -->.0.68 at d/c; Hgb 11.7 at d/c; BNP 54.5. pleurix maintained during admission for malignant left pleural effusion  GERD/HH/constipation - stable on colace, miralax and probiotic. She eats yogurt from home. Takes no PPI. She c/o nausea and gas/bloating. She was given high dose phenergan in hospital and it caused sedation but it worked well. She has had a lot of diarrhea and family c/a C diff infection. She is currently taking amoxil for UTI  Left malignant pleural effusion due to lung CA - she has a pluerix that will be removed next week. She has declined any  tx of  tumor  Neuropathy - uncontrolled with  lyrica  HTN - stable on losartan and metoprolol succ. She has long hx occasional low BPs. Currently asymptomatic. Takes lasix with KCL for edema. She gets up several times a night with lasix  Arthritis - pain controlled on prn percocet  Anxiety - stable on zoloft  She takes vitamins daily  Past Medical History  Diagnosis Date  . History of hiatal hernia   . Mitral valve prolapse   . Plantar fasciitis   . Hay fever   . GERD (gastroesophageal reflux disease)   . Palpitations   . Neuromuscular disorder ([place])   . Deafness     "very HOH; even w/hearing aides we have to communicate via writing things down for her to read" (08/25/2012)  . H/O hiatal hernia   . Arthritis     "a little all over" (08/25/2012)  . Lung mass     Hx: of  . Anxiety   . Hemorrhoids   . Neuropathy ([place])   . Hypertension   . Hyperglycemia     Past Surgical History  Procedure Laterality Date  . Foot surgery Bilateral ~ 2000    "pinched nerve on feet" (08/25/2012)  . Vesicovaginal fistula closure w/ tah    . Abdominal hysterectomy  1998  . Tonsillectomy and adenoidectomy  1940's  . Cesarean section  1952  . Knee arthroscopy w/ meniscectomy Left 07/15/2010    partial medial and lateral meniscectomies.Archie Endo 07/15/2010 (08/25/2012)  . Hip arthroplasty Right 08/26/2012    Procedure: ARTHROPLASTY BIPOLAR HIP;  Surgeon: Rodman Key  Marian Sorrow, MD;  Location: [facility];  Service: Orthopedics;  Laterality: Right;  . Open reduction internal fixation (orif) distal radial fracture Right 09/11/2012    Procedure: OPEN REDUCTION INTERNAL FIXATION (ORIF) RIGHT DISTAL RADIAL FRACTURE/ULNAR POSSIBLE BONE GRAFTING;  Surgeon: Linna Hoff, MD;  Location: [facility];  Service: Orthopedics;  Laterality: Right;  . Cataract extraction, bilateral Bilateral   . Laparoscopic cholecystectomy  03/19/2015  . Cholecystectomy N/A 03/19/2015    Procedure: LAPAROSCOPIC CHOLECYSTECTOMY WITH INTRAOPERATIVE  CHOLANGIOGRAM;  Surgeon: Donnie Mesa, MD;  Location: [facility];  Service: General;  Laterality: N/A;  . Chest tube insertion Left 04/13/2015    Procedure: INSERTION PLEURAL DRAINAGE CATHETER;  Surgeon: [name], MD;  Location: [facility];  Service: Thoracic;  Laterality: Left;    Patient Care Team: Gayland Curry, DO as PCP - General (Geriatric Medicine) Anastasio Auerbach, MD as Consulting Physician (Gynecology)  Social History   Social History  . Marital Status: Widowed    Spouse Name: N/A  . Number of Children: N/A  . Years of Education: N/A   Occupational History  . Not on file.   Social History Main Topics  . Smoking status: Never Smoker   . Smokeless tobacco: Never Used  . Alcohol Use: No  . Drug Use: No  . Sexual Activity: No   Other Topics Concern  . Not on file   Social History Narrative   ** Merged History Encounter **       Married 3 years - widowed 1996   1 son, 1 daughter 2 grandsons   Lives in her own home, son lives with her. Tries to walk every day. Starting to get out more after ortho injury. I- ADLS.   End-of-life issues. DNR and no heroic or extraordinary measures.   Walks with cane     reports that she has never smoked. She has never used smokeless tobacco. She reports that she does not drink alcohol or use illicit drugs.  Family History  Problem Relation Age of Onset  . Colon cancer Mother   . Colon cancer Sister   . Cancer Sister     Breast  . Colon cancer Brother   . Heart disease Brother     Heart Attack  . Cancer Father     Leukemia  . Pneumonia Sister    Family Status  Relation Status Death Age  . Mother Deceased   . Sister Alive   . Brother Deceased   . Father Deceased   . Daughter Alive   . Son Alive   . Maternal Grandmother Deceased   . Maternal Grandfather Deceased   . Paternal Grandmother Deceased   . Paternal Grandfather Deceased   . Brother Deceased   . Sister Deceased 25    Immunization History  Administered  Date(s) Administered  . Influenza Split 03/04/2011  . Pneumococcal Conjugate-13 04/28/2014  . Pneumococcal Polysaccharide-23 07/08/2006  . Tdap 11/08/2013  . Zoster 03/03/2011    Allergies  Allergen Reactions  . Aleve [Naproxen Sodium] Swelling    Legs and feet  . Statins Other (See Comments)    myopathy    Medications: Patient's Medications  New Prescriptions   No medications on file  Previous Medications   ACETAMINOPHEN (TYLENOL) 325 MG TABLET    Take 1-2 tablets (325-650 mg total) by mouth every 4 (four) hours as needed for mild pain.   AMOXICILLIN (AMOXIL) 500 MG CAPSULE    Take 1 capsule (500 mg total) by mouth every  12 (twelve) hours.   BISACODYL (DULCOLAX) 10 MG SUPPOSITORY    Place 10 mg rectally as needed for moderate constipation.   DOCUSATE SODIUM (COLACE) 100 MG CAPSULE    Take 1 capsule (100 mg total) by mouth 2 (two) times daily as needed for mild constipation.   FEEDING SUPPLEMENT (BOOST / RESOURCE BREEZE) LIQD    Take 1 Container by mouth 3 (three) times daily between meals.   FUROSEMIDE (LASIX) 20 MG TABLET    Take 20 mg by mouth daily.   KLOR-CON M20 20 MEQ TABLET    Take 40 mEq by mouth daily.   LACTULOSE (CHRONULAC) 10 GM/15ML SOLUTION    Take 20 g by mouth daily as needed for mild constipation.   LOSARTAN (COZAAR) 50 MG TABLET    TAKE 1 TABLET (50 MG TOTAL) BY MOUTH DAILY.   MAGNESIUM HYDROXIDE (MILK OF MAGNESIA) 400 MG/5ML SUSPENSION    Take 30 mLs by mouth daily as needed for mild constipation.   METOPROLOL SUCCINATE (TOPROL-XL) 100 MG 24 HR TABLET    TAKE ONE TABLET BY MOUTH ONCE DAILY.   METRONIDAZOLE (METROCREAM) 0.75 % CREAM    Apply 1 application topically daily.   MULTIPLE VITAMIN (MULTIVITAMIN WITH MINERALS) TABS TABLET    Take 1 tablet by mouth daily.   ONDANSETRON (ZOFRAN) 4 MG TABLET    Take 1 tablet (4 mg total) by mouth every 8 (eight) hours as needed for nausea or vomiting.   OXYCODONE-ACETAMINOPHEN (PERCOCET/ROXICET) 5-325 MG TABLET    Take 1  tablet by mouth every 6 (six) hours as needed for moderate pain.   POLYETHYLENE GLYCOL (MIRALAX / GLYCOLAX) PACKET    Take 17 g by mouth 2 (two) times daily.   PROBIOTIC PRODUCT (PROBIOTIC DAILY PO)    Take 1 tablet by mouth daily.   SERTRALINE (ZOLOFT) 100 MG TABLET    Take 1 tablet (100 mg total) by mouth at bedtime.   SODIUM PHOSPHATES (RA SALINE ENEMA RE)    Place 1 each rectally as needed (for constipation).  Modified Medications   No medications on file  Discontinued Medications   No medications on file    Review of Systems  Unable to perform ROS: Other  pt very HOH  Filed Vitals:   05/03/15 1609  BP: 137/73  Pulse: 92  Temp: 97.4 F (36.3 C)  Weight: 160 lb (72.576 kg)  SpO2: 94%   Body mass index is 24.33 kg/(m^2).  Physical Exam  Constitutional: She appears well-developed.  Frail appearing in NAD, lying in bed  HENT:  Mouth/Throat: Oropharynx is clear and moist. No oropharyngeal exudate.  HOH  Eyes: Pupils are equal, round, and reactive to light. No scleral icterus.  Neck: Neck supple. Carotid bruit is not present. No tracheal deviation present. No thyromegaly present.  Cardiovascular: Normal rate, regular rhythm and intact distal pulses.  Exam reveals no gallop and no friction rub.   Murmur (1/6 SEM) heard. No LE edema b/l. no calf TTP.   Pulmonary/Chest: Effort normal and breath sounds normal. No stridor. No respiratory distress. She has no wheezes. She has no rales.  Markedly reduced BS at left base; left lower ACW pleurix intact and no signs of secondary infection  Abdominal: Soft. Bowel sounds are normal. She exhibits distension. She exhibits no mass. There is no hepatomegaly. There is tenderness (epigastric). There is no rebound and no guarding.  Musculoskeletal: She exhibits edema and tenderness.  Gait unsteady  Lymphadenopathy:    She has no cervical adenopathy.  Neurological: She is alert.  Skin: Skin is warm and dry. No rash noted.  Psychiatric: She  has a normal mood and affect. Her behavior is normal.     Labs reviewed: Admission on 04/28/2015, Discharged on 04/30/2015  Component Date Value Ref Range Status  . Lactic Acid, Venous 04/28/2015 0.65  0.5 - 2.0 mmol/L Final  . Sodium 04/28/2015 138  135 - 145 mmol/L Final  . Potassium 04/28/2015 5.3* 3.5 - 5.1 mmol/L Final  . Chloride 04/28/2015 103  101 - 111 mmol/L Final  . CO2 04/28/2015 27  22 - 32 mmol/L Final  . Glucose, Bld 04/28/2015 130* 65 - 99 mg/dL Final  . BUN 04/28/2015 17  6 - 20 mg/dL Final  . Creatinine, Ser 04/28/2015 1.49* 0.44 - 1.00 mg/dL Final  . Calcium 04/28/2015 8.3* 8.9 - 10.3 mg/dL Final  . Total Protein 04/28/2015 5.1* 6.5 - 8.1 g/dL Final  . Albumin 04/28/2015 2.5* 3.5 - 5.0 g/dL Final  . AST 04/28/2015 13* 15 - 41 U/L Final  . ALT 04/28/2015 10* 14 - 54 U/L Final  . Alkaline Phosphatase 04/28/2015 59  38 - 126 U/L Final  . Total Bilirubin 04/28/2015 0.3  0.3 - 1.2 mg/dL Final  . GFR calc non Af Amer 04/28/2015 28* >60 mL/min Final  . GFR calc Af Amer 04/28/2015 33* >60 mL/min Final   Comment: (NOTE) The eGFR has been calculated using the CKD EPI equation. This calculation has not been validated in all clinical situations. eGFR's persistently <60 mL/min signify possible Chronic Kidney Disease.   . Anion gap 04/28/2015 8  5 - 15 Final  . Specimen Description 04/28/2015 BLOOD RIGHT WRIST   Final  . Special Requests 04/28/2015 BOTTLES DRAWN AEROBIC AND ANAEROBIC 5CC    Final  . Culture 04/28/2015 NO GROWTH 5 DAYS   Final  . Report Status 04/28/2015 05/03/2015 FINAL   Final  . Specimen Description 04/28/2015 BLOOD LEFT WRIST   Final  . Special Requests 04/28/2015 BOTTLES DRAWN AEROBIC AND ANAEROBIC 10CC    Final  . Culture 04/28/2015 NO GROWTH 5 DAYS   Final  . Report Status 04/28/2015 05/03/2015 FINAL   Final  . Color, Urine 04/28/2015 YELLOW  YELLOW Final  . APPearance 04/28/2015 TURBID* CLEAR Final  . Specific Gravity, Urine 04/28/2015 1.013   1.005 - 1.030 Final  . pH 04/28/2015 6.0  5.0 - 8.0 Final  . Glucose, UA 04/28/2015 NEGATIVE  NEGATIVE mg/dL Final  . Hgb urine dipstick 04/28/2015 MODERATE* NEGATIVE Final  . Bilirubin Urine 04/28/2015 NEGATIVE  NEGATIVE Final  . Ketones, ur 04/28/2015 NEGATIVE  NEGATIVE mg/dL Final  . Protein, ur 04/28/2015 30* NEGATIVE mg/dL Final  . Nitrite 04/28/2015 NEGATIVE  NEGATIVE Final  . Leukocytes, UA 04/28/2015 LARGE* NEGATIVE Final  . Specimen Description 04/28/2015 URINE, CATHETERIZED   Final  . Special Requests 04/28/2015 NONE   Final  . Culture 04/28/2015 >=100,000 COLONIES/mL ENTEROCOCCUS SPECIES   Final  . Report Status 04/28/2015 04/30/2015 FINAL   Final  . Organism ID, Bacteria 04/28/2015 ENTEROCOCCUS SPECIES   Final  . WBC 04/28/2015 14.7* 4.0 - 10.5 K/uL Final  . RBC 04/28/2015 3.76* 3.87 - 5.11 MIL/uL Final  . Hemoglobin 04/28/2015 11.3* 12.0 - 15.0 g/dL Final  . HCT 04/28/2015 34.5* 36.0 - 46.0 % Final  . MCV 04/28/2015 91.8  78.0 - 100.0 fL Final  . MCH 04/28/2015 30.1  26.0 - 34.0 pg Final  . MCHC 04/28/2015 32.8  30.0 - 36.0 g/dL Final  .  RDW 04/28/2015 13.6  11.5 - 15.5 % Final  . Platelets 04/28/2015 224  150 - 400 K/uL Final  . Neutrophils Relative % 04/28/2015 81   Final  . Neutro Abs 04/28/2015 12.0* 1.7 - 7.7 K/uL Final  . Lymphocytes Relative 04/28/2015 9   Final  . Lymphs Abs 04/28/2015 1.3  0.7 - 4.0 K/uL Final  . Monocytes Relative 04/28/2015 9   Final  . Monocytes Absolute 04/28/2015 1.3* 0.1 - 1.0 K/uL Final  . Eosinophils Relative 04/28/2015 1   Final  . Eosinophils Absolute 04/28/2015 0.1  0.0 - 0.7 K/uL Final  . Basophils Relative 04/28/2015 0   Final  . Basophils Absolute 04/28/2015 0.0  0.0 - 0.1 K/uL Final  . Fluid Type-FCT 04/28/2015 PLEURAL   Corrected   Comment: LEFT FLUID CORRECTED ON 01/28 AT 0145: PREVIOUSLY REPORTED AS Body Fluid   . Color, Fluid 04/28/2015 YELLOW   Final  . Appearance, Fluid 04/28/2015 HAZY* CLEAR Final  . WBC, Fluid  04/28/2015 1168* 0 - 1000 cu mm Final  . Neutrophil Count, Fluid 04/28/2015 5  0 - 25 % Final  . Lymphs, Fluid 04/28/2015 82   Final  . Monocyte-Macrophage-Serous Fluid 04/28/2015 13* 50 - 90 % Final  . Specimen Description 04/28/2015 PLEURAL LEFT FLUID   Final  . Special Requests 04/28/2015 NONE   Final  . Gram Stain 04/28/2015    Final                   Value:FEW WBC PRESENT, PREDOMINANTLY MONONUCLEAR NO ORGANISMS SEEN   . Report Status 04/28/2015 04/28/2015 FINAL   Final  . Procalcitonin 04/28/2015 <0.10   Final   Comment:        Interpretation: PCT (Procalcitonin) <= 0.5 ng/mL: Systemic infection (sepsis) is not likely. Local bacterial infection is possible. (NOTE)         ICU PCT Algorithm               Non ICU PCT Algorithm    ----------------------------     ------------------------------         PCT < 0.25 ng/mL                 PCT < 0.1 ng/mL     Stopping of antibiotics            Stopping of antibiotics       strongly encouraged.               strongly encouraged.    ----------------------------     ------------------------------       PCT level decrease by               PCT < 0.25 ng/mL       >= 80% from peak PCT       OR PCT 0.25 - 0.5 ng/mL          Stopping of antibiotics                                             encouraged.     Stopping of antibiotics           encouraged.    ----------------------------     ------------------------------       PCT level decrease by              PCT >= 0.25 ng/mL       <  80% from peak PCT        AND PCT >= 0.5 ng/mL            Continuin                          g antibiotics                                              encouraged.       Continuing antibiotics            encouraged.    ----------------------------     ------------------------------     PCT level increase compared          PCT > 0.5 ng/mL         with peak PCT AND          PCT >= 0.5 ng/mL             Escalation of antibiotics                                           strongly encouraged.      Escalation of antibiotics        strongly encouraged.   Marland Kitchen Squamous Epithelial / LPF 04/28/2015 0-5* NONE SEEN Final  . WBC, UA 04/28/2015 TOO NUMEROUS TO COUNT  0 - 5 WBC/hpf Final  . RBC / HPF 04/28/2015 6-30  0 - 5 RBC/hpf Final  . Bacteria, UA 04/28/2015 MANY* NONE SEEN Final  . Urine-Other 04/28/2015 MUCOUS PRESENT   Final  . Specimen Description 04/28/2015 FLUID   Final  . Special Requests 04/28/2015 PLEURAL AEROBIC BOTTLE ONLY   Final  . Culture 04/28/2015 NO GROWTH 5 DAYS   Final  . Report Status 04/28/2015 05/03/2015 FINAL   Final  . Creatinine, Urine 04/29/2015 57.11   Final  . B Natriuretic Peptide 04/28/2015 54.5  0.0 - 100.0 pg/mL Final  . Lactic Acid, Venous 04/28/2015 1.2  0.5 - 2.0 mmol/L Final  . Lactic Acid, Venous 04/28/2015 0.8  0.5 - 2.0 mmol/L Final  . Prothrombin Time 04/28/2015 16.3* 11.6 - 15.2 seconds Final  . INR 04/28/2015 1.29  0.00 - 1.49 Final  . aPTT 04/28/2015 41* 24 - 37 seconds Final   Comment:        IF BASELINE aPTT IS ELEVATED, SUGGEST PATIENT RISK ASSESSMENT BE USED TO DETERMINE APPROPRIATE ANTICOAGULANT THERAPY.   . Strep Pneumo Urinary Antigen 04/29/2015 NEGATIVE  NEGATIVE Final   Comment:        Infection due to S. pneumoniae cannot be absolutely ruled out since the antigen present may be below the detection limit of the test.   . Specimen Description 04/29/2015 URINE, CLEAN CATCH   Final  . Special Requests 04/29/2015 NONE   Final  . Legionella Antigen, Urine 04/29/2015    Final                   Value:Negative for Legionella pneumophila serogroup 1  Legionella pneumophila serogroup 1 antigen can be detected in urine within 2 to 3 days of infection and may persist even after treatment. This  assay does not detect other Legionella species or serogroups. Performed at [facility]   . Report Status 04/29/2015 04/30/2015 FINAL   Final  .  Influenza A By PCR 04/28/2015 NEGATIVE  NEGATIVE Final  . Influenza B By PCR 04/28/2015 NEGATIVE  NEGATIVE Final  . H1N1 flu by pcr 04/28/2015 NOT DETECTED  NOT DETECTED Final   Comment:        The Xpert Flu assay (FDA approved for nasal aspirates or washes and nasopharyngeal swab specimens), is intended as an aid in the diagnosis of influenza and should not be used as a sole basis for treatment.   Marland Kitchen MRSA by PCR 04/28/2015 NEGATIVE  NEGATIVE Final   Comment:        The GeneXpert MRSA Assay (FDA approved for NASAL specimens only), is one component of a comprehensive MRSA colonization surveillance program. It is not intended to diagnose MRSA infection nor to guide or monitor treatment for MRSA infections.   . Sodium 04/29/2015 134* 135 - 145 mmol/L Final  . Potassium 04/29/2015 4.1  3.5 - 5.1 mmol/L Final   DELTA CHECK NOTED  . Chloride 04/29/2015 102  101 - 111 mmol/L Final  . CO2 04/29/2015 24  22 - 32 mmol/L Final  . Glucose, Bld 04/29/2015 125* 65 - 99 mg/dL Final  . BUN 04/29/2015 14  6 - 20 mg/dL Final  . Creatinine, Ser 04/29/2015 0.75  0.44 - 1.00 mg/dL Final  . Calcium 04/29/2015 8.2* 8.9 - 10.3 mg/dL Final  . GFR calc non Af Amer 04/29/2015 >60  >60 mL/min Final  . GFR calc Af Amer 04/29/2015 >60  >60 mL/min Final   Comment: (NOTE) The eGFR has been calculated using the CKD EPI equation. This calculation has not been validated in all clinical situations. eGFR's persistently <60 mL/min signify possible Chronic Kidney Disease.   . Anion gap 04/29/2015 8  5 - 15 Final  . WBC 04/29/2015 13.7* 4.0 - 10.5 K/uL Final  . RBC 04/29/2015 4.12  3.87 - 5.11 MIL/uL Final  . Hemoglobin 04/29/2015 12.0  12.0 - 15.0 g/dL Final  . HCT 04/29/2015 37.8  36.0 - 46.0 % Final  . MCV 04/29/2015 91.7  78.0 - 100.0 fL Final  . MCH 04/29/2015 29.1  26.0 - 34.0 pg Final  . MCHC 04/29/2015 31.7  30.0 - 36.0 g/dL Final  . RDW 04/29/2015 13.5  11.5 - 15.5 % Final  . Platelets 04/29/2015  245  150 - 400 K/uL Final  . Sodium 04/30/2015 136  135 - 145 mmol/L Final  . Potassium 04/30/2015 4.1  3.5 - 5.1 mmol/L Final  . Chloride 04/30/2015 104  101 - 111 mmol/L Final  . CO2 04/30/2015 23  22 - 32 mmol/L Final  . Glucose, Bld 04/30/2015 132* 65 - 99 mg/dL Final  . BUN 04/30/2015 13  6 - 20 mg/dL Final  . Creatinine, Ser 04/30/2015 0.68  0.44 - 1.00 mg/dL Final  . Calcium 04/30/2015 8.5* 8.9 - 10.3 mg/dL Final  . GFR calc non Af Amer 04/30/2015 >60  >60 mL/min Final  . GFR calc Af Amer 04/30/2015 >60  >60 mL/min Final   Comment: (NOTE) The eGFR has been calculated using the CKD EPI equation. This calculation has not been validated in all clinical situations. eGFR's persistently <60 mL/min signify possible Chronic Kidney Disease.   Marland Kitchen  Anion gap 04/30/2015 9  5 - 15 Final  . WBC 04/30/2015 13.3* 4.0 - 10.5 K/uL Final  . RBC 04/30/2015 4.00  3.87 - 5.11 MIL/uL Final  . Hemoglobin 04/30/2015 11.7* 12.0 - 15.0 g/dL Final  . HCT 04/30/2015 36.1  36.0 - 46.0 % Final  . MCV 04/30/2015 90.3  78.0 - 100.0 fL Final  . MCH 04/30/2015 29.3  26.0 - 34.0 pg Final  . MCHC 04/30/2015 32.4  30.0 - 36.0 g/dL Final  . RDW 04/30/2015 13.2  11.5 - 15.5 % Final  . Platelets 04/30/2015 259  150 - 400 K/uL Final  . Path Review 04/28/2015 Blood and atypical cells.   Final   Comment: Suggest cytologic workup. Reviewed by Chrystie Nose. Saralyn Pilar, M.D. 05/01/2015     Dg Chest 2 View  05/02/2015  CLINICAL DATA:  Malignant neoplasm of lung. EXAM: CHEST  2 VIEW COMPARISON:  Radiograph of April 29, 2015. CT scan of January 18, 2014. FINDINGS: Stable cardiomediastinal silhouette. No pneumothorax is noted. Right lung is clear. Increased left basilar opacity is noted concerning for atelectasis or infiltrate with associated mild pleural effusion. Rounded left apical density is noted concerning for malignancy. Bony thorax is unremarkable. IMPRESSION: Rounded left apical density is noted consistent with history of  malignancy. Increased left basilar opacity is noted concerning for worsening atelectasis or infiltrate with mild associated pleural effusion. Electronically Signed   By: Marijo Conception, M.D.   On: 05/02/2015 14:37   Dg Chest 2 View  04/11/2015  CLINICAL DATA:  Pleural effusion.  Wheezing.  Shortness of breath. EXAM: CHEST  2 VIEW COMPARISON:  03/23/2015. FINDINGS: Consolidation of the lower half of the left hemi thorax is noted consistent with left lower lobe infiltrate with prominent left pleural effusion. Right lung is clear. Cardiomegaly with normal pulmonary vascularity. No acute bony abnormality. IMPRESSION: Of the Opacification lower half of the left hemi thorax consistent with left lower lobe consolidation and left pleural effusion . Electronically Signed   By: Marcello Moores  Register   On: 04/11/2015 12:22   Dg Chest Port 1 View  04/29/2015  CLINICAL DATA:  Cough EXAM: PORTABLE CHEST 1 VIEW COMPARISON:  04/28/2015 FINDINGS: Cardiac shadow is stable. The lungs are well aerated bilaterally. Mild perihilar changes are again seen and stable. No new focal infiltrate is seen no effusions are noted. IMPRESSION: Stable perihilar infiltrates. Electronically Signed   By: Inez Catalina M.D.   On: 04/29/2015 07:44   Dg Chest Port 1 View  04/28/2015  CLINICAL DATA:  Code sepsis. EXAM: PORTABLE CHEST 1 VIEW COMPARISON:  04/13/2015 FINDINGS: Shallow inspiration. Mild cardiac enlargement. Mild pulmonary vascular congestion. Mild perihilar infiltration may represent edema or pneumonia. Probable tiny left pleural effusion. No pneumothorax. Calcified and ectatic aorta. Surgical clips in the right upper quadrant. IMPRESSION: Cardiac enlargement with mild pulmonary vascular congestion. Perihilar infiltrates may represent edema or pneumonia. Tiny left pleural effusion. Electronically Signed   By: Lucienne Capers M.D.   On: 04/28/2015 01:55   Dg Chest Port 1 View  04/13/2015  CLINICAL DATA:  PleurX drainage catheter  placement EXAM: DG C-ARM 1-60 MIN-NO REPORT; PORTABLE CHEST - 1 VIEW COMPARISON:  Chest x-ray 04/11/2015 FINDINGS: Interval resolution of the large left-sided pleural effusion with a left-sided PleurX drainage catheter in place. Chronic lung changes and areas of atelectasis. No pneumothorax. IMPRESSION: Left-sided PleurX drainage catheter in place with resolution of the large left pleural effusion. Electronically Signed   By: Ricky Stabs.D.  On: 04/13/2015 12:07   Dg C-arm 1-60 Min-no Report  04/13/2015  CLINICAL DATA: surgery C-ARM 1-60 MINUTES Fluoroscopy was utilized by the requesting physician.  No radiographic interpretation.     Assessment/Plan   ICD-9-CM ICD-10-CM   1. Nausea without vomiting 787.02 R11.0   2. Malignant neoplasm of left lung, unspecified part of lung (HCC) 162.9 C34.92   3. Chronic diastolic congestive heart failure (HCC) 428.32 I50.32    428.0    4. Gastroesophageal reflux disease, esophagitis presence not specified 530.81 K21.9   5. Orthostatic hypotension 458.0 I95.1   6. Pleural effusion, left 511.9 J94.8   7. Hereditary and idiopathic peripheral neuropathy 356.9 G60.9   8. Localized edema 782.3 R60.0   9. Hearing loss, bilateral 389.9 H91.93     Consulted with palliative care today. Check C diff toxinif diarrhea continues. Stop lasix for now and use prn. Change potassium to prn with lasix. No lab draws. Comfort measures only. Phenergan dose reduced by palliative care. Will start her on gaviscon tabs take 1 po q4hrs prn gas/bloating. Reduce sertraline to '100mg'$  qhs  meds to be adjusted according to her symptoms. Amoxil last dose in AM. Palliative care to start tylenol for pain   She is scheduled to have pleural drain cath removed next week  Family is considering hospice care. She has a poor prognosis overall due to lung cancer. Goal is to keep her comfortable at this time. Discussed with pt, daughter and nursing  Will follow  Lavalle Skoda S. Perlie Gold  [facility] [address] [place], [place] [address] [phone] Cell (Monday-Friday 8 AM - 5 PM) [phone] After 5 PM and follow prompts

## 2015-05-04 ENCOUNTER — Encounter: Payer: Self-pay | Admitting: Surgery

## 2015-05-04 NOTE — H&P (Signed)
HPI:  Patient returns today for follow up of her left PleurX catheter inserted for a malignant left pleural effusion. Since I saw her last week the catheter has continued to have minimal drainage. Her main complaint is that she has nausea and no appetite. She is still st a SNF and her daughter came with her today. The catheter was drained in the office today and there is still minimal drainage.  Current Outpatient Prescriptions  Medication Sig Dispense Refill  . acetaminophen (TYLENOL) 325 MG tablet Take 1-2 tablets (325-650 mg total) by mouth every 4 (four) hours as needed for mild pain.    Marland Kitchen amoxicillin (AMOXIL) 500 MG capsule Take 1 capsule (500 mg total) by mouth every 12 (twelve) hours. 14 capsule 0  . bisacodyl (DULCOLAX) 10 MG suppository Place 10 mg rectally as needed for moderate constipation.    . docusate sodium (COLACE) 100 MG capsule Take 1 capsule (100 mg total) by mouth 2 (two) times daily as needed for mild constipation. 10 capsule 0  . furosemide (LASIX) 20 MG tablet Take 20 mg by mouth daily.  3  . KLOR-CON M20 20 MEQ tablet Take 40 mEq by mouth daily.  6  . lactulose (CHRONULAC) 10 GM/15ML solution Take 20 g by mouth daily as needed for mild constipation.    Marland Kitchen losartan (COZAAR) 50 MG tablet TAKE 1 TABLET (50 MG TOTAL) BY MOUTH DAILY. 30 tablet 9  . magnesium hydroxide (MILK OF MAGNESIA) 400 MG/5ML suspension Take 30 mLs by mouth daily as needed for mild constipation.    . metoprolol succinate (TOPROL-XL) 100 MG 24 hr tablet TAKE ONE TABLET BY MOUTH ONCE DAILY. (Patient taking differently: TAKE 100 MG BY MOUTH ONCE DAILY) 30 tablet 5  . metroNIDAZOLE (METROCREAM) 0.75 % cream Apply 1 application topically daily.    . Multiple Vitamin (MULTIVITAMIN WITH MINERALS) TABS tablet Take 1 tablet by mouth daily.    . ondansetron (ZOFRAN) 4 MG tablet Take 1 tablet (4 mg total) by mouth every 8 (eight) hours as needed for nausea or vomiting. 20 tablet 0  . oxyCODONE-acetaminophen  (PERCOCET/ROXICET) 5-325 MG tablet Take 1 tablet by mouth every 6 (six) hours as needed for moderate pain. 30 tablet 0  . polyethylene glycol (MIRALAX / GLYCOLAX) packet Take 17 g by mouth 2 (two) times daily. 14 each 0  . Probiotic Product (PROBIOTIC DAILY PO) Take 1 tablet by mouth daily.    . sertraline (ZOLOFT) 100 MG tablet Take 1 tablet (100 mg total) by mouth at bedtime. 30 tablet 3  . Sodium Phosphates (RA SALINE ENEMA RE) Place 1 each rectally as needed (for constipation).    . feeding supplement (BOOST / RESOURCE BREEZE) LIQD Take 1 Container by mouth 3 (three) times daily between meals. (Patient not taking: Reported on 05/02/2015)  0   No current facility-administered medications for this visit.     Physical Exam: BP 157/85 mmHg  Pulse 86  Resp 16  Ht '5\' 8"'$  (1.727 m)  Wt 175 lb (79.379 kg)  BMI 26.61 kg/m2  SpO2 96% She looks well for her age. The lungs are clear Catheter site looks good.  Diagnostic Tests:  CLINICAL DATA: Malignant neoplasm of lung.  EXAM: CHEST 2 VIEW  COMPARISON: Radiograph of April 29, 2015. CT scan of January 18, 2014.  FINDINGS: Stable cardiomediastinal silhouette. No pneumothorax is noted. Right lung is clear. Increased left basilar opacity is noted concerning for atelectasis or infiltrate with associated mild pleural effusion. Rounded  left apical density is noted concerning for malignancy. Bony thorax is unremarkable.  IMPRESSION: Rounded left apical density is noted consistent with history of malignancy. Increased left basilar opacity is noted concerning for worsening atelectasis or infiltrate with mild associated pleural effusion.   Electronically Signed  By: Marijo Conception, M.D.  On: 05/02/2015 14:37  Impression:  There is minimal drainage from the catheter and the CXR looks stable with no significant effusion on the left. The catheter can be removed.  Plan:  Schedule removal of PleurX catheter in Short  Stay.   [name], MD [facility] [phone]

## 2015-05-07 LAB — BASIC METABOLIC PANEL
BUN: 23 mg/dL — AB (ref 4–21)
Creatinine: 0.6 mg/dL (ref 0.5–1.1)
Glucose: 120 mg/dL
Potassium: 3.4 mmol/L (ref 3.4–5.3)
Sodium: 138 mmol/L (ref 137–147)

## 2015-05-07 LAB — CBC AND DIFFERENTIAL
HEMATOCRIT: 39 % (ref 36–46)
Hemoglobin: 12.5 g/dL (ref 12.0–16.0)
PLATELETS: 359 10*3/uL (ref 150–399)
WBC: 13 10^3/mL

## 2015-05-08 ENCOUNTER — Encounter (HOSPITAL_COMMUNITY)
Admission: RE | Disposition: A | Discharge status: Skilled Nursing Facility | Payer: Self-pay | Source: Ambulatory Visit | Attending: Surgery

## 2015-05-08 ENCOUNTER — Ambulatory Visit (HOSPITAL_COMMUNITY)
Admission: RE | Admit: 2015-05-08 | Discharge: 2015-05-08 | Disposition: A | Discharge status: Skilled Nursing Facility | Payer: Medicare Other | Source: Ambulatory Visit | Attending: Surgery | Admitting: Surgery

## 2015-05-08 DIAGNOSIS — Z4689 Encounter for fitting and adjustment of other specified devices: Secondary | ICD-10-CM | POA: Diagnosis not present

## 2015-05-08 DIAGNOSIS — G629 Polyneuropathy, unspecified: Secondary | ICD-10-CM | POA: Diagnosis not present

## 2015-05-08 DIAGNOSIS — Z79899 Other long term (current) drug therapy: Secondary | ICD-10-CM | POA: Insufficient documentation

## 2015-05-08 DIAGNOSIS — K219 Gastro-esophageal reflux disease without esophagitis: Secondary | ICD-10-CM | POA: Diagnosis not present

## 2015-05-08 DIAGNOSIS — J90 Pleural effusion, not elsewhere classified: Secondary | ICD-10-CM

## 2015-05-08 DIAGNOSIS — I10 Essential (primary) hypertension: Secondary | ICD-10-CM | POA: Diagnosis not present

## 2015-05-08 DIAGNOSIS — J910 Malignant pleural effusion: Secondary | ICD-10-CM | POA: Diagnosis not present

## 2015-05-08 DIAGNOSIS — M1990 Unspecified osteoarthritis, unspecified site: Secondary | ICD-10-CM | POA: Diagnosis not present

## 2015-05-08 HISTORY — PX: REMOVAL OF PLEURAL DRAINAGE CATHETER: SHX5080

## 2015-05-08 SURGERY — REMOVAL, CLOSED DRAINAGE CATHETER SYSTEM, PLEURAL
Anesthesia: Monitor Anesthesia Care | Laterality: Left

## 2015-05-08 MED ORDER — LIDOCAINE HCL (PF) 1 % IJ SOLN
INTRAMUSCULAR | Status: AC
  Filled 2015-05-08: qty 30

## 2015-05-08 NOTE — Procedures (Signed)
[name] is a 81 y.o. female patient.  [name] is a 82 yo female who underwent placement of a Left Sided Pleur-x catheter for recurrent malignant pleural effusion.  This was done on 04/13/2015 by Dr. [name].  Since that time patients catheter has been routinely drained by her nursing home staff.  She has been followed by Dr. [name] who last saw the patient on 05/04/2015.  At that visit the patient was doing well.  Her pleur-x catheter had minimal drainage and it was felt it was safe to remove.  The risks and benefits of the procedure were explained to the patient and she was agreeable to proceed.  She presents to Short Stay today 05/08/2015 with no complaints.  She is nervous about the procedure to remove her catheter.  There has been no change in her medical history since her last evaluation on 05/04/2015.  No diagnosis found. Past Medical History  Diagnosis Date  . History of hiatal hernia   . Mitral valve prolapse   . Plantar fasciitis   . Hay fever   . GERD (gastroesophageal reflux disease)   . Palpitations   . Neuromuscular disorder ([place])   . Deafness     "very HOH; even w/hearing aides we have to communicate via writing things down for her to read" (08/25/2012)  . H/O hiatal hernia   . Arthritis     "a little all over" (08/25/2012)  . Lung mass     Hx: of  . Anxiety   . Hemorrhoids   . Neuropathy ([place])   . Hypertension   . Hyperglycemia    Blood pressure 145/65, pulse 75, temperature 97.7 F (36.5 C), temperature source Oral, resp. rate 16, height '5\' 7"'$  (1.702 m), weight 160 lb (72.576 kg), SpO2 97 %.  Procedures   Chest Tube Removal  Pre Procedure Diagnosis- Malignant Pleural Effusion  Post Procedure Diagnosis: Same  Surgical time out was performed.  Her left chest was prepped and draped in sterile fashion.  A suture holding her Pleur-x catheter in place was cut and removed.  The area was anesthesized with 10 ml of 1 % Xylocaine without epinephrine.  Once anesthesia  was achieved blunt dissection to remove adhesions from the cuff of the Pleur-x catheter was performed.  Once cuff was entirely free the Pleur-x was removed without difficulty.  The wound was closed with two 3-0 interrupted Nylon sutures.  The wound was cleaned and a dry dressing was placed.  The patient tolerated the procedure without difficulty.   Blood Loss: minimal  Disposition- stable condition, return to SNF, will follow up at [facility] on 05/16/2015 at 10:00 with a CXR 30 min prior to your appointment for suture removal.   Rayleen Wyrick 05/08/2015

## 2015-05-08 NOTE — Discharge Instructions (Addendum)
Pt. Will return to [facility] facility. She was given written instructions to provide for her nurse.

## 2015-05-09 ENCOUNTER — Encounter (HOSPITAL_COMMUNITY): Payer: Self-pay | Admitting: Surgery

## 2015-05-10 ENCOUNTER — Telehealth: Payer: Self-pay | Admitting: Internal Medicine

## 2015-05-14 ENCOUNTER — Other Ambulatory Visit: Payer: Self-pay | Admitting: *Deleted

## 2015-05-14 DIAGNOSIS — J90 Pleural effusion, not elsewhere classified: Secondary | ICD-10-CM

## 2015-05-14 DIAGNOSIS — C3492 Malignant neoplasm of unspecified part of left bronchus or lung: Secondary | ICD-10-CM

## 2015-05-14 MED ORDER — OXYCODONE-ACETAMINOPHEN 5-325 MG PO TABS: ORAL_TABLET | ORAL | Status: DC

## 2015-05-14 NOTE — Telephone Encounter (Signed)
[facility] 

## 2015-05-15 ENCOUNTER — Ambulatory Visit
Admission: RE | Admit: 2015-05-15 | Discharge: 2015-05-15 | Disposition: A | Discharge status: Home / Self Care | Payer: Medicare Other | Source: Ambulatory Visit | Attending: Surgery | Admitting: Surgery

## 2015-05-15 ENCOUNTER — Ambulatory Visit (INDEPENDENT_AMBULATORY_CARE_PROVIDER_SITE_OTHER): Admitting: *Deleted

## 2015-05-15 DIAGNOSIS — Z4802 Encounter for removal of sutures: Secondary | ICD-10-CM

## 2015-05-15 DIAGNOSIS — C3492 Malignant neoplasm of unspecified part of left bronchus or lung: Secondary | ICD-10-CM | POA: Diagnosis not present

## 2015-05-15 DIAGNOSIS — J948 Other specified pleural conditions: Secondary | ICD-10-CM | POA: Diagnosis not present

## 2015-05-15 DIAGNOSIS — Z09 Encounter for follow-up examination after completed treatment for conditions other than malignant neoplasm: Secondary | ICD-10-CM

## 2015-05-15 DIAGNOSIS — J90 Pleural effusion, not elsewhere classified: Secondary | ICD-10-CM

## 2015-05-15 NOTE — Progress Notes (Signed)
[name] returns s/p removal of left pleurX catheter on 05/08/15 for suture removal.  Two sutures were easily removed and the site is very well healed.  She had a chest xray which showed no significant change and no re-accumulation of pleural fluid. She will return as needed.  She resides in a skilled nursing facility and will soon be under hospice care.

## 2015-06-01 ENCOUNTER — Non-Acute Institutional Stay (SKILLED_NURSING_FACILITY): Payer: Medicare Other | Admitting: Nurse Practitioner

## 2015-06-01 ENCOUNTER — Encounter: Payer: Self-pay | Admitting: Nurse Practitioner

## 2015-06-01 DIAGNOSIS — K5901 Slow transit constipation: Secondary | ICD-10-CM

## 2015-06-01 DIAGNOSIS — N898 Other specified noninflammatory disorders of vagina: Secondary | ICD-10-CM

## 2015-06-01 DIAGNOSIS — C3492 Malignant neoplasm of unspecified part of left bronchus or lung: Secondary | ICD-10-CM

## 2015-06-01 DIAGNOSIS — G609 Hereditary and idiopathic neuropathy, unspecified: Secondary | ICD-10-CM

## 2015-06-01 DIAGNOSIS — G894 Chronic pain syndrome: Secondary | ICD-10-CM

## 2015-06-01 NOTE — Progress Notes (Signed)
Patient ID: [name], female   DOB: [birth year], 82 y.o.   MRN: [id number]    Nursing Home Location:  [place] of Service: SNF (31)  PCP: REED, TIFFANY, DO  Allergies  Allergen Reactions  . Aleve [Naproxen Sodium] Swelling    Legs and feet  . Statins Other (See Comments)    myopathy    Chief Complaint  Patient presents with  . Medical Management of Chronic Issues    Routine Visit    HPI:  Patient is a 82 y.o. female seen today at [facility] for routine follow up. Pt with a hx of hearing loss, GERD, chronic pain, CAD, HTN, lung cancer. Pt currently on hospice services due to lung CA with malignant left pleural effusion. Pt recently with increase constipation. PRN medication given causing diarrhea.  Pain medication recently increased due to ongoing pain.  Pt reporting increase in neuropathy. No worsening shortness of breath or chest pains.   Review of Systems:  Review of Systems  Constitutional: Negative for fever and chills.  HENT: Positive for hearing loss.   Eyes:       Glasses  Respiratory: Negative for shortness of breath.   Cardiovascular: Negative for chest pain.  Gastrointestinal: Positive for constipation (with diarrhea after PRN medication). Negative for abdominal pain and diarrhea.  Genitourinary: Negative for dysuria.  Musculoskeletal: Positive for back pain.  Neurological: Positive for weakness and numbness (increase pain/neuropathy to LE). Negative for dizziness.  Psychiatric/Behavioral: Negative for agitation. The patient is not nervous/anxious.        Some confusion    Past Medical History  Diagnosis Date  . History of hiatal hernia   . Mitral valve prolapse   . Plantar fasciitis   . Hay fever   . GERD (gastroesophageal reflux disease)   . Palpitations   . Neuromuscular disorder ([place])   . Deafness     "very HOH; even w/hearing aides we have to communicate via writing things down for her to read"  (08/25/2012)  . H/O hiatal hernia   . Arthritis     "a little all over" (08/25/2012)  . Lung mass     Hx: of  . Anxiety   . Hemorrhoids   . Neuropathy ([place])   . Hypertension   . Hyperglycemia    Past Surgical History  Procedure Laterality Date  . Foot surgery Bilateral ~ 2000    "pinched nerve on feet" (08/25/2012)  . Vesicovaginal fistula closure w/ tah    . Abdominal hysterectomy  1998  . Tonsillectomy and adenoidectomy  1940's  . Cesarean section  1952  . Knee arthroscopy w/ meniscectomy Left 07/15/2010    partial medial and lateral meniscectomies.Archie Endo 07/15/2010 (08/25/2012)  . Hip arthroplasty Right 08/26/2012    Procedure: ARTHROPLASTY BIPOLAR HIP;  Surgeon: Mauri Pole, MD;  Location: [facility];  Service: Orthopedics;  Laterality: Right;  . Open reduction internal fixation (orif) distal radial fracture Right 09/11/2012    Procedure: OPEN REDUCTION INTERNAL FIXATION (ORIF) RIGHT DISTAL RADIAL FRACTURE/ULNAR POSSIBLE BONE GRAFTING;  Surgeon: Linna Hoff, MD;  Location: [facility];  Service: Orthopedics;  Laterality: Right;  . Cataract extraction, bilateral Bilateral   . Laparoscopic cholecystectomy  03/19/2015  . Cholecystectomy N/A 03/19/2015    Procedure: LAPAROSCOPIC CHOLECYSTECTOMY WITH INTRAOPERATIVE CHOLANGIOGRAM;  Surgeon: Donnie Mesa, MD;  Location: [facility];  Service: General;  Laterality: N/A;  . Chest tube insertion Left 04/13/2015    Procedure: INSERTION PLEURAL DRAINAGE CATHETER;  Surgeon: [name], MD;  [facility];  Service: Thoracic;  Laterality: Left;  . Removal of pleural drainage catheter Left 05/08/2015    Procedure: REMOVAL OF PLEURAL DRAINAGE CATHETER;  Surgeon: [name], MD;  Location: [facility] OR;  Service: Thoracic;  Laterality: Left;   Social History:   reports that she has never smoked. She has never used smokeless tobacco. She reports that she does not drink alcohol or use illicit drugs.  Family History  Problem Relation Age of Onset  . [place]  cancer Mother   . [place] cancer Sister   . Cancer Sister     Breast  . [place] cancer Brother   . Heart disease Brother     Heart Attack  . Cancer Father     Leukemia  . Pneumonia Sister     Medications: Patient's Medications  New Prescriptions   No medications on file  Previous Medications   ALBUTEROL SULFATE HFA IN    2.'5mg'$ /87m solution. Give 1 vial via nebulizer every 4 hours as needed for shortness of breath.   ALPRAZOLAM (XANAX) 0.25 MG TABLET    Take 0.25 mg by mouth at bedtime.   AMBULATORY NON FORMULARY MEDICATION    Med Pass: Give 120 ml three times daily.   BISACODYL (DULCOLAX) 10 MG SUPPOSITORY    Place 10 mg rectally as needed for moderate constipation.   DOCUSATE SODIUM (COLACE) 100 MG CAPSULE    Take 1 capsule (100 mg total) by mouth 2 (two) times daily as needed for mild constipation.   LACTULOSE (CHRONULAC) 10 GM/15ML SOLUTION    Take 20 g by mouth daily as needed for mild constipation.   LOSARTAN (COZAAR) 50 MG TABLET    TAKE 1 TABLET (50 MG TOTAL) BY MOUTH DAILY.   MAGNESIUM HYDROXIDE (MILK OF MAGNESIA) 400 MG/5ML SUSPENSION    Take 30 mLs by mouth daily as needed for mild constipation.   METOCLOPRAMIDE (REGLAN) 5 MG TABLET    Take 5 mg by mouth daily before breakfast. As needed for nausea.   METOPROLOL SUCCINATE (TOPROL-XL) 100 MG 24 HR TABLET    TAKE ONE TABLET BY MOUTH ONCE DAILY.   MULTIPLE VITAMIN (MULTIVITAMIN WITH MINERALS) TABS TABLET    Take 1 tablet by mouth daily.   OMEPRAZOLE (PRILOSEC) 20 MG CAPSULE    Take 20 mg by mouth daily.   OXYCODONE-ACETAMINOPHEN (PERCOCET) 10-325 MG TABLET    Take 1 tablet by mouth every 6 (six) hours as needed for pain. Hold for sedation.   POLYETHYLENE GLYCOL (MIRALAX / GLYCOLAX) PACKET    Take 17 g by mouth 2 (two) times daily.   PROBIOTIC PRODUCT (PROBIOTIC DAILY PO)    Take 1 tablet by mouth daily.   ROPINIROLE (REQUIP) 0.25 MG TABLET    Take 0.25 mg by mouth every evening.   SENNA (SENOKOT) 8.6 MG TABS TABLET    Take 1  tablet by mouth at bedtime.   SERTRALINE (ZOLOFT) 100 MG TABLET    Take 1 tablet (100 mg total) by mouth at bedtime.   SODIUM PHOSPHATES (RA SALINE ENEMA RE)    Place 1 each rectally as needed (for constipation).  Modified Medications   No medications on file  Discontinued Medications   ACETAMINOPHEN (TYLENOL) 325 MG TABLET    Take 1-2 tablets (325-650 mg total) by mouth every 4 (four) hours as needed for mild pain.   AMOXICILLIN (AMOXIL) 500 MG CAPSULE    Take 1 capsule (500 mg total) by mouth every  12 (twelve) hours.   FUROSEMIDE (LASIX) 20 MG TABLET    Take 20 mg by mouth daily.   IPRATROPIUM-ALBUTEROL IN       KLOR-CON M20 20 MEQ TABLET    Take 40 mEq by mouth daily.   METRONIDAZOLE (METROCREAM) 0.75 % CREAM    Apply 1 application topically daily.   OXYCODONE-ACETAMINOPHEN (PERCOCET/ROXICET) 5-325 MG TABLET    Take one tablet by mouth every 4 hours as needed for 2 days     Physical Exam: Filed Vitals:   06/01/15 1008  BP: 114/68  Pulse: 56  Temp: 97 F (36.1 C)  TempSrc: Oral  Resp: 18  Height: '5\' 7"'$  (1.702 m)  Weight: 150 lb 9.6 oz (68.312 kg)    Physical Exam  Constitutional: She is oriented to person, place, and time. She appears well-developed.  HENT:  Head: Normocephalic and atraumatic.  Nose: Nose normal.  Mouth/Throat: Oropharynx is clear and moist. No oropharyngeal exudate.  Significant hearing loss  Eyes: Conjunctivae and EOM are normal. Pupils are equal, round, and reactive to light.  glasses  Neck: Neck supple. No JVD present.  Cardiovascular: Normal rate, regular rhythm and normal heart sounds.   Pulmonary/Chest: Effort normal and breath sounds normal. No respiratory distress.  Abdominal: Soft. Bowel sounds are normal.  Musculoskeletal: She exhibits no edema.  Neurological: She is alert and oriented to person, place, and time.  Skin: Skin is warm and dry.  Psychiatric: She has a normal mood and affect.    Labs reviewed: Basic Metabolic Panel:  Recent  Labs  04/28/15 0139 04/29/15 0350 04/30/15 0347 05/01/15 05/07/15  NA 138 134* 136 135* 138  K 5.3* 4.1 4.1 3.2* 3.4  CL 103 102 104  --   --   CO2 '27 24 23  '$ --   --   GLUCOSE 130* 125* 132*  --   --   BUN '17 14 13 20 '$ 23*  CREATININE 1.49* 0.75 0.68 0.5 0.6  CALCIUM 8.3* 8.2* 8.5*  --   --    Liver Function Tests:  Recent Labs  03/20/15 0422  04/13/15 0811 04/28/15 0139 05/01/15  AST 43*  < > 15 13* 14  ALT 35  < > 12* 10* 9  ALKPHOS 58  < > 70 59 64  BILITOT 0.6  --  0.6 0.3  --   PROT 5.3*  --  5.8* 5.1*  --   ALBUMIN 2.8*  --  2.9* 2.5*  --   < > = values in this interval not displayed.  Recent Labs  03/01/15 1024 03/09/15 1034 03/16/15 1120  LIPASE 37 37 38   No results for input(s): AMMONIA in the last 8760 hours. CBC:  Recent Labs  02/08/15 1114  03/16/15 1120  04/28/15 0139 04/29/15 0350 04/30/15 0347 05/01/15 05/07/15  WBC 8.9  < > 8.4  < > 14.7* 13.7* 13.3* 11.3 13.0  NEUTROABS 6.4  --  5.7  --  12.0*  --   --   --   --   HGB  --   < > 13.5  < > 11.3* 12.0 11.7* 11.9* 12.5  HCT 38.5  < > 41.0  < > 34.5* 37.8 36.1 36 39  MCV 91  < > 93.8  < > 91.8 91.7 90.3  --   --   PLT 297  < > 275  < > 224 245 259 332 359  < > = values in this interval not displayed. TSH:  Recent Labs  02/13/15 1515  TSH 1.290   A1C: Lab Results  Component Value Date   HGBA1C 6.2* 02/13/2015   Lipid Panel: No results for input(s): CHOL, HDL, LDLCALC, TRIG, CHOLHDL, LDLDIRECT in the last 8760 hours.   Assessment/Plan 1. Malignant neoplasm of left lung, unspecified part of lung ([place]) -followed by hospice services. No increase in shortness of breath or chest pains. Pain medication recently increased  2. Hereditary and idiopathic peripheral neuropathy Will start gabapentin 100 mg TID to help with symptoms   3. Chronic pain syndrome Stable, cont current regimen  4. Slow transit constipation Recently with increase in constipation. Senna-s increased to 2 tablets  qhs. Will cont to monitor  5. Vaginal dryness Ongoing complaints of vaginal dryness. Pt with hx and had ky jelly at beside to help. May keep at beside and use PRN.   Carlos American. Harle Battiest  [facility] [phone] 8 am - 5 pm) [phone] (after hours)

## 2015-06-22 ENCOUNTER — Non-Acute Institutional Stay (SKILLED_NURSING_FACILITY): Payer: Medicare Other | Admitting: Adult Health

## 2015-06-22 ENCOUNTER — Encounter: Payer: Self-pay | Admitting: Adult Health

## 2015-06-22 DIAGNOSIS — R110 Nausea: Secondary | ICD-10-CM

## 2015-06-22 NOTE — Progress Notes (Signed)
Patient ID: [name], female   DOB: Feb 06, 1916, 82 y.o.   MRN: [id number]   Facility: [facility]       Allergies  Allergen Reactions  . Aleve [Naproxen Sodium] Swelling    Legs and feet  . Statins Other (See Comments)    myopathy    Chief Complaint  Patient presents with  . Acute Visit    Nausea    HPI:  She is followed by hospice care.  Staff reports that she did have some vomiting yesterday. Today she has had nausea present. She is taking reglan prn; this will need to be on a routine basis. Staff reports that she fluctuates from constipation to diarrhea and has nausea as well. There are no reports of fever present.    Past Medical History  Diagnosis Date  . History of hiatal hernia   . Mitral valve prolapse   . Plantar fasciitis   . Hay fever   . GERD (gastroesophageal reflux disease)   . Palpitations   . Neuromuscular disorder ([place])   . Deafness     "very HOH; even w/hearing aides we have to communicate via writing things down for her to read" (08/25/2012)  . H/O hiatal hernia   . Arthritis     "a little all over" (08/25/2012)  . Lung mass     Hx: of  . Anxiety   . Hemorrhoids   . Neuropathy ([place])   . Hypertension   . Hyperglycemia     Past Surgical History  Procedure Laterality Date  . Foot surgery Bilateral ~ 2000    "pinched nerve on feet" (08/25/2012)  . Vesicovaginal fistula closure w/ tah    . Abdominal hysterectomy  1998  . Tonsillectomy and adenoidectomy  1940's  . Cesarean section  1952  . Knee arthroscopy w/ meniscectomy Left 07/15/2010    partial medial and lateral meniscectomies.Archie Endo 07/15/2010 (08/25/2012)  . Hip arthroplasty Right 08/26/2012    Procedure: ARTHROPLASTY BIPOLAR HIP;  Surgeon: Mauri Pole, MD;  Location: [place];  Service: Orthopedics;  Laterality: Right;  . Open reduction internal fixation (orif) distal radial fracture Right 09/11/2012    Procedure: OPEN REDUCTION INTERNAL FIXATION (ORIF) RIGHT DISTAL RADIAL  FRACTURE/ULNAR POSSIBLE BONE GRAFTING;  Surgeon: Linna Hoff, MD;  Location: [place];  Service: Orthopedics;  Laterality: Right;  . Cataract extraction, bilateral Bilateral   . Laparoscopic cholecystectomy  03/19/2015  . Cholecystectomy N/A 03/19/2015    Procedure: LAPAROSCOPIC CHOLECYSTECTOMY WITH INTRAOPERATIVE CHOLANGIOGRAM;  Surgeon: Donnie Mesa, MD;  Location: [place];  Service: General;  Laterality: N/A;  . Chest tube insertion Left 04/13/2015    Procedure: INSERTION PLEURAL DRAINAGE CATHETER;  Surgeon: [name], MD;  [facility];  Service: Thoracic;  Laterality: Left;  . Removal of pleural drainage catheter Left 05/08/2015    Procedure: REMOVAL OF PLEURAL DRAINAGE CATHETER;  Surgeon: [name], MD;  [facility];  Service: Thoracic;  Laterality: Left;    VITAL SIGNS BP 112/67 mmHg  Pulse 98  Temp(Src) 97.6 F (36.4 C) (Oral)  Resp 20  Ht '5\' 8"'$  (1.727 m)  Wt 150 lb 6 oz (68.21 kg)  BMI 22.87 kg/m2  Patient's Medications  New Prescriptions   No medications on file  Previous Medications   ACETAMINOPHEN (TYLENOL) 325 MG TABLET    Take 650 mg by mouth every 4 (four) hours as needed.   ALBUTEROL SULFATE HFA IN    2.'5mg'$ /68m solution. Give 1 vial via nebulizer every 4 hours  as needed for shortness of breath.   ALPRAZOLAM (XANAX) 0.25 MG TABLET    Take 0.25 mg by mouth every 8 (eight) hours as needed.    AMBULATORY NON FORMULARY MEDICATION    Med Pass: Give 120 ml three times daily.   BISACODYL (DULCOLAX) 10 MG SUPPOSITORY    Place 10 mg rectally as needed for moderate constipation.   DOCUSATE SODIUM (COLACE) 100 MG CAPSULE    Take 1 capsule (100 mg total) by mouth 2 (two) times daily as needed for mild constipation.   FUROSEMIDE (LASIX) 20 MG TABLET    Take 20 mg by mouth daily as needed.   LACTULOSE (CHRONULAC) 10 GM/15ML SOLUTION    Take 30 g by mouth daily as needed for mild constipation.    LOSARTAN (COZAAR) 50 MG TABLET    TAKE 1 TABLET (50 MG TOTAL) BY MOUTH  DAILY.   MAGNESIUM HYDROXIDE (MILK OF MAGNESIA) 400 MG/5ML SUSPENSION    Take 30 mLs by mouth daily as needed for mild constipation.   METOCLOPRAMIDE (REGLAN) 5 MG TABLET    Take 5 mg by mouth daily before breakfast. As needed for nausea.   METOPROLOL SUCCINATE (TOPROL-XL) 100 MG 24 HR TABLET    TAKE ONE TABLET BY MOUTH ONCE DAILY.   MULTIPLE VITAMIN (MULTIVITAMIN WITH MINERALS) TABS TABLET    Take 1 tablet by mouth daily.   OMEPRAZOLE (PRILOSEC) 20 MG CAPSULE    Take 20 mg by mouth daily.   OXYCODONE-ACETAMINOPHEN (PERCOCET/ROXICET) 5-325 MG TABLET    Take 1 tablet by mouth every 6 (six) hours as needed for severe pain.   POLYETHYLENE GLYCOL (MIRALAX / GLYCOLAX) PACKET    Take 17 g by mouth 2 (two) times daily.   PROBIOTIC PRODUCT (PROBIOTIC DAILY PO)    Take 1 tablet by mouth daily.   PROMETHAZINE (PHENERGAN) 25 MG/ML INJECTION    Inject 12.5 mg into the vein every 6 (six) hours as needed for nausea or vomiting.   ROPINIROLE (REQUIP) 0.25 MG TABLET    Take 0.25 mg by mouth every evening.   SENNA (SENOKOT) 8.6 MG TABS TABLET    Take 1 tablet by mouth at bedtime.   SERTRALINE (ZOLOFT) 100 MG TABLET    Take 1 tablet (100 mg total) by mouth at bedtime.   SODIUM PHOSPHATES (RA SALINE ENEMA RE)    Place 1 each rectally as needed (for constipation).   UNABLE TO FIND    Med Name: Mech soft NAS diet, thin liquids  Modified Medications   No medications on file  Discontinued Medications     SIGNIFICANT DIAGNOSTIC EXAMS  LABS REVIEWED:   05-07-15: wbc 13.0; hgb 12.5; hct 38.6;mcv 89.0; plt 359; glucose 120; bun 23; creat 0.6; k+ 3.4;na++138    Review of Systems  Constitutional: Negative for malaise/fatigue.  Respiratory: Negative for cough and shortness of breath.   Cardiovascular: Negative for chest pain, palpitations and leg swelling.  Gastrointestinal: Positive for nausea. Negative for heartburn, abdominal pain and constipation.  Musculoskeletal: Negative for myalgias and back pain.  Skin:  Negative.   Psychiatric/Behavioral: The patient is not nervous/anxious.     Physical Exam  Constitutional: She is oriented to person, place, and time. No distress.  Eyes: Conjunctivae are normal.  Neck: Neck supple. No JVD present. No thyromegaly present.  Cardiovascular: Normal rate, regular rhythm and intact distal pulses.   Respiratory: Effort normal and breath sounds normal. No respiratory distress. She has no wheezes.  GI: Soft. Bowel sounds are normal. She  exhibits no distension. There is no tenderness.  Rectal exam: soft stool present   Musculoskeletal: She exhibits no edema.  Able to move all extremities   Lymphadenopathy:    She has no cervical adenopathy.  Neurological: She is alert and oriented to person, place, and time.  Skin: Skin is warm and dry. She is not diaphoretic.  Psychiatric: She has a normal mood and affect.       ASSESSMENT/ PLAN:  1. Nausea without vomiting Will begin phenergan 12.5 mg IM every 6 hours as needed Will make her reglan 5 mg AC and HS on a routine basis     Ok Edwards NP [facility]  Contact [phone] Monday through Friday 8am- 5pm  After hours call [phone]

## 2015-06-26 ENCOUNTER — Encounter: Payer: Self-pay | Admitting: *Deleted

## 2015-06-28 ENCOUNTER — Encounter: Payer: Self-pay | Admitting: Adult Health

## 2015-06-28 ENCOUNTER — Non-Acute Institutional Stay (SKILLED_NURSING_FACILITY): Payer: Medicare Other | Admitting: Adult Health

## 2015-06-28 DIAGNOSIS — F418 Other specified anxiety disorders: Secondary | ICD-10-CM | POA: Diagnosis not present

## 2015-06-28 NOTE — Progress Notes (Signed)
Patient ID: [name], female   DOB: 03-24-1916, 82 y.o.   MRN: [id number]   Facility[facility]       Allergies  Allergen Reactions  . Aleve [Naproxen Sodium] Swelling    Legs and feet  . Statins Other (See Comments)    myopathy    Chief Complaint  Patient presents with  . Acute Visit    Depression    HPI:  She is followed by hospice care. Staff reports that she is more tearful. She states that she feels hopeless and feels as though her mood state is worse. There are no reports of changes in her appetite or sleep patterns.    Past Medical History  Diagnosis Date  . History of hiatal hernia   . Mitral valve prolapse   . Plantar fasciitis   . Hay fever   . GERD (gastroesophageal reflux disease)   . Palpitations   . Neuromuscular disorder ([place])   . Deafness     "very HOH; even w/hearing aides we have to communicate via writing things down for her to read" (08/25/2012)  . H/O hiatal hernia   . Arthritis     "a little all over" (08/25/2012)  . Lung mass     Hx: of  . Anxiety   . Hemorrhoids   . Neuropathy ([place])   . Hypertension   . Hyperglycemia     Past Surgical History  Procedure Laterality Date  . Foot surgery Bilateral ~ 2000    "pinched nerve on feet" (08/25/2012)  . Vesicovaginal fistula closure w/ tah    . Abdominal hysterectomy  1998  . Tonsillectomy and adenoidectomy  1940's  . Cesarean section  1952  . Knee arthroscopy w/ meniscectomy Left 07/15/2010    partial medial and lateral meniscectomies.Archie Endo 07/15/2010 (08/25/2012)  . Hip arthroplasty Right 08/26/2012    Procedure: ARTHROPLASTY BIPOLAR HIP;  Surgeon: Mauri Pole, MD;  Location: [place];  Service: Orthopedics;  Laterality: Right;  . Open reduction internal fixation (orif) distal radial fracture Right 09/11/2012    Procedure: OPEN REDUCTION INTERNAL FIXATION (ORIF) RIGHT DISTAL RADIAL FRACTURE/ULNAR POSSIBLE BONE GRAFTING;  Surgeon: Linna Hoff, MD;  Location: [place];  Service:  Orthopedics;  Laterality: Right;  . Cataract extraction, bilateral Bilateral   . Laparoscopic cholecystectomy  03/19/2015  . Cholecystectomy N/A 03/19/2015    Procedure: LAPAROSCOPIC CHOLECYSTECTOMY WITH INTRAOPERATIVE CHOLANGIOGRAM;  Surgeon: Donnie Mesa, MD;  Location: [place];  Service: General;  Laterality: N/A;  . Chest tube insertion Left 04/13/2015    Procedure: INSERTION PLEURAL DRAINAGE CATHETER;  Surgeon: [name], MD;  [facility];  Service: Thoracic;  Laterality: Left;  . Removal of pleural drainage catheter Left 05/08/2015    Procedure: REMOVAL OF PLEURAL DRAINAGE CATHETER;  Surgeon: [name], MD;  [facility];  Service: Thoracic;  Laterality: Left;    VITAL SIGNS BP 154/93 mmHg  Pulse 84  Temp(Src) 97.6 F (36.4 C) (Oral)  Resp 18  Ht '5\' 8"'$  (1.727 m)  Wt 150 lb 6 oz (68.21 kg)  BMI 22.87 kg/m2  Patient's Medications  New Prescriptions   No medications on file  Previous Medications   ACETAMINOPHEN (TYLENOL) 325 MG TABLET    Take 650 mg by mouth every 4 (four) hours as needed.   ALBUTEROL SULFATE HFA IN    2.'5mg'$ /75m solution. Give 1 vial via nebulizer every 4 hours as needed for shortness of breath.   ALPRAZOLAM (XANAX) 0.25 MG TABLET    Take  0.25 mg by mouth every 8 (eight) hours as needed.    AMBULATORY NON FORMULARY MEDICATION    Med Pass: Give 120 ml three times daily.   BISACODYL (DULCOLAX) 10 MG SUPPOSITORY    Place 10 mg rectally as needed for moderate constipation.   DOCUSATE SODIUM (COLACE) 100 MG CAPSULE    Take 1 capsule (100 mg total) by mouth 2 (two) times daily as needed for mild constipation.   FUROSEMIDE (LASIX) 20 MG TABLET    Take 20 mg by mouth daily as needed.   LACTULOSE (CHRONULAC) 10 GM/15ML SOLUTION    Take 30 g by mouth daily as needed for mild constipation.    LOSARTAN (COZAAR) 50 MG TABLET    TAKE 1 TABLET (50 MG TOTAL) BY MOUTH DAILY.   MAGNESIUM HYDROXIDE (MILK OF MAGNESIA) 400 MG/5ML SUSPENSION    Take 30 mLs by mouth daily  as needed for mild constipation.   METOCLOPRAMIDE (REGLAN) 5 MG TABLET    Take 5 mg by mouth daily before breakfast. As needed for nausea.   METOPROLOL SUCCINATE (TOPROL-XL) 100 MG 24 HR TABLET    TAKE ONE TABLET BY MOUTH ONCE DAILY.   MULTIPLE VITAMIN (MULTIVITAMIN WITH MINERALS) TABS TABLET    Take 1 tablet by mouth daily. Reported on 06/28/2015   OMEPRAZOLE (PRILOSEC) 20 MG CAPSULE    Take 20 mg by mouth daily.   OXYCODONE-ACETAMINOPHEN (PERCOCET/ROXICET) 5-325 MG TABLET    Take 1 tablet by mouth every 6 (six) hours as needed for severe pain.   POLYETHYLENE GLYCOL (MIRALAX / GLYCOLAX) PACKET    Take 17 g by mouth 2 (two) times daily.   PROBIOTIC PRODUCT (PROBIOTIC DAILY PO)    Take 1 tablet by mouth daily.   PROMETHAZINE (PHENERGAN) 25 MG/ML INJECTION    Inject 12.5 mg into the vein every 6 (six) hours as needed for nausea or vomiting.   ROPINIROLE (REQUIP) 0.25 MG TABLET    Take 0.25 mg by mouth every evening.   SENNA (SENOKOT) 8.6 MG TABS TABLET    Take 1 tablet by mouth at bedtime.   SERTRALINE (ZOLOFT) 100 MG TABLET    Take 1 tablet (100 mg total) by mouth at bedtime.   SODIUM PHOSPHATES (RA SALINE ENEMA RE)    Place 1 each rectally as needed (for constipation).   UNABLE TO FIND    120 mLs 3 (three) times daily. Med Name: Med pass  Mech soft NAS diet, thin liquids  Modified Medications   No medications on file  Discontinued Medications   No medications on file     SIGNIFICANT DIAGNOSTIC EXAMS   LABS REVIEWED:   05-07-15: wbc 13.0; hgb 12.5; hct 38.6;mcv 89.0; plt 359; glucose 120; bun 23; creat 0.6; k+ 3.4;na++138    Review of Systems  Constitutional: Negative for malaise/fatigue.  Respiratory: Negative for cough and shortness of breath.   Cardiovascular: Negative for chest pain, palpitations and leg swelling.  Gastrointestinal:. Negative for heartburn, abdominal pain and constipation.  Musculoskeletal: Negative for myalgias and back pain.  Skin: Negative.     Psychiatric/Behavioral: has depression and crying     Physical Exam  Constitutional: She is oriented to person, place, and time. No distress.  Eyes: Conjunctivae are normal.  Neck: Neck supple. No JVD present. No thyromegaly present.  Cardiovascular: Normal rate, regular rhythm and intact distal pulses.   Respiratory: Effort normal and breath sounds normal. No respiratory distress. She has no wheezes.  GI: Soft. Bowel sounds are normal. She exhibits  no distension. There is no tenderness.  Musculoskeletal: She exhibits no edema.  Able to move all extremities   Lymphadenopathy:    She has no cervical adenopathy.  Neurological: She is alert and oriented to person, place, and time.  Skin: Skin is warm and dry. She is not diaphoretic.  Psychiatric: has depressed affect       ASSESSMENT/ PLAN:  1.depression with anxiety will increase her zoloft to 150 mg daily and will monitor her status.     Ok Edwards NP [facility]  Contact [phone] Monday through Friday 8am- 5pm  After hours call [phone]

## 2015-07-11 ENCOUNTER — Non-Acute Institutional Stay (SKILLED_NURSING_FACILITY): Payer: Medicare Other | Admitting: Adult Health

## 2015-07-11 ENCOUNTER — Encounter: Payer: Self-pay | Admitting: Adult Health

## 2015-07-11 DIAGNOSIS — R410 Disorientation, unspecified: Secondary | ICD-10-CM

## 2015-07-11 DIAGNOSIS — Z7189 Other specified counseling: Secondary | ICD-10-CM | POA: Diagnosis not present

## 2015-07-11 DIAGNOSIS — R300 Dysuria: Secondary | ICD-10-CM

## 2015-07-11 NOTE — Progress Notes (Signed)
Patient ID: [name], female   DOB: February 20, 1916, 82 y.o.   MRN: [id number]  Location:  [place] Room Number: 277 AJOIN of Service:  SNF (31) Provider:   Cindi Carbon, ANP [place] [phone]   REED, Jonelle Sidle, DO  Patient Care Team: Gayland Curry, DO as PCP - General (Geriatric Medicine) Anastasio Auerbach, MD as Consulting Physician (Gynecology)  Extended Emergency Contact Information Primary Emergency Contact: Julieta Gutting [place] of [place] Phone: [phone] Mobile Phone: [phone] Relation: Daughter Secondary Emergency Contact: Kachmar,Clyde Address: [place], [place] [address] Johnnette Litter of [place] Phone: [phone] Mobile Phone: [phone] Relation: Son  Code Status:  DNR, most form with goals of care comfort based Goals of care: Advanced Directive information Advanced Directives 07/11/2015  Does patient have an advance directive? Yes  Type of Advance Directive Out of facility DNR (pink MOST or yellow form);Living will;Healthcare Power of Attorney  Does patient want to make changes to advanced directive? No - Patient declined  Copy of advanced directive(s) in chart? Yes     Chief Complaint  Patient presents with  . Acute Visit    Confusion and dysuria    HPI:  Pt is a 82 y.o. female seen today for an acute visit for dysuria and delirium.  She is afebrile with a temp of 97.4, and other VS are within normal limits. She has a hx of lung adenocarcinoma. She has decided to forego treatment due to her age and debility. She is followed by hospice.  She has had dysuria since yesterday and is now having hallucinations. A urine was collected but the results are pending.  She does not have any abd or back pain.     Past Medical History  Diagnosis Date  . History of hiatal hernia   . Mitral valve prolapse   . Plantar fasciitis   . Hay fever   . GERD  (gastroesophageal reflux disease)   . Palpitations   . Neuromuscular disorder ([place])   . Deafness     "very HOH; even w/hearing aides we have to communicate via writing things down for her to read" (08/25/2012)  . H/O hiatal hernia   . Arthritis     "a little all over" (08/25/2012)  . Lung mass     Hx: of  . Anxiety   . Hemorrhoids   . Neuropathy ([place])   . Hypertension   . Hyperglycemia    Past Surgical History  Procedure Laterality Date  . Foot surgery Bilateral ~ 2000    "pinched nerve on feet" (08/25/2012)  . Vesicovaginal fistula closure w/ tah    . Abdominal hysterectomy  1998  . Tonsillectomy and adenoidectomy  1940's  . Cesarean section  1952  . Knee arthroscopy w/ meniscectomy Left 07/15/2010    partial medial and lateral meniscectomies.Archie Endo 07/15/2010 (08/25/2012)  . Hip arthroplasty Right 08/26/2012    Procedure: ARTHROPLASTY BIPOLAR HIP;  Surgeon: Mauri Pole, MD;  Location: [place];  Service: Orthopedics;  Laterality: Right;  . Open reduction internal fixation (orif) distal radial fracture Right 09/11/2012    Procedure: OPEN REDUCTION INTERNAL FIXATION (ORIF) RIGHT DISTAL RADIAL FRACTURE/ULNAR POSSIBLE BONE GRAFTING;  Surgeon: Linna Hoff, MD;  Location: [place];  Service: Orthopedics;  Laterality: Right;  . Cataract extraction, bilateral Bilateral   . Laparoscopic cholecystectomy  03/19/2015  . Cholecystectomy N/A 03/19/2015    Procedure: LAPAROSCOPIC  CHOLECYSTECTOMY WITH INTRAOPERATIVE CHOLANGIOGRAM;  Surgeon: Donnie Mesa, MD;  Location: [place];  Service: General;  Laterality: N/A;  . Chest tube insertion Left 04/13/2015    Procedure: INSERTION PLEURAL DRAINAGE CATHETER;  Surgeon: [name], MD;  Location: [place];  Service: Thoracic;  Laterality: Left;  . Removal of pleural drainage catheter Left 05/08/2015    Procedure: REMOVAL OF PLEURAL DRAINAGE CATHETER;  Surgeon: [name], MD;  Location: [place];  Service: Thoracic;  Laterality: Left;    Allergies    Allergen Reactions  . Aleve [Naproxen Sodium] Swelling    Legs and feet  . Statins Other (See Comments)    myopathy      Medication List       This list is accurate as of: 07/11/15 11:02 AM.  Always use your most recent med list.               acetaminophen 325 MG tablet  Commonly known as:  TYLENOL  Take 650 mg by mouth every 4 (four) hours as needed.     ACID GONE PO  Take 1 tablet by mouth every 4 hours as needed for gas or bloating.     ALBUTEROL SULFATE HFA IN  2.'5mg'$ /31m solution. Give 1 vial via nebulizer every 4 hours as needed for shortness of breath.     ALPRAZolam 0.25 MG tablet  Commonly known as:  XANAX  Take 0.25 mg by mouth 2 (two) times daily.     AMBULATORY NON FORMULARY MEDICATION  Med Pass: Give 120 ml three times daily.     bisacodyl 10 MG suppository  Commonly known as:  DULCOLAX  Place 10 mg rectally as needed for moderate constipation.     furosemide 20 MG tablet  Commonly known as:  LASIX  Take 20 mg by mouth daily as needed.     gabapentin 100 MG capsule  Commonly known as:  NEURONTIN  Take 100 mg by mouth 3 (three) times daily.     losartan 25 MG tablet  Commonly known as:  COZAAR  Take 25 mg by mouth daily. Hold for systolic < 1704     magnesium hydroxide 400 MG/5ML suspension  Commonly known as:  MILK OF MAGNESIA  Take 30 mLs by mouth daily as needed for mild constipation.     metoCLOPramide 5 MG tablet  Commonly known as:  REGLAN  Take 5 mg by mouth daily before breakfast. As needed for nausea.     metoprolol succinate 100 MG 24 hr tablet  Commonly known as:  TOPROL-XL  TAKE ONE TABLET BY MOUTH ONCE DAILY.     morphine 20 MG/ML concentrated solution  Commonly known as:  ROXANOL  Place 10 mg under the tongue every 2 (two) hours as needed for severe pain or shortness of breath.     omeprazole 20 MG capsule  Commonly known as:  PRILOSEC  Take 20 mg by mouth daily.     PROBIOTIC DAILY PO  Take 1 tablet by mouth daily.      promethazine 12.5 MG tablet  Commonly known as:  PHENERGAN  Take 12.5 mg by mouth every 8 (eight) hours as needed for nausea or vomiting.     promethazine 25 MG/ML injection  Commonly known as:  PHENERGAN  Inject 12.5 mg into the vein every 6 (six) hours as needed for nausea or vomiting.     RA SALINE ENEMA RE  Place 1 each rectally as needed (for constipation).  rOPINIRole 0.25 MG tablet  Commonly known as:  REQUIP  Take 0.25 mg by mouth every evening.     senna 8.6 MG Tabs tablet  Commonly known as:  SENOKOT  Take 1 tablet by mouth at bedtime.     sertraline 100 MG tablet  Commonly known as:  ZOLOFT  Give 1 and 1/2 tablet (150 mg total) by mouth every night at bedtime.        Review of Systems  Immunization History  Administered Date(s) Administered  . Influenza Split 03/04/2011  . PPD Test 05/02/2015  . Pneumococcal Conjugate-13 04/28/2014  . Pneumococcal Polysaccharide-23 07/08/2006  . Tdap 11/08/2013  . Zoster 03/03/2011   Pertinent  Health Maintenance Due  Topic Date Due  . DEXA SCAN  03/31/2018 (Originally 09/26/201984)  . INFLUENZA VACCINE  10/30/2015  . PNA vac Low Risk Adult  Completed   Fall Risk  03/12/2015 02/08/2015 12/29/2014 10/27/2014 07/28/2014  Falls in the past year? No No No No Yes  Number falls in past yr: - - - - 2 or more  Injury with Fall? - - - - No   Functional Status Survey:    Filed Vitals:   07/11/15 1036  BP: 110/64  Pulse: 104  Temp: 97.5 F (36.4 C)  TempSrc: Oral  Resp: 24  Height: '5\' 8"'$  (1.727 m)  Weight: 152 lb (68.947 kg)   Body mass index is 23.12 kg/(m^2). Physical Exam  Labs reviewed:  Recent Labs  04/28/15 0139 04/29/15 0350 04/30/15 0347 05/01/15 05/07/15  NA 138 134* 136 135* 138  K 5.3* 4.1 4.1 3.2* 3.4  CL 103 102 104  --   --   CO2 '27 24 23  '$ --   --   GLUCOSE 130* 125* 132*  --   --   BUN '17 14 13 20 '$ 23*  CREATININE 1.49* 0.75 0.68 0.5 0.6  CALCIUM 8.3* 8.2* 8.5*  --   --     Recent Labs   03/20/15 0422  04/13/15 0811 04/28/15 0139 05/01/15  AST 43*  < > 15 13* 14  ALT 35  < > 12* 10* 9  ALKPHOS 58  < > 70 59 64  BILITOT 0.6  --  0.6 0.3  --   PROT 5.3*  --  5.8* 5.1*  --   ALBUMIN 2.8*  --  2.9* 2.5*  --   < > = values in this interval not displayed.  Recent Labs  02/08/15 1114  03/16/15 1120  04/28/15 0139 04/29/15 0350 04/30/15 0347 05/01/15 05/07/15  WBC 8.9  < > 8.4  < > 14.7* 13.7* 13.3* 11.3 13.0  NEUTROABS 6.4  --  5.7  --  12.0*  --   --   --   --   HGB  --   < > 13.5  < > 11.3* 12.0 11.7* 11.9* 12.5  HCT 38.5  < > 41.0  < > 34.5* 37.8 36.1 36 39  MCV 91  < > 93.8  < > 91.8 91.7 90.3  --   --   PLT 297  < > 275  < > 224 245 259 332 359  < > = values in this interval not displayed. Lab Results  Component Value Date   TSH 1.290 02/13/2015   Lab Results  Component Value Date   HGBA1C 6.2* 02/13/2015   Lab Results  Component Value Date   CHOL 309* 12/23/2013   HDL 75 12/23/2013   LDLCALC 222* 12/23/2013   LDLDIRECT  199.0 05/06/2012   TRIG 62 12/23/2013   CHOLHDL 4.1 12/23/2013    Significant Diagnostic Results in last 30 days:  No results found.  Assessment/Plan  1. Dysuria UA C and S pending Due to significant dysuria will start Bactrim DS  1 tab BID for 7days  2. Acute delirium -afebrile, most likely due to infection -may use xanax 0.25 mg q4 hrs as needed   3. Advanced care planning -I spoke with her daughter regarding her overall poor condition and quality of life.  She has a most form indicating no hospitalizations.  They have declined treatment for her lung ca dx.  The resident does require oxygen now at 2L and had an episode of hypoxia per the hospice nurse. She has adventitious lung sounds and of course there is concern for pneumonia. However, the resident has voiced that she is ready to die and her goals of care are comfort based.  We will treat the UTI for reasons of comfort but would not pursue any further work up or IVF  (resident appears dehydrated)   Family/ staff Communication:discussed with nurse, resident POA  Labs/tests ordered:  NA Cindi Carbon, [place] [phone]

## 2015-07-12 ENCOUNTER — Non-Acute Institutional Stay (SKILLED_NURSING_FACILITY): Payer: Medicare Other | Admitting: Adult Health

## 2015-07-12 ENCOUNTER — Encounter: Payer: Self-pay | Admitting: Adult Health

## 2015-07-12 DIAGNOSIS — C3492 Malignant neoplasm of unspecified part of left bronchus or lung: Secondary | ICD-10-CM | POA: Diagnosis not present

## 2015-07-12 DIAGNOSIS — N390 Urinary tract infection, site not specified: Secondary | ICD-10-CM

## 2015-07-12 DIAGNOSIS — G609 Hereditary and idiopathic neuropathy, unspecified: Secondary | ICD-10-CM | POA: Diagnosis not present

## 2015-07-12 DIAGNOSIS — F418 Other specified anxiety disorders: Secondary | ICD-10-CM | POA: Diagnosis not present

## 2015-07-12 DIAGNOSIS — K219 Gastro-esophageal reflux disease without esophagitis: Secondary | ICD-10-CM

## 2015-07-12 DIAGNOSIS — I5032 Chronic diastolic (congestive) heart failure: Secondary | ICD-10-CM | POA: Diagnosis not present

## 2015-07-12 DIAGNOSIS — I10 Essential (primary) hypertension: Secondary | ICD-10-CM | POA: Diagnosis not present

## 2015-07-12 NOTE — Progress Notes (Signed)
Patient ID: [name], female   DOB: October 13, 1915, 82 y.o.   MRN: [id number]   Facility: [facility]       Allergies  Allergen Reactions  . Aleve [Naproxen Sodium] Swelling    Legs and feet  . Statins Other (See Comments)    myopathy    Chief Complaint  Patient presents with  . Acute Visit    HPI:  She is a long term resident of this facility being seen for the management of her chronic illnesses. She is followed by hospice care. She is not voicing any complaints or concerns. There are no nursing concerns at this time.    Past Medical History  Diagnosis Date  . History of hiatal hernia   . Mitral valve prolapse   . Plantar fasciitis   . Hay fever   . GERD (gastroesophageal reflux disease)   . Palpitations   . Neuromuscular disorder ([place])   . Deafness     "very HOH; even w/hearing aides we have to communicate via writing things down for her to read" (08/25/2012)  . H/O hiatal hernia   . Arthritis     "a little all over" (08/25/2012)  . Lung mass     Hx: of  . Anxiety   . Hemorrhoids   . Neuropathy ([place])   . Hypertension   . Hyperglycemia     Past Surgical History  Procedure Laterality Date  . Foot surgery Bilateral ~ 2000    "pinched nerve on feet" (08/25/2012)  . Vesicovaginal fistula closure w/ tah    . Abdominal hysterectomy  1998  . Tonsillectomy and adenoidectomy  1940's  . Cesarean section  1952  . Knee arthroscopy w/ meniscectomy Left 07/15/2010    partial medial and lateral meniscectomies.Archie Endo 07/15/2010 (08/25/2012)  . Hip arthroplasty Right 08/26/2012    Procedure: ARTHROPLASTY BIPOLAR HIP;  Surgeon: Mauri Pole, MD;  Location: [facility];  Service: Orthopedics;  Laterality: Right;  . Open reduction internal fixation (orif) distal radial fracture Right 09/11/2012    Procedure: OPEN REDUCTION INTERNAL FIXATION (ORIF) RIGHT DISTAL RADIAL FRACTURE/ULNAR POSSIBLE BONE GRAFTING;  Surgeon: Linna Hoff, MD;  Location: [facility];  Service: Orthopedics;   Laterality: Right;  . Cataract extraction, bilateral Bilateral   . Laparoscopic cholecystectomy  03/19/2015  . Cholecystectomy N/A 03/19/2015    Procedure: LAPAROSCOPIC CHOLECYSTECTOMY WITH INTRAOPERATIVE CHOLANGIOGRAM;  Surgeon: Donnie Mesa, MD;  Location: [facility];  Service: General;  Laterality: N/A;  . Chest tube insertion Left 04/13/2015    Procedure: INSERTION PLEURAL DRAINAGE CATHETER;  Surgeon: [name], MD;  Location: [facility];  Service: Thoracic;  Laterality: Left;  . Removal of pleural drainage catheter Left 05/08/2015    Procedure: REMOVAL OF PLEURAL DRAINAGE CATHETER;  Surgeon: [name], MD;  Location: [facility];  Service: Thoracic;  Laterality: Left;    VITAL SIGNS BP 111/57 mmHg  Pulse 86  Temp(Src) 96.7 F (35.9 C) (Oral)  Resp 20  Ht '5\' 8"'$  (1.727 m)  Wt 152 lb (68.947 kg)  BMI 23.12 kg/m2  Patient's Medications  New Prescriptions   No medications on file  Previous Medications   ACETAMINOPHEN (TYLENOL) 325 MG TABLET    Take 650 mg by mouth every 4 (four) hours as needed.   ALBUTEROL SULFATE HFA IN    2.'5mg'$ /41m solution. Give 1 vial via nebulizer every 4 hours as needed for shortness of breath.   ALPRAZOLAM (XANAX) 0.25 MG TABLET    Take 0.25 mg by mouth 2 (two)  times daily.    ALUM HYDROXIDE-MAG CARBONATE (ACID GONE PO)    Take 1 tablet by mouth every 4 hours as needed for gas or bloating.   AMBULATORY NON FORMULARY MEDICATION    Med Pass: Give 120 ml three times daily.   BISACODYL (DULCOLAX) 10 MG SUPPOSITORY    Place 10 mg rectally as needed for moderate constipation.   FUROSEMIDE (LASIX) 20 MG TABLET    Take 20 mg by mouth daily as needed.   GABAPENTIN (NEURONTIN) 100 MG CAPSULE    Take 100 mg by mouth 3 (three) times daily.   LOSARTAN (COZAAR) 25 MG TABLET    Take 25 mg by mouth daily. Hold for systolic < 630.   MAGNESIUM HYDROXIDE (MILK OF MAGNESIA) 400 MG/5ML SUSPENSION    Take 30 mLs by mouth daily as needed for mild constipation.   METOCLOPRAMIDE (REGLAN) 5  MG TABLET    Take 5 mg by mouth daily before breakfast. As needed for nausea.   METOPROLOL SUCCINATE (TOPROL-XL) 100 MG 24 HR TABLET    TAKE ONE TABLET BY MOUTH ONCE DAILY.   MORPHINE (ROXANOL) 20 MG/ML CONCENTRATED SOLUTION    Place 10 mg under the tongue every 2 (two) hours as needed for severe pain or shortness of breath.   OMEPRAZOLE (PRILOSEC) 20 MG CAPSULE    Take 20 mg by mouth daily.   PROBIOTIC PRODUCT (PROBIOTIC DAILY PO)    Take 1 tablet by mouth daily.   PROMETHAZINE (PHENERGAN) 12.5 MG TABLET    Take 12.5 mg by mouth every 8 (eight) hours as needed for nausea or vomiting.   PROMETHAZINE (PHENERGAN) 25 MG/ML INJECTION    Inject 12.5 mg into the vein every 6 (six) hours as needed for nausea or vomiting.   ROPINIROLE (REQUIP) 0.25 MG TABLET    Take 0.25 mg by mouth every evening.   SENNA (SENOKOT) 8.6 MG TABS TABLET    Take 1 tablet by mouth at bedtime.   SERTRALINE (ZOLOFT) 100 MG TABLET    Give 1 and 1/2 tablet (150 mg total) by mouth every night at bedtime.   SODIUM PHOSPHATES (RA SALINE ENEMA RE)    Place 1 each rectally as needed (for constipation).   SULFAMETHOXAZOLE-TRIMETHOPRIM (BACTRIM DS,SEPTRA DS) 800-160 MG TABLET    Take 1 tablet by mouth 2 (two) times daily.  Modified Medications   No medications on file  Discontinued Medications   No medications on file     SIGNIFICANT DIAGNOSTIC EXAMS  LABS REVIEWED:   05-07-15: wbc 13.0; hgb 12.5; hct 38.6;mcv 89.0; plt 359; glucose 120; bun 23; creat 0.6; k+ 3.4;na++138  06-03-15: urine culture: no growth    Review of Systems  Constitutional: Negative for malaise/fatigue.  Respiratory: Negative for cough and shortness of breath.   Cardiovascular: Negative for chest pain, palpitations and leg swelling.  Gastrointestinal:  Negative for nausea heartburn, abdominal pain and constipation.  Musculoskeletal: Negative for myalgias and back pain.  Skin: Negative.   Psychiatric/Behavioral: The patient is not nervous/anxious.      Physical Exam  Constitutional: She is oriented to person, place, and time. No distress.  Eyes: Conjunctivae are normal.  Neck: Neck supple. No JVD present. No thyromegaly present.  Cardiovascular: Normal rate, regular rhythm and intact distal pulses.   Respiratory: Effort normal and breath sounds normal. No respiratory distress. She has no wheezes.  GI: Soft. Bowel sounds are normal. She exhibits no distension. There is no tenderness.   Musculoskeletal: She exhibits no edema.  Able  to move all extremities   Lymphadenopathy:    She has no cervical adenopathy.  Neurological: She is alert and oriented to person, place, and time.  Skin: Skin is warm and dry. She is not diaphoretic.  Psychiatric: She has a normal mood and affect.       ASSESSMENT/ PLAN:  1. Jerrye Bushy: will continue prilosec 20 mg daily and reglan 5 mg every ac and hs  2. Peripheral neuropathy: will continue neurontin 100 mg three times daily is taking requip 0.25 mg daily for RLS sympotoms  3. Hypertension: will continue cozaar 25 mg daily; toprol xl 100 mg twice daily   4. Chronic diastolic heart failure: has lasix 20 mg daily as needed  5. Constipation: will continue senna daily  6. Depression and anxiety: will continue xanax 0.25 mg twice daily and zoloft 150 mg daily   7. Lung cancer: is followed by hospice care; will monitor has roxanol 10 mg every 2 hours as needed  8. UTI: will complete septra ds and will monitor         Ok Edwards NP [facility]  Contact [phone] Monday through Friday 8am- 5pm  After hours call [phone]

## 2015-07-18 DIAGNOSIS — F418 Other specified anxiety disorders: Secondary | ICD-10-CM | POA: Insufficient documentation

## 2015-07-30 DEATH — deceased

## 2017-01-05 IMAGING — CT CT ABD-PELV W/ CM
2 of 5 series · 10 of 46 positions shown, 11 images · IV contrast (Iodine)
Comparison: PET-CT 01/06/2013 comet CT of the chest on 01/18/2014

CLINICAL DATA: Abdominal pain. ED Notes: Pt here with abd pain for
several weeks, RUQ pain when she turns on her right side. Denies
pain at current. Has no appetite.

EXAM:
CT ABDOMEN AND PELVIS WITH CONTRAST
TECHNIQUE: Multidetector CT imaging of the abdomen and pelvis was performed
using the standard protocol following bolus administration of
intravenous contrast.
CONTRAST:  100mL OMNIPAQUE IOHEXOL 300 MG/ML  SOLN

[Series 201: routine, idose (2) · axial · 0.83mm/px · z∈[+22,+377]mm · 7 of 93 slices shown, 8 images]
[im 11/93  soft-tissue]
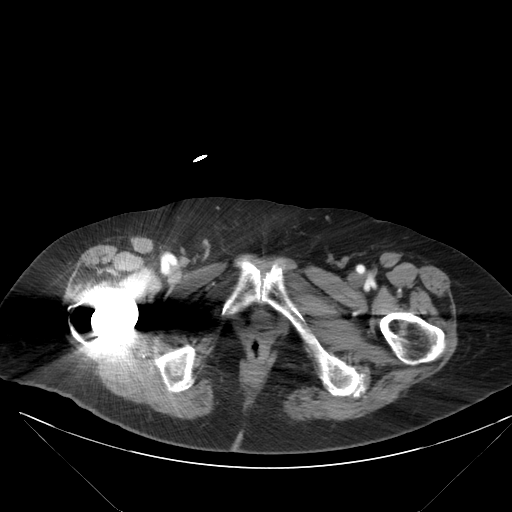
[im 11/93  bone]
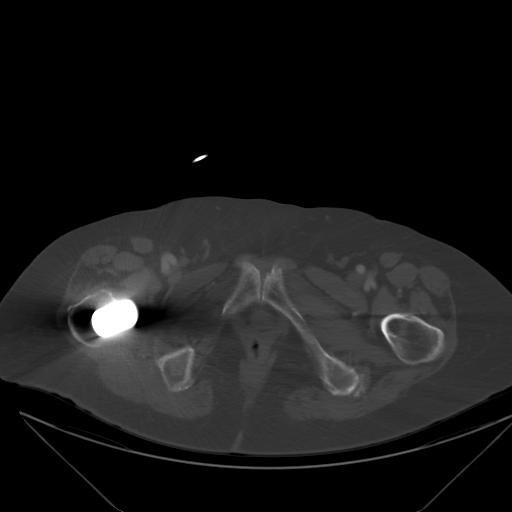
[im 21/93  soft-tissue]
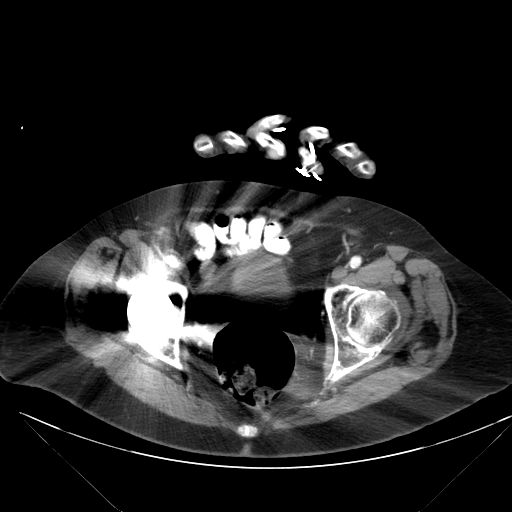
[im 36/93  soft-tissue]
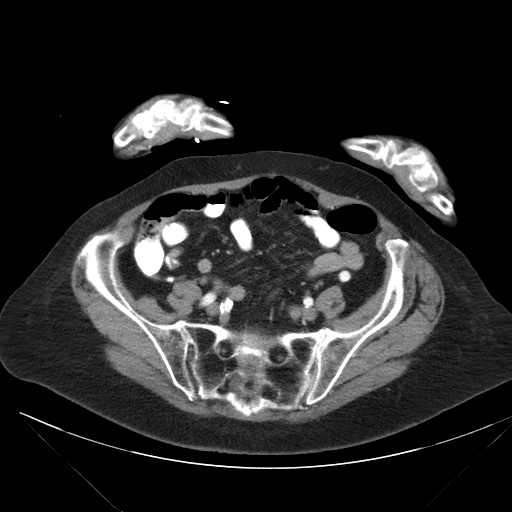
[im 47/93  soft-tissue]
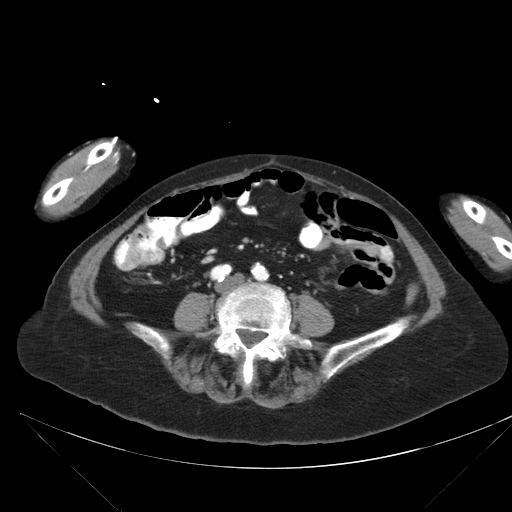
[im 57/93  soft-tissue]
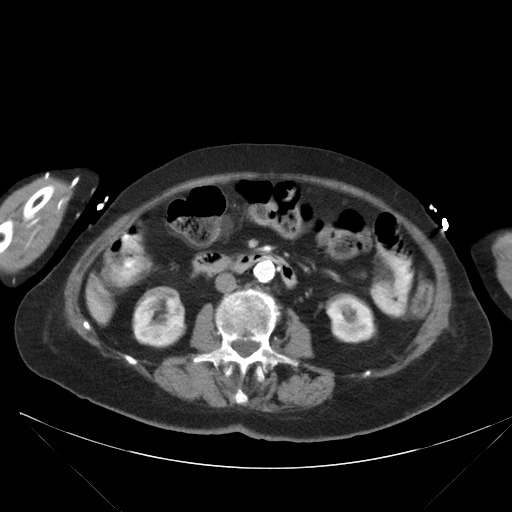
[im 72/93  soft-tissue]
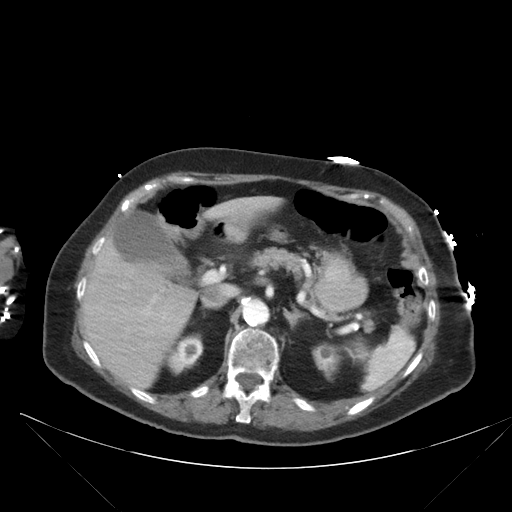
[im 82/93  soft-tissue]
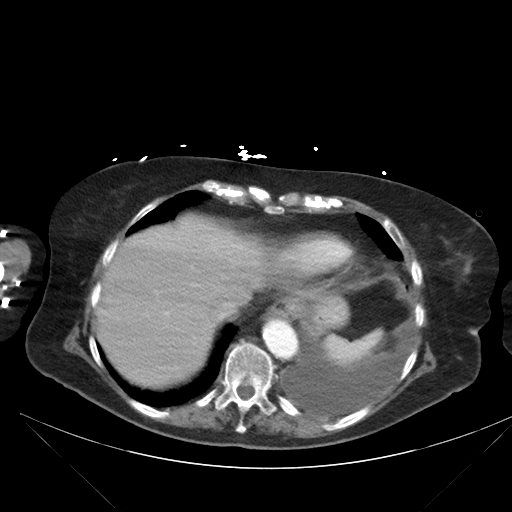

[Series 203: coronals, idose (2) · coronal · 0.45mm/px · 3 of 142 slices shown]
[im 48/142  soft-tissue]
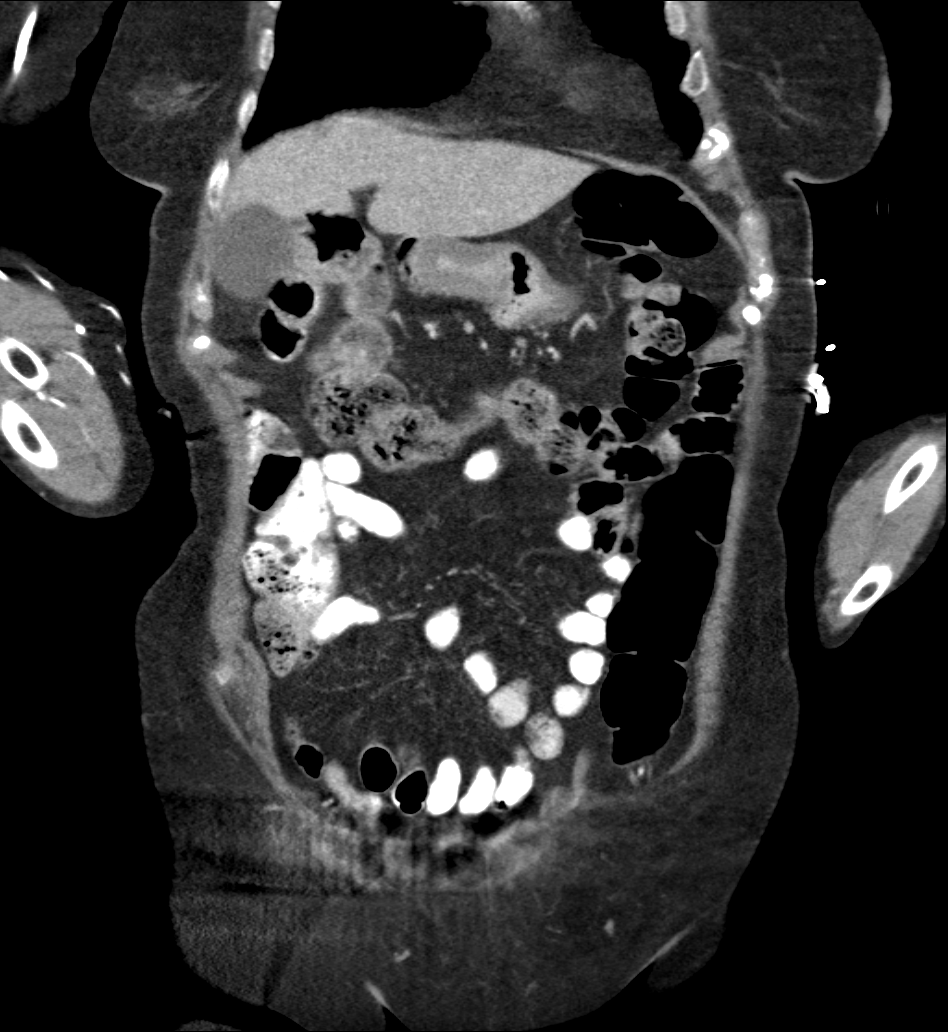
[im 63/142  soft-tissue]
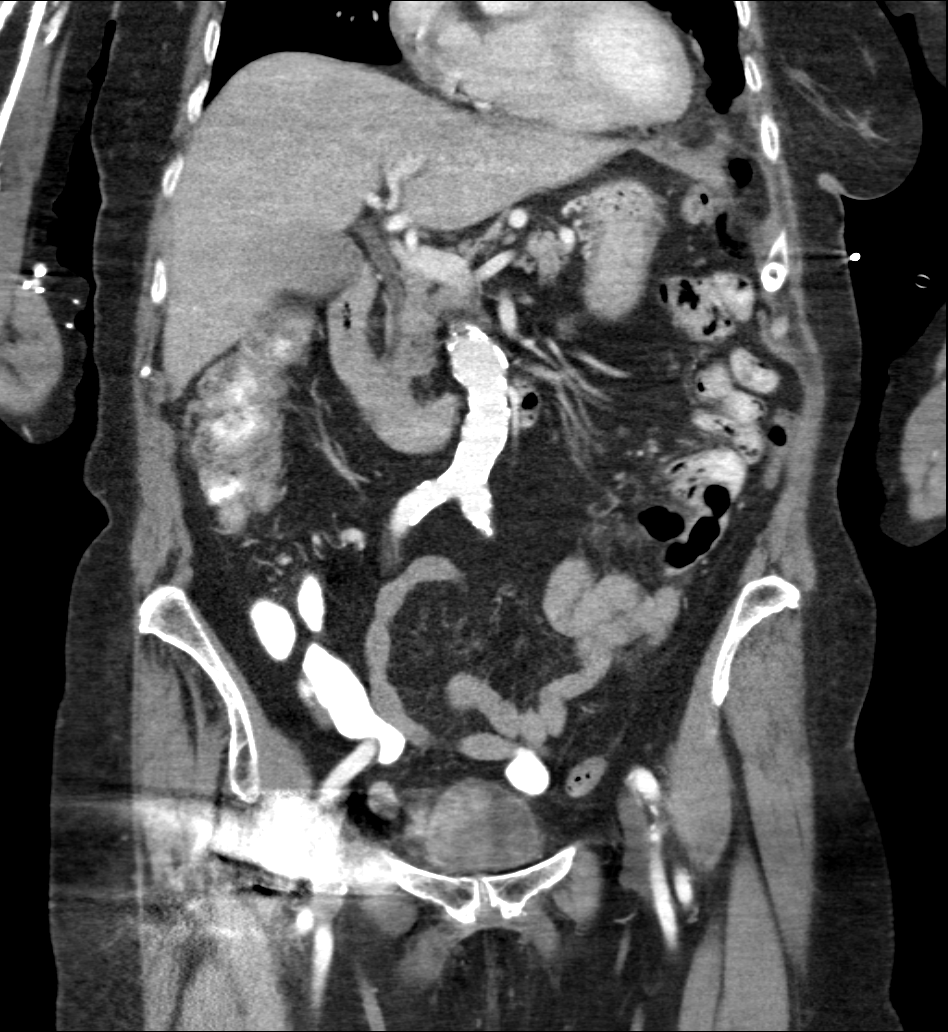
[im 79/142  soft-tissue]
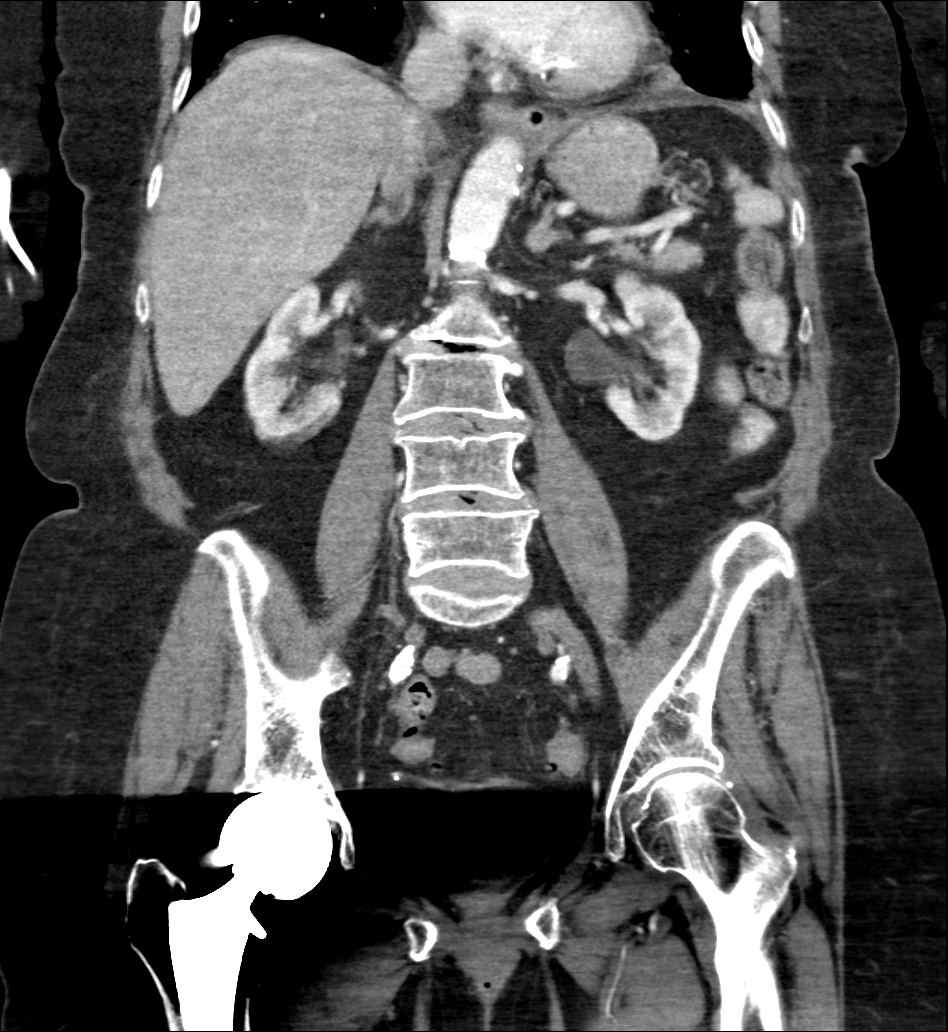

[10 of 46 positions shown; findings below may reference images not displayed]

FINDINGS: Lower chest: Chronic changes are identified at the right lung base.
There is a new left-sided pleural effusion and associated left
basilar atelectasis. Significant coronary artery and mitral
calcification noted.

Upper abdomen: Gallbladder is distended and contains sludge and
stones. No focal abnormality identified within the liver, spleen, or
adrenal glands. Pancreas has a normal appearance. There is symmetric
enhancement and excretion of both kidneys. Small cysts are
identified in the upper pole regions of both kidneys.

Gastrointestinal tract: The stomach and small bowel loops are normal
in appearance. The appendix is well seen and has a normal
appearance. There are numerous colonic diverticula. No acute
diverticulitis.

Pelvis: Small amount of air is identified within the urinary bladder
possibly following recent catheterization. Status post hysterectomy.
No adnexal mass.

Retroperitoneum: There is dense atherosclerotic calcification of the
abdominal aorta. Infrarenal aorta measures 3.0 cm. No evidence for
dissection. No retroperitoneal or mesenteric adenopathy.

Abdominal wall: Unremarkable.

Osseous structures: Right hip arthroplasty. Moderate degenerative
changes in the lumbar spine. No suspicious lytic or blastic lesions
are identified.
IMPRESSION: 1. Interval development of left-sided pleural effusion associated
with left basilar atelectasis.
2. Coronary artery disease and mitral calcification.
3. Sludge and stones within the gallbladder. Consider further
evaluation with abdominal ultrasound.
4. Small renal cysts.
5. Normal appendix.
6. Colonic diverticulosis.
7. Abdominal aortic aneurysm. Recommend followup by ultrasound in 3
years. This recommendation follows ACR consensus guidelines: White
Paper of the ACR Incidental Findings Committee II on Vascular
Findings. [HOSPITAL] 0980; [DATE]

## 2017-01-31 IMAGING — US US ABDOMEN LIMITED
1 series · 14 of 25 positions shown · non-contrast
Comparison: CT body 03/01/2015.

CLINICAL DATA: Abdominal pain.History of gallstones, with
increasing pain.

EXAM:
US ABDOMEN LIMITED - RIGHT UPPER QUADRANT

[Series 1: us abdomen limited · 0.20mm/px · 14 of 36 slices shown]
[im 1/36]
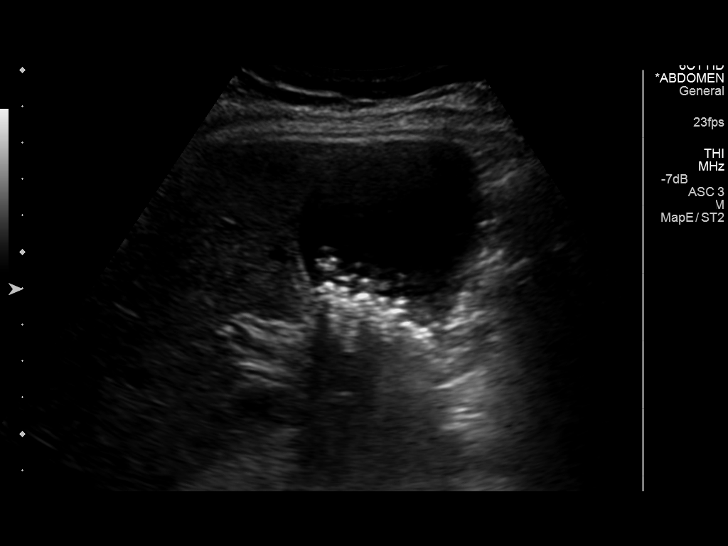
[im 3/36]
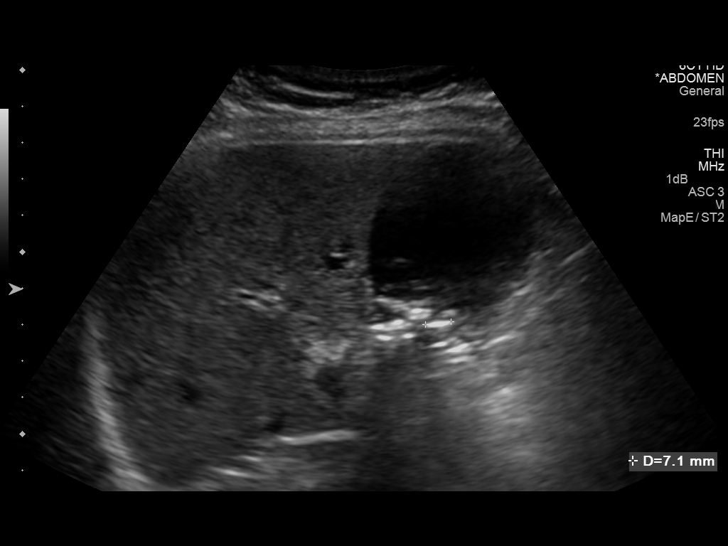
[im 6/36]
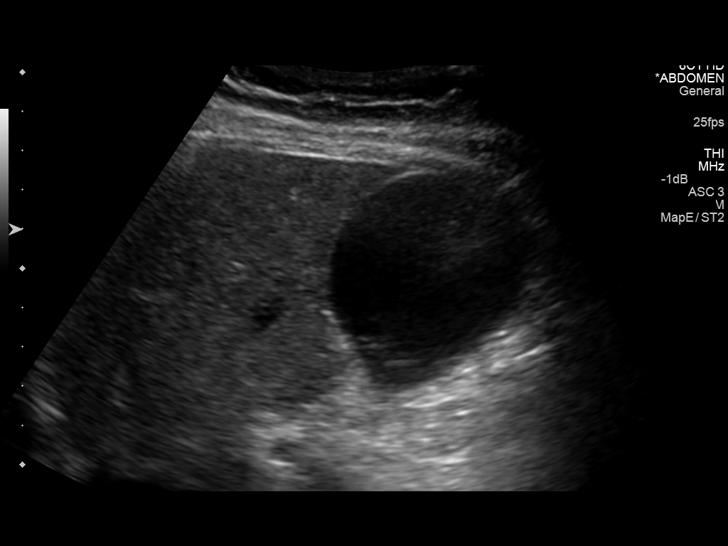
[im 9/36]
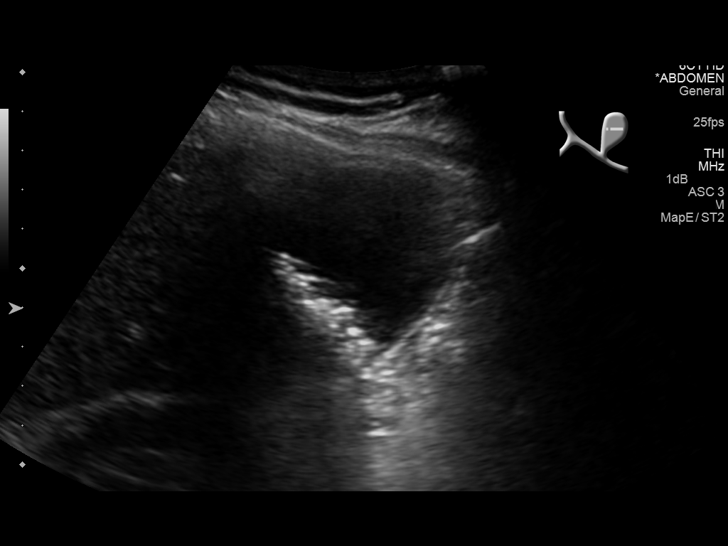
[im 12/36]
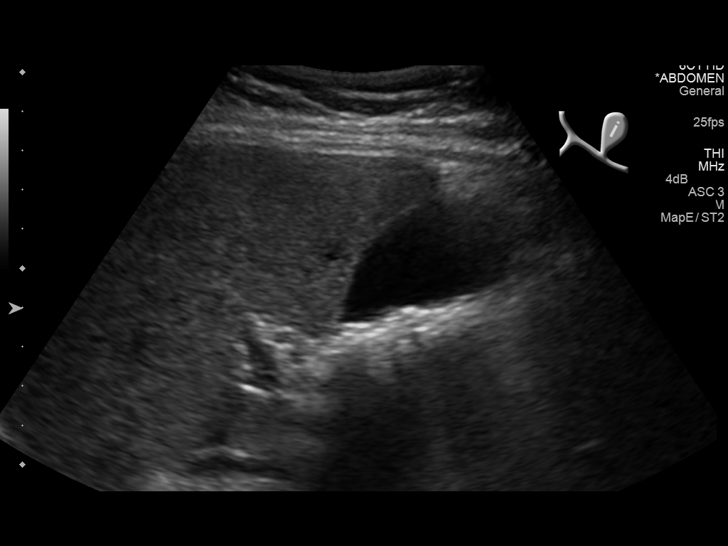
[im 14/36]
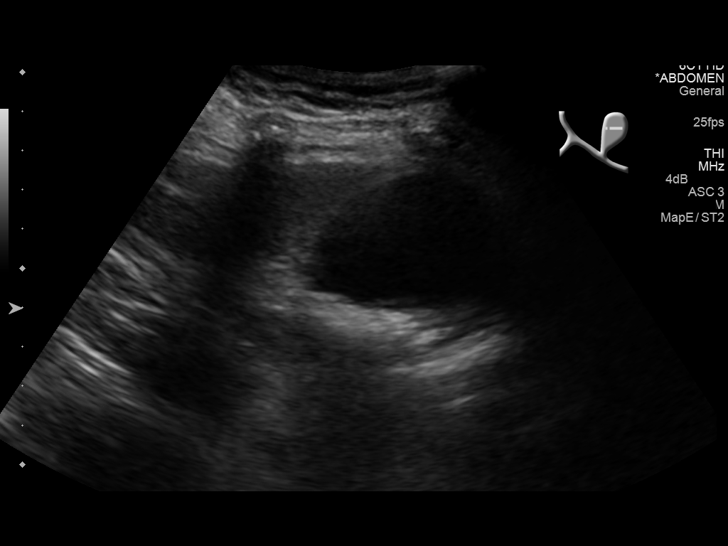
[im 17/36]
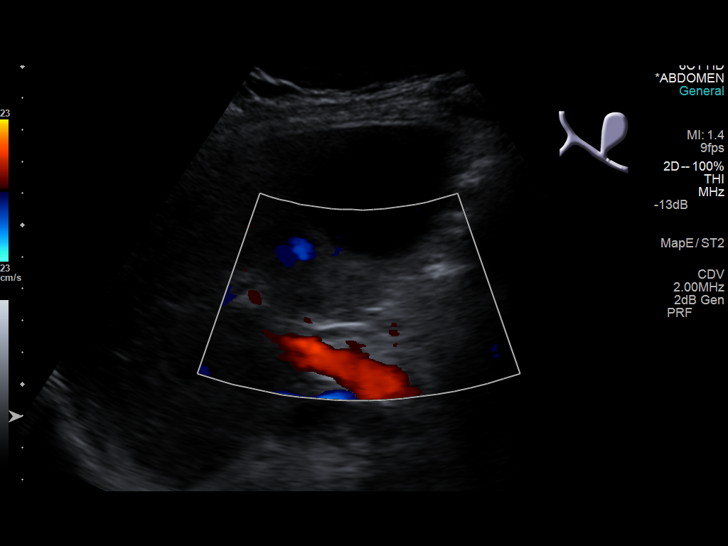
[im 19/36]
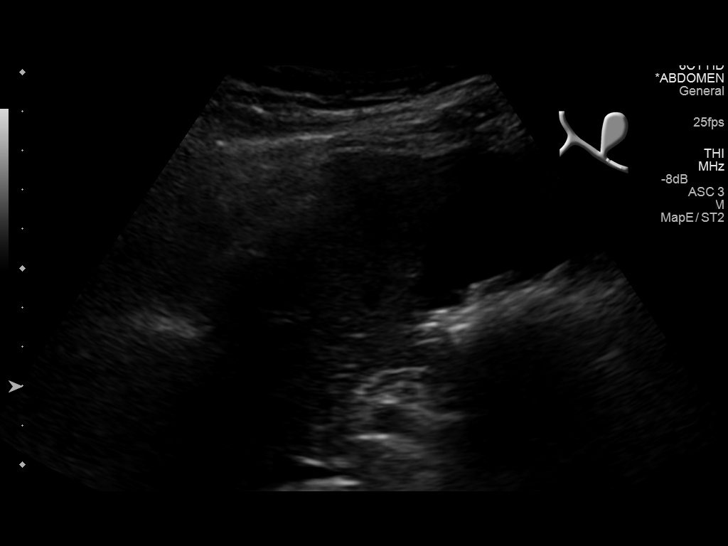
[im 22/36]
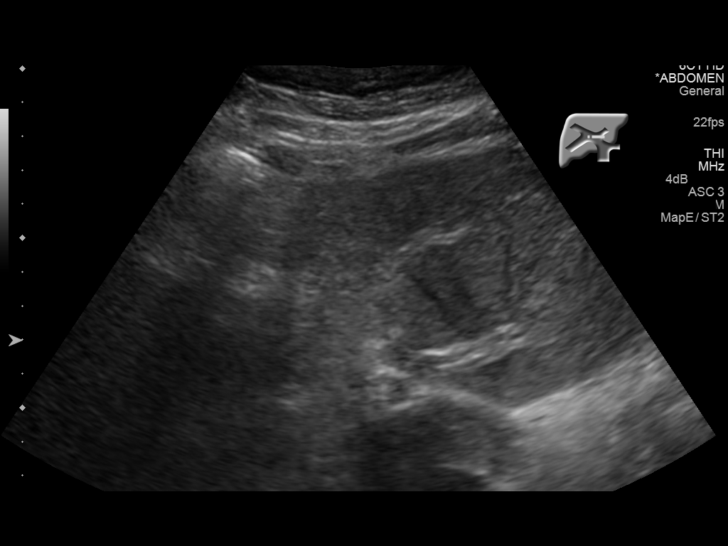
[im 24/36]
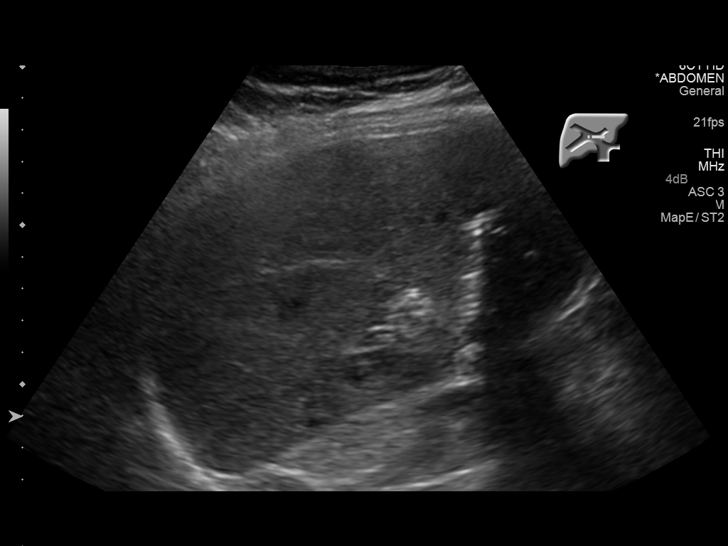
[im 27/36]
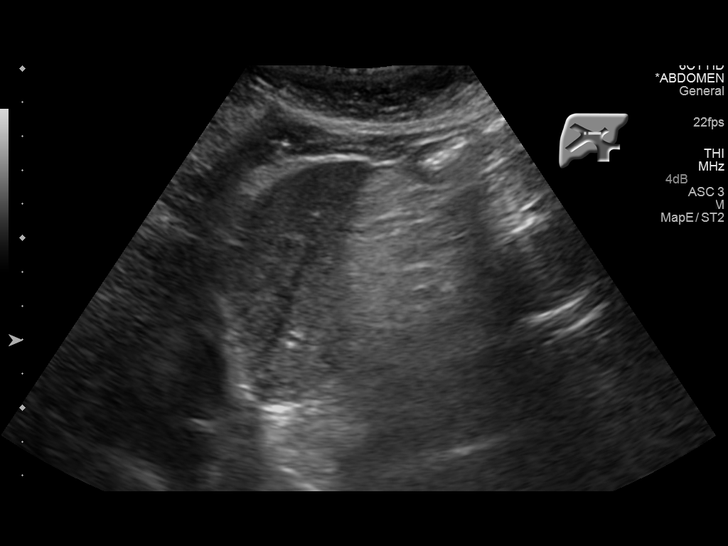
[im 30/36]
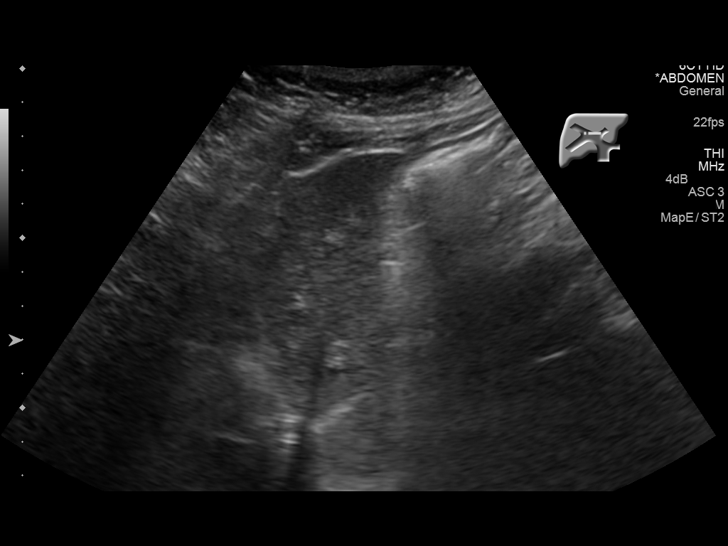
[im 33/36]
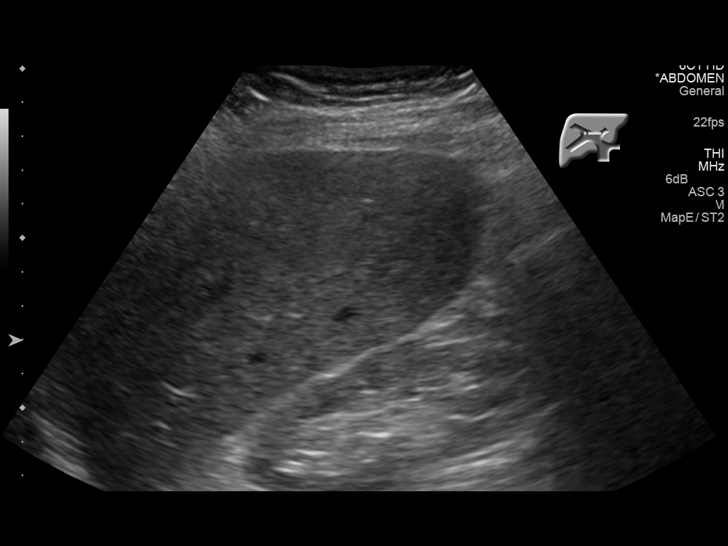
[im 36/36]
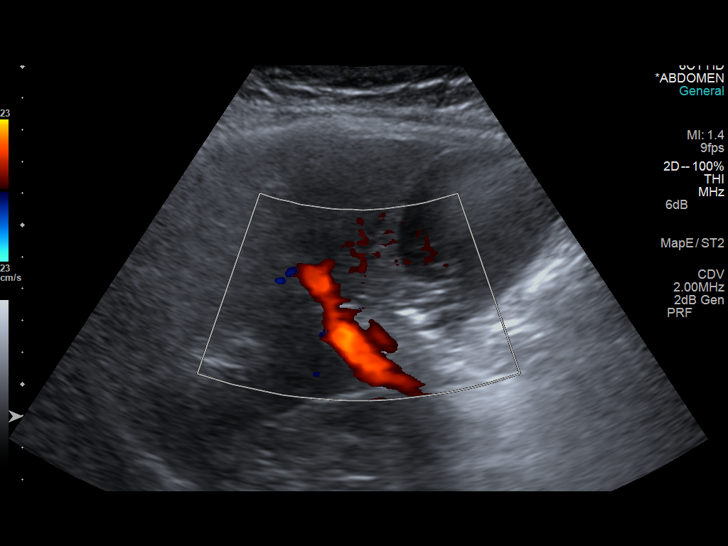

[14 of 25 positions shown; findings below may reference images not displayed]

FINDINGS: Gallbladder:

Multiple layering gallstones. Gallbladder wall thickness 1.6 mm.
Negative sonographic Murphy's sign.

Common bile duct:

Diameter: Within normal limits, measuring 4.5 mm.

Liver:

No focal hepatic abnormalities.
IMPRESSION: Cholelithiasis without signs of acute cholecystitis.
